# Patient Record
Sex: Female | Born: 1935
Health system: Southern US, Community
[De-identification: ages and names within clinical notes are randomized; demographics above are authoritative.]

## PROBLEM LIST (undated history)

## (undated) DIAGNOSIS — N959 Unspecified menopausal and perimenopausal disorder: Secondary | ICD-10-CM

## (undated) DIAGNOSIS — IMO0002 Reserved for concepts with insufficient information to code with codable children: Secondary | ICD-10-CM

## (undated) DIAGNOSIS — E119 Type 2 diabetes mellitus without complications: Secondary | ICD-10-CM

## (undated) DIAGNOSIS — M25519 Pain in unspecified shoulder: Secondary | ICD-10-CM

## (undated) DIAGNOSIS — M545 Low back pain: Secondary | ICD-10-CM

## (undated) DIAGNOSIS — J019 Acute sinusitis, unspecified: Secondary | ICD-10-CM

## (undated) DIAGNOSIS — F411 Generalized anxiety disorder: Secondary | ICD-10-CM

## (undated) DIAGNOSIS — K8020 Calculus of gallbladder without cholecystitis without obstruction: Secondary | ICD-10-CM

## (undated) DIAGNOSIS — K5730 Diverticulosis of large intestine without perforation or abscess without bleeding: Secondary | ICD-10-CM

## (undated) DIAGNOSIS — E785 Hyperlipidemia, unspecified: Secondary | ICD-10-CM

## (undated) DIAGNOSIS — Z86010 Personal history of colonic polyps: Secondary | ICD-10-CM

## (undated) DIAGNOSIS — I639 Cerebral infarction, unspecified: Secondary | ICD-10-CM

## (undated) DIAGNOSIS — I10 Essential (primary) hypertension: Secondary | ICD-10-CM

## (undated) DIAGNOSIS — D509 Iron deficiency anemia, unspecified: Secondary | ICD-10-CM

## (undated) DIAGNOSIS — K219 Gastro-esophageal reflux disease without esophagitis: Secondary | ICD-10-CM

## (undated) DIAGNOSIS — R233 Spontaneous ecchymoses: Secondary | ICD-10-CM

## (undated) DIAGNOSIS — M810 Age-related osteoporosis without current pathological fracture: Secondary | ICD-10-CM

## (undated) DIAGNOSIS — R011 Cardiac murmur, unspecified: Secondary | ICD-10-CM

## (undated) HISTORY — DX: Personal history of colonic polyps: Z86.010

## (undated) HISTORY — DX: Gastro-esophageal reflux disease without esophagitis: K21.9

## (undated) HISTORY — DX: Type 2 diabetes mellitus without complications: E11.9

## (undated) HISTORY — DX: Cardiac murmur, unspecified: R01.1

## (undated) HISTORY — DX: Unspecified menopausal and perimenopausal disorder: N95.9

## (undated) HISTORY — PX: TONSILLECTOMY: SHX5217

## (undated) HISTORY — DX: Diverticulosis of large intestine without perforation or abscess without bleeding: K57.30

## (undated) HISTORY — DX: Generalized anxiety disorder: F41.1

## (undated) HISTORY — DX: Pain in unspecified shoulder: M25.519

## (undated) HISTORY — DX: Low back pain: M54.5

## (undated) HISTORY — DX: Hyperlipidemia, unspecified: E78.5

## (undated) HISTORY — DX: Reserved for concepts with insufficient information to code with codable children: IMO0002

## (undated) HISTORY — DX: Spontaneous ecchymoses: R23.3

## (undated) HISTORY — DX: Iron deficiency anemia, unspecified: D50.9

## (undated) HISTORY — PX: APPENDECTOMY: SHX54

## (undated) HISTORY — DX: Essential (primary) hypertension: I10

## (undated) HISTORY — DX: Calculus of gallbladder without cholecystitis without obstruction: K80.20

## (undated) HISTORY — DX: Cerebral infarction, unspecified: I63.9

## (undated) HISTORY — DX: Age-related osteoporosis without current pathological fracture: M81.0

## (undated) HISTORY — PX: ABDOMINAL HYSTERECTOMY: SHX81

## (undated) HISTORY — DX: Acute sinusitis, unspecified: J01.90

---

## 1978-05-25 HISTORY — PX: OTHER SURGICAL HISTORY: SHX169

## 1983-05-26 HISTORY — PX: OTHER SURGICAL HISTORY: SHX169

## 1993-05-25 HISTORY — PX: OTHER SURGICAL HISTORY: SHX169

## 1998-05-16 ENCOUNTER — Encounter: Payer: Self-pay | Admitting: Internal Medicine

## 1998-05-16 ENCOUNTER — Ambulatory Visit (HOSPITAL_COMMUNITY): Admission: RE | Admit: 1998-05-16 | Discharge: 1998-05-16 | Payer: Self-pay | Admitting: Internal Medicine

## 2004-01-29 ENCOUNTER — Encounter: Payer: Self-pay | Admitting: Gastroenterology

## 2004-03-04 ENCOUNTER — Encounter: Admission: RE | Admit: 2004-03-04 | Discharge: 2004-03-04 | Payer: Self-pay | Admitting: Internal Medicine

## 2004-03-27 ENCOUNTER — Ambulatory Visit: Payer: Self-pay | Admitting: Internal Medicine

## 2004-04-16 ENCOUNTER — Ambulatory Visit: Payer: Self-pay | Admitting: Gastroenterology

## 2004-05-05 ENCOUNTER — Ambulatory Visit: Payer: Self-pay | Admitting: Internal Medicine

## 2004-05-25 LAB — HM MAMMOGRAPHY: HM Mammogram: NORMAL

## 2005-04-22 ENCOUNTER — Ambulatory Visit: Payer: Self-pay | Admitting: Internal Medicine

## 2005-06-03 ENCOUNTER — Ambulatory Visit: Payer: Self-pay | Admitting: Internal Medicine

## 2005-06-11 ENCOUNTER — Encounter: Admission: RE | Admit: 2005-06-11 | Discharge: 2005-06-11 | Payer: Self-pay | Admitting: Internal Medicine

## 2005-07-22 ENCOUNTER — Ambulatory Visit: Payer: Self-pay | Admitting: Internal Medicine

## 2006-08-25 ENCOUNTER — Ambulatory Visit: Payer: Self-pay | Admitting: Internal Medicine

## 2006-08-25 LAB — CONVERTED CEMR LAB
ALT: 17 units/L (ref 0–40)
AST: 15 units/L (ref 0–37)
Albumin: 3.4 g/dL — ABNORMAL LOW (ref 3.5–5.2)
BUN: 14 mg/dL (ref 6–23)
Bacteria, UA: NEGATIVE
Basophils Absolute: 0.1 10*3/uL (ref 0.0–0.1)
Bilirubin, Direct: 0.1 mg/dL (ref 0.0–0.3)
Calcium: 9.2 mg/dL (ref 8.4–10.5)
Chloride: 105 meq/L (ref 96–112)
Direct LDL: 160.9 mg/dL
Eosinophils Absolute: 0.2 10*3/uL (ref 0.0–0.6)
GFR calc Af Amer: 91 mL/min
GFR calc non Af Amer: 76 mL/min
Glucose, Bld: 93 mg/dL (ref 70–99)
HCT: 41.5 % (ref 36.0–46.0)
HDL: 45.8 mg/dL (ref >39.0)
Hgb A1c MFr Bld: 6.5 % — ABNORMAL HIGH (ref 4.6–6.0)
Ketones, ur: NEGATIVE mg/dL
MCHC: 33.7 g/dL (ref 30.0–36.0)
MCV: 89.9 fL (ref 78.0–100.0)
Microalb Creat Ratio: 3.3 mg/g (ref 0.0–30.0)
Neutrophils Relative %: 51.7 % (ref 43.0–77.0)
Platelets: 278 10*3/uL (ref 150–400)
RBC / HPF: NONE SEEN
RBC: 4.61 M/uL (ref 3.87–5.11)
RDW: 12.6 % (ref 11.5–14.6)
Specific Gravity, Urine: 1.02 (ref 1.000–1.03)
Total CHOL/HDL Ratio: 5
Total Protein, Urine: NEGATIVE mg/dL
Triglycerides: 193 mg/dL — ABNORMAL HIGH (ref 0–149)
WBC: 7.1 10*3/uL (ref 4.5–10.5)
pH: 6 (ref 5.0–8.0)

## 2007-01-26 ENCOUNTER — Encounter: Payer: Self-pay | Admitting: Internal Medicine

## 2007-01-26 DIAGNOSIS — I10 Essential (primary) hypertension: Secondary | ICD-10-CM | POA: Insufficient documentation

## 2007-01-26 DIAGNOSIS — K5730 Diverticulosis of large intestine without perforation or abscess without bleeding: Secondary | ICD-10-CM | POA: Insufficient documentation

## 2007-01-26 DIAGNOSIS — Z860100 Personal history of colon polyps, unspecified: Secondary | ICD-10-CM

## 2007-01-26 DIAGNOSIS — K219 Gastro-esophageal reflux disease without esophagitis: Secondary | ICD-10-CM | POA: Insufficient documentation

## 2007-01-26 DIAGNOSIS — IMO0002 Reserved for concepts with insufficient information to code with codable children: Secondary | ICD-10-CM

## 2007-01-26 HISTORY — DX: Essential (primary) hypertension: I10

## 2007-01-26 HISTORY — DX: Personal history of colonic polyps: Z86.010

## 2007-01-26 HISTORY — DX: Diverticulosis of large intestine without perforation or abscess without bleeding: K57.30

## 2007-01-26 HISTORY — DX: Gastro-esophageal reflux disease without esophagitis: K21.9

## 2007-01-26 HISTORY — DX: Personal history of colon polyps, unspecified: Z86.0100

## 2007-01-26 HISTORY — DX: Reserved for concepts with insufficient information to code with codable children: IMO0002

## 2007-01-29 DIAGNOSIS — M545 Low back pain, unspecified: Secondary | ICD-10-CM

## 2007-01-29 HISTORY — DX: Low back pain, unspecified: M54.50

## 2007-08-31 ENCOUNTER — Encounter: Payer: Self-pay | Admitting: Internal Medicine

## 2007-10-06 ENCOUNTER — Encounter (INDEPENDENT_AMBULATORY_CARE_PROVIDER_SITE_OTHER): Payer: Self-pay | Admitting: *Deleted

## 2007-10-06 ENCOUNTER — Ambulatory Visit: Payer: Self-pay | Admitting: Internal Medicine

## 2007-10-06 DIAGNOSIS — E119 Type 2 diabetes mellitus without complications: Secondary | ICD-10-CM

## 2007-10-06 DIAGNOSIS — M25519 Pain in unspecified shoulder: Secondary | ICD-10-CM

## 2007-10-06 DIAGNOSIS — K8020 Calculus of gallbladder without cholecystitis without obstruction: Secondary | ICD-10-CM | POA: Insufficient documentation

## 2007-10-06 DIAGNOSIS — E785 Hyperlipidemia, unspecified: Secondary | ICD-10-CM

## 2007-10-06 DIAGNOSIS — F411 Generalized anxiety disorder: Secondary | ICD-10-CM | POA: Insufficient documentation

## 2007-10-06 HISTORY — DX: Hyperlipidemia, unspecified: E78.5

## 2007-10-06 HISTORY — DX: Generalized anxiety disorder: F41.1

## 2007-10-06 HISTORY — DX: Type 2 diabetes mellitus without complications: E11.9

## 2007-10-06 HISTORY — DX: Calculus of gallbladder without cholecystitis without obstruction: K80.20

## 2007-10-06 HISTORY — DX: Pain in unspecified shoulder: M25.519

## 2007-10-07 LAB — CONVERTED CEMR LAB
ALT: 16 units/L (ref 0–35)
AST: 17 units/L (ref 0–37)
Albumin: 3.6 g/dL (ref 3.5–5.2)
BUN: 12 mg/dL (ref 6–23)
Basophils Relative: 0.8 % (ref 0.0–1.0)
CO2: 32 meq/L (ref 19–32)
Chloride: 105 meq/L (ref 96–112)
Cholesterol: 174 mg/dL (ref 0–200)
Creatinine, Ser: 0.9 mg/dL (ref 0.4–1.2)
Creatinine,U: 22.5 mg/dL
Eosinophils Relative: 2.8 % (ref 0.0–5.0)
Glucose, Bld: 99 mg/dL (ref 70–99)
Hemoglobin: 13.6 g/dL (ref 12.0–15.0)
LDL Cholesterol: 108 mg/dL — ABNORMAL HIGH (ref 0–99)
Leukocytes, UA: NEGATIVE
Lymphocytes Relative: 32.5 % (ref 12.0–46.0)
Monocytes Relative: 10.1 % (ref 3.0–12.0)
Neutro Abs: 3.4 10*3/uL (ref 1.4–7.7)
Neutrophils Relative %: 53.8 % (ref 43.0–77.0)
RBC: 4.47 M/uL (ref 3.87–5.11)
Specific Gravity, Urine: <=1.005 (ref 1.000–1.03)
TSH: 0.91 microintl units/mL (ref 0.35–5.50)
Total Bilirubin: 0.6 mg/dL (ref 0.3–1.2)
Total CHOL/HDL Ratio: 4
Total Protein: 6.8 g/dL (ref 6.0–8.3)
Triglycerides: 111 mg/dL (ref 0–149)
Urine Glucose: NEGATIVE mg/dL
Urobilinogen, UA: 0.2 (ref 0.0–1.0)
WBC: 6.3 10*3/uL (ref 4.5–10.5)
pH: 7 (ref 5.0–8.0)

## 2007-10-23 ENCOUNTER — Encounter: Admission: RE | Admit: 2007-10-23 | Discharge: 2007-10-23 | Payer: Self-pay | Admitting: Internal Medicine

## 2007-11-01 ENCOUNTER — Ambulatory Visit: Payer: Self-pay | Admitting: Gastroenterology

## 2007-11-15 ENCOUNTER — Encounter: Payer: Self-pay | Admitting: Gastroenterology

## 2007-11-15 ENCOUNTER — Ambulatory Visit: Payer: Self-pay | Admitting: Gastroenterology

## 2007-11-15 LAB — HM COLONOSCOPY

## 2007-11-18 ENCOUNTER — Encounter: Payer: Self-pay | Admitting: Gastroenterology

## 2008-03-01 ENCOUNTER — Ambulatory Visit: Payer: Self-pay | Admitting: Internal Medicine

## 2008-04-25 ENCOUNTER — Ambulatory Visit: Payer: Self-pay | Admitting: Internal Medicine

## 2008-04-25 LAB — CONVERTED CEMR LAB
CO2: 33 meq/L — ABNORMAL HIGH (ref 19–32)
Calcium: 9.3 mg/dL (ref 8.4–10.5)
Creatinine, Ser: 1.6 mg/dL — ABNORMAL HIGH (ref 0.4–1.2)
HDL: 47.3 mg/dL (ref >39.0)
Hgb A1c MFr Bld: 6.1 % — ABNORMAL HIGH (ref 4.6–6.0)
Total CHOL/HDL Ratio: 2.8
Triglycerides: 69 mg/dL (ref 0–149)
VLDL: 14 mg/dL (ref 0–40)

## 2008-04-26 ENCOUNTER — Ambulatory Visit: Payer: Self-pay | Admitting: Internal Medicine

## 2008-04-26 DIAGNOSIS — J069 Acute upper respiratory infection, unspecified: Secondary | ICD-10-CM | POA: Insufficient documentation

## 2008-05-25 HISTORY — PX: OTHER SURGICAL HISTORY: SHX169

## 2008-05-28 ENCOUNTER — Ambulatory Visit: Payer: Self-pay | Admitting: Internal Medicine

## 2008-05-28 DIAGNOSIS — J019 Acute sinusitis, unspecified: Secondary | ICD-10-CM

## 2008-05-28 HISTORY — DX: Acute sinusitis, unspecified: J01.90

## 2008-10-11 ENCOUNTER — Emergency Department (HOSPITAL_COMMUNITY): Admission: EM | Admit: 2008-10-11 | Discharge: 2008-10-12 | Payer: Self-pay | Admitting: Emergency Medicine

## 2008-10-15 ENCOUNTER — Encounter: Payer: Self-pay | Admitting: Internal Medicine

## 2008-10-25 ENCOUNTER — Ambulatory Visit: Payer: Self-pay | Admitting: Internal Medicine

## 2008-10-25 LAB — CONVERTED CEMR LAB
ALT: 16 units/L (ref 0–35)
AST: 18 units/L (ref 0–37)
Albumin: 3.6 g/dL (ref 3.5–5.2)
Basophils Relative: 0 % (ref 0.0–3.0)
Bilirubin Urine: NEGATIVE
Creatinine,U: 104 mg/dL
Eosinophils Relative: 2.6 % (ref 0.0–5.0)
GFR calc non Af Amer: 58 mL/min (ref >60)
Glucose, Bld: 82 mg/dL (ref 70–99)
HCT: 33.4 % — ABNORMAL LOW (ref 36.0–46.0)
Hemoglobin: 11.4 g/dL — ABNORMAL LOW (ref 12.0–15.0)
Ketones, ur: NEGATIVE mg/dL
LDL Cholesterol: 58 mg/dL (ref 0–99)
Leukocytes, UA: NEGATIVE
Lymphs Abs: 1.6 10*3/uL (ref 0.7–4.0)
Monocytes Relative: 7.4 % (ref 3.0–12.0)
Neutro Abs: 5.6 10*3/uL (ref 1.4–7.7)
Potassium: 4.6 meq/L (ref 3.5–5.1)
Saturation Ratios: 7.2 % — ABNORMAL LOW (ref 20.0–50.0)
Sodium: 138 meq/L (ref 135–145)
TSH: 1.35 microintl units/mL (ref 0.35–5.50)
Transferrin: 305.5 mg/dL (ref 212.0–360.0)
Urobilinogen, UA: 0.2 (ref 0.0–1.0)
VLDL: 18.2 mg/dL (ref 0.0–40.0)
WBC: 8 10*3/uL (ref 4.5–10.5)
pH: 5 (ref 5.0–8.0)

## 2008-10-26 ENCOUNTER — Ambulatory Visit: Payer: Self-pay | Admitting: Internal Medicine

## 2008-10-26 DIAGNOSIS — D509 Iron deficiency anemia, unspecified: Secondary | ICD-10-CM | POA: Insufficient documentation

## 2008-10-26 DIAGNOSIS — N959 Unspecified menopausal and perimenopausal disorder: Secondary | ICD-10-CM

## 2008-10-26 HISTORY — DX: Unspecified menopausal and perimenopausal disorder: N95.9

## 2008-10-26 HISTORY — DX: Iron deficiency anemia, unspecified: D50.9

## 2008-11-29 ENCOUNTER — Ambulatory Visit: Payer: Self-pay | Admitting: Gastroenterology

## 2008-12-04 ENCOUNTER — Encounter: Payer: Self-pay | Admitting: Gastroenterology

## 2008-12-04 ENCOUNTER — Ambulatory Visit: Payer: Self-pay | Admitting: Gastroenterology

## 2008-12-04 ENCOUNTER — Encounter: Payer: Self-pay | Admitting: Internal Medicine

## 2008-12-04 LAB — CONVERTED CEMR LAB
Basophils Absolute: 0 10*3/uL (ref 0.0–0.1)
Hemoglobin: 11.8 g/dL — ABNORMAL LOW (ref 12.0–15.0)
Lymphocytes Relative: 32 % (ref 12.0–46.0)
Monocytes Relative: 6.7 % (ref 3.0–12.0)
Neutro Abs: 3.2 10*3/uL (ref 1.4–7.7)
Neutrophils Relative %: 57.7 % (ref 43.0–77.0)
RDW: 13.2 % (ref 11.5–14.6)

## 2008-12-06 ENCOUNTER — Ambulatory Visit: Payer: Self-pay | Admitting: Gastroenterology

## 2008-12-06 LAB — CONVERTED CEMR LAB: Fecal Occult Bld: NEGATIVE

## 2009-03-05 ENCOUNTER — Ambulatory Visit: Payer: Self-pay | Admitting: Internal Medicine

## 2009-05-16 ENCOUNTER — Ambulatory Visit: Payer: Self-pay | Admitting: Internal Medicine

## 2009-05-16 LAB — CONVERTED CEMR LAB
BUN: 27 mg/dL — ABNORMAL HIGH (ref 6–23)
Chloride: 105 meq/L (ref 96–112)
Cholesterol: 134 mg/dL (ref 0–200)
HDL: 51.7 mg/dL (ref >39.00)
LDL Cholesterol: 67 mg/dL (ref 0–99)
Potassium: 5.5 meq/L — ABNORMAL HIGH (ref 3.5–5.1)
Sodium: 142 meq/L (ref 135–145)
Triglycerides: 78 mg/dL (ref 0.0–149.0)
VLDL: 15.6 mg/dL (ref 0.0–40.0)

## 2009-05-21 ENCOUNTER — Ambulatory Visit: Payer: Self-pay | Admitting: Internal Medicine

## 2009-11-22 ENCOUNTER — Ambulatory Visit: Payer: Self-pay | Admitting: Internal Medicine

## 2009-11-22 LAB — CONVERTED CEMR LAB
AST: 16 units/L (ref 0–37)
Albumin: 3.5 g/dL (ref 3.5–5.2)
Alkaline Phosphatase: 64 units/L (ref 39–117)
BUN: 21 mg/dL (ref 6–23)
Basophils Absolute: 0 10*3/uL (ref 0.0–0.1)
Bilirubin Urine: NEGATIVE
CO2: 29 meq/L (ref 19–32)
Calcium: 9.4 mg/dL (ref 8.4–10.5)
Creatinine, Ser: 1 mg/dL (ref 0.4–1.2)
Eosinophils Absolute: 0.2 10*3/uL (ref 0.0–0.7)
GFR calc non Af Amer: 61.35 mL/min (ref >60)
Glucose, Bld: 86 mg/dL (ref 70–99)
HDL: 40.1 mg/dL (ref >39.00)
Hemoglobin, Urine: NEGATIVE
Hgb A1c MFr Bld: 6.2 % (ref 4.6–6.5)
Iron: 60 ug/dL (ref 42–145)
Lymphocytes Relative: 34.4 % (ref 12.0–46.0)
MCHC: 33.5 g/dL (ref 30.0–36.0)
Microalb Creat Ratio: 0.7 mg/g (ref 0.0–30.0)
Monocytes Relative: 10 % (ref 3.0–12.0)
Nitrite: NEGATIVE
Platelets: 226 10*3/uL (ref 150.0–400.0)
RDW: 14.7 % — ABNORMAL HIGH (ref 11.5–14.6)
Saturation Ratios: 14.6 % — ABNORMAL LOW (ref 20.0–50.0)
TSH: 1.11 microintl units/mL (ref 0.35–5.50)
Total Bilirubin: 0.3 mg/dL (ref 0.3–1.2)
Total Protein, Urine: NEGATIVE mg/dL
Triglycerides: 121 mg/dL (ref 0.0–149.0)
Urobilinogen, UA: 0.2 (ref 0.0–1.0)
VLDL: 24.2 mg/dL (ref 0.0–40.0)

## 2009-11-29 ENCOUNTER — Ambulatory Visit: Payer: Self-pay | Admitting: Internal Medicine

## 2009-12-02 ENCOUNTER — Ambulatory Visit: Payer: Self-pay | Admitting: Internal Medicine

## 2009-12-17 ENCOUNTER — Encounter: Payer: Self-pay | Admitting: Internal Medicine

## 2009-12-18 ENCOUNTER — Telehealth: Payer: Self-pay | Admitting: Internal Medicine

## 2010-04-21 ENCOUNTER — Telehealth: Payer: Self-pay | Admitting: Internal Medicine

## 2010-06-02 ENCOUNTER — Ambulatory Visit
Admission: RE | Admit: 2010-06-02 | Discharge: 2010-06-02 | Payer: Self-pay | Source: Home / Self Care | Attending: Internal Medicine | Admitting: Internal Medicine

## 2010-06-02 ENCOUNTER — Other Ambulatory Visit: Payer: Self-pay | Admitting: Internal Medicine

## 2010-06-02 LAB — BASIC METABOLIC PANEL
BUN: 29 mg/dL — ABNORMAL HIGH (ref 6–23)
CO2: 28 mEq/L (ref 19–32)
Calcium: 9.4 mg/dL (ref 8.4–10.5)
Chloride: 107 mEq/L (ref 96–112)
Creatinine, Ser: 1 mg/dL (ref 0.4–1.2)
GFR: 59.1 mL/min — ABNORMAL LOW (ref >60.00)
Glucose, Bld: 86 mg/dL (ref 70–99)
Potassium: 6 mEq/L — ABNORMAL HIGH (ref 3.5–5.1)
Sodium: 143 mEq/L (ref 135–145)

## 2010-06-02 LAB — LIPID PANEL
Cholesterol: 151 mg/dL (ref 0–200)
HDL: 46.1 mg/dL (ref >39.00)
LDL Cholesterol: 86 mg/dL (ref 0–99)
Total CHOL/HDL Ratio: 3
Triglycerides: 96 mg/dL (ref 0.0–149.0)
VLDL: 19.2 mg/dL (ref 0.0–40.0)

## 2010-06-02 LAB — HEMOGLOBIN A1C: Hgb A1c MFr Bld: 6.3 % (ref 4.6–6.5)

## 2010-06-04 ENCOUNTER — Ambulatory Visit
Admission: RE | Admit: 2010-06-04 | Discharge: 2010-06-04 | Payer: Self-pay | Source: Home / Self Care | Attending: Internal Medicine | Admitting: Internal Medicine

## 2010-06-04 DIAGNOSIS — M810 Age-related osteoporosis without current pathological fracture: Secondary | ICD-10-CM

## 2010-06-04 DIAGNOSIS — R233 Spontaneous ecchymoses: Secondary | ICD-10-CM | POA: Insufficient documentation

## 2010-06-04 HISTORY — DX: Age-related osteoporosis without current pathological fracture: M81.0

## 2010-06-04 HISTORY — DX: Spontaneous ecchymoses: R23.3

## 2010-06-05 ENCOUNTER — Telehealth: Payer: Self-pay | Admitting: Internal Medicine

## 2010-06-10 ENCOUNTER — Encounter: Payer: Self-pay | Admitting: Internal Medicine

## 2010-06-15 ENCOUNTER — Encounter: Payer: Self-pay | Admitting: Internal Medicine

## 2010-06-24 NOTE — Progress Notes (Signed)
Summary: Prolia approval  Phone Note Outgoing Call Call back at Home Phone [phone]   Call placed by: Lucious Groves CMA,  December 18, 2009 11:55 AM Call placed to: Patient Summary of Call: Prolia has been approved and patient responsibility is 20% (approx $175) and $10 office visit copay. Patient notified and would like to do some research/discuss with a friend. She will call back for appt. Initial call taken by: Lucious Groves CMA,  December 18, 2009 11:55 AM  Follow-up for Phone Call        noted Follow-up by: [name] MD,  December 18, 2009 12:59 PM

## 2010-06-24 NOTE — Assessment & Plan Note (Signed)
Summary: 6 MOS F/U #/CD   RS'D PER PT/NWS   Vital Signs:  Patient profile:   76 year old female Height:      63 inches Weight:      215 pounds BMI:     38.22 O2 Sat:      96 % on Room air Temp:     96.6 degrees F oral Pulse rate:   77 / minute BP sitting:   116 / 68  (left arm) Cuff size:   large  Vitals Entered By: Bill Salinas CMA (November 29, 2009 1:37 PM)  O2 Flow:  Room air   Primary Care Provider:  Oliver Barre, MD   History of Present Illness: overall doing well;  Pt denies CP, sob, doe, wheezing, orthopnea, pnd, worsening LE edema, palps, dizziness or syncope Pt denies new neuro symptoms such as headache, facial or extremity weakness   Pt denies polydipsia, polyuria, or low sugar symptoms such as shakiness improved with eating.  Overall good compliance with meds, trying to follow low chol, DM diet, wt stable, little excercise however   Problems Prior to Update: 1)  Menopausal Disorder  (ICD-627.9) 2)  Anemia-iron Deficiency  (ICD-280.9) 3)  Acute Sinusitis, Unspecified  (ICD-461.9) 4)  Uri  (ICD-465.9) 5)  Shoulder Pain, Left  (ICD-719.41) 6)  Diabetes Mellitus, Type II  (ICD-250.00) 7)  Preventive Health Care  (ICD-V70.0) 8)  Anxiety  (ICD-300.00) 9)  Hyperlipidemia  (ICD-272.4) 10)  Cholelithiasis  (ICD-574.20) 11)  Low Back Pain  (ICD-724.2) 12)  Herniated Disc  (ICD-722.2) 13)  Gerd  (ICD-530.81) 14)  Diverticulosis, Colon  (ICD-562.10) 15)  Colonic Polyps, Hx of  (ICD-V12.72) 16)  Hypertension  (ICD-401.9)  Medications Prior to Update: 1)  Simvastatin 80 Mg  Tabs (Simvastatin) .Marland Kitchen.. 1 By Mouth Once Daily 2)  Actoplus Met 15-500 Mg  Tabs (Pioglitazone Hcl-Metformin Hcl) .Marland Kitchen.. 1 By Mouth Two Times A Day 3)  Lisinopril-Hydrochlorothiazide 20-12.5 Mg  Tabs (Lisinopril-Hydrochlorothiazide) .... 2 By Mouth Once Daily 4)  Adult Aspirin Ec Low Strength 81 Mg  Tbec (Aspirin) .Marland Kitchen.. 1 By Mouth Qd 5)  Calcium 1200-1000 Mg-Unit Chew (Calcium Carbonate-Vit D-Min) .... One  Tablet By Mouth Once Daily 6)  Vitamin C 1000 Mg Tabs (Ascorbic Acid) .... One Tablet By Mouth Once Daily 7)  Vicodin 5-500 Mg Tabs (Hydrocodone-Acetaminophen) .Marland Kitchen.. 1 By Mouth Every 4-6 Hours As Needed 8)  Pantoprazole Sodium 40 Mg Tbec (Pantoprazole Sodium) .Marland Kitchen.. 1po Once Daily 9)  Temazepam 15 Mg Caps (Temazepam) .... One Tablet By Mouth Once Daily 10)  Iron 325 (65 Fe) Mg Tabs (Ferrous Sulfate) .... One Tablet By Mouth Once Daily  Current Medications (verified): 1)  Simvastatin 40 Mg Tabs (Simvastatin) .Marland Kitchen.. 1 By Mouth Once Daily 2)  Actoplus Met 15-500 Mg  Tabs (Pioglitazone Hcl-Metformin Hcl) .Marland Kitchen.. 1 By Mouth Two Times A Day 3)  Lisinopril-Hydrochlorothiazide 20-12.5 Mg  Tabs (Lisinopril-Hydrochlorothiazide) .... 2 By Mouth Once Daily 4)  Adult Aspirin Ec Low Strength 81 Mg  Tbec (Aspirin) .Marland Kitchen.. 1 By Mouth Qd 5)  Calcium 1200-1000 Mg-Unit Chew (Calcium Carbonate-Vit D-Min) .... One Tablet By Mouth Once Daily 6)  Temazepam 15 Mg Caps (Temazepam) .... One Tablet By Mouth Once Daily  Allergies (verified): 1)  Pcn 2)  Morphine  Past History:  Past Medical History: Last updated: 11/29/2008 Hypertension Colonic polyps, hx of Diverticulosis, colon GERD Herniated Disc- C Spine and Lumbar Low back pain Cholelithiasis Hyperlipidemia c-spine disc disease lumbar disc disease Anxiety Diabetes mellitus, type II Anemia-iron  deficiency  Past Surgical History: Last updated: 05/21/2009 Hysterectomy Appendectomy Tonsillectomy L Elbow- Nerve Impingement- 1985 L Ankle- Screw Placement- 1980 Herniated Disk- 1995 - c-spine s/p fracture left wrist 2010 - dr Amanda Pea  Family History: Last updated: 11/29/2008 ETOH dependence elev cholesterol heart disease: Father  stroke HTN DM: Multiple family members brother died pneumonia No FH of Colon Cancer:  Social History: Last updated: 11/29/2008 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   Risk Factors: Exercise: no (11/29/2008)  Risk Factors: Smoking Status: current (05/28/2008) Packs/Day: 1/2 (05/28/2008) Cans of tobacco/wk: no (05/28/2008) Passive Smoke Exposure: yes (05/28/2008)  Review of Systems  The patient denies anorexia, fever, vision loss, decreased hearing, hoarseness, chest pain, syncope, dyspnea on exertion, peripheral edema, prolonged cough, headaches, hemoptysis, abdominal pain, melena, hematochezia, severe indigestion/heartburn, hematuria, muscle weakness, suspicious skin lesions, transient blindness, difficulty walking, depression, unusual weight change, abnormal bleeding, enlarged lymph nodes, and angioedema.         all otherwise negative per pt -  except for now chronic left grip strength slight loss after preovious fracture arm  Physical Exam  General:  alert and overweight-appearing.   Head:  normocephalic and atraumatic.   Eyes:  vision grossly intact, pupils equal, and pupils round.   Ears:  R ear normal and L ear normal.   Nose:  no external deformity and no nasal discharge.   Mouth:  no gingival abnormalities and pharynx pink and moist.   Neck:  supple and no masses.   Lungs:  normal respiratory effort and normal breath sounds.   Heart:  normal rate and regular rhythm.   Abdomen:  soft, non-tender, and normal bowel sounds.   Msk:  no joint tenderness and no joint swelling.   Extremities:  no edema, no erythema  Neurologic:  cranial nerves II-XII intact and strength normal in all extremities.   Skin:  color normal and no rashes.   Psych:  not depressed appearing and slightly anxious.     Impression & Recommendations:  Problem # 1:  Preventive Health Care (ICD-V70.0) Overall doing well, age appropriate education and counseling updated and referral for appropriate preventive services done unless declined, immunizations up to date or declined, diet counseling done if  overweight, urged to quit smoking if smokes , most recent labs reviewed and current ordered if appropriate, ecg reviewed or declined (interpretation per ECG scanned in the EMR if done); information regarding Medicare Prevention requirements given if appropriate; speciality referrals updated as appropriate   Problem # 2:  DIABETES MELLITUS, TYPE II (ICD-250.00)  Her updated medication list for this problem includes:    Actoplus Met 15-500 Mg Tabs (Pioglitazone hcl-metformin hcl) .Marland Kitchen... 1 by mouth two times a day    Lisinopril-hydrochlorothiazide 20-12.5 Mg Tabs (Lisinopril-hydrochlorothiazide) .Marland Kitchen... 2 by mouth once daily    Adult Aspirin Ec Low Strength 81 Mg Tbec (Aspirin) .Marland Kitchen... 1 by mouth qd  Labs Reviewed: Creat: 1.0 (11/22/2009)    Reviewed HgBA1c results: 6.2 (11/22/2009)  6.2 (05/16/2009) stable overall by hx and exam, ok to continue meds/tx as is   Problem # 3:  HYPERLIPIDEMIA (ICD-272.4)  Her updated medication list for this problem includes:    Simvastatin 40 Mg Tabs (Simvastatin) .Marland Kitchen... 1 by mouth once daily  Labs Reviewed: SGOT: 16 (11/22/2009)   SGPT: 10 (11/22/2009)   HDL:40.10 (11/22/2009), 51.70 (05/16/2009)  LDL:67 (11/22/2009), 67 (05/16/2009)  Chol:131 (11/22/2009), 134 (05/16/2009)  Trig:121.0 (11/22/2009), 78.0 (05/16/2009) stable overall by hx and exam, ok to continue meds/tx as is , but to decr the zocor to 40 mg due to recent FDA warning about the 80 mg  Problem # 4:  HYPERTENSION (ICD-401.9)  Her updated medication list for this problem includes:    Lisinopril-hydrochlorothiazide 20-12.5 Mg Tabs (Lisinopril-hydrochlorothiazide) .Marland Kitchen... 2 by mouth once daily  BP today: 116/68 Prior BP: 130/70 (05/21/2009)  Labs Reviewed: K+: 5.7 (11/22/2009) Creat: : 1.0 (11/22/2009)   Chol: 131 (11/22/2009)   HDL: 40.10 (11/22/2009)   LDL: 67 (11/22/2009)   TG: 121.0 (11/22/2009) stable overall by hx and exam, ok to continue meds/tx as is   Complete Medication List: 1)   Simvastatin 40 Mg Tabs (Simvastatin) .Marland Kitchen.. 1 by mouth once daily 2)  Actoplus Met 15-500 Mg Tabs (Pioglitazone hcl-metformin hcl) .Marland Kitchen.. 1 by mouth two times a day 3)  Lisinopril-hydrochlorothiazide 20-12.5 Mg Tabs (Lisinopril-hydrochlorothiazide) .... 2 by mouth once daily 4)  Adult Aspirin Ec Low Strength 81 Mg Tbec (Aspirin) .Marland Kitchen.. 1 by mouth qd 5)  Calcium 1200-1000 Mg-unit Chew (Calcium carbonate-vit d-min) .... One tablet by mouth once daily 6)  Temazepam 15 Mg Caps (Temazepam) .... One tablet by mouth once daily  Other Orders: T-Bone Densitometry [id number])  Patient Instructions: 1)  decrease the simvastatin to 40 mg per day 2)  Continue all previous medications as before this visit  3)  please schedule your bone density test before leaving today 4)  Please schedule a follow-up appointment in 6 months  with: 5)  BMP prior to visit, ICD-9: 250.02 6)  Lipid Panel prior to visit, ICD-9: 7)  HbgA1C prior to visit, ICD-9: Prescriptions: SIMVASTATIN 40 MG TABS (SIMVASTATIN) 1 by mouth once daily  #90 x 3   Entered and Authorized by:   [name] MD   Signed by:   [name] MD on 11/29/2009   Method used:   Print then Give to Patient   RxID:   [id number]

## 2010-06-24 NOTE — Progress Notes (Signed)
  Phone Note Refill Request Message from:  Fax from Pharmacy on April 21, 2010 4:55 PM  Refills Requested: Medication #1:  ACTOPLUS MET 15-500 MG  TABS 1 by mouth two times a day   Dosage confirmed as above?Dosage Confirmed   Last Refilled: 10/2009   Notes: [facility]  Medication #2:  LISINOPRIL-HYDROCHLOROTHIAZIDE 20-12.5 MG  TABS 2 by mouth once daily   Dosage confirmed as above?Dosage Confirmed   Last Refilled: 10/2009   Notes: [facility] Initial call taken by: Robin Ewing CMA (AAMA),  April 21, 2010 4:55 PM    Prescriptions: LISINOPRIL-HYDROCHLOROTHIAZIDE 20-12.5 MG  TABS (LISINOPRIL-HYDROCHLOROTHIAZIDE) 2 by mouth once daily  #180 Tablet x 1   Entered by:   Scharlene Gloss CMA (AAMA)   Authorized by:   [name] MD   Signed by:   Scharlene Gloss CMA (AAMA) on 04/21/2010   Method used:   Faxed to ...       [facility] [phone]* (retail)       [address] [place], [place]  [address]       Ph: [phone]       Fax: [phone]   RxID:   [id number] ACTOPLUS MET 15-500 MG  TABS (PIOGLITAZONE HCL-METFORMIN HCL) 1 by mouth two times a day  #180 Tablet x 1   Entered by:   Scharlene Gloss CMA (AAMA)   Authorized by:   [name] MD   Signed by:   Scharlene Gloss CMA (AAMA) on 04/21/2010   Method used:   Faxed to ...       [facility] [phone]* (retail)       [address] [place], [place]  [address]       Ph: [phone]       Fax: [phone]   RxID:   [id number]

## 2010-06-24 NOTE — Medication Information (Signed)
Summary: Ins verification for Prolia/ProliaPlus  Ins verification for Prolia/ProliaPlus   Imported By: Sherian Rein 12/19/2009 15:01:13  _____________________________________________________________________  External Attachment:    Type:   Image     Comment:   External Document

## 2010-06-24 NOTE — Miscellaneous (Signed)
Summary: BONE DENSITY  Clinical Lists Changes  Orders: Added new Test order of T-Lumbar Vertebral Assessment (77082) - Signed 

## 2010-06-26 NOTE — Medication Information (Signed)
Summary: Benefits/Prolia  Benefits/Prolia   Imported By: Lester [place] 06/17/2010 12:23:40  _____________________________________________________________________  External Attachment:    Type:   Image     Comment:   External Document

## 2010-06-26 NOTE — Assessment & Plan Note (Signed)
Summary: 6 MO ROV /NWS  #   Vital Signs:  Patient profile:   76 year old female Height:      62 inches Weight:      214 pounds BMI:     39.28 O2 Sat:      92 % on Room air Temp:     98.3 degrees F oral Pulse rate:   85 / minute BP sitting:   122 / 70  (left arm) Cuff size:   large  Vitals Entered By: Zella Ball Ewing CMA (AAMA) (June 04, 2010 11:07 AM)  O2 Flow:  Room air CC: 6 month ROV/RE   Primary Care Provider:  Oliver Barre, MD  CC:  6 month ROV/RE.  History of Present Illness: here to f/u; overall doing ok;  Pt denies CP, worsening sob, doe, wheezing, orthopnea, pnd, worsening LE edema, palps, dizziness or syncope  Pt denies new neuro symptoms such as headache, facial or extremity weakness  Pt denies polydipsia, polyuria, or low sugar symptoms such as shakiness improved with eating.  Overall good compliance with meds, trying to follow low chol, DM diet, wt stable, little excercise however  CBG's in  the lower 100's.  No fever, wt loss, night sweats, loss of appetite or other constitutional symptoms  Overall good compliance with meds, and good tolerability.  Denies worsening depressive symptoms, suicidal ideation, or panic.   Does also want to d/w pt most recent dxa as she was not sure about starting the prolia.  Problems Prior to Update: 1)  Osteoporosis  (ICD-733.00) 2)  Ecchymoses, Spontaneous  (ICD-782.7) 3)  Menopausal Disorder  (ICD-627.9) 4)  Anemia-iron Deficiency  (ICD-280.9) 5)  Acute Sinusitis, Unspecified  (ICD-461.9) 6)  Uri  (ICD-465.9) 7)  Shoulder Pain, Left  (ICD-719.41) 8)  Diabetes Mellitus, Type II  (ICD-250.00) 9)  Preventive Health Care  (ICD-V70.0) 10)  Anxiety  (ICD-300.00) 11)  Hyperlipidemia  (ICD-272.4) 12)  Cholelithiasis  (ICD-574.20) 13)  Low Back Pain  (ICD-724.2) 14)  Herniated Disc  (ICD-722.2) 15)  Gerd  (ICD-530.81) 16)  Diverticulosis, Colon  (ICD-562.10) 17)  Colonic Polyps, Hx of  (ICD-V12.72) 18)  Hypertension   (ICD-401.9)  Medications Prior to Update: 1)  Simvastatin 40 Mg Tabs (Simvastatin) .Marland Kitchen.. 1 By Mouth Once Daily 2)  Actoplus Met 15-500 Mg  Tabs (Pioglitazone Hcl-Metformin Hcl) .Marland Kitchen.. 1 By Mouth Two Times A Day 3)  Lisinopril-Hydrochlorothiazide 20-12.5 Mg  Tabs (Lisinopril-Hydrochlorothiazide) .... 2 By Mouth Once Daily 4)  Adult Aspirin Ec Low Strength 81 Mg  Tbec (Aspirin) .Marland Kitchen.. 1 By Mouth Qd 5)  Calcium 1200-1000 Mg-Unit Chew (Calcium Carbonate-Vit D-Min) .... One Tablet By Mouth Once Daily 6)  Temazepam 15 Mg Caps (Temazepam) .... One Tablet By Mouth Once Daily  Current Medications (verified): 1)  Lipitor 40 Mg Tabs (Atorvastatin Calcium) .Marland Kitchen.. 1 By Mouth Once Daily 2)  Actoplus Met 15-500 Mg  Tabs (Pioglitazone Hcl-Metformin Hcl) .Marland Kitchen.. 1 By Mouth Two Times A Day 3)  Lisinopril-Hydrochlorothiazide 20-12.5 Mg  Tabs (Lisinopril-Hydrochlorothiazide) .... 2 By Mouth Once Daily 4)  Adult Aspirin Ec Low Strength 81 Mg  Tbec (Aspirin) .Marland Kitchen.. 1 By Mouth Qd 5)  Calcium 1200-1000 Mg-Unit Chew (Calcium Carbonate-Vit D-Min) .... One Tablet By Mouth Once Daily 6)  Temazepam 15 Mg Caps (Temazepam) .... 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   Risk Factors: Exercise: no (11/29/2008)  Risk Factors: Smoking Status: current (05/28/2008) Packs/Day: 1/2 (05/28/2008) Cans of tobacco/wk: no (05/28/2008) Passive Smoke Exposure: yes (05/28/2008)  Past Medical History: Hypertension Colonic polyps, hx of Diverticulosis,  colon GERD Herniated Disc- C Spine and Lumbar Low back pain Cholelithiasis Hyperlipidemia c-spine disc disease lumbar disc disease Anxiety Diabetes mellitus, type II Anemia-iron deficiency Osteoporosis  Review of Systems       all otherwise negative per pt -    Physical Exam  General:  alert and overweight-appearing.   Head:  normocephalic and atraumatic.   Eyes:  vision grossly intact, pupils equal, and pupils round.   Ears:  R ear normal and L ear normal.   Nose:  no external deformity and no nasal discharge.   Mouth:  no gingival abnormalities and pharynx pink and moist.   Neck:  supple and no masses.   Lungs:  normal respiratory effort and normal breath sounds.   Heart:  normal rate and regular rhythm.   Msk:  no joint tenderness and no joint swelling.   Extremities:  no edema, no erythema  Neurologic:  cranial nerves II-XII intact and strength normal in all extremities.     Impression & Recommendations:  Problem # 1:  HYPERTENSION (ICD-401.9)  Her updated medication list for this problem includes:    Lisinopril-hydrochlorothiazide 20-12.5 Mg Tabs (Lisinopril-hydrochlorothiazide) .Marland Kitchen... 2 by mouth once daily  BP today: 122/70 Prior BP: 116/68 (11/29/2009)  Labs Reviewed: K+: 6.0 (06/02/2010) Creat: : 1.0 (06/02/2010)   Chol: 151 (06/02/2010)   HDL: 46.10 (06/02/2010)   LDL: 86 (06/02/2010)   TG: 96.0 (06/02/2010) stable overall by hx and exam, ok to continue meds/tx as is   Problem # 2:  DIABETES MELLITUS, TYPE II (ICD-250.00)  Her updated medication list for this problem includes:    Actoplus Met 15-500 Mg Tabs (Pioglitazone hcl-metformin hcl) .Marland Kitchen... 1 by mouth two times a day    Lisinopril-hydrochlorothiazide 20-12.5 Mg Tabs (Lisinopril-hydrochlorothiazide) .Marland Kitchen... 2 by mouth once daily    Adult Aspirin Ec Low Strength 81 Mg Tbec (Aspirin) .Marland Kitchen... 1 by mouth qd  Labs Reviewed: Creat: 1.0 (06/02/2010)    Reviewed HgBA1c results: 6.3 (06/02/2010)  6.2  (11/22/2009) stable overall by hx and exam, ok to continue meds/tx as is , Pt to cont DM diet, excercise, wt control efforts; to check labs again next visit  Problem # 3:  HYPERLIPIDEMIA (ICD-272.4)  Her updated medication list for this problem includes:    Lipitor 40 Mg Tabs (Atorvastatin calcium) .Marland Kitchen... 1 by mouth once daily  Labs Reviewed: SGOT: 16 (11/22/2009)   SGPT: 10 (11/22/2009)   HDL:46.10 (06/02/2010), 40.10 (11/22/2009)  LDL:86 (06/02/2010), 67 (11/22/2009)  Chol:151 (06/02/2010), 131 (11/22/2009)  Trig:96.0 (06/02/2010), 121.0 (11/22/2009) d/w pt ; goal ldl < 70 ; pt is close but ok to change to generic lipitor 40 once daily , Pt to continue diet efforts, f/u labs next visit  Problem # 4:  OSTEOPOROSIS (ICD-733.00) d/w pt and reivewed most recent dxa - will re-check on the prolia copay for 2012 as she is interested, o/w may need to consider fosamax, to also cont activity., calcium , and to start vit d 2000 units per day  Complete Medication List: 1)  Lipitor  40 Mg Tabs (Atorvastatin calcium) .Marland Kitchen.. 1 by mouth once daily 2)  Actoplus Met 15-500 Mg Tabs (Pioglitazone hcl-metformin hcl) .Marland Kitchen.. 1 by mouth two times a day 3)  Lisinopril-hydrochlorothiazide 20-12.5 Mg Tabs (Lisinopril-hydrochlorothiazide) .... 2 by mouth once daily 4)  Adult Aspirin Ec Low Strength 81 Mg Tbec (Aspirin) .Marland Kitchen.. 1 by mouth qd 5)  Calcium 1200-1000 Mg-unit Chew (Calcium carbonate-vit d-min) .... One tablet by mouth once daily 6)  Temazepam 15 Mg Caps (Temazepam) .... One tablet by mouth once daily  Patient Instructions: 1)  stop the simvastatin 2)  start the generic for lipitor 40 mg per day 3)  you should get a call in 1-2 wks about the copay fo the prolia for 2012 4)  Continue all previous medications as before this visit  5)  Please schedule a follow-up appointment in 6 months for CPX with labs and: 6)  HbgA1C prior to visit, ICD-9: 250.02 7)  Urine Microalbumin prior to visit,  ICD-9: Prescriptions: LIPITOR 40 MG TABS (ATORVASTATIN CALCIUM) 1 by mouth once daily  #90 x 3   Entered and Authorized by:   [name] MD   Signed by:   [name] MD on 06/04/2010   Method used:   Electronically to        [facility] [phone]* (retail)       [address] [place], [place]  [address]       Ph: [phone]       Fax: [phone]   RxID:   [id number]    Orders Added: 1)  Est. Patient Level IV [30160]

## 2010-06-27 NOTE — Medication Information (Signed)
Summary: ProliaPlus  ProliaPlus   Imported By: Lester Powell 12/18/2009 10:22:36  _____________________________________________________________________  External Attachment:    Type:   Image     Comment:   External Document

## 2010-07-03 ENCOUNTER — Encounter: Payer: Self-pay | Admitting: Internal Medicine

## 2010-07-10 NOTE — Letter (Signed)
Summary: Generic Letter  [facility]  [address] [place], [place] [address]   Phone: [phone]  Fax: [phone]    07/03/2010  Kathy Thomas [address] [place], [place]  [address]  Dear Kathy Thomas,  We have been trying to contact you regarding Prolia. Prolia is the medication Dr. Jonny Ruiz wanted [place] to verify whether it was covered by your insurance. Based on your benefit summary from Prolia Plus you will owe approximately 20% or $165.00 for the Prolia injection. Please contact our office when it is convenient for you to schedule the nurse visit.  We need to know a few days ahead of time so we can order the medication.  If you have questions, please call me.    Sincerely,   Lanier Prude, CMA(AAMA)for Dr. Jonny Ruiz

## 2010-07-10 NOTE — Progress Notes (Signed)
Summary: Prolia   Phone Note Outgoing Call   Summary of Call: I faxed ins verification request to Prolia today.............Marland Kitchenwill wait for benefit summary. Initial call taken by: Lanier Prude, [facility]),  June 05, 2010 8:16 AM  Follow-up for Phone Call        rec benefit summary for pt from Prolia. Pt will owe 20%co-ins= approx $165.00.  left mess for pt to call back  Follow-up by: Lanier Prude, [facility]),  June 12, 2010 4:29 PM  Additional Follow-up for Phone Call Additional follow up Details #1::        left mess for pt to call back  Additional Follow-up by: Lanier Prude, [facility]),  June 18, 2010 11:56 AM    Additional Follow-up for Phone Call Additional follow up Details #2::    left mess for pt to call back again Lanier Prude, [facility])  June 24, 2010 10:11 AM    called pt again to inform of above...no answer. Will mail letter to pt re: above. Closing phone note. Follow-up by: Lanier Prude, [facility]),  July 03, 2010 9:33 AM

## 2010-08-31 LAB — GLUCOSE, CAPILLARY: Glucose-Capillary: 103 mg/dL — ABNORMAL HIGH (ref 70–99)

## 2010-10-16 ENCOUNTER — Other Ambulatory Visit: Payer: Self-pay | Admitting: Internal Medicine

## 2010-10-23 ENCOUNTER — Other Ambulatory Visit: Payer: Self-pay | Admitting: Internal Medicine

## 2010-11-25 ENCOUNTER — Encounter: Payer: Self-pay | Admitting: Internal Medicine

## 2010-11-25 ENCOUNTER — Other Ambulatory Visit: Payer: Self-pay | Admitting: Internal Medicine

## 2010-11-25 ENCOUNTER — Other Ambulatory Visit: Payer: Self-pay

## 2010-11-25 DIAGNOSIS — I10 Essential (primary) hypertension: Secondary | ICD-10-CM

## 2010-11-25 DIAGNOSIS — Z0000 Encounter for general adult medical examination without abnormal findings: Secondary | ICD-10-CM

## 2010-11-25 DIAGNOSIS — IMO0001 Reserved for inherently not codable concepts without codable children: Secondary | ICD-10-CM

## 2010-12-01 ENCOUNTER — Ambulatory Visit: Payer: Self-pay | Admitting: Internal Medicine

## 2010-12-02 ENCOUNTER — Other Ambulatory Visit (INDEPENDENT_AMBULATORY_CARE_PROVIDER_SITE_OTHER): Payer: Self-pay | Admitting: Internal Medicine

## 2010-12-02 ENCOUNTER — Other Ambulatory Visit (INDEPENDENT_AMBULATORY_CARE_PROVIDER_SITE_OTHER): Payer: Self-pay

## 2010-12-02 DIAGNOSIS — IMO0001 Reserved for inherently not codable concepts without codable children: Secondary | ICD-10-CM

## 2010-12-02 DIAGNOSIS — Z0000 Encounter for general adult medical examination without abnormal findings: Secondary | ICD-10-CM

## 2010-12-02 LAB — LIPID PANEL
LDL Cholesterol: 58 mg/dL (ref 0–99)
Total CHOL/HDL Ratio: 3
Triglycerides: 84 mg/dL (ref 0.0–149.0)

## 2010-12-02 LAB — CBC WITH DIFFERENTIAL/PLATELET
Basophils Relative: 0.5 % (ref 0.0–3.0)
Eosinophils Absolute: 0.3 10*3/uL (ref 0.0–0.7)
HCT: 35.4 % — ABNORMAL LOW (ref 36.0–46.0)
Hemoglobin: 11.6 g/dL — ABNORMAL LOW (ref 12.0–15.0)
Lymphocytes Relative: 29.2 % (ref 12.0–46.0)
Lymphs Abs: 1.9 10*3/uL (ref 0.7–4.0)
MCHC: 32.8 g/dL (ref 30.0–36.0)
Monocytes Relative: 10.5 % (ref 3.0–12.0)
Neutro Abs: 3.7 10*3/uL (ref 1.4–7.7)
RBC: 3.96 Mil/uL (ref 3.87–5.11)

## 2010-12-02 LAB — BASIC METABOLIC PANEL
BUN: 25 mg/dL — ABNORMAL HIGH (ref 6–23)
CO2: 31 mEq/L (ref 19–32)
Calcium: 9.2 mg/dL (ref 8.4–10.5)
Chloride: 107 mEq/L (ref 96–112)
Creatinine, Ser: 1 mg/dL (ref 0.4–1.2)
Glucose, Bld: 103 mg/dL — ABNORMAL HIGH (ref 70–99)

## 2010-12-02 LAB — HEPATIC FUNCTION PANEL
AST: 17 U/L (ref 0–37)
Albumin: 3.8 g/dL (ref 3.5–5.2)
Alkaline Phosphatase: 70 U/L (ref 39–117)
Total Bilirubin: 0.4 mg/dL (ref 0.3–1.2)

## 2010-12-02 LAB — URINALYSIS, ROUTINE W REFLEX MICROSCOPIC
Ketones, ur: NEGATIVE
Specific Gravity, Urine: 1.015 (ref 1.000–1.030)
Total Protein, Urine: NEGATIVE
Urine Glucose: NEGATIVE

## 2010-12-06 ENCOUNTER — Encounter: Payer: Self-pay | Admitting: Internal Medicine

## 2010-12-06 DIAGNOSIS — Z0000 Encounter for general adult medical examination without abnormal findings: Secondary | ICD-10-CM | POA: Insufficient documentation

## 2010-12-08 ENCOUNTER — Encounter: Payer: Self-pay | Admitting: Internal Medicine

## 2010-12-08 ENCOUNTER — Ambulatory Visit (INDEPENDENT_AMBULATORY_CARE_PROVIDER_SITE_OTHER): Payer: Medicare PPO | Admitting: Internal Medicine

## 2010-12-08 VITALS — BP 120/72 | HR 63 | Temp 97.0°F | Ht 62.0 in | Wt 220.0 lb

## 2010-12-08 DIAGNOSIS — E119 Type 2 diabetes mellitus without complications: Secondary | ICD-10-CM

## 2010-12-08 DIAGNOSIS — M545 Low back pain, unspecified: Secondary | ICD-10-CM

## 2010-12-08 DIAGNOSIS — Z0000 Encounter for general adult medical examination without abnormal findings: Secondary | ICD-10-CM

## 2010-12-08 NOTE — Progress Notes (Signed)
Subjective:    Patient ID: Kathy Thomas, female    DOB: 12-19-1934, 76 y.o.   MRN: [id number]  HPI  Here for wellness and f/u;  Overall doing ok;  Pt denies CP, worsening SOB, DOE, wheezing, orthopnea, PND, worsening LE edema, palpitations, dizziness or syncope.  Pt denies neurological change such as new Headache, facial or extremity weakness.  Pt denies polydipsia, polyuria, or low sugar symptoms. Pt states overall good compliance with treatment and medications, good tolerability, and trying to follow lower cholesterol diet.  Pt denies worsening depressive symptoms, suicidal ideation or panic. No fever, wt loss, night sweats, loss of appetite, or other constitutional symptoms.  Pt states good ability with ADL's, low fall risk, home safety reviewed and adequate, no significant changes in hearing or vision, and occasionally active with exercise.  Pt continues to have recurring right LBP without change in severity, bowel or bladder change, fever, wt loss,  worsening LE pain/numbness/weakness, gait change or falls. Except for mild pain and numbness to right buttock.  Past Medical History  Diagnosis Date  . Acute sinusitis, unspecified 05/28/2008  . ANEMIA-IRON DEFICIENCY 10/26/2008  . ANXIETY 10/06/2007  . CHOLELITHIASIS 10/06/2007  . COLONIC POLYPS, HX OF 01/26/2007  . DIABETES MELLITUS, TYPE II 10/06/2007  . DIVERTICULOSIS, COLON 01/26/2007  . ECCHYMOSES, SPONTANEOUS 06/04/2010  . GERD 01/26/2007  . HERNIATED DISC 01/26/2007  . HYPERLIPIDEMIA 10/06/2007  . HYPERTENSION 01/26/2007  . LOW BACK PAIN 01/29/2007  . MENOPAUSAL DISORDER 10/26/2008  . OSTEOPOROSIS 06/04/2010  . SHOULDER PAIN, LEFT 10/06/2007   Past Surgical History  Procedure Date  . Appendectomy   . Abdominal hysterectomy   . Tonsillectomy   . Left elbow-nerve impingement 1985  . Left ankle-screw placement 1980  . Herniated disk  1995    C-spine  . Fracture left  wrist 2010    Dr. Amanda Pea    reports that she has quit smoking. She does not  have any smokeless tobacco history on file. She reports that she does not drink alcohol or use illicit drugs. family history includes Heart disease in her father; Hyperlipidemia in her other; Hypertension in her other; and Stroke in her other. Allergies  Allergen Reactions  . Morphine     REACTION: pt unsure of reaction  . Penicillins     REACTION: rash   Current Outpatient Prescriptions on File Prior to Visit  Medication Sig Dispense Refill  . ACTOPLUS MET 15-500 MG per tablet TAKE 1 TABLET BY MOUTH TWO TIMES A DAY  180 tablet  2  . atorvastatin (LIPITOR) 40 MG tablet Take 40 mg by mouth daily.        . Calcium 1200-1000 MG-UNIT CHEW Chew by mouth daily.        Marland Kitchen lisinopril-hydrochlorothiazide (PRINZIDE,ZESTORETIC) 20-12.5 MG per tablet TAKE 2 TABLET BY MOUTH ONCE DAILY  180 tablet  3   Review of Systems Review of Systems  Constitutional: Negative for diaphoresis, activity change, appetite change and unexpected weight change.  HENT: Negative for hearing loss, ear pain, facial swelling, mouth sores and neck stiffness.   Eyes: Negative for pain, redness and visual disturbance.  Respiratory: Negative for shortness of breath and wheezing.   Cardiovascular: Negative for chest pain and palpitations.  Gastrointestinal: Negative for diarrhea, blood in stool, abdominal distention and rectal pain.  Genitourinary: Negative for hematuria, flank pain and decreased urine volume.  Musculoskeletal: Negative for myalgias and joint swelling.  Skin: Negative for color change and wound.  Neurological: Negative for syncope and numbness.  Hematological: Negative for adenopathy.  Psychiatric/Behavioral: Negative for hallucinations, self-injury, decreased concentration and agitation.       Objective:   Physical Exam BP 120/72  Pulse 63  Temp(Src) 97 F (36.1 C) (Oral)  Ht 5\' 2"  (1.575 m)  Wt 220 lb (99.791 kg)  BMI 40.24 kg/m2  SpO2 97% Physical Exam  VS noted Constitutional: Pt is oriented to  person, place, and time. Appears well-developed and well-nourished.  HENT:  Head: Normocephalic and atraumatic.  Right Ear: External ear normal.  Left Ear: External ear normal.  Nose: Nose normal.  Mouth/Throat: Oropharynx is clear and moist.  Eyes: Conjunctivae and EOM are normal. Pupils are equal, round, and reactive to light.  Neck: Normal range of motion. Neck supple. No JVD present. No tracheal deviation present.  Cardiovascular: Normal rate, regular rhythm, normal heart sounds and intact distal pulses.   Pulmonary/Chest: Effort normal and breath sounds normal.  Abdominal: Soft. Bowel sounds are normal. There is no tenderness.  Musculoskeletal: Normal range of motion. Exhibits no edema.  Lymphadenopathy:  Has no cervical adenopathy.  Neurological: Pt is alert and oriented to person, place, and time. Pt has normal reflexes. No cranial nerve deficit. Motor/sens/dtr/gait intact;  Neg slr on the right Skin: Skin is warm and dry. No rash noted.  Psychiatric:  Has  normal mood and affect. Behavior is normal.         Assessment & Plan:

## 2010-12-08 NOTE — Assessment & Plan Note (Signed)
D/w pt - ok to start the prolia

## 2010-12-08 NOTE — Patient Instructions (Signed)
Continue all other medications as before Please see Stacy before leaving to mention you are ready to start the Prolia Please return in 6 mo with Lab testing done 3-5 days before

## 2010-12-08 NOTE — Assessment & Plan Note (Signed)
stable overall by hx and exam, most recent data reviewed with pt, and pt to continue medical treatment as before  Lab Results  Component Value Date   HGBA1C 6.5 12/02/2010

## 2010-12-08 NOTE — Assessment & Plan Note (Signed)
With mild right sciatica symptoms, pt wants to hold on further eval at this time;  Has Seen Dr Kritzer/NS in the past but he no longer takes her insuranace and symptoms are mild

## 2010-12-08 NOTE — Assessment & Plan Note (Signed)

## 2011-06-22 ENCOUNTER — Ambulatory Visit: Payer: Medicare PPO | Admitting: Internal Medicine

## 2011-07-17 ENCOUNTER — Other Ambulatory Visit: Payer: Self-pay | Admitting: Internal Medicine

## 2011-07-31 ENCOUNTER — Encounter: Payer: Self-pay | Admitting: Endocrinology

## 2011-07-31 ENCOUNTER — Ambulatory Visit (INDEPENDENT_AMBULATORY_CARE_PROVIDER_SITE_OTHER): Payer: Medicare Other | Admitting: Endocrinology

## 2011-07-31 DIAGNOSIS — M545 Low back pain, unspecified: Secondary | ICD-10-CM

## 2011-07-31 NOTE — Progress Notes (Signed)
  Subjective:    Patient ID: Kathy Thomas, female    DOB: 1935-03-21, 77 y.o.   MRN: [id number]  HPI Pt states 1 day of moderate pain rad from the lower back, to the lateral aspect of the left thigh.  No assoc numbness.  She has had disc prob at the c-spine many years ago.  Also, she had low-back pain approx 6 years ago.   Past Medical History  Diagnosis Date  . Acute sinusitis, unspecified 05/28/2008  . ANEMIA-IRON DEFICIENCY 10/26/2008  . ANXIETY 10/06/2007  . CHOLELITHIASIS 10/06/2007  . COLONIC POLYPS, HX OF 01/26/2007  . DIABETES MELLITUS, TYPE II 10/06/2007  . DIVERTICULOSIS, COLON 01/26/2007  . ECCHYMOSES, SPONTANEOUS 06/04/2010  . GERD 01/26/2007  . HERNIATED DISC 01/26/2007  . HYPERLIPIDEMIA 10/06/2007  . HYPERTENSION 01/26/2007  . LOW BACK PAIN 01/29/2007  . MENOPAUSAL DISORDER 10/26/2008  . OSTEOPOROSIS 06/04/2010  . SHOULDER PAIN, LEFT 10/06/2007    Past Surgical History  Procedure Date  . Appendectomy   . Abdominal hysterectomy   . Tonsillectomy   . Left elbow-nerve impingement 1985  . Left ankle-screw placement 1980  . Herniated disk  1995    C-spine  . Fracture left  wrist 2010    Dr. Amanda Pea    History   Social History  . Marital Status: Widowed    Spouse Name: N/A    Number of Children: N/A  . Years of Education: N/A   Occupational History  . Not on file.   Social History Main Topics  . Smoking status: Current Everyday Smoker  . Smokeless tobacco: Not on file  . Alcohol Use: No  . Drug Use: No  . Sexually Active: Not on file   Other Topics Concern  . Not on file   Social History Narrative  . No narrative on file    Current Outpatient Prescriptions on File Prior to Visit  Medication Sig Dispense Refill  . ACTOPLUS MET 15-500 MG per tablet TAKE 1 TABLET BY MOUTH TWO TIMES A DAY  180 tablet  2  . atorvastatin (LIPITOR) 40 MG tablet TAKE 1 TABLET BY MOUTH ONCE A DAY  90 tablet  1  . Calcium 1200-1000 MG-UNIT CHEW Chew by mouth daily.        Marland Kitchen  lisinopril-hydrochlorothiazide (PRINZIDE,ZESTORETIC) 20-12.5 MG per tablet TAKE 2 TABLET BY MOUTH ONCE DAILY  180 tablet  3    Allergies  Allergen Reactions  . Codeine   . Morphine     REACTION: pt unsure of reaction  . Penicillins     REACTION: rash    Family History  Problem Relation Age of Onset  . Heart disease Father   . Hyperlipidemia Other   . Stroke Other   . Hypertension Other     BP 134/82  Pulse 68  Temp(Src) 97.3 F (36.3 C) (Oral)  SpO2 97%    Review of Systems Denies bowel or bladder retention.  Denies leg weakness.    Objective:   Physical Exam VITAL SIGNS:  See vs page GENERAL: no distress Spine: nontender Motor: normal throughout LE's Neuro: sensation is intact to touch on the LE's Gait: slightly favors left LE.         Assessment & Plan:  Left L-5 radiculopathy, new

## 2011-07-31 NOTE — Patient Instructions (Addendum)
Please schedule your physical with dr Jonny Ruiz, and do blood tests today. I hope you feel better soon.  If you don't feel better by next week, please call dr Jonny Ruiz Take advil according to the label.  While you are on this, take prilosec 2x20 mg daily (to protect your stomach).  Here are some samples.You don't need full bedrest, but limit activity according to your symptoms.    Lumbosacral Radiculopathy Lumbosacral radiculopathy is a pinched nerve or nerves in the low back (lumbosacral area). When this happens you may have weakness in your legs and may not be able to stand on your toes. You may have pain going down into your legs. There may be difficulties with walking normally. There are many causes of this problem. Sometimes this may happen from an injury, or simply from arthritis or boney problems. It may also be caused by other illnesses such as diabetes. If there is no improvement after treatment, further studies may be done to find the exact cause. DIAGNOSIS   X-rays may be needed if the problems become long standing. Electromyograms may be done. This study is one in which the working of nerves and muscles is studied. HOME CARE INSTRUCTIONS    Applications of ice packs may be helpful. Ice can be used in a plastic bag with a towel around it to prevent frostbite to skin. This may be used every 2 hours for 20 to 30 minutes, or as needed, while awake, or as directed by your caregiver.   Only take over-the-counter or prescription medicines for pain, discomfort, or fever as directed by your caregiver.   If physical therapy was prescribed, follow your caregiver's directions.  SEEK IMMEDIATE MEDICAL CARE IF:    You have pain not controlled with medications.   You seem to be getting worse rather than better.   You develop increasing weakness in your legs.   You develop loss of bowel or bladder control.   You have difficulty with walking or balance, or develop clumsiness in the use of your legs.    You have a fever.  MAKE SURE YOU:    Understand these instructions.   Will watch your condition.   Will get help right away if you are not doing well or get worse.  Document Released: 05/11/2005 Document Revised: 04/30/2011 Document Reviewed: 12/30/2007 [facility] Patient Information 2012 [place], [place].

## 2011-08-01 ENCOUNTER — Other Ambulatory Visit: Payer: Self-pay | Admitting: Family Medicine

## 2011-08-01 MED ORDER — TRAMADOL HCL 50 MG PO TABS: 50.0000 mg | ORAL_TABLET | Freq: Three times a day (TID) | ORAL | Status: AC | PRN

## 2011-08-01 NOTE — Telephone Encounter (Signed)
Received call from pt's daughter requesting pain medication. Per daughter, she was told to call today to get pain medication if she needs it (was seen yesterday) Has codeine and morphine listed as drug allergy. Advised pt's daughter that we cannot call in narcotic,especially given her drug allergies. Offered tramadol but explained with codeine allergy she may have reaction as well to this. Daughter would like to try the tramadol since codeine only upsets stomach, not true allergy.

## 2011-09-10 ENCOUNTER — Other Ambulatory Visit: Payer: Self-pay | Admitting: Internal Medicine

## 2011-10-09 ENCOUNTER — Other Ambulatory Visit: Payer: Self-pay | Admitting: Internal Medicine

## 2011-10-23 ENCOUNTER — Telehealth: Payer: Self-pay

## 2011-10-23 DIAGNOSIS — IMO0001 Reserved for inherently not codable concepts without codable children: Secondary | ICD-10-CM

## 2011-10-23 DIAGNOSIS — Z0000 Encounter for general adult medical examination without abnormal findings: Secondary | ICD-10-CM

## 2011-10-23 NOTE — Telephone Encounter (Signed)
Called the patient left detailed message labs have been ordered per patient request

## 2011-10-23 NOTE — Telephone Encounter (Signed)
Message copied by Pincus Sanes on Fri Oct 23, 2011  9:54 AM ------      Message from: Newell Coral      Created: Fri Oct 23, 2011  9:48 AM       The pt has scheduled a 6 month follow up and is hoping to get labs done with this apt

## 2011-10-23 NOTE — Telephone Encounter (Signed)
Labs ordered.

## 2011-10-27 ENCOUNTER — Other Ambulatory Visit (INDEPENDENT_AMBULATORY_CARE_PROVIDER_SITE_OTHER): Payer: Medicare Other

## 2011-10-27 DIAGNOSIS — Z0000 Encounter for general adult medical examination without abnormal findings: Secondary | ICD-10-CM

## 2011-10-27 DIAGNOSIS — IMO0001 Reserved for inherently not codable concepts without codable children: Secondary | ICD-10-CM

## 2011-10-27 LAB — HEPATIC FUNCTION PANEL
ALT: 12 U/L (ref 0–35)
AST: 14 U/L (ref 0–37)
Albumin: 3.3 g/dL — ABNORMAL LOW (ref 3.5–5.2)
Alkaline Phosphatase: 77 U/L (ref 39–117)
Total Protein: 7 g/dL (ref 6.0–8.3)

## 2011-10-27 LAB — BASIC METABOLIC PANEL
CO2: 29 mEq/L (ref 19–32)
Chloride: 108 mEq/L (ref 96–112)
GFR: 56.22 mL/min — ABNORMAL LOW (ref >60.00)
Glucose, Bld: 95 mg/dL (ref 70–99)
Potassium: 5.7 mEq/L — ABNORMAL HIGH (ref 3.5–5.1)
Sodium: 144 mEq/L (ref 135–145)

## 2011-10-27 LAB — TSH: TSH: 0.87 u[IU]/mL (ref 0.35–5.50)

## 2011-10-27 LAB — URINALYSIS, ROUTINE W REFLEX MICROSCOPIC
Bilirubin Urine: NEGATIVE
Ketones, ur: NEGATIVE
Specific Gravity, Urine: 1.025 (ref 1.000–1.030)
Urobilinogen, UA: 0.2 (ref 0.0–1.0)

## 2011-10-27 LAB — CBC WITH DIFFERENTIAL/PLATELET
Basophils Absolute: 0 10*3/uL (ref 0.0–0.1)
HCT: 34.8 % — ABNORMAL LOW (ref 36.0–46.0)
Lymphs Abs: 1.7 10*3/uL (ref 0.7–4.0)
Monocytes Relative: 10.1 % (ref 3.0–12.0)
Platelets: 204 10*3/uL (ref 150.0–400.0)
RDW: 14.6 % (ref 11.5–14.6)

## 2011-10-27 LAB — MICROALBUMIN / CREATININE URINE RATIO
Creatinine,U: 110.3 mg/dL
Microalb Creat Ratio: 0.6 mg/g (ref 0.0–30.0)

## 2011-10-28 ENCOUNTER — Encounter: Payer: Self-pay | Admitting: Internal Medicine

## 2011-10-28 ENCOUNTER — Other Ambulatory Visit: Payer: Self-pay | Admitting: Internal Medicine

## 2011-10-28 ENCOUNTER — Ambulatory Visit (INDEPENDENT_AMBULATORY_CARE_PROVIDER_SITE_OTHER): Payer: Medicare Other | Admitting: Internal Medicine

## 2011-10-28 VITALS — BP 110/72 | HR 69 | Temp 97.4°F | Ht 61.0 in | Wt 215.4 lb

## 2011-10-28 DIAGNOSIS — I10 Essential (primary) hypertension: Secondary | ICD-10-CM

## 2011-10-28 DIAGNOSIS — E119 Type 2 diabetes mellitus without complications: Secondary | ICD-10-CM

## 2011-10-28 DIAGNOSIS — T148XXA Other injury of unspecified body region, initial encounter: Secondary | ICD-10-CM

## 2011-10-28 DIAGNOSIS — F4320 Adjustment disorder, unspecified: Secondary | ICD-10-CM | POA: Insufficient documentation

## 2011-10-28 DIAGNOSIS — IMO0001 Reserved for inherently not codable concepts without codable children: Secondary | ICD-10-CM

## 2011-10-28 DIAGNOSIS — M5416 Radiculopathy, lumbar region: Secondary | ICD-10-CM | POA: Insufficient documentation

## 2011-10-28 DIAGNOSIS — W57XXXA Bitten or stung by nonvenomous insect and other nonvenomous arthropods, initial encounter: Secondary | ICD-10-CM | POA: Insufficient documentation

## 2011-10-28 DIAGNOSIS — Z0000 Encounter for general adult medical examination without abnormal findings: Secondary | ICD-10-CM

## 2011-10-28 DIAGNOSIS — IMO0002 Reserved for concepts with insufficient information to code with codable children: Secondary | ICD-10-CM

## 2011-10-28 DIAGNOSIS — F4321 Adjustment disorder with depressed mood: Secondary | ICD-10-CM | POA: Insufficient documentation

## 2011-10-28 MED ORDER — DOXYCYCLINE HYCLATE 100 MG PO TABS: 100.0000 mg | ORAL_TABLET | Freq: Two times a day (BID) | ORAL | Status: AC

## 2011-10-28 MED ORDER — LOSARTAN POTASSIUM-HCTZ 100-12.5 MG PO TABS: 1.0000 | ORAL_TABLET | Freq: Every day | ORAL | Status: DC

## 2011-10-28 MED ORDER — ASPIRIN 81 MG PO TBEC: 81.0000 mg | DELAYED_RELEASE_TABLET | Freq: Every day | ORAL | Status: AC

## 2011-10-28 MED ORDER — ATORVASTATIN CALCIUM 40 MG PO TABS: 40.0000 mg | ORAL_TABLET | Freq: Every day | ORAL | Status: DC

## 2011-10-28 MED ORDER — PIOGLITAZONE HCL-METFORMIN HCL 15-500 MG PO TABS: 1.0000 | ORAL_TABLET | Freq: Two times a day (BID) | ORAL | Status: DC

## 2011-10-28 NOTE — Assessment & Plan Note (Signed)

## 2011-10-28 NOTE — Assessment & Plan Note (Signed)
With persistent elev K on labs - to d/c the lisinopril hct; change to losartanHCT 100/12.5 qd, f/u labs next visit BP Readings from Last 3 Encounters:  10/28/11 110/72  07/31/11 134/82  12/08/10 120/72

## 2011-10-28 NOTE — Assessment & Plan Note (Signed)
With borderline depression, d/w pt - declines ssri or counseling at this time, to call if changes her mind

## 2011-10-28 NOTE — Progress Notes (Signed)
Subjective:    Patient ID: Kathy Thomas, female    DOB: December 08, 1934, 77 y.o.   MRN: [id number]  HPI  Here for wellness and f/u;  Overall doing ok;  Pt denies CP, worsening SOB, DOE, wheezing, orthopnea, PND, worsening LE edema, palpitations, dizziness or syncope.  Pt denies neurological change such as new Headache, facial or extremity weakness.  Pt denies polydipsia, polyuria, or low sugar symptoms. Pt states overall good compliance with treatment and medications, good tolerability, and trying to follow lower cholesterol diet.  Pt denies suicidal ideation or panic. No fever, wt loss, night sweats, loss of appetite, or other constitutional symptoms.  Pt states good ability with ADL's, low fall risk, home safety reviewed and adequate, no significant changes in hearing or vision, and occasionally active with exercise.  Unfortunately had 3 deaths in the family recent, with significant grief and ? Mild depression but not interested in SSRI or counseling at this time.  Does have an insect bite of some kind to the left lat calf area last wk, now with 2 days marked tender/red/swelling at the site without drainage.  Also unfortunately still has distall LLE weakness and what sounds like left foot drop after an initial severe Left lower back pain and seen per Dr Everardo All approx 2 mo ago.   Past Medical History  Diagnosis Date  . Acute sinusitis, unspecified 05/28/2008  . ANEMIA-IRON DEFICIENCY 10/26/2008  . ANXIETY 10/06/2007  . CHOLELITHIASIS 10/06/2007  . COLONIC POLYPS, HX OF 01/26/2007  . DIABETES MELLITUS, TYPE II 10/06/2007  . DIVERTICULOSIS, COLON 01/26/2007  . ECCHYMOSES, SPONTANEOUS 06/04/2010  . GERD 01/26/2007  . HERNIATED DISC 01/26/2007  . HYPERLIPIDEMIA 10/06/2007  . HYPERTENSION 01/26/2007  . LOW BACK PAIN 01/29/2007  . MENOPAUSAL DISORDER 10/26/2008  . OSTEOPOROSIS 06/04/2010  . SHOULDER PAIN, LEFT 10/06/2007   Past Surgical History  Procedure Date  . Appendectomy   . Abdominal hysterectomy   .  Tonsillectomy   . Left elbow-nerve impingement 1985  . Left ankle-screw placement 1980  . Herniated disk  1995    C-spine  . Fracture left  wrist 2010    Dr. Amanda Pea    reports that she has been smoking.  She does not have any smokeless tobacco history on file. She reports that she does not drink alcohol or use illicit drugs. family history includes Heart disease in her father; Hyperlipidemia in her other; Hypertension in her other; and Stroke in her other. Allergies  Allergen Reactions  . Codeine   . Morphine     REACTION: pt unsure of reaction  . Penicillins     REACTION: rash   Current Outpatient Prescriptions on File Prior to Visit  Medication Sig Dispense Refill  . Calcium 1200-1000 MG-UNIT CHEW Chew by mouth daily.        Marland Kitchen DISCONTD: atorvastatin (LIPITOR) 40 MG tablet TAKE 1 TABLET BY MOUTH ONCE A DAY  90 tablet  1  . DISCONTD: pioglitazone-metformin (ACTOPLUS MET) 15-500 MG per tablet TAKE 1 TABLET BY MOUTH TWICE A DAY  180 tablet  3  . losartan-hydrochlorothiazide (HYZAAR) 100-12.5 MG per tablet Take 1 tablet by mouth daily.  90 tablet  3   Review of Systems Review of Systems  Constitutional: Negative for diaphoresis, activity change, appetite change and unexpected weight change.  HENT: Negative for hearing loss, ear pain, facial swelling, mouth sores and neck stiffness.   Eyes: Negative for pain, redness and visual disturbance.  Respiratory: Negative for shortness of breath and wheezing.  Cardiovascular: Negative for chest pain and palpitations.  Gastrointestinal: Negative for diarrhea, blood in stool, abdominal distention and rectal pain.  Genitourinary: Negative for hematuria, flank pain and decreased urine volume.  Musculoskeletal: Negative for myalgias and joint swelling.  Skin: Negative for color change and wound. except for the above Neurological: Negative for syncope and numbness.  Hematological: Negative for adenopathy.  Psychiatric/Behavioral: Negative for  hallucinations, self-injury, decreased concentration and agitation.     Objective:   Physical Exam BP 110/72  Pulse 69  Temp(Src) 97.4 F (36.3 C) (Oral)  Ht 5\' 1"  (1.549 m)  Wt 215 lb 6 oz (97.693 kg)  BMI 40.69 kg/m2  SpO2 97% Physical Exam  VS noted Constitutional: Pt is oriented to person, place, and time. Appears well-developed and well-nourished.  HENT:  Head: Normocephalic and atraumatic.  Right Ear: External ear normal.  Left Ear: External ear normal.  Nose: Nose normal.  Mouth/Throat: Oropharynx is clear and moist.  Eyes: Conjunctivae and EOM are normal. Pupils are equal, round, and reactive to light.  Neck: Normal range of motion. Neck supple. No JVD present. No tracheal deviation present.  Cardiovascular: Normal rate, regular rhythm, normal heart sounds and intact distal pulses.   Pulmonary/Chest: Effort normal and breath sounds normal.  Abdominal: Soft. Bowel sounds are normal. There is no tenderness.  Musculoskeletal: Normal range of motion. Exhibits no edema.  Lymphadenopathy:  Has no cervical adenopathy.  Spine: nontender throughout, no paravertebral tender Neurological: Pt is alert and oriented to person, place, and time. Pt has normal reflexes. No cranial nerve deficit. Motor/sens/dtr/gait intact except for 4+/5 distal LLE weakness/foot drop   Skin: Skin is warm and dry. No rash noted. except for 2 cm area left lateral calf bite area with red/tender/swelling but no red streaks or drainage Psychiatric:  Has  normal mood and affect. Behavior is normal. 1+ nervous, mild dysphoric, mild tearful    Assessment & Plan:

## 2011-10-28 NOTE — Assessment & Plan Note (Signed)
Mild to mod, for antibx course,  to f/u any worsening symptoms or concerns 

## 2011-10-28 NOTE — Assessment & Plan Note (Signed)
stable overall by hx and exam, most recent data reviewed with pt, and pt to continue medical treatment as before Lab Results  Component Value Date   HGBA1C 6.3 10/27/2011

## 2011-10-28 NOTE — Assessment & Plan Note (Signed)
Pain improved, but with definite left foot weakness/drop foot - for MRI, and refer ortho/murphy-wainer

## 2011-10-28 NOTE — Patient Instructions (Addendum)
Take all new medications as prescribed - the antibiotic - doxycylcline Ok to stop the lisinopril HCT as the elevated potassium is likely related to this Please start the Losartan HCT instead Continue all other medications as before Please have the pharmacy call with any refills you may need. You will be contacted regarding the referral for: MRI and orthopedic (murphy-wainer) Please also try OTC allegra for the head congestion which can help with allergies Please call if you feel you need Lexapro 10 mg for depression, or counseling referral Please return in 6 mo with Lab testing done 3-5 days before, or sooner if needed

## 2011-12-10 ENCOUNTER — Other Ambulatory Visit: Payer: Self-pay | Admitting: Internal Medicine

## 2012-01-04 ENCOUNTER — Telehealth: Payer: Self-pay | Admitting: Internal Medicine

## 2012-01-04 NOTE — Telephone Encounter (Signed)
noted 

## 2012-01-04 NOTE — Telephone Encounter (Signed)
Dr Jonny Ruiz, [facility] imaging contacted pt about MRI pt told them she was feeling better and would call back if needed.  Thanks

## 2012-01-09 ENCOUNTER — Ambulatory Visit (INDEPENDENT_AMBULATORY_CARE_PROVIDER_SITE_OTHER): Payer: Medicare Other | Admitting: Emergency Medicine

## 2012-01-09 VITALS — BP 154/73 | HR 71 | Temp 98.1°F | Resp 16 | Ht 62.5 in | Wt 216.0 lb

## 2012-01-09 DIAGNOSIS — W57XXXA Bitten or stung by nonvenomous insect and other nonvenomous arthropods, initial encounter: Secondary | ICD-10-CM

## 2012-01-09 DIAGNOSIS — T148 Other injury of unspecified body region: Secondary | ICD-10-CM

## 2012-01-09 DIAGNOSIS — L089 Local infection of the skin and subcutaneous tissue, unspecified: Secondary | ICD-10-CM

## 2012-01-09 MED ORDER — DOXYCYCLINE HYCLATE 100 MG PO TABS: 100.0000 mg | ORAL_TABLET | Freq: Two times a day (BID) | ORAL | Status: DC

## 2012-01-09 MED ORDER — DESONIDE 0.05 % EX CREA: TOPICAL_CREAM | Freq: Two times a day (BID) | CUTANEOUS | Status: DC

## 2012-01-09 MED ORDER — METHYLPREDNISOLONE ACETATE 80 MG/ML IJ SUSP
120.0000 mg | Freq: Once | INTRAMUSCULAR | Status: AC
  Administered 2012-01-09: 120 mg via INTRAMUSCULAR

## 2012-01-09 MED ORDER — DOXYCYCLINE HYCLATE 100 MG PO TABS: 100.0000 mg | ORAL_TABLET | Freq: Two times a day (BID) | ORAL | Status: AC

## 2012-01-09 NOTE — Progress Notes (Signed)
Date:  01/09/2012   Name:  Kathy Thomas   DOB:  [birth year]   MRN:  [id number] Gender: female  Age: 77 y.o.  PCP:  Kathy Barre, MD    Chief Complaint: Spider bite? Rt hand swollen   History of Present Illness:  Kathy Thomas is a 77 y.o. pleasant patient who presents with the following:  Has swollen red and pruritic area on right hand and wrist.  Says she thinks she was bitten by a spider in her sleep. Has no recollection of a bite or sting out in the yard but spends a lot of time in the yard.  Now has large swelling and red area on dorsal hand and wrist with some lymphangitis.  No fever or chills.  Denies any systemic allergic effects  Patient Active Problem List  Diagnosis  . DIABETES MELLITUS, TYPE II  . HYPERLIPIDEMIA  . ANEMIA-IRON DEFICIENCY  . ANXIETY  . HYPERTENSION  . GERD  . DIVERTICULOSIS, COLON  . CHOLELITHIASIS  . MENOPAUSAL DISORDER  . SHOULDER PAIN, LEFT  . HERNIATED DISC  . LOW BACK PAIN  . COLONIC POLYPS, HX OF  . OSTEOPOROSIS  . ECCHYMOSES, SPONTANEOUS  . Preventative health care  . Left lumbar radiculopathy  . Grief reaction  . Insect bite, infected    Past Medical History  Diagnosis Date  . Acute sinusitis, unspecified 05/28/2008  . ANEMIA-IRON DEFICIENCY 10/26/2008  . ANXIETY 10/06/2007  . CHOLELITHIASIS 10/06/2007  . COLONIC POLYPS, HX OF 01/26/2007  . DIABETES MELLITUS, TYPE II 10/06/2007  . DIVERTICULOSIS, COLON 01/26/2007  . ECCHYMOSES, SPONTANEOUS 06/04/2010  . GERD 01/26/2007  . HERNIATED DISC 01/26/2007  . HYPERLIPIDEMIA 10/06/2007  . HYPERTENSION 01/26/2007  . LOW BACK PAIN 01/29/2007  . MENOPAUSAL DISORDER 10/26/2008  . OSTEOPOROSIS 06/04/2010  . SHOULDER PAIN, LEFT 10/06/2007    Past Surgical History  Procedure Date  . Appendectomy   . Abdominal hysterectomy   . Tonsillectomy   . Left elbow-nerve impingement 1985  . Left ankle-screw placement 1980  . Herniated disk  1995    C-spine  . Fracture left  wrist 2010    Dr. Amanda Thomas     History  Substance Use Topics  . Smoking status: Current Everyday Smoker  . Smokeless tobacco: Not on file  . Alcohol Use: No    Family History  Problem Relation Age of Onset  . Heart disease Father   . Hyperlipidemia Other   . Stroke Other   . Hypertension Other     Allergies  Allergen Reactions  . Codeine   . Morphine     REACTION: pt unsure of reaction  . Penicillins     REACTION: rash    Medication list has been reviewed and updated.  Current Outpatient Prescriptions on File Prior to Visit  Medication Sig Dispense Refill  . aspirin 81 MG EC tablet Take 1 tablet (81 mg total) by mouth daily. Swallow whole.  30 tablet  12  . atorvastatin (LIPITOR) 40 MG tablet Take 1 tablet (40 mg total) by mouth daily.  90 tablet  3  . Calcium 1200-1000 MG-UNIT CHEW Chew by mouth daily.        Marland Kitchen losartan-hydrochlorothiazide (HYZAAR) 100-12.5 MG per tablet Take 1 tablet by mouth daily.  90 tablet  3  . pioglitazone-metformin (ACTOPLUS MET) 15-500 MG per tablet TAKE 1 TABLET BY MOUTH TWICE A DAY  180 tablet  2   No current facility-administered medications on file prior to visit.  Review of Systems:  As per HPI, otherwise negative.    Physical Examination: Filed Vitals:   01/09/12 1507  BP: 154/73  Pulse: 71  Temp: 98.1 F (36.7 C)  Resp: 16   Filed Vitals:   01/09/12 1507  Height: 5' 2.5" (1.588 m)  Weight: 216 lb (97.977 kg)   Body mass index is 38.88 kg/(m^2). Ideal Body Weight: Weight in (lb) to have BMI = 25: 138.6    GEN: WDWN, NAD, Non-toxic, Alert & Oriented x 3 HEENT: Atraumatic, Normocephalic.  Ears and Nose: No external deformity. EXTR: No clubbing/cyanosis/edema NEURO: Normal gait.  PSYCH: Normally interactive. Conversant. Not depressed or anxious appearing.  Calm demeanor.  Hand:  Right hand and wrist red and swollen and warm.  Two indurated areas approx 1 cm diameter that are tender  Assessment and Plan: Cellulitis vs local allergic  reaction Depo medrol Doxycycline Follow up as needed   Carmelina Dane, MD

## 2012-01-15 ENCOUNTER — Telehealth: Payer: Self-pay

## 2012-01-15 ENCOUNTER — Other Ambulatory Visit: Payer: Self-pay | Admitting: Physician Assistant

## 2012-01-15 DIAGNOSIS — L03119 Cellulitis of unspecified part of limb: Secondary | ICD-10-CM

## 2012-01-15 NOTE — Telephone Encounter (Signed)
Pt's granddaugher called requesting a referral to wound care in [place], patient has an appt with dr Wardell Heath at [phone] and they need it faxed to [phone]  Please call [phone] once it has been faxed

## 2012-01-15 NOTE — Telephone Encounter (Signed)
I have spoken to wound center, patient there for her hand wound please advise if okay to send referral

## 2012-01-15 NOTE — Telephone Encounter (Signed)
I have had Chelle put in referral, since patient was there at wound center and nurse was concerned about how the hand appeared.

## 2012-01-15 NOTE — Telephone Encounter (Signed)
Pt is currently at [facility] and they are stating that they never received a referral for this pt. They would like for someone to send an order over to them asap. [phone] Fax: [phone]

## 2012-01-15 NOTE — Telephone Encounter (Signed)
I have called, to see what referral is for. Left message for call me back. Was seen recently for hand.

## 2012-01-23 ENCOUNTER — Other Ambulatory Visit: Payer: Self-pay | Admitting: Internal Medicine

## 2012-04-27 ENCOUNTER — Ambulatory Visit: Payer: Medicare Other | Admitting: Internal Medicine

## 2012-05-06 ENCOUNTER — Encounter: Payer: Self-pay | Admitting: Internal Medicine

## 2012-05-06 ENCOUNTER — Ambulatory Visit (INDEPENDENT_AMBULATORY_CARE_PROVIDER_SITE_OTHER): Payer: Medicare Other | Admitting: Internal Medicine

## 2012-05-06 VITALS — BP 110/78 | HR 68 | Temp 97.6°F | Ht 62.0 in | Wt 224.0 lb

## 2012-05-06 DIAGNOSIS — M545 Low back pain, unspecified: Secondary | ICD-10-CM

## 2012-05-06 DIAGNOSIS — Z0000 Encounter for general adult medical examination without abnormal findings: Secondary | ICD-10-CM

## 2012-05-06 DIAGNOSIS — I10 Essential (primary) hypertension: Secondary | ICD-10-CM

## 2012-05-06 DIAGNOSIS — E119 Type 2 diabetes mellitus without complications: Secondary | ICD-10-CM

## 2012-05-06 DIAGNOSIS — E785 Hyperlipidemia, unspecified: Secondary | ICD-10-CM

## 2012-05-06 MED ORDER — METFORMIN HCL ER 500 MG PO TB24: 500.0000 mg | ORAL_TABLET | Freq: Every day | ORAL | Status: DC

## 2012-05-06 MED ORDER — METFORMIN HCL ER 500 MG PO TB24: 1000.0000 mg | ORAL_TABLET | Freq: Every day | ORAL | Status: DC

## 2012-05-06 NOTE — Patient Instructions (Addendum)
OK to stop the actos plus met Ok to start the metformin ER 500 mg - 2 pills per day Continue all other medications as before Please have the pharmacy call with any other refills you may need. Please continue your efforts at being more active, low cholesterol diabetic diet, and weight control. Thank you for enrolling in MyChart. Please follow the instructions below to securely access your online medical record. MyChart allows you to send messages to your doctor, view your test results, renew your prescriptions, schedule appointments, and more. To Log into MyChart, please go to [email or web address], and your Username is: [email or web address] You are given the form for the handicap parking permit due to your left leg and back pain Please return in 6 mo with Lab testing done 3-5 days before

## 2012-05-07 ENCOUNTER — Encounter: Payer: Self-pay | Admitting: Internal Medicine

## 2012-05-07 NOTE — Assessment & Plan Note (Signed)
stable overall by hx and exam, most recent data reviewed with pt, and pt to continue medical treatment as before Lab Results  Component Value Date   LDLCALC 50 10/27/2011

## 2012-05-07 NOTE — Assessment & Plan Note (Signed)
stable overall by hx and exam, most recent data reviewed with pt, and pt to continue medical treatment as before BP Readings from Last 3 Encounters:  05/06/12 110/78  01/09/12 154/73  10/28/11 110/72

## 2012-05-07 NOTE — Progress Notes (Signed)
Subjective:    Patient ID: Kathy Thomas, female    DOB: [birth year], 77 y.o.   MRN: [id number]  HPI  Here to f/u; overall doing ok,  Pt denies chest pain, increased sob or doe, wheezing, orthopnea, PND, increased LE swelling, palpitations, dizziness or syncope.  Pt denies new neurological symptoms such as new headache, or facial or extremity weakness or numbness   Pt denies polydipsia, polyuria, or low sugar symptoms such as weakness or confusion improved with po intake.  Pt states overall good compliance with meds, trying to follow lower cholesterol, diabetic diet, wt overall stable but little exercise however.  Finding it very difficult to lose wt, and since doing so well with diet and meds and a1c recently, she asks to stop the actos part of her regimen to help with effecting wt loss.  Pt continues to have recurring left LBP without change in severity, bowel or bladder change, fever, wt loss,  worsening LE pain/numbness/weakness, gait change or falls, but hard to walk too far across parking lots, needs handicap parking permit form signed Past Medical History  Diagnosis Date  . Acute sinusitis, unspecified 05/28/2008  . ANEMIA-IRON DEFICIENCY 10/26/2008  . ANXIETY 10/06/2007  . CHOLELITHIASIS 10/06/2007  . COLONIC POLYPS, HX OF 01/26/2007  . DIABETES MELLITUS, TYPE II 10/06/2007  . DIVERTICULOSIS, COLON 01/26/2007  . ECCHYMOSES, SPONTANEOUS 06/04/2010  . GERD 01/26/2007  . HERNIATED DISC 01/26/2007  . HYPERLIPIDEMIA 10/06/2007  . HYPERTENSION 01/26/2007  . LOW BACK PAIN 01/29/2007  . MENOPAUSAL DISORDER 10/26/2008  . OSTEOPOROSIS 06/04/2010  . SHOULDER PAIN, LEFT 10/06/2007   Past Surgical History  Procedure Date  . Appendectomy   . Abdominal hysterectomy   . Tonsillectomy   . Left elbow-nerve impingement 1985  . Left ankle-screw placement 1980  . Herniated disk  1995    C-spine  . Fracture left  wrist 2010    Dr. Amanda Pea    reports that she has been smoking.  She does not have any smokeless tobacco  history on file. She reports that she does not drink alcohol or use illicit drugs. family history includes Heart disease in her father; Hyperlipidemia in her other; Hypertension in her other; and Stroke in her other. Allergies  Allergen Reactions  . Codeine   . Morphine     REACTION: pt unsure of reaction  . Penicillins     REACTION: rash   Current Outpatient Prescriptions on File Prior to Visit  Medication Sig Dispense Refill  . aspirin 81 MG EC tablet Take 1 tablet (81 mg total) by mouth daily. Swallow whole.  30 tablet  12  . atorvastatin (LIPITOR) 40 MG tablet TAKE 1 TABLET BY MOUTH ONCE A DAY  90 tablet  2  . Calcium 1200-1000 MG-UNIT CHEW Chew by mouth daily.        Marland Kitchen losartan-hydrochlorothiazide (HYZAAR) 100-12.5 MG per tablet Take 1 tablet by mouth daily.  90 tablet  3  . metFORMIN (GLUCOPHAGE XR) 500 MG 24 hr tablet Take 2 tablets (1,000 mg total) by mouth daily with breakfast.  180 tablet  3   Review of Systems  Constitutional: Negative for diaphoresis and unexpected weight change.  HENT: Negative for tinnitus.   Eyes: Negative for photophobia and visual disturbance.  Respiratory: Negative for choking and stridor.   Gastrointestinal: Negative for vomiting and blood in stool.  Genitourinary: Negative for hematuria and decreased urine volume.  Musculoskeletal: Negative for gait problem. except for the above Skin: Negative for color change and wound.  Neurological: Negative for tremors and numbness.  Psychiatric/Behavioral: Negative for decreased concentration. The patient is not hyperactive.       Objective:   Physical Exam BP 110/78  Pulse 68  Temp 97.6 F (36.4 C) (Oral)  Ht 5\' 2"  (1.575 m)  Wt 224 lb (101.606 kg)  BMI 40.97 kg/m2  SpO2 97% Physical Exam  VS noted Constitutional: Pt appears well-developed and well-nourished. Lavella Lemons HENT: Head: Normocephalic.  Right Ear: External ear normal.  Left Ear: External ear normal.  Eyes: Conjunctivae and EOM are normal.  Pupils are equal, round, and reactive to light.  Neck: Normal range of motion. Neck supple.  Cardiovascular: Normal rate and regular rhythm.   Pulmonary/Chest: Effort normal and breath sounds normal.  Neurological: Pt is alert. Not confused  Skin: Skin is warm. No erythema.  Psychiatric: Pt behavior is normal. Thought content normal.     Assessment & Plan:

## 2012-05-07 NOTE — Assessment & Plan Note (Signed)
stable overall by hx and exam, most recent data reviewed with pt, and pt to continue medical treatment as before, ok for handicap for signed Lab Results  Component Value Date   WBC 6.6 10/27/2011   HGB 11.3* 10/27/2011   HCT 34.8* 10/27/2011   PLT 204.0 10/27/2011   GLUCOSE 95 10/27/2011   CHOL 104 10/27/2011   TRIG 56.0 10/27/2011   HDL 43.10 10/27/2011   LDLDIRECT 160.9 08/25/2006   LDLCALC 50 10/27/2011   ALT 12 10/27/2011   AST 14 10/27/2011   NA 144 10/27/2011   K 5.7* 10/27/2011   CL 108 10/27/2011   CREATININE 1.0 10/27/2011   BUN 32* 10/27/2011   CO2 29 10/27/2011   TSH 0.87 10/27/2011   HGBA1C 6.3 10/27/2011   MICROALBUR 0.7 10/27/2011

## 2012-05-07 NOTE — Assessment & Plan Note (Addendum)
stable overall by hx and exam, most recent data reviewed with pt, and pt to continue medical treatment as before except ok to change the actosplusmet to metformin to help with wt loss effort Lab Results  Component Value Date   HGBA1C 6.3 10/27/2011

## 2012-10-03 ENCOUNTER — Encounter: Payer: Self-pay | Admitting: Gastroenterology

## 2012-11-03 ENCOUNTER — Other Ambulatory Visit: Payer: Self-pay | Admitting: Internal Medicine

## 2013-02-06 ENCOUNTER — Other Ambulatory Visit: Payer: Self-pay | Admitting: Internal Medicine

## 2013-02-07 ENCOUNTER — Other Ambulatory Visit (INDEPENDENT_AMBULATORY_CARE_PROVIDER_SITE_OTHER): Payer: Medicare Other

## 2013-02-07 ENCOUNTER — Ambulatory Visit (INDEPENDENT_AMBULATORY_CARE_PROVIDER_SITE_OTHER): Payer: Medicare Other | Admitting: Internal Medicine

## 2013-02-07 ENCOUNTER — Encounter: Payer: Self-pay | Admitting: Internal Medicine

## 2013-02-07 VITALS — BP 122/70 | HR 61 | Temp 98.0°F | Ht 61.0 in | Wt 212.0 lb

## 2013-02-07 DIAGNOSIS — E119 Type 2 diabetes mellitus without complications: Secondary | ICD-10-CM

## 2013-02-07 DIAGNOSIS — Z0000 Encounter for general adult medical examination without abnormal findings: Secondary | ICD-10-CM

## 2013-02-07 DIAGNOSIS — Z23 Encounter for immunization: Secondary | ICD-10-CM

## 2013-02-07 LAB — CBC WITH DIFFERENTIAL/PLATELET
Basophils Absolute: 0 10*3/uL (ref 0.0–0.1)
Hemoglobin: 12.8 g/dL (ref 12.0–15.0)
Lymphocytes Relative: 25.2 % (ref 12.0–46.0)
Monocytes Relative: 8.4 % (ref 3.0–12.0)
Neutro Abs: 6 10*3/uL (ref 1.4–7.7)
RBC: 4.66 Mil/uL (ref 3.87–5.11)
RDW: 15.3 % — ABNORMAL HIGH (ref 11.5–14.6)

## 2013-02-07 LAB — HEPATIC FUNCTION PANEL
Albumin: 3.7 g/dL (ref 3.5–5.2)
Alkaline Phosphatase: 77 U/L (ref 39–117)
Total Protein: 7.3 g/dL (ref 6.0–8.3)

## 2013-02-07 LAB — LIPID PANEL
Cholesterol: 146 mg/dL (ref 0–200)
HDL: 41 mg/dL (ref >39.00)
Triglycerides: 131 mg/dL (ref 0.0–149.0)

## 2013-02-07 LAB — BASIC METABOLIC PANEL
CO2: 31 mEq/L (ref 19–32)
Calcium: 8.9 mg/dL (ref 8.4–10.5)
Creatinine, Ser: 0.8 mg/dL (ref 0.4–1.2)
Glucose, Bld: 109 mg/dL — ABNORMAL HIGH (ref 70–99)

## 2013-02-07 LAB — MICROALBUMIN / CREATININE URINE RATIO
Creatinine,U: 54.2 mg/dL
Microalb Creat Ratio: 1.5 mg/g (ref 0.0–30.0)

## 2013-02-07 LAB — URINALYSIS, ROUTINE W REFLEX MICROSCOPIC
Ketones, ur: NEGATIVE
Urine Glucose: NEGATIVE
Urobilinogen, UA: 0.2 (ref 0.0–1.0)

## 2013-02-07 MED ORDER — LOSARTAN POTASSIUM-HCTZ 100-12.5 MG PO TABS: 1.0000 | ORAL_TABLET | Freq: Every day | ORAL | Status: DC

## 2013-02-07 MED ORDER — METFORMIN HCL ER 500 MG PO TB24: 1000.0000 mg | ORAL_TABLET | Freq: Every day | ORAL | Status: DC

## 2013-02-07 MED ORDER — ATORVASTATIN CALCIUM 40 MG PO TABS: 40.0000 mg | ORAL_TABLET | Freq: Every day | ORAL | Status: DC

## 2013-02-07 NOTE — Patient Instructions (Addendum)
You had the flu shot today, and the Prevnar shot  Please continue all other medications as before, and refills have been done if requested. Please have the pharmacy call with any other refills you may need. Please continue your efforts at being more active, low cholesterol diet, and weight control. You are otherwise up to date with prevention measures today. Please go to the LAB in the Basement (turn left off the elevator) for the tests to be done today You will be contacted by phone if any changes need to be made immediately.  Otherwise, you will receive a letter about your results with an explanation, but please check with MyChart first.  Please remember to sign up for My Chart if you have not done so, as this will be important to you in the future with finding out test results, communicating by private email, and scheduling acute appointments online when needed.  Please return in 6 months, or sooner if needed, with Lab testing done 3-5 days before

## 2013-02-07 NOTE — Progress Notes (Signed)
Subjective:    Patient ID: Kathy Thomas, female    DOB: 07/22/1934, 79 y.o.   MRN: [id number]  HPI  Here for wellness and f/u;  Overall doing ok;  Pt denies CP, worsening SOB, DOE, wheezing, orthopnea, PND, worsening LE edema, palpitations, dizziness or syncope.  Pt denies neurological change such as new headache, facial or extremity weakness.  Pt denies polydipsia, polyuria, or low sugar symptoms. Pt states overall good compliance with treatment and medications, good tolerability, and has been trying to follow lower cholesterol diet.  Pt denies worsening depressive symptoms, suicidal ideation or panic. No fever, night sweats, wt loss, loss of appetite, or other constitutional symptoms.  Pt states good ability with ADL's, has low fall risk, home safety reviewed and adequate, no other significant changes in hearing or vision, and only occasionally active with exercise.  Had recent chest congestion now resolved. Had pneumoccal shot last yr.   Past Medical History  Diagnosis Date  . Acute sinusitis, unspecified 05/28/2008  . ANEMIA-IRON DEFICIENCY 10/26/2008  . ANXIETY 10/06/2007  . CHOLELITHIASIS 10/06/2007  . COLONIC POLYPS, HX OF 01/26/2007  . DIABETES MELLITUS, TYPE II 10/06/2007  . DIVERTICULOSIS, COLON 01/26/2007  . ECCHYMOSES, SPONTANEOUS 06/04/2010  . GERD 01/26/2007  . HERNIATED DISC 01/26/2007  . HYPERLIPIDEMIA 10/06/2007  . HYPERTENSION 01/26/2007  . LOW BACK PAIN 01/29/2007  . MENOPAUSAL DISORDER 10/26/2008  . OSTEOPOROSIS 06/04/2010  . SHOULDER PAIN, LEFT 10/06/2007   Past Surgical History  Procedure Laterality Date  . Appendectomy    . Abdominal hysterectomy    . Tonsillectomy    . Left elbow-nerve impingement  1985  . Left ankle-screw placement  1980  . Herniated disk   1995    C-spine  . Fracture left  wrist  2010    Dr. Amanda Pea    reports that she has been smoking.  She does not have any smokeless tobacco history on file. She reports that she does not drink alcohol or use illicit  drugs. family history includes Heart disease in her father; Hyperlipidemia in her other; Hypertension in her other; Stroke in her other. Allergies  Allergen Reactions  . Codeine   . Morphine     REACTION: pt unsure of reaction  . Penicillins     REACTION: rash   Current Outpatient Prescriptions on File Prior to Visit  Medication Sig Dispense Refill  . Calcium 1200-1000 MG-UNIT CHEW Chew by mouth daily.         No current facility-administered medications on file prior to visit.    Review of Systems Constitutional: Negative for diaphoresis, activity change, appetite change or unexpected weight change.  HENT: Negative for hearing loss, ear pain, facial swelling, mouth sores and neck stiffness.   Eyes: Negative for pain, redness and visual disturbance.  Respiratory: Negative for shortness of breath and wheezing.   Cardiovascular: Negative for chest pain and palpitations.  Gastrointestinal: Negative for diarrhea, blood in stool, abdominal distention or other pain Genitourinary: Negative for hematuria, flank pain or change in urine volume.  Musculoskeletal: Negative for myalgias and joint swelling.  Skin: Negative for color change and wound.  Neurological: Negative for syncope and numbness. other than noted Hematological: Negative for adenopathy.  Psychiatric/Behavioral: Negative for hallucinations, self-injury, decreased concentration and agitation.      Objective:   Physical Exam BP 122/70  Pulse 61  Temp(Src) 98 F (36.7 C) (Oral)  Ht 5\' 1"  (1.549 m)  Wt 212 lb (96.163 kg)  BMI 40.08 kg/m2  SpO2 94% VS  noted,  Constitutional: Pt is oriented to person, place, and time. Appears well-developed and well-nourished.  Head: Normocephalic and atraumatic.  Right Ear: External ear normal.  Left Ear: External ear normal.  Nose: Nose normal.  Mouth/Throat: Oropharynx is clear and moist.  Eyes: Conjunctivae and EOM are normal. Pupils are equal, round, and reactive to light.  Neck:  Normal range of motion. Neck supple. No JVD present. No tracheal deviation present.  Cardiovascular: Normal rate, regular rhythm, normal heart sounds and intact distal pulses.   Pulmonary/Chest: Effort normal and breath sounds normal.  Abdominal: Soft. Bowel sounds are normal. There is no tenderness. No HSM  Musculoskeletal: Normal range of motion. Exhibits no edema.  Lymphadenopathy:  Has no cervical adenopathy.  Neurological: Pt is alert and oriented to person, place, and time. Pt has normal reflexes. No cranial nerve deficit.  Skin: Skin is warm and dry. No rash noted.  Psychiatric:  Has  normal mood and affect. Behavior is normal.     Assessment & Plan:

## 2013-02-07 NOTE — Assessment & Plan Note (Signed)

## 2013-02-07 NOTE — Assessment & Plan Note (Signed)
stable overall by history and exam, recent data reviewed with pt, and pt to continue medical treatment as before,  to f/u any worsening symptoms or concerns Lab Results  Component Value Date   HGBA1C 7.0* 02/07/2013

## 2013-07-10 ENCOUNTER — Other Ambulatory Visit: Payer: Self-pay | Admitting: Internal Medicine

## 2013-07-13 ENCOUNTER — Ambulatory Visit (INDEPENDENT_AMBULATORY_CARE_PROVIDER_SITE_OTHER): Payer: Medicare Other | Admitting: Internal Medicine

## 2013-07-13 ENCOUNTER — Encounter: Payer: Self-pay | Admitting: Internal Medicine

## 2013-07-13 ENCOUNTER — Other Ambulatory Visit (INDEPENDENT_AMBULATORY_CARE_PROVIDER_SITE_OTHER): Payer: Medicare Other

## 2013-07-13 VITALS — BP 140/80 | HR 69 | Temp 97.6°F | Ht 60.0 in | Wt 208.5 lb

## 2013-07-13 DIAGNOSIS — E1165 Type 2 diabetes mellitus with hyperglycemia: Secondary | ICD-10-CM

## 2013-07-13 DIAGNOSIS — IMO0001 Reserved for inherently not codable concepts without codable children: Secondary | ICD-10-CM

## 2013-07-13 DIAGNOSIS — Z0000 Encounter for general adult medical examination without abnormal findings: Secondary | ICD-10-CM

## 2013-07-13 DIAGNOSIS — I10 Essential (primary) hypertension: Secondary | ICD-10-CM

## 2013-07-13 DIAGNOSIS — E119 Type 2 diabetes mellitus without complications: Secondary | ICD-10-CM

## 2013-07-13 DIAGNOSIS — E785 Hyperlipidemia, unspecified: Secondary | ICD-10-CM

## 2013-07-13 LAB — BASIC METABOLIC PANEL
BUN: 13 mg/dL (ref 6–23)
CHLORIDE: 98 meq/L (ref 96–112)
CO2: 29 mEq/L (ref 19–32)
Calcium: 9.6 mg/dL (ref 8.4–10.5)
Creatinine, Ser: 0.8 mg/dL (ref 0.4–1.2)
GFR: 70.99 mL/min (ref >60.00)
Glucose, Bld: 124 mg/dL — ABNORMAL HIGH (ref 70–99)
POTASSIUM: 5 meq/L (ref 3.5–5.1)
Sodium: 137 mEq/L (ref 135–145)

## 2013-07-13 LAB — HEPATIC FUNCTION PANEL
ALK PHOS: 67 U/L (ref 39–117)
ALT: 12 U/L (ref 0–35)
AST: 16 U/L (ref 0–37)
Albumin: 3.7 g/dL (ref 3.5–5.2)
BILIRUBIN DIRECT: 0 mg/dL (ref 0.0–0.3)
BILIRUBIN TOTAL: 0.5 mg/dL (ref 0.3–1.2)
TOTAL PROTEIN: 7.4 g/dL (ref 6.0–8.3)

## 2013-07-13 LAB — LIPID PANEL
CHOL/HDL RATIO: 3
Cholesterol: 150 mg/dL (ref 0–200)
HDL: 50.6 mg/dL (ref >39.00)
LDL Cholesterol: 79 mg/dL (ref 0–99)
Triglycerides: 101 mg/dL (ref 0.0–149.0)
VLDL: 20.2 mg/dL (ref 0.0–40.0)

## 2013-07-13 LAB — HEMOGLOBIN A1C: HEMOGLOBIN A1C: 6.9 % — AB (ref 4.6–6.5)

## 2013-07-13 MED ORDER — METFORMIN HCL ER 500 MG PO TB24: ORAL_TABLET | ORAL | Status: DC

## 2013-07-13 NOTE — Patient Instructions (Signed)
Please continue all other medications as before, and refills have been done if requested. Please have the pharmacy call with any other refills you may need. Please continue your efforts at being more active, low cholesterol diet, and weight control.  Please go to the LAB in the Basement (turn left off the elevator) for the tests to be done today You will be contacted by phone if any changes need to be made immediately.  Otherwise, you will receive a letter about your results with an explanation, but please check with MyChart first.  Please return in 6 months, or sooner if needed, with Lab testing done 3-5 days before

## 2013-07-13 NOTE — Progress Notes (Signed)
Subjective:    Patient ID: Mickie BailBetty R Harb, female    DOB: 01-07-1935, 79 y.o.   MRN: [id number]  HPI  Here to f/u; overall doing ok,  Pt denies chest pain, increased sob or doe, wheezing, orthopnea, PND, increased LE swelling, palpitations, dizziness or syncope.  Pt denies polydipsia, polyuria, or low sugar symptoms such as weakness or confusion improved with po intake.  Pt denies new neurological symptoms such as new headache, or facial or extremity weakness or numbness.   Pt states overall good compliance with meds, has been trying to follow lower cholesterol, diabetic diet, with wt overall stable,  but little exercise however. No acute complaints Past Medical History  Diagnosis Date  . Acute sinusitis, unspecified 05/28/2008  . ANEMIA-IRON DEFICIENCY 10/26/2008  . ANXIETY 10/06/2007  . CHOLELITHIASIS 10/06/2007  . COLONIC POLYPS, HX OF 01/26/2007  . DIABETES MELLITUS, TYPE II 10/06/2007  . DIVERTICULOSIS, COLON 01/26/2007  . ECCHYMOSES, SPONTANEOUS 06/04/2010  . GERD 01/26/2007  . HERNIATED DISC 01/26/2007  . HYPERLIPIDEMIA 10/06/2007  . HYPERTENSION 01/26/2007  . LOW BACK PAIN 01/29/2007  . MENOPAUSAL DISORDER 10/26/2008  . OSTEOPOROSIS 06/04/2010  . SHOULDER PAIN, LEFT 10/06/2007   Past Surgical History  Procedure Laterality Date  . Appendectomy    . Abdominal hysterectomy    . Tonsillectomy    . Left elbow-nerve impingement  1985  . Left ankle-screw placement  1980  . Herniated disk   1995    C-spine  . Fracture left  wrist  2010    Dr. Amanda PeaGramig    reports that she has been smoking.  She does not have any smokeless tobacco history on file. She reports that she does not drink alcohol or use illicit drugs. family history includes Heart disease in her father; Hyperlipidemia in her other; Hypertension in her other; Stroke in her other. Allergies  Allergen Reactions  . Codeine   . Morphine     REACTION: pt unsure of reaction  . Penicillins     REACTION: rash   Current Outpatient Prescriptions  on File Prior to Visit  Medication Sig Dispense Refill  . atorvastatin (LIPITOR) 40 MG tablet Take 1 tablet (40 mg total) by mouth daily.  90 tablet  3  . Calcium 1200-1000 MG-UNIT CHEW Chew by mouth daily.        Marland Kitchen. losartan-hydrochlorothiazide (HYZAAR) 100-12.5 MG per tablet Take 1 tablet by mouth daily.  90 tablet  3   No current facility-administered medications on file prior to visit.   Review of Systems  Constitutional: Negative for unexpected weight change, or unusual diaphoresis  HENT: Negative for tinnitus.   Eyes: Negative for photophobia and visual disturbance.  Respiratory: Negative for choking and stridor.   Gastrointestinal: Negative for vomiting and blood in stool.  Genitourinary: Negative for hematuria and decreased urine volume.  Musculoskeletal: Negative for acute joint swelling Skin: Negative for color change and wound.  Neurological: Negative for tremors and numbness other than noted  Psychiatric/Behavioral: Negative for decreased concentration or  hyperactivity.       Objective:   Physical Exam BP 140/80  Pulse 69  Temp(Src) 97.6 F (36.4 C) (Oral)  Ht 5' (1.524 m)  Wt 208 lb 8 oz (94.575 kg)  BMI 40.72 kg/m2  SpO2 93% VS noted,  Constitutional: Pt appears well-developed and well-nourished.  HENT: Head: NCAT.  Right Ear: External ear normal.  Left Ear: External ear normal.  Eyes: Conjunctivae and EOM are normal. Pupils are equal, round, and reactive to light.  Neck: Normal range of motion. Neck supple.  Cardiovascular: Normal rate and regular rhythm.   Pulmonary/Chest: Effort normal and breath sounds normal.  Abd:  Soft, NT, non-distended, + BS Neurological: Pt is alert. Not confused  Skin: Skin is warm. No erythema.  Psychiatric: Pt behavior is normal. Thought content normal.     Assessment & Plan:

## 2013-07-13 NOTE — Progress Notes (Signed)
Pre-visit discussion using our clinic review tool. No additional management support is needed unless otherwise documented below in the visit note.  

## 2013-07-15 NOTE — Assessment & Plan Note (Signed)
stable overall by history and exam, recent data reviewed with pt, and pt to continue medical treatment as before,  to f/u any worsening symptoms or concerns BP Readings from Last 3 Encounters:  07/13/13 140/80  02/07/13 122/70  05/06/12 110/78

## 2013-07-15 NOTE — Assessment & Plan Note (Signed)
stable overall by history and exam, recent data reviewed with pt, and pt to continue medical treatment as before,  to f/u any worsening symptoms or concerns Lab Results  Component Value Date   LDLCALC 79 07/13/2013

## 2013-07-15 NOTE — Assessment & Plan Note (Signed)
stable overall by history and exam, recent data reviewed with pt, and pt to continue medical treatment as before,  to f/u any worsening symptoms or concerns Lab Results  Component Value Date   HGBA1C 6.9* 07/13/2013    

## 2013-08-08 ENCOUNTER — Ambulatory Visit: Payer: Medicare Other | Admitting: Internal Medicine

## 2013-08-19 ENCOUNTER — Other Ambulatory Visit: Payer: Self-pay | Admitting: Internal Medicine

## 2013-08-21 NOTE — Telephone Encounter (Signed)
Refill done.  

## 2013-09-08 ENCOUNTER — Encounter: Payer: Self-pay | Admitting: Internal Medicine

## 2013-09-08 ENCOUNTER — Ambulatory Visit (INDEPENDENT_AMBULATORY_CARE_PROVIDER_SITE_OTHER): Payer: Medicare Other | Admitting: Internal Medicine

## 2013-09-08 VITALS — BP 128/62 | HR 88 | Wt 203.0 lb

## 2013-09-08 DIAGNOSIS — I10 Essential (primary) hypertension: Secondary | ICD-10-CM

## 2013-09-08 DIAGNOSIS — E119 Type 2 diabetes mellitus without complications: Secondary | ICD-10-CM

## 2013-09-08 DIAGNOSIS — F0390 Unspecified dementia without behavioral disturbance: Secondary | ICD-10-CM

## 2013-09-08 MED ORDER — DONEPEZIL HCL 5 MG PO TABS: 5.0000 mg | ORAL_TABLET | Freq: Every day | ORAL | Status: DC

## 2013-09-08 MED ORDER — ASPIRIN EC 81 MG PO TBEC: 81.0000 mg | DELAYED_RELEASE_TABLET | Freq: Every day | ORAL | Status: DC

## 2013-09-08 NOTE — Progress Notes (Signed)
Subjective:    Patient ID: Kathy BailBetty R Thomas, female    DOB: 11-03-1934, 79 y.o.   MRN: [id number]  HPI  Here with daughter; pt lives with second daughter who had to leave, but family relates pt with 3-6 mo worsening short term memory loss mostly of which the pt is unaware and argumentative and denies.  Has gotten lost several times driving away from home, had to call family to help her home.  Repeats questions she has just been answered.  Has less personal hygeine habits.  Is able to name the purpose of her meds, and longer term memory seems intact.  Has had no significant sleep or wandering issues.  Denies worsening depressive symptoms, suicidal ideation, or panic; has ongoing anxiety, not increased recently. and not assoc with behavioral changes such as hallucinations, paranoia, or agitation. Past Medical History  Diagnosis Date  . Acute sinusitis, unspecified 05/28/2008  . ANEMIA-IRON DEFICIENCY 10/26/2008  . ANXIETY 10/06/2007  . CHOLELITHIASIS 10/06/2007  . COLONIC POLYPS, HX OF 01/26/2007  . DIABETES MELLITUS, TYPE II 10/06/2007  . DIVERTICULOSIS, COLON 01/26/2007  . ECCHYMOSES, SPONTANEOUS 06/04/2010  . GERD 01/26/2007  . HERNIATED DISC 01/26/2007  . HYPERLIPIDEMIA 10/06/2007  . HYPERTENSION 01/26/2007  . LOW BACK PAIN 01/29/2007  . MENOPAUSAL DISORDER 10/26/2008  . OSTEOPOROSIS 06/04/2010  . SHOULDER PAIN, LEFT 10/06/2007   Past Surgical History  Procedure Laterality Date  . Appendectomy    . Abdominal hysterectomy    . Tonsillectomy    . Left elbow-nerve impingement  1985  . Left ankle-screw placement  1980  . Herniated disk   1995    C-spine  . Fracture left  wrist  2010    Dr. Amanda PeaGramig    reports that she has been smoking Cigarettes.  She has been smoking about 0.50 packs per day. She does not have any smokeless tobacco history on file. She reports that she does not drink alcohol or use illicit drugs. family history includes Heart disease in her father; Hyperlipidemia in her other;  Hypertension in her other; Stroke in her other. Allergies  Allergen Reactions  . Codeine   . Morphine     REACTION: pt unsure of reaction  . Penicillins     REACTION: rash   Current Outpatient Prescriptions on File Prior to Visit  Medication Sig Dispense Refill  . atorvastatin (LIPITOR) 40 MG tablet Take 1 tablet (40 mg total) by mouth daily.  90 tablet  3  . Calcium 1200-1000 MG-UNIT CHEW Chew by mouth daily.        Marland Kitchen. losartan-hydrochlorothiazide (HYZAAR) 100-12.5 MG per tablet TAKE 1 TABLET BY MOUTH ONCE DAILY  90 tablet  1  . metFORMIN (GLUCOPHAGE-XR) 500 MG 24 hr tablet TAKE 2 TABLETS BY MOUTH ONCE DAILY WITH BREAKFAST  180 tablet  3   No current facility-administered medications on file prior to visit.   Review of Systems  Constitutional: Negative for unexpected weight change, or unusual diaphoresis  HENT: Negative for tinnitus.   Eyes: Negative for photophobia and visual disturbance.  Respiratory: Negative for choking and stridor.   Gastrointestinal: Negative for vomiting and blood in stool.  Genitourinary: Negative for hematuria and decreased urine volume.  Musculoskeletal: Negative for acute joint swelling Skin: Negative for color change and wound.  Neurological: Negative for tremors and numbness other than noted  Psychiatric/Behavioral: Negative for decreased concentration or  hyperactivity.       Objective:   Physical Exam BP 128/62  Pulse 88  Wt 203 lb (  92.08 kg) VS noted,  Constitutional: Pt appears well-developed and well-nourished.  HENT: Head: NCAT.  Right Ear: External ear normal.  Left Ear: External ear normal.  Eyes: Conjunctivae and EOM are normal. Pupils are equal, round, and reactive to light.  Neck: Normal range of motion. Neck supple.  Cardiovascular: Normal rate and regular rhythm.   Pulmonary/Chest: Effort normal and breath sounds normal.  Abd:  Soft, NT, non-distended, + BS Neurological: Pt is alert. Not confused  Skin: Skin is warm. No  erythema.  Psychiatric: Pt behavior is normal. Thought content normal.     Assessment & Plan:

## 2013-09-08 NOTE — Assessment & Plan Note (Signed)
stable overall by history and exam, recent data reviewed with pt, and pt to continue medical treatment as before,  to f/u any worsening symptoms or concerns BP Readings from Last 3 Encounters:  09/08/13 128/62  07/13/13 140/80  02/07/13 122/70

## 2013-09-08 NOTE — Assessment & Plan Note (Signed)
Recent onset, mild, has mutl labs dones in the past yr, also for B12 today, head MRI - r/o NPH, aricept 5 mg, consider neuro referral, f/u 1 mo

## 2013-09-08 NOTE — Patient Instructions (Addendum)
Please take all new medication as prescribed  - the aricept 5 mg per day  Please continue all other medications as before, and refills have been done if requested. Please have the pharmacy call with any other refills you may need.  Please go to the LAB in the Basement (turn left off the elevator) for the tests to be done today  You will be contacted by phone if any changes need to be made immediately.  Otherwise, you will receive a letter about your results with an explanation, but please check with MyChart first.  You will be contacted regarding the referral for: Head MRI  Please return in 1 months, or sooner if needed (the office will call next wk)

## 2013-09-08 NOTE — Progress Notes (Signed)
Pre visit review using our clinic review tool, if applicable. No additional management support is needed unless otherwise documented below in the visit note. 

## 2013-09-08 NOTE — Assessment & Plan Note (Signed)
stable overall by history and exam, recent data reviewed with pt, and pt to continue medical treatment as before,  to f/u any worsening symptoms or concerns Lab Results  Component Value Date   HGBA1C 6.9* 07/13/2013

## 2013-09-11 ENCOUNTER — Other Ambulatory Visit (INDEPENDENT_AMBULATORY_CARE_PROVIDER_SITE_OTHER): Payer: Medicare Other

## 2013-09-11 ENCOUNTER — Encounter: Payer: Self-pay | Admitting: Internal Medicine

## 2013-09-11 DIAGNOSIS — F0390 Unspecified dementia without behavioral disturbance: Secondary | ICD-10-CM

## 2013-09-11 DIAGNOSIS — E538 Deficiency of other specified B group vitamins: Secondary | ICD-10-CM | POA: Insufficient documentation

## 2013-09-11 LAB — VITAMIN B12: Vitamin B-12: 127 pg/mL — ABNORMAL LOW (ref 211–911)

## 2013-09-20 ENCOUNTER — Telehealth: Payer: Self-pay

## 2013-09-20 NOTE — Telephone Encounter (Signed)
Relevant patient education assigned to patient using Emmi. ° °

## 2013-09-26 ENCOUNTER — Other Ambulatory Visit: Payer: Medicare Other

## 2013-10-03 ENCOUNTER — Ambulatory Visit
Admission: RE | Admit: 2013-10-03 | Discharge: 2013-10-03 | Disposition: A | Discharge status: Home / Self Care | Payer: Medicare Other | Source: Ambulatory Visit | Attending: Internal Medicine | Admitting: Internal Medicine

## 2013-10-03 DIAGNOSIS — F0390 Unspecified dementia without behavioral disturbance: Secondary | ICD-10-CM

## 2013-10-11 ENCOUNTER — Encounter: Payer: Self-pay | Admitting: Internal Medicine

## 2013-10-11 ENCOUNTER — Ambulatory Visit (INDEPENDENT_AMBULATORY_CARE_PROVIDER_SITE_OTHER): Payer: Medicare Other | Admitting: Internal Medicine

## 2013-10-11 VITALS — BP 122/80 | HR 69 | Temp 97.7°F | Ht 62.0 in | Wt 199.0 lb

## 2013-10-11 DIAGNOSIS — L723 Sebaceous cyst: Secondary | ICD-10-CM

## 2013-10-11 DIAGNOSIS — E538 Deficiency of other specified B group vitamins: Secondary | ICD-10-CM

## 2013-10-11 DIAGNOSIS — I10 Essential (primary) hypertension: Secondary | ICD-10-CM

## 2013-10-11 DIAGNOSIS — Z0000 Encounter for general adult medical examination without abnormal findings: Secondary | ICD-10-CM

## 2013-10-11 DIAGNOSIS — L720 Epidermal cyst: Secondary | ICD-10-CM

## 2013-10-11 DIAGNOSIS — F0390 Unspecified dementia without behavioral disturbance: Secondary | ICD-10-CM

## 2013-10-11 MED ORDER — DONEPEZIL HCL 10 MG PO TABS: 10.0000 mg | ORAL_TABLET | Freq: Every day | ORAL | Status: DC

## 2013-10-11 NOTE — Progress Notes (Signed)
Pre visit review using our clinic review tool, if applicable. No additional management support is needed unless otherwise documented below in the visit note. 

## 2013-10-11 NOTE — Progress Notes (Signed)
Subjective:    Patient ID: Kathy Thomas, female    DOB: 1935-01-30, 79 y.o.   MRN: [id number]  HPI   Here to f/u with daughter, tolerating the aricept well, no diarrhea or other side effect.  Pt denies chest pain, increased sob or doe, wheezing, orthopnea, PND, increased LE swelling, palpitations, dizziness or syncope.  Pt denies new neurological symptoms such as new headache, or facial or extremity weakness or numbness   Pt denies polydipsia, polyuria.  Was found to have B12 deficiency, not yet tx.  Also has a cyst left upper back , drained again last night, still some mild tender today Past Medical History  Diagnosis Date  . Acute sinusitis, unspecified 05/28/2008  . ANEMIA-IRON DEFICIENCY 10/26/2008  . ANXIETY 10/06/2007  . CHOLELITHIASIS 10/06/2007  . COLONIC POLYPS, HX OF 01/26/2007  . DIABETES MELLITUS, TYPE II 10/06/2007  . DIVERTICULOSIS, COLON 01/26/2007  . ECCHYMOSES, SPONTANEOUS 06/04/2010  . GERD 01/26/2007  . HERNIATED DISC 01/26/2007  . HYPERLIPIDEMIA 10/06/2007  . HYPERTENSION 01/26/2007  . LOW BACK PAIN 01/29/2007  . MENOPAUSAL DISORDER 10/26/2008  . OSTEOPOROSIS 06/04/2010  . SHOULDER PAIN, LEFT 10/06/2007   Past Surgical History  Procedure Laterality Date  . Appendectomy    . Abdominal hysterectomy    . Tonsillectomy    . Left elbow-nerve impingement  1985  . Left ankle-screw placement  1980  . Herniated disk   1995    C-spine  . Fracture left  wrist  2010    Dr. Amanda PeaGramig    reports that she has been smoking Cigarettes.  She has been smoking about 0.50 packs per day. She does not have any smokeless tobacco history on file. She reports that she does not drink alcohol or use illicit drugs. family history includes Heart disease in her father; Hyperlipidemia in her other; Hypertension in her other; Stroke in her other. Allergies  Allergen Reactions  . Codeine   . Morphine     REACTION: pt unsure of reaction  . Penicillins     REACTION: rash   Current Outpatient Prescriptions on  File Prior to Visit  Medication Sig Dispense Refill  . aspirin EC 81 MG tablet Take 1 tablet (81 mg total) by mouth daily.  90 tablet  11  . atorvastatin (LIPITOR) 40 MG tablet Take 1 tablet (40 mg total) by mouth daily.  90 tablet  3  . Calcium 1200-1000 MG-UNIT CHEW Chew by mouth daily.        Marland Kitchen. losartan-hydrochlorothiazide (HYZAAR) 100-12.5 MG per tablet TAKE 1 TABLET BY MOUTH ONCE DAILY  90 tablet  1  . metFORMIN (GLUCOPHAGE-XR) 500 MG 24 hr tablet TAKE 2 TABLETS BY MOUTH ONCE DAILY WITH BREAKFAST  180 tablet  3   No current facility-administered medications on file prior to visit.   Review of Systems  Constitutional: Negative for unusual diaphoresis or other sweats  HENT: Negative for ringing in ear Eyes: Negative for double vision or worsening visual disturbance.  Respiratory: Negative for choking and stridor.   Gastrointestinal: Negative for vomiting or other signifcant bowel change Genitourinary: Negative for hematuria or decreased urine volume.  Musculoskeletal: Negative for other MSK pain or swelling Skin: Negative for color change and worsening wound.  Neurological: Negative for tremors and numbness other than noted  Psychiatric/Behavioral: Negative for decreased concentration or agitation other than above       Objective:   Physical Exam BP 122/80  Pulse 69  Temp(Src) 97.7 F (36.5 C) (Oral)  Ht 5'  2" (1.575 m)  Wt 199 lb (90.266 kg)  BMI 36.39 kg/m2  SpO2 92% VS noted,  Constitutional: Pt appears well-developed, well-nourished.  HENT: Head: NCAT.  Right Ear: External ear normal.  Left Ear: External ear normal.  Eyes: . Pupils are equal, round, and reactive to light. Conjunctivae and EOM are normal Neck: Normal range of motion. Neck supple.  Cardiovascular: Normal rate and regular rhythm.   Pulmonary/Chest: Effort normal and breath sounds normal.  - no rales or wheezing Neurological: Pt is alert. Not confused , motor grossly intact Skin: Skin is warm. No rash.  Left upper back/trapezoid area with approx 1 cm cystic lesion recently drained, nonfluctuant or indurated now Psychiatric: Pt behavior is normal. No agitation.     Assessment & Plan:

## 2013-10-11 NOTE — Patient Instructions (Addendum)
OK to increase the aricept to 10 mg per day  Please take a B12 vitamin OTC (or a B complex multivitamin OTC) - 1 per day  You will be contacted regarding the referral for: mammogram, and the general surgury referral  Please continue all other medications as before, and refills have been done if requested. Please have the pharmacy call with any other refills you may need.  Please continue your efforts at being more active, low cholesterol diet, and weight control.  Please return in 3 months, or sooner if needed, with blood work done 3-5 days ahead

## 2013-10-16 DIAGNOSIS — L720 Epidermal cyst: Secondary | ICD-10-CM | POA: Insufficient documentation

## 2013-10-16 NOTE — Assessment & Plan Note (Signed)
For otc b12 vitamin daily, indefintie, for re-check in 6-12 mo

## 2013-10-16 NOTE — Assessment & Plan Note (Signed)
stable overall by history and exam, recent data reviewed with pt, and pt to continue medical treatment as before,  to f/u any worsening symptoms or concerns BP Readings from Last 3 Encounters:  10/11/13 122/80  09/08/13 128/62  07/13/13 140/80

## 2013-10-16 NOTE — Assessment & Plan Note (Signed)
Ok for increased aricept to 10 qd,  to f/u any worsening symptoms or concerns

## 2013-10-16 NOTE — Assessment & Plan Note (Signed)
Ok for gen surg referrakl for definitive management per pt/daughter request

## 2013-10-18 ENCOUNTER — Ambulatory Visit (INDEPENDENT_AMBULATORY_CARE_PROVIDER_SITE_OTHER): Payer: Medicare Other | Admitting: General Surgery

## 2013-11-13 ENCOUNTER — Ambulatory Visit (INDEPENDENT_AMBULATORY_CARE_PROVIDER_SITE_OTHER): Payer: Medicare Other | Admitting: General Surgery

## 2013-11-17 ENCOUNTER — Other Ambulatory Visit: Payer: Self-pay | Admitting: Internal Medicine

## 2013-12-05 ENCOUNTER — Other Ambulatory Visit: Payer: Self-pay | Admitting: Internal Medicine

## 2014-01-10 ENCOUNTER — Other Ambulatory Visit (INDEPENDENT_AMBULATORY_CARE_PROVIDER_SITE_OTHER): Payer: Medicare Other

## 2014-01-10 ENCOUNTER — Encounter: Payer: Self-pay | Admitting: Internal Medicine

## 2014-01-10 ENCOUNTER — Ambulatory Visit (INDEPENDENT_AMBULATORY_CARE_PROVIDER_SITE_OTHER): Payer: Medicare Other | Admitting: Internal Medicine

## 2014-01-10 VITALS — BP 122/76 | HR 63 | Temp 97.9°F | Ht 62.0 in | Wt 198.5 lb

## 2014-01-10 DIAGNOSIS — Z0000 Encounter for general adult medical examination without abnormal findings: Secondary | ICD-10-CM

## 2014-01-10 DIAGNOSIS — E119 Type 2 diabetes mellitus without complications: Secondary | ICD-10-CM

## 2014-01-10 DIAGNOSIS — F0390 Unspecified dementia without behavioral disturbance: Secondary | ICD-10-CM

## 2014-01-10 DIAGNOSIS — R269 Unspecified abnormalities of gait and mobility: Secondary | ICD-10-CM | POA: Insufficient documentation

## 2014-01-10 DIAGNOSIS — E1165 Type 2 diabetes mellitus with hyperglycemia: Secondary | ICD-10-CM

## 2014-01-10 DIAGNOSIS — IMO0001 Reserved for inherently not codable concepts without codable children: Secondary | ICD-10-CM

## 2014-01-10 DIAGNOSIS — E538 Deficiency of other specified B group vitamins: Secondary | ICD-10-CM

## 2014-01-10 DIAGNOSIS — Z23 Encounter for immunization: Secondary | ICD-10-CM

## 2014-01-10 LAB — BASIC METABOLIC PANEL
BUN: 12 mg/dL (ref 6–23)
CHLORIDE: 98 meq/L (ref 96–112)
CO2: 33 mEq/L — ABNORMAL HIGH (ref 19–32)
CREATININE: 0.9 mg/dL (ref 0.4–1.2)
Calcium: 9.3 mg/dL (ref 8.4–10.5)
GFR: 68.05 mL/min (ref >60.00)
Glucose, Bld: 108 mg/dL — ABNORMAL HIGH (ref 70–99)
Potassium: 5.6 mEq/L — ABNORMAL HIGH (ref 3.5–5.1)
Sodium: 138 mEq/L (ref 135–145)

## 2014-01-10 LAB — LIPID PANEL
CHOL/HDL RATIO: 3
Cholesterol: 128 mg/dL (ref 0–200)
HDL: 46.7 mg/dL (ref >39.00)
LDL CALC: 63 mg/dL (ref 0–99)
NonHDL: 81.3
TRIGLYCERIDES: 91 mg/dL (ref 0.0–149.0)
VLDL: 18.2 mg/dL (ref 0.0–40.0)

## 2014-01-10 LAB — MICROALBUMIN / CREATININE URINE RATIO
Creatinine,U: 94.9 mg/dL
Microalb Creat Ratio: 1.8 mg/g (ref 0.0–30.0)
Microalb, Ur: 1.7 mg/dL (ref 0.0–1.9)

## 2014-01-10 LAB — HEPATIC FUNCTION PANEL
ALT: 16 U/L (ref 0–35)
AST: 20 U/L (ref 0–37)
Albumin: 3.7 g/dL (ref 3.5–5.2)
Alkaline Phosphatase: 91 U/L (ref 39–117)
BILIRUBIN TOTAL: 0.6 mg/dL (ref 0.2–1.2)
Bilirubin, Direct: 0.1 mg/dL (ref 0.0–0.3)
Total Protein: 7.5 g/dL (ref 6.0–8.3)

## 2014-01-10 LAB — CBC WITH DIFFERENTIAL/PLATELET
BASOS PCT: 0.4 % (ref 0.0–3.0)
Basophils Absolute: 0 10*3/uL (ref 0.0–0.1)
Eosinophils Absolute: 0.2 10*3/uL (ref 0.0–0.7)
Eosinophils Relative: 2.3 % (ref 0.0–5.0)
HCT: 39.8 % (ref 36.0–46.0)
Hemoglobin: 12.9 g/dL (ref 12.0–15.0)
LYMPHS PCT: 21 % (ref 12.0–46.0)
Lymphs Abs: 1.9 10*3/uL (ref 0.7–4.0)
MCHC: 32.5 g/dL (ref 30.0–36.0)
MCV: 86.8 fl (ref 78.0–100.0)
MONO ABS: 0.7 10*3/uL (ref 0.1–1.0)
Monocytes Relative: 8 % (ref 3.0–12.0)
Neutro Abs: 6.1 10*3/uL (ref 1.4–7.7)
Neutrophils Relative %: 68.3 % (ref 43.0–77.0)
Platelets: 264 10*3/uL (ref 150.0–400.0)
RBC: 4.59 Mil/uL (ref 3.87–5.11)
RDW: 14.7 % (ref 11.5–15.5)
WBC: 8.9 10*3/uL (ref 4.0–10.5)

## 2014-01-10 LAB — HEMOGLOBIN A1C: Hgb A1c MFr Bld: 6.6 % — ABNORMAL HIGH (ref 4.6–6.5)

## 2014-01-10 LAB — TSH: TSH: 1.02 u[IU]/mL (ref 0.35–4.50)

## 2014-01-10 NOTE — Assessment & Plan Note (Signed)

## 2014-01-10 NOTE — Progress Notes (Signed)
Pre visit review using our clinic review tool, if applicable. No additional management support is needed unless otherwise documented below in the visit note. 

## 2014-01-10 NOTE — Patient Instructions (Addendum)
You had the flu shot today  You will be contacted regarding the referral for: mammogram, and Physical Therapy  Please continue all other medications as before, and refills have been done if requested.  Please have the pharmacy call with any other refills you may need.  Please continue your efforts at being more active, low cholesterol diet, and weight control.  You are otherwise up to date with prevention measures today.  Please keep your appointments with your specialists as you may have planned  Please go to the LAB in the Basement (turn left off the elevator) for the tests to be done today  You will be contacted by phone if any changes need to be made immediately.  Otherwise, you will receive a letter about your results with an explanation, but please check with MyChart first.  Please remember to sign up for MyChart if you have not done so, as this will be important to you in the future with finding out test results, communicating by private email, and scheduling acute appointments online when needed.  Please return in 6 months, or sooner if needed

## 2014-01-10 NOTE — Assessment & Plan Note (Signed)
Encourage taking otc oral b12 daily

## 2014-01-10 NOTE — Progress Notes (Signed)
Subjective:    Patient ID: Kathy Thomas, female    DOB: 06-Jan-1935, 79 y.o.   MRN: [id number]  HPI   Here for wellness and f/u;  Overall doing ok;  Pt denies CP, worsening SOB, DOE, wheezing, orthopnea, PND, worsening LE edema, palpitations, dizziness or syncope.  Pt denies neurological change such as new headache, facial or extremity weakness.  Pt denies polydipsia, polyuria, or low sugar symptoms. Pt states overall good compliance with treatment and medications, good tolerability, and has been trying to follow lower cholesterol diet.  Pt denies worsening depressive symptoms, suicidal ideation or panic. No fever, night sweats, wt loss, loss of appetite, or other constitutional symptoms.  Pt states good ability with ADL's, has low fall risk, home safety reviewed and adequate, no other significant changes in hearing or vision, and only occasionally active with exercise.  Has cane and walker at home.  No recent falls but afraid every day.  Last PT some time ago.  Some general weakness recently, though left LBP and left sciatica with some LLE weakness has improved overall.  Not clear if taking her OTC b12, states she started well, but has gotten away from it recenlty.  Has lost 4 lbs since April 2015 with appetite not so much for sweets now.  Still smoking but only very occasionally, will prob quit soon per pt. Dementia overall stable symptomatically with ? gradual worsening, and not assoc with behavioral changes such as hallucinations, paranoia, or agitation.  Past Medical History  Diagnosis Date  . Acute sinusitis, unspecified 05/28/2008  . ANEMIA-IRON DEFICIENCY 10/26/2008  . ANXIETY 10/06/2007  . CHOLELITHIASIS 10/06/2007  . COLONIC POLYPS, HX OF 01/26/2007  . DIABETES MELLITUS, TYPE II 10/06/2007  . DIVERTICULOSIS, COLON 01/26/2007  . ECCHYMOSES, SPONTANEOUS 06/04/2010  . GERD 01/26/2007  . HERNIATED DISC 01/26/2007  . HYPERLIPIDEMIA 10/06/2007  . HYPERTENSION 01/26/2007  . LOW BACK PAIN 01/29/2007  .  MENOPAUSAL DISORDER 10/26/2008  . OSTEOPOROSIS 06/04/2010  . SHOULDER PAIN, LEFT 10/06/2007   Past Surgical History  Procedure Laterality Date  . Appendectomy    . Abdominal hysterectomy    . Tonsillectomy    . Left elbow-nerve impingement  1985  . Left ankle-screw placement  1980  . Herniated disk   1995    C-spine  . Fracture left  wrist  2010    Dr. Amanda PeaGramig    reports that she has been smoking Cigarettes.  She has been smoking about 0.50 packs per day. She does not have any smokeless tobacco history on file. She reports that she does not drink alcohol or use illicit drugs. family history includes Heart disease in her father; Hyperlipidemia in her other; Hypertension in her other; Stroke in her other. Allergies  Allergen Reactions  . Codeine   . Morphine     REACTION: pt unsure of reaction  . Penicillins     REACTION: rash   Current Outpatient Prescriptions on File Prior to Visit  Medication Sig Dispense Refill  . aspirin EC 81 MG tablet Take 1 tablet (81 mg total) by mouth daily.  90 tablet  11  . atorvastatin (LIPITOR) 40 MG tablet TAKE 1 TABLET BY MOUTH ONCE A DAY  90 tablet  3  . Calcium 1200-1000 MG-UNIT CHEW Chew by mouth daily.        Marland Kitchen. donepezil (ARICEPT) 10 MG tablet Take 1 tablet (10 mg total) by mouth at bedtime.  90 tablet  3  . losartan-hydrochlorothiazide (HYZAAR) 100-12.5 MG per tablet TAKE  1 TABLET BY MOUTH ONCE A DAY  90 tablet  3  . metFORMIN (GLUCOPHAGE-XR) 500 MG 24 hr tablet TAKE 2 TABLETS BY MOUTH ONCE DAILY WITH BREAKFAST  180 tablet  3   No current facility-administered medications on file prior to visit.   Review of Systems Constitutional: Negative for increased diaphoresis, other activity, appetite or other siginficant weight change  HENT: Negative for worsening hearing loss, ear pain, facial swelling, mouth sores and neck stiffness.   Eyes: Negative for other worsening pain, redness or visual disturbance.  Respiratory: Negative for shortness of breath  and wheezing.   Cardiovascular: Negative for chest pain and palpitations.  Gastrointestinal: Negative for diarrhea, blood in stool, abdominal distention or other pain Genitourinary: Negative for hematuria, flank pain or change in urine volume.  Musculoskeletal: Negative for myalgias or other joint complaints.  Skin: Negative for color change and wound.  Neurological: Negative for syncope and numbness. other than noted Hematological: Negative for adenopathy. or other swelling Psychiatric/Behavioral: Negative for hallucinations, self-injury, decreased concentration or other worsening agitation.      Objective:   Physical Exam BP 122/76  Pulse 63  Temp(Src) 97.9 F (36.6 C) (Oral)  Ht 5\' 2"  (1.575 m)  Wt 198 lb 8 oz (90.039 kg)  BMI 36.30 kg/m2  SpO2 95% VS noted,  Constitutional: Pt is oriented to person, place, and time. Appears well-developed and well-nourished.  Head: Normocephalic and atraumatic.  Right Ear: External ear normal.  Left Ear: External ear normal.  Nose: Nose normal.  Mouth/Throat: Oropharynx is clear and moist.  Eyes: Conjunctivae and EOM are normal. Pupils are equal, round, and reactive to light.  Neck: Normal range of motion. Neck supple. No JVD present. No tracheal deviation present.  Cardiovascular: Normal rate, regular rhythm, normal heart sounds and intact distal pulses.   Pulmonary/Chest: Effort normal and breath sounds without rales or wheezing  Abdominal: Soft. Bowel sounds are normal. NT. No HSM  Musculoskeletal: Normal range of motion. Exhibits no edema.  Lymphadenopathy:  Has no cervical adenopathy.  Neurological: Pt is alert and oriented to person, place only, confused about recent events. Pt has normal reflexes. No cranial nerve deficit. Motor grossly intact but unsteady to ambulate Skin: Skin is warm and dry. No rash noted.  Psychiatric:  Has normal mood and affect. Behavior is normal.     Assessment & Plan:   Wt Readings from Last 3 Encounters:   01/10/14 198 lb 8 oz (90.039 kg)  10/11/13 199 lb (90.266 kg)  09/08/13 203 lb (92.08 kg)

## 2014-01-10 NOTE — Assessment & Plan Note (Signed)
prob mild worsening unsteady, for outpt PT eval

## 2014-01-10 NOTE — Assessment & Plan Note (Signed)
stable overall by history and exam, recent data reviewed with pt, and pt to continue medical treatment as before,  to f/u any worsening symptoms or concerns Lab Results  Component Value Date   HGBA1C 6.9* 07/13/2013   For f/u lab

## 2014-01-10 NOTE — Assessment & Plan Note (Signed)
stable overall by history and exam, and pt to continue medical treatment as before,  to f/u any worsening symptoms or concerns 

## 2014-01-11 ENCOUNTER — Other Ambulatory Visit: Payer: Self-pay | Admitting: Internal Medicine

## 2014-01-11 DIAGNOSIS — Z1231 Encounter for screening mammogram for malignant neoplasm of breast: Secondary | ICD-10-CM

## 2014-02-07 ENCOUNTER — Encounter: Payer: Self-pay | Admitting: Internal Medicine

## 2014-03-09 ENCOUNTER — Other Ambulatory Visit: Payer: Self-pay

## 2014-05-24 ENCOUNTER — Other Ambulatory Visit: Payer: Self-pay | Admitting: Internal Medicine

## 2014-07-04 ENCOUNTER — Ambulatory Visit (INDEPENDENT_AMBULATORY_CARE_PROVIDER_SITE_OTHER): Payer: Medicare Other | Admitting: Internal Medicine

## 2014-07-04 ENCOUNTER — Inpatient Hospital Stay (HOSPITAL_COMMUNITY)
Admission: EM | Admit: 2014-07-04 | Discharge: 2014-07-08 | DRG: 563 | Disposition: A | Discharge status: Skilled Nursing Facility | Payer: Medicare Other | Attending: Internal Medicine | Admitting: Internal Medicine

## 2014-07-04 ENCOUNTER — Encounter (HOSPITAL_COMMUNITY): Payer: Self-pay | Admitting: *Deleted

## 2014-07-04 ENCOUNTER — Emergency Department (HOSPITAL_COMMUNITY): Payer: Medicare Other

## 2014-07-04 VITALS — BP 130/90 | HR 78 | Temp 98.1°F

## 2014-07-04 DIAGNOSIS — M25572 Pain in left ankle and joints of left foot: Secondary | ICD-10-CM

## 2014-07-04 DIAGNOSIS — F419 Anxiety disorder, unspecified: Secondary | ICD-10-CM | POA: Diagnosis present

## 2014-07-04 DIAGNOSIS — S82402A Unspecified fracture of shaft of left fibula, initial encounter for closed fracture: Secondary | ICD-10-CM | POA: Diagnosis not present

## 2014-07-04 DIAGNOSIS — Z8249 Family history of ischemic heart disease and other diseases of the circulatory system: Secondary | ICD-10-CM

## 2014-07-04 DIAGNOSIS — W19XXXA Unspecified fall, initial encounter: Secondary | ICD-10-CM | POA: Diagnosis present

## 2014-07-04 DIAGNOSIS — K219 Gastro-esophageal reflux disease without esophagitis: Secondary | ICD-10-CM | POA: Diagnosis present

## 2014-07-04 DIAGNOSIS — S82409A Unspecified fracture of shaft of unspecified fibula, initial encounter for closed fracture: Secondary | ICD-10-CM | POA: Diagnosis present

## 2014-07-04 DIAGNOSIS — M25562 Pain in left knee: Secondary | ICD-10-CM | POA: Diagnosis not present

## 2014-07-04 DIAGNOSIS — Z823 Family history of stroke: Secondary | ICD-10-CM

## 2014-07-04 DIAGNOSIS — Z885 Allergy status to narcotic agent status: Secondary | ICD-10-CM

## 2014-07-04 DIAGNOSIS — E538 Deficiency of other specified B group vitamins: Secondary | ICD-10-CM | POA: Diagnosis present

## 2014-07-04 DIAGNOSIS — Z79899 Other long term (current) drug therapy: Secondary | ICD-10-CM

## 2014-07-04 DIAGNOSIS — Z7982 Long term (current) use of aspirin: Secondary | ICD-10-CM

## 2014-07-04 DIAGNOSIS — I10 Essential (primary) hypertension: Secondary | ICD-10-CM | POA: Diagnosis present

## 2014-07-04 DIAGNOSIS — F0390 Unspecified dementia without behavioral disturbance: Secondary | ICD-10-CM | POA: Diagnosis present

## 2014-07-04 DIAGNOSIS — Z86010 Personal history of colonic polyps: Secondary | ICD-10-CM

## 2014-07-04 DIAGNOSIS — Z880 Allergy status to penicillin: Secondary | ICD-10-CM

## 2014-07-04 DIAGNOSIS — F40240 Claustrophobia: Secondary | ICD-10-CM | POA: Diagnosis present

## 2014-07-04 DIAGNOSIS — I739 Peripheral vascular disease, unspecified: Secondary | ICD-10-CM | POA: Diagnosis present

## 2014-07-04 DIAGNOSIS — E119 Type 2 diabetes mellitus without complications: Secondary | ICD-10-CM | POA: Diagnosis present

## 2014-07-04 DIAGNOSIS — I5030 Unspecified diastolic (congestive) heart failure: Secondary | ICD-10-CM | POA: Diagnosis present

## 2014-07-04 DIAGNOSIS — R531 Weakness: Secondary | ICD-10-CM

## 2014-07-04 DIAGNOSIS — M810 Age-related osteoporosis without current pathological fracture: Secondary | ICD-10-CM | POA: Diagnosis present

## 2014-07-04 DIAGNOSIS — Z79891 Long term (current) use of opiate analgesic: Secondary | ICD-10-CM

## 2014-07-04 DIAGNOSIS — N390 Urinary tract infection, site not specified: Secondary | ICD-10-CM | POA: Diagnosis present

## 2014-07-04 DIAGNOSIS — S93402A Sprain of unspecified ligament of left ankle, initial encounter: Secondary | ICD-10-CM

## 2014-07-04 DIAGNOSIS — R0989 Other specified symptoms and signs involving the circulatory and respiratory systems: Secondary | ICD-10-CM | POA: Diagnosis present

## 2014-07-04 DIAGNOSIS — E785 Hyperlipidemia, unspecified: Secondary | ICD-10-CM | POA: Diagnosis present

## 2014-07-04 DIAGNOSIS — Z87891 Personal history of nicotine dependence: Secondary | ICD-10-CM

## 2014-07-04 DIAGNOSIS — Z9181 History of falling: Secondary | ICD-10-CM

## 2014-07-04 DIAGNOSIS — Y92009 Unspecified place in unspecified non-institutional (private) residence as the place of occurrence of the external cause: Secondary | ICD-10-CM

## 2014-07-04 DIAGNOSIS — I5022 Chronic systolic (congestive) heart failure: Secondary | ICD-10-CM | POA: Diagnosis present

## 2014-07-04 LAB — CBC WITH DIFFERENTIAL/PLATELET
Basophils Absolute: 0 10*3/uL (ref 0.0–0.1)
Basophils Relative: 0 % (ref 0–1)
Eosinophils Absolute: 0.2 10*3/uL (ref 0.0–0.7)
Eosinophils Relative: 2 % (ref 0–5)
HCT: 39.2 % (ref 36.0–46.0)
Hemoglobin: 12.1 g/dL (ref 12.0–15.0)
Lymphocytes Relative: 17 % (ref 12–46)
Lymphs Abs: 1.6 10*3/uL (ref 0.7–4.0)
MCH: 27.4 pg (ref 26.0–34.0)
MCHC: 30.9 g/dL (ref 30.0–36.0)
MCV: 88.9 fL (ref 78.0–100.0)
Monocytes Absolute: 1.3 10*3/uL — ABNORMAL HIGH (ref 0.1–1.0)
Monocytes Relative: 13 % — ABNORMAL HIGH (ref 3–12)
Neutro Abs: 6.5 10*3/uL (ref 1.7–7.7)
Neutrophils Relative %: 68 % (ref 43–77)
PLATELETS: 240 10*3/uL (ref 150–400)
RBC: 4.41 MIL/uL (ref 3.87–5.11)
RDW: 13.3 % (ref 11.5–15.5)
WBC: 9.7 10*3/uL (ref 4.0–10.5)

## 2014-07-04 LAB — URINE MICROSCOPIC-ADD ON

## 2014-07-04 LAB — URINALYSIS, ROUTINE W REFLEX MICROSCOPIC
Glucose, UA: 250 mg/dL — AB
Hgb urine dipstick: NEGATIVE
Ketones, ur: NEGATIVE mg/dL
NITRITE: NEGATIVE
PH: 5 (ref 5.0–8.0)
Protein, ur: NEGATIVE mg/dL
SPECIFIC GRAVITY, URINE: 1.026 (ref 1.005–1.030)
Urobilinogen, UA: 1 mg/dL (ref 0.0–1.0)

## 2014-07-04 MED ORDER — HYDROCODONE-ACETAMINOPHEN 5-325 MG PO TABS: 1.0000 | ORAL_TABLET | Freq: Four times a day (QID) | ORAL | Status: DC | PRN

## 2014-07-04 NOTE — ED Notes (Signed)
Family reports fall Sunday, went to an orthopedic Monday for her L ankle and L knee.  THey reports she had an ankle xray but not her knee.  Reports bruising on her L knee and swelling.  Family reports pt was sent here to the ED by her PCP.  They report that pt lives by herself and ambulates with a walker.  They are wondering if she needs to go to a rehab facility d/t this injury.  She reports pt has been weak since the fall and is unable to ambulate without assist.

## 2014-07-04 NOTE — Patient Instructions (Signed)
Please take all new medication as prescribed - the pain medication  Please go to the XRAY Department in the Basement (go straight as you get off the elevator) for the x-ray testing  Please go to the LAB in the Basement (turn left off the elevator) for the tests to be done   You will be contacted by phone if any changes need to be made immediately.  Otherwise, you will receive a letter about your results with an explanation, but please check with MyChart first.  Please remember to sign up for MyChart if you have not done so, as this will be important to you in the future with finding out test results, communicating by private email, and scheduling acute appointments online when needed.

## 2014-07-04 NOTE — Progress Notes (Signed)
Pre visit review using our clinic review tool, if applicable. No additional management support is needed unless otherwise documented below in the visit note. 

## 2014-07-04 NOTE — ED Provider Notes (Signed)
CSN: [id number]     Arrival date & time 07/04/14  1902 History   First MD Initiated Contact with Patient 07/04/14 2014     Chief Complaint  Patient presents with  . Fall     (Consider location/radiation/quality/duration/timing/severity/associated sxs/prior Treatment) HPI  Kathy Thomas is a(n) 80 y.o. female who presents to the Ed after fall. Fall occurred 3 days ago. Patient suffered a mechanical fall at home and had immediate pain and swelling of the left ankle. She was evaluated by an orthopedist and placed in a cam walker boot. At that time, she only had her left ankle evaluated. Yesterday she developed pain and swelling in the left knee as well. The patient has a past medical history of dementia, diabetes, low back pain, obesity. She lives with her daughter. The daughter states that she has frequent falls at home. Since her fall on Sunday. She has been unable to ambulate without significant help. She does use a walker but still requires a 2 people to help her. The daughter states she is extremely unsteady and she fears for her safety. The patient complains of left ankle and left knee pain. She denies hitting her head or losing consciousness. The patient's fall was witnessed by family.  Past Medical History  Diagnosis Date  . Acute sinusitis, unspecified 05/28/2008  . ANEMIA-IRON DEFICIENCY 10/26/2008  . ANXIETY 10/06/2007  . CHOLELITHIASIS 10/06/2007  . COLONIC POLYPS, HX OF 01/26/2007  . DIABETES MELLITUS, TYPE II 10/06/2007  . DIVERTICULOSIS, COLON 01/26/2007  . ECCHYMOSES, SPONTANEOUS 06/04/2010  . GERD 01/26/2007  . HERNIATED DISC 01/26/2007  . HYPERLIPIDEMIA 10/06/2007  . HYPERTENSION 01/26/2007  . LOW BACK PAIN 01/29/2007  . MENOPAUSAL DISORDER 10/26/2008  . OSTEOPOROSIS 06/04/2010  . SHOULDER PAIN, LEFT 10/06/2007   Past Surgical History  Procedure Laterality Date  . Appendectomy    . Abdominal hysterectomy    . Tonsillectomy    . Left elbow-nerve impingement  1985  . Left ankle-screw  placement  1980  . Herniated disk   1995    C-spine  . Fracture left  wrist  2010    Dr. Amanda Pea   Family History  Problem Relation Age of Onset  . Heart disease Father   . Hyperlipidemia Other   . Stroke Other   . Hypertension Other    History  Substance Use Topics  . Smoking status: Former Smoker -- 0.00 packs/day    Types: Cigarettes    Quit date: 06/29/2014  . Smokeless tobacco: Not on file  . Alcohol Use: No   OB History    No data available     Review of Systems  Ten systems reviewed and are negative for acute change, except as noted in the HPI.    Allergies  Codeine; Morphine; and Penicillins  Home Medications   Prior to Admission medications   Medication Sig Start Date End Date Taking? Authorizing Provider  aspirin EC 81 MG tablet Take 1 tablet (81 mg total) by mouth daily. 09/08/13  Yes [name], MD  atorvastatin (LIPITOR) 40 MG tablet TAKE 1 TABLET BY MOUTH ONCE A DAY 11/17/13  Yes [name], MD  Calcium 1200-1000 MG-UNIT CHEW Chew by mouth daily.     Yes Historical Provider, MD  donepezil (ARICEPT) 10 MG tablet Take 1 tablet (10 mg total) by mouth at bedtime. 10/11/13  Yes [name], MD  losartan-hydrochlorothiazide [facility]) 100-12.5 MG per tablet TAKE 1 TABLET BY MOUTH ONCE A DAY 12/05/13  Yes Fayrene Fearing  Ellin Mayhew, MD  metFORMIN (GLUCOPHAGE-XR) 500 MG 24 hr tablet TAKE 2 TABLETS BY MOUTH ONCE DAILY WITH BREAKFAST 07/13/13  Yes [name], MD  HYDROcodone-acetaminophen (NORCO/VICODIN) 5-325 MG per tablet Take 1 tablet by mouth every 6 (six) hours as needed for moderate pain. Patient not taking: Reported on 07/04/2014 07/04/14   [name], MD   BP 143/63 mmHg  Pulse 81  Temp(Src) 98.1 F (36.7 C) (Oral)  Resp 20  SpO2 97% Physical Exam  Constitutional: She is oriented to person, place, and time. She appears well-developed and well-nourished. No distress.  HENT:  Head: Normocephalic and atraumatic.  Eyes: Conjunctivae are normal. No scleral icterus.   Neck: Normal range of motion.  Cardiovascular: Normal rate, regular rhythm and normal heart sounds.  Exam reveals no gallop and no friction rub.   No murmur heard. Pulmonary/Chest: Effort normal and breath sounds normal. No respiratory distress.  Abdominal: Soft. Bowel sounds are normal. She exhibits no distension and no mass. There is no tenderness. There is no guarding.  Musculoskeletal:       Left knee: She exhibits decreased range of motion, swelling, effusion, ecchymosis and bony tenderness. Tenderness found. Lateral joint line tenderness noted.       Left ankle: She exhibits decreased range of motion, swelling and ecchymosis. Tenderness. Medial malleolus and proximal fibula tenderness found.  Neurological: She is alert and oriented to person, place, and time.  Skin: Skin is warm and dry. She is not diaphoretic.    ED Course  Procedures (including critical care time) Labs Review Labs Reviewed - No data to display  Imaging Review Dg Ankle Complete Left  07/04/2014   CLINICAL DATA:  Patient fell on Sunday and went to [facility] orthopedics on Monday. Diagnosed as a sprain. Swelling and bruising have increased since then. Increased pain now radiating up to the left knee.  EXAM: LEFT ANKLE COMPLETE - 3+ VIEW  COMPARISON:  None.  FINDINGS: Old postoperative changes with screw and wire fixation of the distal fibula. Diffuse bone demineralization. Degenerative changes in the left ankle and intertarsal joints. Plantar and Achilles spurs of the calcaneus. Mild diffuse soft tissue swelling about the left ankle. No acute fracture or dislocation is appreciated. Vascular calcifications.  IMPRESSION: Diffuse degenerative change and demineralization in the left ankle. Soft tissue swelling. No acute fractures identified.   Electronically Signed   By: Burman Nieves M.D.   On: 07/04/2014 20:30   Dg Knee Complete 4 Views Left  07/04/2014   CLINICAL DATA:  Fall on Sunday.  Ankle pain on bruising and  swelling  EXAM: LEFT KNEE - COMPLETE 4+ VIEW  COMPARISON:  None.  FINDINGS: No fracture of the proximal tibia or distal femur. Patella is normal. No joint effusion. There is a subtle lucency at the metadiaphysis of the proximal fibula concerning for nondisplaced fracture.  IMPRESSION: Probable nondisplaced fracture of the proximal fibula. No evidence of fracture at the knee joint.   Electronically Signed   By: Genevive Bi M.D.   On: 07/04/2014 20:28     EKG Interpretation None      MDM   Final diagnoses:  Fall  Closed fibular fracture, left, initial encounter  Ankle sprain, left, initial encounter    11:54 PM Patient with Fibular head fracture. She has great difficulty with ambulation even with assistive device and is unable to ambulate safely.  She spends many hours at home alone without assistance.  The patient also requires 2 persons to assist with  ambulation and transfer. Her labs show hyperglycemia and are otherwise unremarkable.   \ 1:40 AM BP 147/62 mmHg  Pulse 67  Temp(Src) 98.1 F (36.7 C) (Oral)  Resp 18  SpO2 94%  I spoke with Dr. Charlann Boxerlin who asks to have the patient placed in Ace wrap and knee immobilizer. Patient admitted by Dr. Benard Rinkdoutova  Marene Gilliam, PA-C 07/05/14 0142  Richardean Canalavid H Yao, MD 07/06/14 [phone]

## 2014-07-04 NOTE — ED Notes (Signed)
Patient transported to CT 

## 2014-07-04 NOTE — Progress Notes (Signed)
Subjective:    Patient ID: Mickie BailBetty R Lips, female    DOB: [birth year], 80 y.o.   MRN: [id number]  HPI Here after recent fall, had immediate left ankle swelling, saw ortho with family but family states ortho did not actually examine the ankle besides visually after neg film by report, very angry today.  Also struck left knee and now that that has bruised and increased swelling in last few days after the ortho visit, daughter is very unhappy that something was missed.  Does not think knee or leg films done, only ankle. Pt unable to walk.  Xray is closed after hrs at this visit at my facility.  Daughter asks also for UA but lab closed at this time as well. Denies urinary symptoms such as dysuria, frequency, urgency, flank pain, hematuria or n/v, fever, chills. Past Medical History  Diagnosis Date  . Acute sinusitis, unspecified 05/28/2008  . ANEMIA-IRON DEFICIENCY 10/26/2008  . ANXIETY 10/06/2007  . CHOLELITHIASIS 10/06/2007  . COLONIC POLYPS, HX OF 01/26/2007  . DIABETES MELLITUS, TYPE II 10/06/2007  . DIVERTICULOSIS, COLON 01/26/2007  . ECCHYMOSES, SPONTANEOUS 06/04/2010  . GERD 01/26/2007  . HERNIATED DISC 01/26/2007  . HYPERLIPIDEMIA 10/06/2007  . HYPERTENSION 01/26/2007  . LOW BACK PAIN 01/29/2007  . MENOPAUSAL DISORDER 10/26/2008  . OSTEOPOROSIS 06/04/2010  . SHOULDER PAIN, LEFT 10/06/2007   Past Surgical History  Procedure Laterality Date  . Appendectomy    . Abdominal hysterectomy    . Tonsillectomy    . Left elbow-nerve impingement  1985  . Left ankle-screw placement  1980  . Herniated disk   1995    C-spine  . Fracture left  wrist  2010    Dr. Amanda PeaGramig    reports that she quit smoking 9 days ago. Her smoking use included Cigarettes. She smoked 0.00 packs per day. She does not have any smokeless tobacco history on file. She reports that she does not drink alcohol or use illicit drugs. family history includes Heart disease in her father; Hyperlipidemia in her other; Hypertension in her other;  Stroke in her other. Allergies  Allergen Reactions  . Codeine Hives  . Morphine     REACTION: pt unsure of reaction  . Penicillins     REACTION: rash   Current Outpatient Prescriptions on File Prior to Visit  Medication Sig Dispense Refill  . aspirin EC 81 MG tablet Take 1 tablet (81 mg total) by mouth daily. 90 tablet 11  . atorvastatin (LIPITOR) 40 MG tablet TAKE 1 TABLET BY MOUTH ONCE A DAY 90 tablet 3  . Calcium 1200-1000 MG-UNIT CHEW Chew by mouth daily.      Marland Kitchen. donepezil (ARICEPT) 10 MG tablet Take 1 tablet (10 mg total) by mouth at bedtime. 90 tablet 3  . losartan-hydrochlorothiazide (HYZAAR) 100-12.5 MG per tablet TAKE 1 TABLET BY MOUTH ONCE A DAY 90 tablet 3  . metFORMIN (GLUCOPHAGE-XR) 500 MG 24 hr tablet TAKE 2 TABLETS BY MOUTH ONCE DAILY WITH BREAKFAST 180 tablet 3   No current facility-administered medications on file prior to visit.   Review of Systems  pt unable due to dementia, family states o/w neg   Objective:   Physical Exam BP 130/90 mmHg  Pulse 78  Temp(Src) 98.1 F (36.7 C) (Oral)  Wt  VS noted,  Constitutional: Pt appears well-developed, well-nourished.  HENT: Head: NCAT.  Right Ear: External ear normal.  Left Ear: External ear normal.  Eyes: . Pupils are equal, round, and reactive to light. Conjunctivae and EOM  are normal Neck: Normal range of motion. Neck supple.  Cardiovascular: Normal rate and regular rhythm.   Pulmonary/Chest: Effort normal and breath sounds without rales or wheezing.  Abd:  Soft, NT, ND, + BS Neurological: Pt is alert. Not confused , motor grossly intact Skin: Skin is warm. No rash Psychiatric: Pt behavior is normal. No agitation.  Left ankle with 2+ swelling more medially Left knee with anterior bruise, and 2+ ? prox Leg swelling vs knee effusion    Assessment & Plan:

## 2014-07-05 ENCOUNTER — Inpatient Hospital Stay (HOSPITAL_COMMUNITY): Payer: Medicare Other

## 2014-07-05 ENCOUNTER — Encounter (HOSPITAL_COMMUNITY): Payer: Self-pay | Admitting: Internal Medicine

## 2014-07-05 DIAGNOSIS — Y92009 Unspecified place in unspecified non-institutional (private) residence as the place of occurrence of the external cause: Secondary | ICD-10-CM | POA: Diagnosis not present

## 2014-07-05 DIAGNOSIS — R0989 Other specified symptoms and signs involving the circulatory and respiratory systems: Secondary | ICD-10-CM | POA: Diagnosis present

## 2014-07-05 DIAGNOSIS — Z885 Allergy status to narcotic agent status: Secondary | ICD-10-CM | POA: Diagnosis not present

## 2014-07-05 DIAGNOSIS — Z880 Allergy status to penicillin: Secondary | ICD-10-CM | POA: Diagnosis not present

## 2014-07-05 DIAGNOSIS — K219 Gastro-esophageal reflux disease without esophagitis: Secondary | ICD-10-CM | POA: Diagnosis present

## 2014-07-05 DIAGNOSIS — I10 Essential (primary) hypertension: Secondary | ICD-10-CM | POA: Diagnosis present

## 2014-07-05 DIAGNOSIS — M25562 Pain in left knee: Secondary | ICD-10-CM | POA: Diagnosis present

## 2014-07-05 DIAGNOSIS — Z7982 Long term (current) use of aspirin: Secondary | ICD-10-CM | POA: Diagnosis not present

## 2014-07-05 DIAGNOSIS — E538 Deficiency of other specified B group vitamins: Secondary | ICD-10-CM | POA: Diagnosis present

## 2014-07-05 DIAGNOSIS — Z79899 Other long term (current) drug therapy: Secondary | ICD-10-CM | POA: Diagnosis not present

## 2014-07-05 DIAGNOSIS — S82402A Unspecified fracture of shaft of left fibula, initial encounter for closed fracture: Secondary | ICD-10-CM | POA: Diagnosis present

## 2014-07-05 DIAGNOSIS — E119 Type 2 diabetes mellitus without complications: Secondary | ICD-10-CM | POA: Diagnosis present

## 2014-07-05 DIAGNOSIS — Z86010 Personal history of colonic polyps: Secondary | ICD-10-CM | POA: Diagnosis not present

## 2014-07-05 DIAGNOSIS — F0390 Unspecified dementia without behavioral disturbance: Secondary | ICD-10-CM | POA: Diagnosis not present

## 2014-07-05 DIAGNOSIS — F419 Anxiety disorder, unspecified: Secondary | ICD-10-CM | POA: Diagnosis present

## 2014-07-05 DIAGNOSIS — Z79891 Long term (current) use of opiate analgesic: Secondary | ICD-10-CM | POA: Diagnosis not present

## 2014-07-05 DIAGNOSIS — Z8249 Family history of ischemic heart disease and other diseases of the circulatory system: Secondary | ICD-10-CM | POA: Diagnosis not present

## 2014-07-05 DIAGNOSIS — F40240 Claustrophobia: Secondary | ICD-10-CM | POA: Diagnosis present

## 2014-07-05 DIAGNOSIS — I739 Peripheral vascular disease, unspecified: Secondary | ICD-10-CM | POA: Diagnosis present

## 2014-07-05 DIAGNOSIS — Z823 Family history of stroke: Secondary | ICD-10-CM | POA: Diagnosis not present

## 2014-07-05 DIAGNOSIS — N390 Urinary tract infection, site not specified: Secondary | ICD-10-CM | POA: Diagnosis present

## 2014-07-05 DIAGNOSIS — I5022 Chronic systolic (congestive) heart failure: Secondary | ICD-10-CM | POA: Diagnosis present

## 2014-07-05 DIAGNOSIS — E785 Hyperlipidemia, unspecified: Secondary | ICD-10-CM | POA: Diagnosis present

## 2014-07-05 DIAGNOSIS — S82409A Unspecified fracture of shaft of unspecified fibula, initial encounter for closed fracture: Secondary | ICD-10-CM | POA: Diagnosis present

## 2014-07-05 DIAGNOSIS — Z9181 History of falling: Secondary | ICD-10-CM | POA: Diagnosis not present

## 2014-07-05 DIAGNOSIS — Z87891 Personal history of nicotine dependence: Secondary | ICD-10-CM | POA: Diagnosis not present

## 2014-07-05 DIAGNOSIS — M810 Age-related osteoporosis without current pathological fracture: Secondary | ICD-10-CM | POA: Diagnosis present

## 2014-07-05 DIAGNOSIS — W19XXXA Unspecified fall, initial encounter: Secondary | ICD-10-CM | POA: Diagnosis present

## 2014-07-05 LAB — URINALYSIS, ROUTINE W REFLEX MICROSCOPIC
BILIRUBIN URINE: NEGATIVE
GLUCOSE, UA: 500 mg/dL — AB
Ketones, ur: NEGATIVE mg/dL
NITRITE: NEGATIVE
PH: 6 (ref 5.0–8.0)
Protein, ur: NEGATIVE mg/dL
SPECIFIC GRAVITY, URINE: 1.02 (ref 1.005–1.030)
Urobilinogen, UA: 1 mg/dL (ref 0.0–1.0)

## 2014-07-05 LAB — COMPREHENSIVE METABOLIC PANEL
ALT: 11 U/L (ref 0–35)
ANION GAP: 9 (ref 5–15)
AST: 15 U/L (ref 0–37)
Albumin: 3.3 g/dL — ABNORMAL LOW (ref 3.5–5.2)
Alkaline Phosphatase: 65 U/L (ref 39–117)
BILIRUBIN TOTAL: 0.7 mg/dL (ref 0.3–1.2)
BUN: 24 mg/dL — AB (ref 6–23)
CHLORIDE: 101 mmol/L (ref 96–112)
CO2: 30 mmol/L (ref 19–32)
Calcium: 8.3 mg/dL — ABNORMAL LOW (ref 8.4–10.5)
Creatinine, Ser: 0.91 mg/dL (ref 0.50–1.10)
GFR calc Af Amer: 69 mL/min — ABNORMAL LOW (ref >90)
GFR calc non Af Amer: 59 mL/min — ABNORMAL LOW (ref >90)
Glucose, Bld: 138 mg/dL — ABNORMAL HIGH (ref 70–99)
POTASSIUM: 3.6 mmol/L (ref 3.5–5.1)
SODIUM: 140 mmol/L (ref 135–145)
TOTAL PROTEIN: 6.5 g/dL (ref 6.0–8.3)

## 2014-07-05 LAB — BASIC METABOLIC PANEL
Anion gap: 13 (ref 5–15)
BUN: 26 mg/dL — AB (ref 6–23)
CO2: 27 mmol/L (ref 19–32)
Calcium: 8.7 mg/dL (ref 8.4–10.5)
Chloride: 102 mmol/L (ref 96–112)
Creatinine, Ser: 0.95 mg/dL (ref 0.50–1.10)
GFR calc Af Amer: 65 mL/min — ABNORMAL LOW (ref >90)
GFR calc non Af Amer: 56 mL/min — ABNORMAL LOW (ref >90)
GLUCOSE: 165 mg/dL — AB (ref 70–99)
Potassium: 3.9 mmol/L (ref 3.5–5.1)
Sodium: 142 mmol/L (ref 135–145)

## 2014-07-05 LAB — I-STAT CHEM 8, ED
BUN: 27 mg/dL — ABNORMAL HIGH (ref 6–23)
Calcium, Ion: 1.06 mmol/L — ABNORMAL LOW (ref 1.13–1.30)
Chloride: 99 mmol/L (ref 96–112)
Creatinine, Ser: 0.8 mg/dL (ref 0.50–1.10)
GLUCOSE: 131 mg/dL — AB (ref 70–99)
HEMATOCRIT: 40 % (ref 36.0–46.0)
Hemoglobin: 13.6 g/dL (ref 12.0–15.0)
POTASSIUM: 3.9 mmol/L (ref 3.5–5.1)
Sodium: 140 mmol/L (ref 135–145)
TCO2: 27 mmol/L (ref 0–100)

## 2014-07-05 LAB — GLUCOSE, CAPILLARY
Glucose-Capillary: 145 mg/dL — ABNORMAL HIGH (ref 70–99)
Glucose-Capillary: 130 mg/dL — ABNORMAL HIGH (ref 70–99)
Glucose-Capillary: 154 mg/dL — ABNORMAL HIGH (ref 70–99)
Glucose-Capillary: 134 mg/dL — ABNORMAL HIGH (ref 70–99)
Glucose-Capillary: 108 mg/dL — ABNORMAL HIGH (ref 70–99)

## 2014-07-05 LAB — CBC
HCT: 34.9 % — ABNORMAL LOW (ref 36.0–46.0)
HEMOGLOBIN: 10.8 g/dL — AB (ref 12.0–15.0)
MCH: 27.4 pg (ref 26.0–34.0)
MCHC: 30.9 g/dL (ref 30.0–36.0)
MCV: 88.6 fL (ref 78.0–100.0)
Platelets: 253 10*3/uL (ref 150–400)
RBC: 3.94 MIL/uL (ref 3.87–5.11)
RDW: 13.3 % (ref 11.5–15.5)
WBC: 8.6 10*3/uL (ref 4.0–10.5)

## 2014-07-05 LAB — PHOSPHORUS: PHOSPHORUS: 3.4 mg/dL (ref 2.3–4.6)

## 2014-07-05 LAB — URINE MICROSCOPIC-ADD ON

## 2014-07-05 LAB — CK TOTAL AND CKMB (NOT AT ARMC)
CK, MB: 1.3 ng/mL (ref 0.3–4.0)
Relative Index: INVALID (ref 0.0–2.5)
Total CK: 52 U/L (ref 7–177)

## 2014-07-05 LAB — TSH: TSH: 0.877 u[IU]/mL (ref 0.350–4.500)

## 2014-07-05 LAB — MAGNESIUM: MAGNESIUM: 1.7 mg/dL (ref 1.5–2.5)

## 2014-07-05 LAB — TROPONIN I

## 2014-07-05 MED ORDER — HYDROCODONE-ACETAMINOPHEN 5-325 MG PO TABS: 1.0000 | ORAL_TABLET | Freq: Four times a day (QID) | ORAL | Status: DC | PRN

## 2014-07-05 MED ORDER — ENOXAPARIN SODIUM 40 MG/0.4ML ~~LOC~~ SOLN
40.0000 mg | Freq: Every day | SUBCUTANEOUS | Status: DC
  Administered 2014-07-05 – 2014-07-08 (×4): 40 mg via SUBCUTANEOUS
  Filled 2014-07-05 (×4): qty 0.4

## 2014-07-05 MED ORDER — HYDROCODONE-ACETAMINOPHEN 5-325 MG PO TABS
1.0000 | ORAL_TABLET | ORAL | Status: DC | PRN
  Administered 2014-07-06 – 2014-07-07 (×2): 1 via ORAL
  Filled 2014-07-05 (×2): qty 1
  Filled 2014-07-05: qty 2

## 2014-07-05 MED ORDER — ATORVASTATIN CALCIUM 40 MG PO TABS
40.0000 mg | ORAL_TABLET | Freq: Every day | ORAL | Status: DC
  Administered 2014-07-05 – 2014-07-07 (×3): 40 mg via ORAL
  Filled 2014-07-05 (×4): qty 1

## 2014-07-05 MED ORDER — SODIUM CHLORIDE 0.9 % IJ SOLN: 3.0000 mL | INTRAMUSCULAR | Status: DC | PRN

## 2014-07-05 MED ORDER — ACETAMINOPHEN 650 MG RE SUPP: 650.0000 mg | Freq: Four times a day (QID) | RECTAL | Status: DC | PRN

## 2014-07-05 MED ORDER — ACETAMINOPHEN 325 MG PO TABS: 650.0000 mg | ORAL_TABLET | Freq: Four times a day (QID) | ORAL | Status: DC | PRN

## 2014-07-05 MED ORDER — SODIUM CHLORIDE 0.9 % IV SOLN: 250.0000 mL | INTRAVENOUS | Status: DC | PRN

## 2014-07-05 MED ORDER — LOSARTAN POTASSIUM 50 MG PO TABS
100.0000 mg | ORAL_TABLET | Freq: Every day | ORAL | Status: DC
  Administered 2014-07-05 – 2014-07-08 (×4): 100 mg via ORAL
  Filled 2014-07-05 (×4): qty 2

## 2014-07-05 MED ORDER — LOSARTAN POTASSIUM-HCTZ 100-12.5 MG PO TABS: 1.0000 | ORAL_TABLET | Freq: Every day | ORAL | Status: DC

## 2014-07-05 MED ORDER — ASPIRIN EC 81 MG PO TBEC
81.0000 mg | DELAYED_RELEASE_TABLET | Freq: Every day | ORAL | Status: DC
  Administered 2014-07-05 – 2014-07-08 (×4): 81 mg via ORAL
  Filled 2014-07-05 (×4): qty 1

## 2014-07-05 MED ORDER — SODIUM CHLORIDE 0.9 % IJ SOLN
3.0000 mL | Freq: Two times a day (BID) | INTRAMUSCULAR | Status: DC
  Administered 2014-07-07 (×2): 3 mL via INTRAVENOUS

## 2014-07-05 MED ORDER — POTASSIUM CHLORIDE 20 MEQ/15ML (10%) PO SOLN
40.0000 meq | Freq: Once | ORAL | Status: AC
  Administered 2014-07-05: 40 meq via ORAL
  Filled 2014-07-05: qty 30

## 2014-07-05 MED ORDER — DONEPEZIL HCL 10 MG PO TABS
10.0000 mg | ORAL_TABLET | Freq: Every day | ORAL | Status: DC
  Administered 2014-07-05 – 2014-07-07 (×3): 10 mg via ORAL
  Filled 2014-07-05 (×4): qty 1

## 2014-07-05 MED ORDER — SODIUM CHLORIDE 0.9 % IV SOLN
INTRAVENOUS | Status: DC
  Administered 2014-07-05: 09:00:00 via INTRAVENOUS
  Administered 2014-07-06: 50 mL via INTRAVENOUS
  Administered 2014-07-07: 02:00:00 via INTRAVENOUS

## 2014-07-05 MED ORDER — ONDANSETRON HCL 4 MG/2ML IJ SOLN: 4.0000 mg | Freq: Four times a day (QID) | INTRAMUSCULAR | Status: DC | PRN

## 2014-07-05 MED ORDER — INSULIN ASPART 100 UNIT/ML ~~LOC~~ SOLN
0.0000 [IU] | Freq: Three times a day (TID) | SUBCUTANEOUS | Status: DC
  Administered 2014-07-05 (×2): 2 [IU] via SUBCUTANEOUS
  Administered 2014-07-05 – 2014-07-06 (×2): 3 [IU] via SUBCUTANEOUS
  Administered 2014-07-06 – 2014-07-07 (×2): 2 [IU] via SUBCUTANEOUS
  Administered 2014-07-07: 3 [IU] via SUBCUTANEOUS
  Administered 2014-07-07: 5 [IU] via SUBCUTANEOUS

## 2014-07-05 MED ORDER — DOCUSATE SODIUM 100 MG PO CAPS
100.0000 mg | ORAL_CAPSULE | Freq: Two times a day (BID) | ORAL | Status: DC
  Administered 2014-07-05 – 2014-07-08 (×7): 100 mg via ORAL

## 2014-07-05 MED ORDER — ENOXAPARIN SODIUM 30 MG/0.3ML ~~LOC~~ SOLN: 30.0000 mg | SUBCUTANEOUS | Status: DC

## 2014-07-05 MED ORDER — ONDANSETRON HCL 4 MG PO TABS: 4.0000 mg | ORAL_TABLET | Freq: Four times a day (QID) | ORAL | Status: DC | PRN

## 2014-07-05 MED ORDER — HYDROCHLOROTHIAZIDE 12.5 MG PO CAPS
12.5000 mg | ORAL_CAPSULE | Freq: Every day | ORAL | Status: DC
  Administered 2014-07-05 – 2014-07-08 (×4): 12.5 mg via ORAL
  Filled 2014-07-05 (×4): qty 1

## 2014-07-05 NOTE — Progress Notes (Signed)
Clinical Social Work Department BRIEF PSYCHOSOCIAL ASSESSMENT 07/05/2014  Patient:  Kathy Thomas, Kathy Thomas     Account Number:  [id number]     [place] date:  07/04/2014  Clinical Social Worker:  Lacie Scotts  Date/Time:  07/05/2014 02:34 PM  Referred by:  Physician  Date Referred:  07/05/2014 Referred for  SNF Placement   Other Referral:   Interview type:  Patient Other interview type:    PSYCHOSOCIAL DATA Living Status:  FAMILY Admitted from facility:   Level of care:   Primary support name:  Lenise Herald Primary support relationship to patient:  CHILD, ADULT Degree of support available:   supportive    CURRENT CONCERNS Current Concerns  Post-Acute Placement   Other Concerns:    SOCIAL WORK ASSESSMENT / PLAN Pt is a 80 yr old female living at home prior to hospitalization. CSW met with pt's granddaughter to assist with d/c planning. Pt was out of room having a procedure. Family reports pt lives with daughter but is alone during the day. Other family members check in on her. Family would like pt to have ST Rehab prior to returning home. SNF search has been initiated and bed offers are pending. Pt has [employer] which requires prior authorization. CSW will assist with this process.   Assessment/plan status:  Psychosocial Support/Ongoing Assessment of Needs Other assessment/ plan:   Information/referral to community resources:   Insurance coverage for SNF and ambulance transport reviewed.    PATIENT'S/FAMILY'S RESPONSE TO PLAN OF CARE: " It's been a difficult few days with my grandmother at home. " Granddaughter is hopeful that pt can go to rehab prior to d/c. " I think we can take better care of my granddaughter once she recovers from this fall."    Werner Lean LCSW [phone]

## 2014-07-05 NOTE — Consult Note (Signed)
Reason for Consult:   Left ankle and knee pain Referring Physician:   ED Physician  Kathy Thomas is an 80 y.o. female.  HPI:   Kathy Thomas presented to the ED after fall. Fall occurred 3 days ago. Patient suffered a mechanical fall at home and had immediate pain and swelling of the left ankle. She was evaluated by an orthopedist and placed in a cam walker boot. At that time, she only had her left ankle evaluated. Yesterday she developed pain and swelling in the left knee as well. The patient has a past medical history of dementia, diabetes, low back pain, obesity. She lives with her daughter. The daughter states that she has frequent falls at home. Since her fall on Sunday. She has been unable to ambulate without significant help. She does use a walker but still requires a 2 people to help her. The daughter states she is extremely unsteady and she fears for her safety. The patient complains of left ankle and left knee pain.  The patient's fall was witnessed by family.   Past Medical History  Diagnosis Date  . Acute sinusitis, unspecified 05/28/2008  . ANEMIA-IRON DEFICIENCY 10/26/2008  . ANXIETY 10/06/2007  . CHOLELITHIASIS 10/06/2007  . COLONIC POLYPS, HX OF 01/26/2007  . DIABETES MELLITUS, TYPE II 10/06/2007  . DIVERTICULOSIS, COLON 01/26/2007  . ECCHYMOSES, SPONTANEOUS 06/04/2010  . GERD 01/26/2007  . HERNIATED DISC 01/26/2007  . HYPERLIPIDEMIA 10/06/2007  . HYPERTENSION 01/26/2007  . LOW BACK PAIN 01/29/2007  . MENOPAUSAL DISORDER 10/26/2008  . OSTEOPOROSIS 06/04/2010  . SHOULDER PAIN, LEFT 10/06/2007    Past Surgical History  Procedure Laterality Date  . Appendectomy    . Abdominal hysterectomy    . Tonsillectomy    . Left elbow-nerve impingement  1985  . Left ankle-screw placement  1980  . Herniated disk   1995    C-spine  . Fracture left  wrist  2010    Dr. Amedeo Plenty    Family History  Problem Relation Age of Onset  . Heart disease Father   . Hyperlipidemia Other   . Stroke Other   .  Hypertension Other     Social History:  reports that she quit smoking 6 days ago. Her smoking use included Cigarettes. She smoked 0.00 packs per day. She does not have any smokeless tobacco history on file. She reports that she does not drink alcohol or use illicit drugs.  Allergies:  Allergies  Allergen Reactions  . Codeine Hives  . Morphine     REACTION: pt unsure of reaction  . Penicillins     REACTION: rash     Results for orders placed or performed during the hospital encounter of 07/04/14 (from the past 48 hour(s))  Basic metabolic panel     Status: Abnormal   Collection Time: 07/04/14  8:50 PM  Result Value Ref Range   Sodium 142 135 - 145 mmol/L   Potassium 3.9 3.5 - 5.1 mmol/L   Chloride 102 96 - 112 mmol/L   CO2 27 19 - 32 mmol/L   Glucose, Bld 165 (H) 70 - 99 mg/dL   BUN 26 (H) 6 - 23 mg/dL   Creatinine, Ser 0.95 0.50 - 1.10 mg/dL   Calcium 8.7 8.4 - 10.5 mg/dL   GFR calc non Af Amer 56 (L) >90 mL/min   GFR calc Af Amer 65 (L) >90 mL/min    Comment: (NOTE) The eGFR has been calculated using the CKD EPI equation. This calculation has not  been validated in all clinical situations. eGFR's persistently <90 mL/min signify possible Chronic Kidney Disease.    Anion gap 13 5 - 15  CBC with Differential     Status: Abnormal   Collection Time: 07/04/14  8:50 PM  Result Value Ref Range   WBC 9.7 4.0 - 10.5 K/uL   RBC 4.41 3.87 - 5.11 MIL/uL   Hemoglobin 12.1 12.0 - 15.0 g/dL   HCT 39.2 36.0 - 46.0 %   MCV 88.9 78.0 - 100.0 fL   MCH 27.4 26.0 - 34.0 pg   MCHC 30.9 30.0 - 36.0 g/dL   RDW 13.3 11.5 - 15.5 %   Platelets 240 150 - 400 K/uL   Neutrophils Relative % 68 43 - 77 %   Neutro Abs 6.5 1.7 - 7.7 K/uL   Lymphocytes Relative 17 12 - 46 %   Lymphs Abs 1.6 0.7 - 4.0 K/uL   Monocytes Relative 13 (H) 3 - 12 %   Monocytes Absolute 1.3 (H) 0.1 - 1.0 K/uL   Eosinophils Relative 2 0 - 5 %   Eosinophils Absolute 0.2 0.0 - 0.7 K/uL   Basophils Relative 0 0 - 1 %    Basophils Absolute 0.0 0.0 - 0.1 K/uL  Urinalysis, Routine w reflex microscopic     Status: Abnormal   Collection Time: 07/04/14  9:24 PM  Result Value Ref Range   Color, Urine YELLOW YELLOW   APPearance CLOUDY (A) CLEAR   Specific Gravity, Urine 1.026 1.005 - 1.030   pH 5.0 5.0 - 8.0   Glucose, UA 250 (A) NEGATIVE mg/dL   Hgb urine dipstick NEGATIVE NEGATIVE   Bilirubin Urine SMALL (A) NEGATIVE   Ketones, ur NEGATIVE NEGATIVE mg/dL   Protein, ur NEGATIVE NEGATIVE mg/dL   Urobilinogen, UA 1.0 0.0 - 1.0 mg/dL   Nitrite NEGATIVE NEGATIVE   Leukocytes, UA SMALL (A) NEGATIVE  Urine microscopic-add on     Status: Abnormal   Collection Time: 07/04/14  9:24 PM  Result Value Ref Range   Squamous Epithelial / LPF FEW (A) RARE   WBC, UA 0-2 <3 WBC/hpf   RBC / HPF 0-2 <3 RBC/hpf   Bacteria, UA FEW (A) RARE   Urine-Other LESS THAN 10 mL OF URINE SUBMITTED   I-stat chem 8, ed     Status: Abnormal   Collection Time: 07/05/14  1:55 AM  Result Value Ref Range   Sodium 140 135 - 145 mmol/L   Potassium 3.9 3.5 - 5.1 mmol/L   Chloride 99 96 - 112 mmol/L   BUN 27 (H) 6 - 23 mg/dL   Creatinine, Ser 0.80 0.50 - 1.10 mg/dL   Glucose, Bld 131 (H) 70 - 99 mg/dL   Calcium, Ion 1.06 (L) 1.13 - 1.30 mmol/L   TCO2 27 0 - 100 mmol/L   Hemoglobin 13.6 12.0 - 15.0 g/dL   HCT 40.0 36.0 - 46.0 %  Glucose, capillary     Status: Abnormal   Collection Time: 07/05/14  3:00 AM  Result Value Ref Range   Glucose-Capillary 145 (H) 70 - 99 mg/dL  Magnesium     Status: None   Collection Time: 07/05/14  5:25 AM  Result Value Ref Range   Magnesium 1.7 1.5 - 2.5 mg/dL  Phosphorus     Status: None   Collection Time: 07/05/14  5:25 AM  Result Value Ref Range   Phosphorus 3.4 2.3 - 4.6 mg/dL  Comprehensive metabolic panel     Status: Abnormal   Collection  Time: 07/05/14  5:25 AM  Result Value Ref Range   Sodium 140 135 - 145 mmol/L   Potassium 3.6 3.5 - 5.1 mmol/L   Chloride 101 96 - 112 mmol/L   CO2 30 19 - 32  mmol/L   Glucose, Bld 138 (H) 70 - 99 mg/dL   BUN 24 (H) 6 - 23 mg/dL   Creatinine, Ser 0.91 0.50 - 1.10 mg/dL   Calcium 8.3 (L) 8.4 - 10.5 mg/dL   Total Protein 6.5 6.0 - 8.3 g/dL   Albumin 3.3 (L) 3.5 - 5.2 g/dL   AST 15 0 - 37 U/L   ALT 11 0 - 35 U/L   Alkaline Phosphatase 65 39 - 117 U/L   Total Bilirubin 0.7 0.3 - 1.2 mg/dL   GFR calc non Af Amer 59 (L) >90 mL/min   GFR calc Af Amer 69 (L) >90 mL/min    Comment: (NOTE) The eGFR has been calculated using the CKD EPI equation. This calculation has not been validated in all clinical situations. eGFR's persistently <90 mL/min signify possible Chronic Kidney Disease.    Anion gap 9 5 - 15  CBC     Status: Abnormal   Collection Time: 07/05/14  5:25 AM  Result Value Ref Range   WBC 8.6 4.0 - 10.5 K/uL   RBC 3.94 3.87 - 5.11 MIL/uL   Hemoglobin 10.8 (L) 12.0 - 15.0 g/dL    Comment: DELTA CHECK NOTED REPEATED TO VERIFY    HCT 34.9 (L) 36.0 - 46.0 %   MCV 88.6 78.0 - 100.0 fL   MCH 27.4 26.0 - 34.0 pg   MCHC 30.9 30.0 - 36.0 g/dL   RDW 13.3 11.5 - 15.5 %   Platelets 253 150 - 400 K/uL    Dg Ankle Complete Left  07/04/2014   CLINICAL DATA:  Patient fell on Sunday and went to [facility] on Monday. Diagnosed as a sprain. Swelling and bruising have increased since then. Increased pain now radiating up to the left knee.  EXAM: LEFT ANKLE COMPLETE - 3+ VIEW  COMPARISON:  None.  FINDINGS: Old postoperative changes with screw and wire fixation of the distal fibula. Diffuse bone demineralization. Degenerative changes in the left ankle and intertarsal joints. Plantar and Achilles spurs of the calcaneus. Mild diffuse soft tissue swelling about the left ankle. No acute fracture or dislocation is appreciated. Vascular calcifications.  IMPRESSION: Diffuse degenerative change and demineralization in the left ankle. Soft tissue swelling. No acute fractures identified.   Electronically Signed   By: Lucienne Capers M.D.   On: 07/04/2014  20:30   Ct Tibia Fibula Left Wo Contrast  07/04/2014   CLINICAL DATA:  Nondisplaced fractures of the proximal fibula demonstrated on plain film radiographs. History of a fall.  EXAM: CT TIBIA FIBULA LEFT WITHOUT CONTRAST  TECHNIQUE: Multidetector CT imaging was performed according to the standard protocol. Multiplanar CT image reconstructions were also generated.  COMPARISON:  Left knee 07/04/2014  FINDINGS: Acute nondisplaced fracture of the proximal left fibular metaphysis. No fractures demonstrated in the distal femur or of the tibia. Postoperative changes demonstrated in the distal fibula with screw and wire fixation. Degenerative changes in the knee with narrowed medial and lateral compartments and associated hypertrophic changes. Diffuse bone demineralization suggest osteoporosis. Vascular calcifications. No significant effusion at the knee.  IMPRESSION: Acute posttraumatic nondisplaced fracture of the proximal left fibular metaphysis. No additional fractures identified. Diffuse bone demineralization suggest underlying osteoporosis.   Electronically Signed   By: Gwyndolyn Saxon  Gerilyn Nestle M.D.   On: 07/04/2014 22:54   Dg Knee Complete 4 Views Left  07/04/2014   CLINICAL DATA:  Fall on Sunday.  Ankle pain on bruising and swelling  EXAM: LEFT KNEE - COMPLETE 4+ VIEW  COMPARISON:  None.  FINDINGS: No fracture of the proximal tibia or distal femur. Patella is normal. No joint effusion. There is a subtle lucency at the metadiaphysis of the proximal fibula concerning for nondisplaced fracture.  IMPRESSION: Probable nondisplaced fracture of the proximal fibula. No evidence of fracture at the knee joint.   Electronically Signed   By: Suzy Bouchard M.D.   On: 07/04/2014 20:28    Review of Systems  Constitutional: Negative.   HENT: Negative.   Eyes: Negative.   Respiratory: Negative.   Cardiovascular: Negative.   Gastrointestinal: Positive for heartburn.  Genitourinary: Negative.   Musculoskeletal: Positive  for back pain, joint pain and falls.  Skin: Negative.   Neurological: Negative.   Endo/Heme/Allergies: Negative.   Psychiatric/Behavioral: Positive for memory loss. The patient is nervous/anxious.    Blood pressure 124/58, pulse 75, temperature 99.4 F (37.4 C), temperature source Oral, resp. rate 18, height 5' (1.524 m), weight 89.4 kg (197 lb 1.5 oz), SpO2 95 %. Physical Exam  Constitutional: She appears well-developed and well-nourished.  HENT:  Head: Normocephalic and atraumatic.  Eyes: Pupils are equal, round, and reactive to light.  Neck: Neck supple. No JVD present. No tracheal deviation present. No thyromegaly present.  Cardiovascular: Normal rate, regular rhythm, normal heart sounds and intact distal pulses.   Respiratory: Effort normal and breath sounds normal. No respiratory distress. She has no wheezes.  GI: Soft. There is no tenderness. There is no guarding.  Musculoskeletal:       Left knee: She exhibits bony tenderness. She exhibits no swelling, no effusion, no ecchymosis, no deformity, no laceration and no erythema. Tenderness (very mild on palpation) found. Lateral joint line tenderness noted. No medial joint line tenderness noted.       Left ankle: She exhibits normal range of motion, no swelling, no deformity, no laceration and normal pulse. Tenderness (very mild and at times none).  Lymphadenopathy:    She has no cervical adenopathy.  Neurological: She is alert. She is disoriented.  Skin: Skin is warm and dry.  Psychiatric: She has a normal mood and affect.    Assessment/Plan: Acute posttraumatic nondisplaced fracture of the proximal left fibula   Does not need to use the cam walker Use of knee immobilizer on the left knee for comfort WBAT on the left leg with walker for comfort and safety     Pricilla Loveless 07/05/2014, 7:42 AM

## 2014-07-05 NOTE — Evaluation (Signed)
Physical Therapy Evaluation Patient Details Name: Kathy Thomas MRN: [id number] DOB: 09/09/1934 Today's Date: 07/05/2014   History of Present Illness  This 80 year old female fell and presented to ED with L ankle pain.  Sent home with cam boot.  Returned 3 days later with L knee pain and swelling. She was found to have a nondisplaced proximal fibula fx on the L.    Clinical Impression  Pt currently presenting with functional mobility limitations 2* decreased L LE strength/ROM, pain increased with WB, ambulatory balance deficits and pre-existing dementia.  Pt would benefit from follow up rehab at SNF level to maximize safety and IND prior to return home with ltd assist.    Follow Up Recommendations SNF    Equipment Recommendations  None recommended by PT    Recommendations for Other Services OT consult     Precautions / Restrictions Precautions Precautions: Fall Required Braces or Orthoses: Knee Immobilizer - Left Knee Immobilizer - Left:  (FOR COMFORT ONLY) Restrictions Weight Bearing Restrictions: No Other Position/Activity Restrictions: WBAT      Mobility  Bed Mobility Overal bed mobility: Needs Assistance Bed Mobility: Supine to Sit     Supine to sit: Min assist;Mod assist     General bed mobility comments: light assistance for trunk and assistance for LLE  Transfers Overall transfer level: Needs assistance Equipment used: Rolling walker (2 wheeled) Transfers: Sit to/from Stand Sit to Stand: Min assist;Mod assist         General transfer comment: assistance to rise and steady  Ambulation/Gait Ambulation/Gait assistance: Min assist Ambulation Distance (Feet): 56 Feet Assistive device: Rolling walker (2 wheeled) Gait Pattern/deviations: Step-to pattern;Decreased step length - right;Decreased step length - left;Shuffle;Trunk flexed;Antalgic Gait velocity: decr   General Gait Details: cues for posture, position from RW and sequence  Stairs             Wheelchair Mobility    Modified Rankin (Stroke Patients Only)       Balance Overall balance assessment: Needs assistance Sitting-balance support: Feet supported Sitting balance-Leahy Scale: Good     Standing balance support: Bilateral upper extremity supported Standing balance-Leahy Scale: Poor                               Pertinent Vitals/Pain Pain Assessment: 0-10 Pain Score: 5  Pain Location: L ankle with WB Pain Descriptors / Indicators: Aching;Sore Pain Intervention(s): Limited activity within patient's tolerance;Monitored during session    Home Living Family/patient expects to be discharged to:: Skilled nursing facility Living Arrangements: Alone               Additional Comments: pt was living alone before she fell.  Then went to daughter's house.  Does not have 24/7.  Using walker, but using it like cart,leaning over.  Family assisted her in shower, standing and she performed adls.  Has RW, standard commode, stands in shower stall    Prior Function Level of Independence: Needs assistance   Gait / Transfers Assistance Needed: RW, cane     Comments: pt was mostly mod I, using cane.  Family assisted with showers     Hand Dominance        Extremity/Trunk Assessment   Upper Extremity Assessment: Overall WFL for tasks assessed           Lower Extremity Assessment: LLE deficits/detail   LLE Deficits / Details: ankle df wfl, knee flex pain ltd to ~ 30; 2+/5  quads   Cervical / Trunk Assessment: Normal  Communication   Communication: No difficulties  Cognition Arousal/Alertness: Awake/alert Behavior During Therapy: WFL for tasks assessed/performed Overall Cognitive Status: History of cognitive impairments - at baseline                      General Comments      Exercises General Exercises - Lower Extremity Ankle Circles/Pumps: AROM;Both;15 reps      Assessment/Plan    PT Assessment Patient needs continued PT  services  PT Diagnosis Difficulty walking   PT Problem List Decreased strength;Decreased range of motion;Decreased activity tolerance;Decreased balance;Decreased mobility;Decreased knowledge of use of DME;Pain  PT Treatment Interventions DME instruction;Gait training;Stair training;Functional mobility training;Therapeutic activities;Therapeutic exercise;Patient/family education   PT Goals (Current goals can be found in the Care Plan section) Acute Rehab PT Goals Patient Stated Goal: family: better balance, increased safety PT Goal Formulation: With patient/family Time For Goal Achievement: 07/19/14 Potential to Achieve Goals: Good    Frequency Min 3X/week   Barriers to discharge Decreased caregiver support home alone    Co-evaluation PT/OT/SLP Co-Evaluation/Treatment: Yes Reason for Co-Treatment: For patient/therapist safety PT goals addressed during session: Mobility/safety with mobility OT goals addressed during session: ADL's and self-care       End of Session Equipment Utilized During Treatment: Gait belt Activity Tolerance: Patient tolerated treatment well;Patient limited by pain Patient left: in chair;with call bell/phone within reach;with family/visitor present Nurse Communication: Mobility status         Time: 1610-9604 PT Time Calculation (min) (ACUTE ONLY): 34 min   Charges:   PT Evaluation $Initial PT Evaluation Tier I: 1 Procedure     PT G Codes:        Audrea Bolte 07/06/14, 3:34 PM

## 2014-07-05 NOTE — H&P (Addendum)
PCP:  Oliver Barre, MD  Orthopedics: [facility] orthopedics  Chief Complaint:  Left knee pain  HPI: Kathy Thomas is a 80 y.o. female   has a past medical history of Acute sinusitis, unspecified (05/28/2008); ANEMIA-IRON DEFICIENCY (10/26/2008); ANXIETY (10/06/2007); CHOLELITHIASIS (10/06/2007); COLONIC POLYPS, HX OF (01/26/2007); DIABETES MELLITUS, TYPE II (10/06/2007); DIVERTICULOSIS, COLON (01/26/2007); ECCHYMOSES, SPONTANEOUS (06/04/2010); GERD (01/26/2007); HERNIATED DISC (01/26/2007); HYPERLIPIDEMIA (10/06/2007); HYPERTENSION (01/26/2007); LOW BACK PAIN (01/29/2007); MENOPAUSAL DISORDER (10/26/2008); OSTEOPOROSIS (06/04/2010); and SHOULDER PAIN, LEFT (10/06/2007).   Presented with  She has been falling a lot lately the last fall was on Sunday initially she had a lot of pain in her left ankle she presented to orthopedics and ankle was evaluated. In the next few days the left knee was swollen and she continued to have pain. Family presented to [facility] ER and imaging of the knee showed left fibular fracture.   Hospitalist was called for admission for debility and left fibular fracture and frequent falls.   Review of Systems:    Pertinent positives include:   Confusion, frequent falls, frequent urination  Constitutional:  No weight loss, night sweats, Fevers, chills, fatigue, weight loss  HEENT:  No headaches, Difficulty swallowing,Tooth/dental problems,Sore throat,  No sneezing, itching, ear ache, nasal congestion, post nasal drip,  Cardio-vascular:  No chest pain, Orthopnea, PND, anasarca, dizziness, palpitations.no Bilateral lower extremity swelling  GI:  No heartburn, indigestion, abdominal pain, nausea, vomiting, diarrhea, change in bowel habits, loss of appetite, melena, blood in stool, hematemesis Resp:  no shortness of breath at rest. No dyspnea on exertion, No excess mucus, no productive cough, No non-productive cough, No coughing up of blood.No change in color of mucus.No wheezing. Skin:  no rash or  lesions. No jaundice GU:  no dysuria, change in color of urine, no urgency No straining to urinate.  No flank pain.  Musculoskeletal:  No joint pain or no joint swelling. No decreased range of motion. No back pain.  Psych:  No change in mood or affect. No depression or anxiety. No memory loss.  Neuro: no localizing neurological complaints, no tingling, no weakness, no double vision, no gait abnormality, no slurred speech, no  Otherwise ROS are negative except for above, 10 systems were reviewed  Past Medical History: Past Medical History  Diagnosis Date  . Acute sinusitis, unspecified 05/28/2008  . ANEMIA-IRON DEFICIENCY 10/26/2008  . ANXIETY 10/06/2007  . CHOLELITHIASIS 10/06/2007  . COLONIC POLYPS, HX OF 01/26/2007  . DIABETES MELLITUS, TYPE II 10/06/2007  . DIVERTICULOSIS, COLON 01/26/2007  . ECCHYMOSES, SPONTANEOUS 06/04/2010  . GERD 01/26/2007  . HERNIATED DISC 01/26/2007  . HYPERLIPIDEMIA 10/06/2007  . HYPERTENSION 01/26/2007  . LOW BACK PAIN 01/29/2007  . MENOPAUSAL DISORDER 10/26/2008  . OSTEOPOROSIS 06/04/2010  . SHOULDER PAIN, LEFT 10/06/2007   Past Surgical History  Procedure Laterality Date  . Appendectomy    . Abdominal hysterectomy    . Tonsillectomy    . Left elbow-nerve impingement  1985  . Left ankle-screw placement  1980  . Herniated disk   1995    C-spine  . Fracture left  wrist  2010    Dr. Amanda Pea     Medications: Prior to Admission medications   Medication Sig Start Date End Date Taking? Authorizing Provider  aspirin EC 81 MG tablet Take 1 tablet (81 mg total) by mouth daily. 09/08/13  Yes [name], MD  atorvastatin (LIPITOR) 40 MG tablet TAKE 1 TABLET BY MOUTH ONCE A DAY 11/17/13  Yes [name], MD  Calcium 1200-1000 MG-UNIT CHEW Chew by mouth daily.     Yes Historical Provider, MD  donepezil (ARICEPT) 10 MG tablet Take 1 tablet (10 mg total) by mouth at bedtime. 10/11/13  Yes Corwin LevinsJames W John, MD  losartan-hydrochlorothiazide [facility](HYZAAR) 100-12.5 MG per tablet TAKE 1  TABLET BY MOUTH ONCE A DAY 12/05/13  Yes Corwin LevinsJames W John, MD  metFORMIN (GLUCOPHAGE-XR) 500 MG 24 hr tablet TAKE 2 TABLETS BY MOUTH ONCE DAILY WITH BREAKFAST 07/13/13  Yes Corwin LevinsJames W John, MD  HYDROcodone-acetaminophen (NORCO/VICODIN) 5-325 MG per tablet Take 1 tablet by mouth every 6 (six) hours as needed for moderate pain. Patient not taking: Reported on 07/04/2014 07/04/14   Corwin LevinsJames W John, MD    Allergies:   Allergies  Allergen Reactions  . Codeine Hives  . Morphine     REACTION: pt unsure of reaction  . Penicillins     REACTION: rash    Social History:  Ambulatory walker   Lives at home With family but the family has been traveling.      reports that she quit smoking 6 days ago. Her smoking use included Cigarettes. She smoked 0.00 packs per day. She does not have any smokeless tobacco history on file. She reports that she does not drink alcohol or use illicit drugs.    Family History: family history includes Heart disease in her father; Hyperlipidemia in her other; Hypertension in her other; Stroke in her other.    Physical Exam: Patient Vitals for the past 24 hrs:  BP Temp Temp src Pulse Resp SpO2  07/04/14 2307 147/62 mmHg - - 67 18 94 %  07/04/14 1926 143/63 mmHg 98.1 F (36.7 C) Oral 81 20 97 %    1. General:  in No Acute distress 2. Psychological: Alert but not Oriented 3. Head/ENT:   Dry Mucous Membranes                          Head Non traumatic, neck supple                          Normal   Dentition 4. SKIN:   decreased Skin turgor,  Skin clean Dry and intact no rash 5. Heart: Regular rate and rhythm no Murmur, Rub or gallop 6. Lungs: Clear to auscultation bilaterally, no wheezes or crackles   7. Abdomen: Soft, non-tender, Non distended 8. Lower extremities: no clubbing, cyanosis, or edema 9. Neurologically Grossly intact, moving all 4 extremities equally 10. MSK: swelling of left ankle and knee with some bruising  body mass index is unknown because there is no  weight on file.   Labs on Admission:   Results for orders placed or performed during the hospital encounter of 07/04/14 (from the past 24 hour(s))  CBC with Differential     Status: Abnormal   Collection Time: 07/04/14  8:50 PM  Result Value Ref Range   WBC 9.7 4.0 - 10.5 K/uL   RBC 4.41 3.87 - 5.11 MIL/uL   Hemoglobin 12.1 12.0 - 15.0 g/dL   HCT 16.139.2 09.636.0 - 04.546.0 %   MCV 88.9 78.0 - 100.0 fL   MCH 27.4 26.0 - 34.0 pg   MCHC 30.9 30.0 - 36.0 g/dL   RDW 40.913.3 81.111.5 - 91.415.5 %   Platelets 240 150 - 400 K/uL   Neutrophils Relative % 68 43 - 77 %   Neutro Abs 6.5 1.7 - 7.7 K/uL  Lymphocytes Relative 17 12 - 46 %   Lymphs Abs 1.6 0.7 - 4.0 K/uL   Monocytes Relative 13 (H) 3 - 12 %   Monocytes Absolute 1.3 (H) 0.1 - 1.0 K/uL   Eosinophils Relative 2 0 - 5 %   Eosinophils Absolute 0.2 0.0 - 0.7 K/uL   Basophils Relative 0 0 - 1 %   Basophils Absolute 0.0 0.0 - 0.1 K/uL  Urinalysis, Routine w reflex microscopic     Status: Abnormal   Collection Time: 07/04/14  9:24 PM  Result Value Ref Range   Color, Urine YELLOW YELLOW   APPearance CLOUDY (A) CLEAR   Specific Gravity, Urine 1.026 1.005 - 1.030   pH 5.0 5.0 - 8.0   Glucose, UA 250 (A) NEGATIVE mg/dL   Hgb urine dipstick NEGATIVE NEGATIVE   Bilirubin Urine SMALL (A) NEGATIVE   Ketones, ur NEGATIVE NEGATIVE mg/dL   Protein, ur NEGATIVE NEGATIVE mg/dL   Urobilinogen, UA 1.0 0.0 - 1.0 mg/dL   Nitrite NEGATIVE NEGATIVE   Leukocytes, UA SMALL (A) NEGATIVE  Urine microscopic-add on     Status: Abnormal   Collection Time: 07/04/14  9:24 PM  Result Value Ref Range   Squamous Epithelial / LPF FEW (A) RARE   WBC, UA 0-2 <3 WBC/hpf   RBC / HPF 0-2 <3 RBC/hpf   Bacteria, UA FEW (A) RARE   Urine-Other LESS THAN 10 mL OF URINE SUBMITTED     UA cloudy no evidence of UTI  Lab Results  Component Value Date   HGBA1C 6.6* 01/10/2014    CrCl cannot be calculated (Unknown ideal weight.).  BNP (last 3 results) No results for input(s):  PROBNP in the last 8760 hours.  Other results:  I have pearsonaly reviewed this: ECG not obtained   There were no vitals filed for this visit.   Cultures: No results found for: SDES, SPECREQUEST, CULT, REPTSTATUS   Radiological Exams on Admission: Dg Ankle Complete Left  07/04/2014   CLINICAL DATA:  Patient fell on Sunday and went to [facility] orthopedics on Monday. Diagnosed as a sprain. Swelling and bruising have increased since then. Increased pain now radiating up to the left knee.  EXAM: LEFT ANKLE COMPLETE - 3+ VIEW  COMPARISON:  None.  FINDINGS: Old postoperative changes with screw and wire fixation of the distal fibula. Diffuse bone demineralization. Degenerative changes in the left ankle and intertarsal joints. Plantar and Achilles spurs of the calcaneus. Mild diffuse soft tissue swelling about the left ankle. No acute fracture or dislocation is appreciated. Vascular calcifications.  IMPRESSION: Diffuse degenerative change and demineralization in the left ankle. Soft tissue swelling. No acute fractures identified.   Electronically Signed   By: Burman Nieves M.D.   On: 07/04/2014 20:30   Ct Tibia Fibula Left Wo Contrast  07/04/2014   CLINICAL DATA:  Nondisplaced fractures of the proximal fibula demonstrated on plain film radiographs. History of a fall.  EXAM: CT TIBIA FIBULA LEFT WITHOUT CONTRAST  TECHNIQUE: Multidetector CT imaging was performed according to the standard protocol. Multiplanar CT image reconstructions were also generated.  COMPARISON:  Left knee 07/04/2014  FINDINGS: Acute nondisplaced fracture of the proximal left fibular metaphysis. No fractures demonstrated in the distal femur or of the tibia. Postoperative changes demonstrated in the distal fibula with screw and wire fixation. Degenerative changes in the knee with narrowed medial and lateral compartments and associated hypertrophic changes. Diffuse bone demineralization suggest osteoporosis. Vascular  calcifications. No significant effusion at the knee.  IMPRESSION:  Acute posttraumatic nondisplaced fracture of the proximal left fibular metaphysis. No additional fractures identified. Diffuse bone demineralization suggest underlying osteoporosis.   Electronically Signed   By: Burman Nieves M.D.   On: 07/04/2014 22:54   Dg Knee Complete 4 Views Left  07/04/2014   CLINICAL DATA:  Fall on Sunday.  Ankle pain on bruising and swelling  EXAM: LEFT KNEE - COMPLETE 4+ VIEW  COMPARISON:  None.  FINDINGS: No fracture of the proximal tibia or distal femur. Patella is normal. No joint effusion. There is a subtle lucency at the metadiaphysis of the proximal fibula concerning for nondisplaced fracture.  IMPRESSION: Probable nondisplaced fracture of the proximal fibula. No evidence of fracture at the knee joint.   Electronically Signed   By: Genevive Bi M.D.   On: 07/04/2014 20:28    Chart has been reviewed  Assessment/Plan  80 yo F with hx of dementia, hypertension and diabetes who presents from home with frequent falls last on the resulting in left fibula fracture  Present on Admission:  . Closed fibular fracture - orthopedics has been consult and awaiting their recommendations. For now nonweightbearing. Likely non-operative. Given frequent falls have PT OT evaluation family amendable to placement  Orthopedics consult . Dementia - stable will watch out for sundowning  . Essential hypertension - continue home medications  . diabetes mellitus type 2  - ordered sliding scale hold metformin     Prophylaxis:  Lovenox, Protonix  CODE STATUS:  FULL CODE   Other plan as per orders.  I have spent a total of 55 min on this admission  Lucielle Vokes 07/05/2014, 12:47 AM  [facility]  Pager [phone]   after 2 AM please page floor coverage PA If 7AM-7PM, please contact the day team taking care of the patient  [email or web address]  Password [email or web address]

## 2014-07-05 NOTE — Evaluation (Signed)
Occupational Therapy Evaluation Patient Details Name: Kathy Thomas MRN: [id number] DOB: 02-21-1935 Today's Date: 07/05/2014    History of Present Illness This 80 year old female fell and presented to ED with L ankle pain.  Sent home with cam boot.  Returned 3 days later with L knee pain and swelling. She was found to have a nondisplaced proximal fibula fx on the L.     Clinical Impression   Pt was admitted for the above.  Prior to fall, she lived alone and had family come in to help with showering and check on her.  Since fall, she moved into daughter's home, but was alone a lot during the day.  Baseline, is mod I for ADLs except for showering.  Pt will benefit from skilled OT in acute and follow up at SNF. Goals in acute are for supervision to min A level.  She currently needs  min to mod A for ADLs    Follow Up Recommendations  SNF    Equipment Recommendations  3 in 1 bedside comode    Recommendations for Other Services       Precautions / Restrictions Precautions Precautions: Fall Restrictions Weight Bearing Restrictions: No      Mobility Bed Mobility Overal bed mobility: Needs Assistance Bed Mobility: Supine to Sit     Supine to sit: Min assist;Mod assist     General bed mobility comments: light assistance for trunk and assistance for LLE  Transfers Overall transfer level: Needs assistance Equipment used: Rolling walker (2 wheeled) Transfers: Sit to/from Stand Sit to Stand: Min assist;Mod assist         General transfer comment: assistance to rise and steady    Balance                                            ADL Overall ADL's : Needs assistance/impaired             Lower Body Bathing: Minimal assistance;Sit to/from stand (min/mod A to stand)       Lower Body Dressing: Moderate assistance;Sit to/from stand   Toilet Transfer: Minimal assistance;Ambulation (to recliner, min/mod A to stand)   Toileting- Clothing  Manipulation and Hygiene: Minimal assistance;Sit to/from stand (min/mod A to stand)         General ADL Comments: pt is able to perform UB adls with set up.  Pt needed very light mod A to stand and for bed mobility     Vision     Perception     Praxis      Pertinent Vitals/Pain Pain Assessment: 0-10 Pain Score: 5  Pain Location: L ankle Pain Descriptors / Indicators: Sore (with weightbearing) Pain Intervention(s): Limited activity within patient's tolerance;Monitored during session;Repositioned     Hand Dominance     Extremity/Trunk Assessment Upper Extremity Assessment Upper Extremity Assessment: Overall WFL for tasks assessed           Communication Communication Communication: No difficulties   Cognition Arousal/Alertness: Awake/alert Behavior During Therapy: WFL for tasks assessed/performed Overall Cognitive Status: History of cognitive impairments - at baseline (wfls during session)                     General Comments       Exercises       Shoulder Instructions      Home Living Family/patient expects to be discharged  to:: Skilled nursing facility                                 Additional Comments: pt was living alone before she fell.  Then went to daughter's house.  Does not have 24/7.  Using walker, but using it like cart,leaning over.  Family assisted her in shower, standing and she performed adls.  Has RW, standard commode, stands in shower stall      Prior Functioning/Environment Level of Independence: Needs assistance        Comments: pt was mostly mod I, using cane.  Family assisted with showers    OT Diagnosis: Acute pain;Generalized weakness   OT Problem List: Decreased strength;Decreased activity tolerance;Impaired balance (sitting and/or standing);Decreased knowledge of use of DME or AE;Pain   OT Treatment/Interventions: Self-care/ADL training;DME and/or AE instruction;Patient/family education;Balance training     OT Goals(Current goals can be found in the care plan section) Acute Rehab OT Goals Patient Stated Goal: family: better balance, increased safety OT Goal Formulation: With patient/family Time For Goal Achievement: 07/12/14 Potential to Achieve Goals: Good ADL Goals Pt Will Perform Lower Body Bathing: sit to/from stand;with supervision;with adaptive equipment Pt Will Perform Lower Body Dressing: with min assist;sit to/from stand;with adaptive equipment Pt Will Transfer to Toilet: ambulating;bedside commode;with min guard assist Pt Will Perform Toileting - Clothing Manipulation and hygiene: with min guard assist;sit to/from stand  OT Frequency: Min 2X/week   Barriers to D/C:            Co-evaluation PT/OT/SLP Co-Evaluation/Treatment: Yes Reason for Co-Treatment: For patient/therapist safety PT goals addressed during session: Mobility/safety with mobility OT goals addressed during session: ADL's and self-care      End of Session    Activity Tolerance: Patient tolerated treatment well Patient left: in chair;with call bell/phone within reach;with family/visitor present   Time: 1610-9604 OT Time Calculation (min): 27 min Charges:  OT General Charges $OT Visit: 1 Procedure OT Evaluation $Initial OT Evaluation Tier I: 1 Procedure G-Codes:    Tayven Renteria 07/24/2014, 12:46 PM  Marica Otter, OTR/L [phone] July 24, 2014

## 2014-07-05 NOTE — Progress Notes (Signed)
I have seen and examined Kathy Thomas at bedside in the presence of her nurse and her granddaughter. Kathy Thomas is a  Pleasant 80 y.o. female with past medical history of Acute sinusitis, unspecified (05/28/2008); ANEMIA-IRON DEFICIENCY (10/26/2008); ANXIETY (10/06/2007); CHOLELITHIASIS (10/06/2007); COLONIC POLYPS, HX OF (01/26/2007); DIABETES MELLITUS, TYPE II (10/06/2007); DIVERTICULOSIS, COLON (01/26/2007); ECCHYMOSES, SPONTANEOUS (06/04/2010); GERD (01/26/2007); HERNIATED DISC (01/26/2007); HYPERLIPIDEMIA (10/06/2007); HYPERTENSION (01/26/2007); LOW BACK PAIN (01/29/2007); MENOPAUSAL DISORDER (10/26/2008); OSTEOPOROSIS (06/04/2010); and SHOULDER PAIN, LEFT (10/06/2007), who was admitted with an acute left fibular fracture after recurrent falls lately. CT of the leg showed "Acute posttraumatic nondisplaced fracture of the proximal left fibular metaphysis. No additional fractures identified. Diffuse bone demineralization suggest underlying osteoporosis". Question is why she has these falls lately- she seems quite coherent, therefore it may not be necessarily related to dementia alone. Will therefore obtain CT brain(MRI would have been better but patient is reportedly severely claustrophobic- had to get open MRI last year, but struggled to get it completed even then), vitamin B12 level/tsh/rpr/CXR/cardiac enzymes/repeat UA(low grade temp of 60F, UA cloudy with bacteruria). Await orthopedics consultation. Agree with PT/OT consult. Will continue rest of paln of care per Dr Adela Glimpseoutova, please refer to her comprehensive assessment and care plan.

## 2014-07-05 NOTE — Progress Notes (Signed)
Clinical Social Work Department CLINICAL SOCIAL WORK PLACEMENT NOTE 07/05/2014  Patient:  Kathy Thomas,Kathy Thomas  Account Number:  [id number] Admit date:  07/04/2014  Clinical Social Worker:  Cori RazorJAMIE Sherrica Niehaus, LCSW  Date/time:  07/05/2014 02:49 PM  Clinical Social Work is seeking post-discharge placement for this patient at the following level of care:   SKILLED NURSING   (*CSW will update this form in Epic as items are completed)   07/05/2014  Patient/family provided with [facility] Department of Clinical Social Work's list of facilities offering this level of care within the geographic area requested by the patient (or if unable, by the patient's family).  07/05/2014  Patient/family informed of their freedom to choose among providers that offer the needed level of care, that participate in Medicare, Medicaid or managed care program needed by the patient, have an available bed and are willing to accept the patient.    Patient/family informed of [facility]' ownership interest in [facility], as well as of the fact that they are under no obligation to receive care at this facility.  PASARR submitted to EDS on 07/05/2014 PASARR number received on 07/05/2014  FL2 transmitted to all facilities in geographic area requested by pt/family on  07/05/2014 FL2 transmitted to all facilities within larger geographic area on   Patient informed that his/her managed care company has contracts with or will negotiate with  certain facilities, including the following:     Patient/family informed of bed offers received:   Patient chooses bed at  Physician recommends and patient chooses bed at    Patient to be transferred to  on   Patient to be transferred to facility by  Patient and family notified of transfer on  Name of family member notified:    The following physician request were entered in Epic:   Additional Comments:  Cori RazorJamie [address]ilby Way LCSW [phone]

## 2014-07-06 DIAGNOSIS — I369 Nonrheumatic tricuspid valve disorder, unspecified: Secondary | ICD-10-CM

## 2014-07-06 DIAGNOSIS — N390 Urinary tract infection, site not specified: Secondary | ICD-10-CM | POA: Diagnosis present

## 2014-07-06 DIAGNOSIS — E538 Deficiency of other specified B group vitamins: Secondary | ICD-10-CM | POA: Diagnosis present

## 2014-07-06 DIAGNOSIS — N3000 Acute cystitis without hematuria: Secondary | ICD-10-CM

## 2014-07-06 DIAGNOSIS — S82402D Unspecified fracture of shaft of left fibula, subsequent encounter for closed fracture with routine healing: Secondary | ICD-10-CM

## 2014-07-06 LAB — GLUCOSE, CAPILLARY
Glucose-Capillary: 131 mg/dL — ABNORMAL HIGH (ref 70–99)
Glucose-Capillary: 152 mg/dL — ABNORMAL HIGH (ref 70–99)
Glucose-Capillary: 76 mg/dL (ref 70–99)
Glucose-Capillary: 122 mg/dL — ABNORMAL HIGH (ref 70–99)

## 2014-07-06 LAB — PHOSPHORUS: Phosphorus: 3.5 mg/dL (ref 2.3–4.6)

## 2014-07-06 LAB — URINE CULTURE
COLONY COUNT: NO GROWTH
CULTURE: NO GROWTH

## 2014-07-06 LAB — COMPREHENSIVE METABOLIC PANEL
ALBUMIN: 3.4 g/dL — AB (ref 3.5–5.2)
ALK PHOS: 68 U/L (ref 39–117)
ALT: 13 U/L (ref 0–35)
AST: 16 U/L (ref 0–37)
Anion gap: 8 (ref 5–15)
BUN: 17 mg/dL (ref 6–23)
CHLORIDE: 101 mmol/L (ref 96–112)
CO2: 28 mmol/L (ref 19–32)
Calcium: 8.6 mg/dL (ref 8.4–10.5)
Creatinine, Ser: 0.71 mg/dL (ref 0.50–1.10)
GFR calc Af Amer: >90 mL/min (ref >90)
GFR calc non Af Amer: 81 mL/min — ABNORMAL LOW (ref >90)
GLUCOSE: 152 mg/dL — AB (ref 70–99)
Potassium: 4.1 mmol/L (ref 3.5–5.1)
Sodium: 137 mmol/L (ref 135–145)
Total Bilirubin: 0.8 mg/dL (ref 0.3–1.2)
Total Protein: 7.1 g/dL (ref 6.0–8.3)

## 2014-07-06 LAB — CBC
HEMATOCRIT: 37.9 % (ref 36.0–46.0)
HEMOGLOBIN: 11.7 g/dL — AB (ref 12.0–15.0)
MCH: 27.3 pg (ref 26.0–34.0)
MCHC: 30.9 g/dL (ref 30.0–36.0)
MCV: 88.6 fL (ref 78.0–100.0)
Platelets: 265 10*3/uL (ref 150–400)
RBC: 4.28 MIL/uL (ref 3.87–5.11)
RDW: 13.4 % (ref 11.5–15.5)
WBC: 7.5 10*3/uL (ref 4.0–10.5)

## 2014-07-06 LAB — MAGNESIUM: Magnesium: 1.7 mg/dL (ref 1.5–2.5)

## 2014-07-06 LAB — VITAMIN B12: VITAMIN B 12: 352 pg/mL (ref 211–911)

## 2014-07-06 LAB — BRAIN NATRIURETIC PEPTIDE: B NATRIURETIC PEPTIDE 5: 44.5 pg/mL (ref 0.0–100.0)

## 2014-07-06 LAB — RPR: RPR: NONREACTIVE

## 2014-07-06 MED ORDER — CYANOCOBALAMIN 1000 MCG/ML IJ SOLN
1000.0000 ug | Freq: Once | INTRAMUSCULAR | Status: AC
  Administered 2014-07-06: 1000 ug via INTRAMUSCULAR
  Filled 2014-07-06: qty 1

## 2014-07-06 MED ORDER — LEVOFLOXACIN IN D5W 500 MG/100ML IV SOLN
500.0000 mg | Freq: Every day | INTRAVENOUS | Status: DC
  Filled 2014-07-06: qty 100

## 2014-07-06 MED ORDER — LEVOFLOXACIN IN D5W 750 MG/150ML IV SOLN
750.0000 mg | Freq: Every day | INTRAVENOUS | Status: DC
  Administered 2014-07-06 – 2014-07-07 (×2): 750 mg via INTRAVENOUS
  Filled 2014-07-06 (×2): qty 150

## 2014-07-06 MED ORDER — VITAMIN B-12 1000 MCG PO TABS
1000.0000 ug | ORAL_TABLET | Freq: Every day | ORAL | Status: DC
  Administered 2014-07-07 – 2014-07-08 (×2): 1000 ug via ORAL
  Filled 2014-07-06 (×2): qty 1

## 2014-07-06 NOTE — Progress Notes (Signed)
[facility] / [facility]. [employer] has provided prior authorization for placement [facility] / [facility] if stable for d/c. CSW will assist with d/c planning as needed.  Cori RazorJamie Traci Gafford LCSW [phone]

## 2014-07-06 NOTE — Progress Notes (Signed)
[facility] PROGRESS NOTE  Kathy BailBetty R Thomas ZOX:[id number] DOB: [birth year] DOA: 07/04/2014 PCP: Oliver BarreJames John, MD  Summary I have seen and examined Kathy Thomas at bedside in the presence of her granddaughter. Appreciated orthopedics. Kathy Thomas is a Pleasant 80 y.o. female with past medical history of Acute sinusitis, unspecified (05/28/2008); ANEMIA-IRON DEFICIENCY (10/26/2008); ANXIETY (10/06/2007); CHOLELITHIASIS (10/06/2007); COLONIC POLYPS, HX OF (01/26/2007); DIABETES MELLITUS, TYPE II (10/06/2007); DIVERTICULOSIS, COLON (01/26/2007); ECCHYMOSES, SPONTANEOUS (06/04/2010); GERD (01/26/2007); HERNIATED DISC (01/26/2007); HYPERLIPIDEMIA (10/06/2007); HYPERTENSION (01/26/2007); LOW BACK PAIN (01/29/2007); MENOPAUSAL DISORDER (10/26/2008); OSTEOPOROSIS (06/04/2010); and SHOULDER PAIN, LEFT (10/06/2007), who was admitted with an acute left fibular fracture after recurrent falls lately. CT of the leg showed "Acute posttraumatic nondisplaced fracture of the proximal left fibular metaphysis. No additional fractures identified. Diffuse bone demineralization suggest underlying osteoporosis". Question was why she has these falls lately- she seems quite coherent, therefore it may not be necessarily related to dementia alone. We therefore obtained CT brain(MRI would have been better but patient is reportedly severely claustrophobic- had to get open MRI last year, but struggled to get it completed even then)- CT brain showed no acute intracranial abnormalities and mild chronic small vessel disease, vitamin B12 level was low in 2015-therefore vitamin b12 deficiency maybe contributing to the falls. Will therefore give vitamin B-12 supplements. Tsh/rpr/cardiac enzymes are normal. Repeat UA(low grade temp of 80F, UA cloudy with bacteruria) suggests UTI. Chest x-ray shows "Pulmonary vascular congestion without overt edema. Otherwise no acute cardiopulmonary abnormality". Will add Levaquin/vitamin B12 supplements. Check BNP/2-D  echocardiogram. Patient referred to short-term rehabilitation which family is in agreement with. Family currently looking at available facilities. Plan Left fibular fracture/Closed fibular fracture/Dementia/Vitamin B12 deficiency  Vitamin B12 supplements  Short-term rehabilitation  Follow orthopedics, conditions UTI (urinary tract infection)  Levaquin  2-D echo/BNP in view of abnormal chest x-ray which may be due to infection DM type 2 (diabetes mellitus, type 2)/Essential hypertension  No acute changes  Continue current management Code Status: Full Family Communication: Granddaughter at bedside Disposition Plan: To short-term rehabilitation   Consultants:  Orthopedics  Procedures:  None  Antibiotics:  Levaquin 07/06/2014>  HPI/Subjective: Feels about the same  Objective: Filed Vitals:   07/06/14 0515  BP: 153/76  Pulse: 74  Temp: 98.2 F (36.8 C)  Resp: 20    Intake/Output Summary (Last 24 hours) at 07/06/14 0939 Last data filed at 07/06/14 0530  Gross per 24 hour  Intake   1375 ml  Output    850 ml  Net    525 ml   Filed Weights   07/05/14 0240  Weight: 89.4 kg (197 lb 1.5 oz)    Exam:   General:  Comfortable at rest.  Cardiovascular: S1-S2 normal. No murmurs. Pulse regular.  Respiratory: Good air entry bilaterally. No rhonchi or rales.  Abdomen: Soft and nontender. Normal bowel sounds. No organomegaly.  Musculoskeletal: No pedal edema   Neurological: Intact  Data Reviewed: Basic Metabolic Panel:  Recent Labs Lab 07/04/14 2050 07/05/14 0155 07/05/14 0525 07/06/14 0542  NA 142 140 140 137  K 3.9 3.9 3.6 4.1  CL 102 99 101 101  CO2 27  --  30 28  GLUCOSE 165* 131* 138* 152*  BUN 26* 27* 24* 17  CREATININE 0.95 0.80 0.91 0.71  CALCIUM 8.7  --  8.3* 8.6  MG  --   --  1.7 1.7  PHOS  --   --  3.4 3.5   Liver Function Tests:  Recent Labs Lab 07/05/14 0525 07/06/14  0542  AST 15 16  ALT 11 13  ALKPHOS 65 68  BILITOT 0.7  0.8  PROT 6.5 7.1  ALBUMIN 3.3* 3.4*   No results for input(s): LIPASE, AMYLASE in the last 168 hours. No results for input(s): AMMONIA in the last 168 hours. CBC:  Recent Labs Lab July 11, 2014 2050 07/05/14 0155 07/05/14 0525 07/06/14 0542  WBC 9.7  --  8.6 7.5  NEUTROABS 6.5  --   --   --   HGB 12.1 13.6 10.8* 11.7*  HCT 39.2 40.0 34.9* 37.9  MCV 88.9  --  88.6 88.6  PLT 240  --  253 265   Cardiac Enzymes:  Recent Labs Lab 07/05/14 0800  CKTOTAL 52  CKMB 1.3  TROPONINI <0.03   BNP (last 3 results) No results for input(s): BNP in the last 8760 hours.  ProBNP (last 3 results) No results for input(s): PROBNP in the last 8760 hours.  CBG:  Recent Labs Lab 07/05/14 0739 07/05/14 1220 07/05/14 1700 07/05/14 2257 07/06/14 0803  GLUCAP 130* 154* 134* 108* 131*    No results found for this or any previous visit (from the past 240 hour(s)).   Studies: Dg Ankle Complete Left  July 11, 2014   CLINICAL DATA:  Patient fell on Sunday and went to [facility] orthopedics on Monday. Diagnosed as a sprain. Swelling and bruising have increased since then. Increased pain now radiating up to the left knee.  EXAM: LEFT ANKLE COMPLETE - 3+ VIEW  COMPARISON:  None.  FINDINGS: Old postoperative changes with screw and wire fixation of the distal fibula. Diffuse bone demineralization. Degenerative changes in the left ankle and intertarsal joints. Plantar and Achilles spurs of the calcaneus. Mild diffuse soft tissue swelling about the left ankle. No acute fracture or dislocation is appreciated. Vascular calcifications.  IMPRESSION: Diffuse degenerative change and demineralization in the left ankle. Soft tissue swelling. No acute fractures identified.   Electronically Signed   By: Burman Nieves M.D.   On: 07/11/2014 20:30   Ct Head Wo Contrast  07/05/2014   CLINICAL DATA:  Fall 4 days ago.  Dementia.  EXAM: CT HEAD WITHOUT CONTRAST  TECHNIQUE: Contiguous axial images were obtained from the base  of the skull through the vertex without intravenous contrast.  COMPARISON:  Brain MRI on 10/03/2013  FINDINGS: There is no evidence of intracranial hemorrhage, brain edema, or other signs of acute infarction. There is no evidence of intracranial mass lesion or mass effect. No abnormal extraaxial fluid collections are identified.  Mild chronic small vessel disease is noted. Ventricles are normal in size.  IMPRESSION: No acute intracranial findings.  Mild chronic small vessel disease.   Electronically Signed   By: Myles Rosenthal M.D.   On: 07/05/2014 09:06   Ct Tibia Fibula Left Wo Contrast  07-11-2014   CLINICAL DATA:  Nondisplaced fractures of the proximal fibula demonstrated on plain film radiographs. History of a fall.  EXAM: CT TIBIA FIBULA LEFT WITHOUT CONTRAST  TECHNIQUE: Multidetector CT imaging was performed according to the standard protocol. Multiplanar CT image reconstructions were also generated.  COMPARISON:  Left knee 11-Jul-2014  FINDINGS: Acute nondisplaced fracture of the proximal left fibular metaphysis. No fractures demonstrated in the distal femur or of the tibia. Postoperative changes demonstrated in the distal fibula with screw and wire fixation. Degenerative changes in the knee with narrowed medial and lateral compartments and associated hypertrophic changes. Diffuse bone demineralization suggest osteoporosis. Vascular calcifications. No significant effusion at the knee.  IMPRESSION: Acute posttraumatic nondisplaced  fracture of the proximal left fibular metaphysis. No additional fractures identified. Diffuse bone demineralization suggest underlying osteoporosis.   Electronically Signed   By: Burman Nieves M.D.   On: 07/04/2014 22:54   Dg Chest Port 1 View  07/05/2014   CLINICAL DATA:  80 year old female with recent fall. Low grade fever. Fibula fracture. Initial encounter.  EXAM: PORTABLE CHEST - 1 VIEW  COMPARISON:  None.  FINDINGS: Portable AP upright view at 0747 hours. Normal cardiac  size and mediastinal contours. Visualized tracheal air column is within normal limits. Lung volumes within normal limits allowing for portable technique. Mild increased interstitial markings diffusely. Otherwise allowing for portable technique, the lungs are clear. No pneumothorax or pleural effusion.  IMPRESSION: Pulmonary vascular congestion without overt edema. Otherwise no acute cardiopulmonary abnormality.   Electronically Signed   By: Odessa Fleming M.D.   On: 07/05/2014 08:10   Dg Knee Complete 4 Views Left  07/04/2014   CLINICAL DATA:  Fall on Sunday.  Ankle pain on bruising and swelling  EXAM: LEFT KNEE - COMPLETE 4+ VIEW  COMPARISON:  None.  FINDINGS: No fracture of the proximal tibia or distal femur. Patella is normal. No joint effusion. There is a subtle lucency at the metadiaphysis of the proximal fibula concerning for nondisplaced fracture.  IMPRESSION: Probable nondisplaced fracture of the proximal fibula. No evidence of fracture at the knee joint.   Electronically Signed   By: Genevive Bi M.D.   On: 07/04/2014 20:28    Scheduled Meds: . aspirin EC  81 mg Oral Daily  . atorvastatin  40 mg Oral Daily  . cyanocobalamin  1,000 mcg Intramuscular Once  . docusate sodium  100 mg Oral BID  . donepezil  10 mg Oral QHS  . enoxaparin (LOVENOX) injection  40 mg Subcutaneous Daily  . losartan  100 mg Oral Daily   And  . hydrochlorothiazide  12.5 mg Oral Daily  . insulin aspart  0-15 Units Subcutaneous TID WC  . levofloxacin (LEVAQUIN) IV  750 mg Intravenous Daily  . sodium chloride  3 mL Intravenous Q12H  . [START ON 07/07/2014] vitamin B-12  1,000 mcg Oral Daily   Continuous Infusions: . sodium chloride 50 mL (07/06/14 0520)     Time spent: 25 minutes    Yoselin Amerman  [facility] Pager [phone]. If 7PM-7AM, please contact night-coverage at [email or web address], password [facility] 07/06/2014, 9:39 AM  LOS: 1 day

## 2014-07-06 NOTE — Care Management Note (Signed)
    Page 1 of 1   07/06/2014     7:49:24 AM CARE MANAGEMENT NOTE 07/06/2014  Patient:  Mickie BailMCQUEEN,Autum R   Account Number:  [id number]  Date Initiated:  07/05/2014  Documentation initiated by:  [facility] LLCJEFFRIES,Joell Buerger  Subjective/Objective Assessment:   WJX:BJYNadm:Left knee pain     Action/Plan:   discharge planning   Anticipated DC Date:  07/06/2014   Anticipated DC Plan:  SKILLED NURSING FACILITY      DC Planning Services  CM consult      Choice offered to / List presented to:             Status of service:  Completed, signed off Medicare Important Message given?   (If response is "NO", the following Medicare IM given date fields will be blank) Date Medicare IM given:   Medicare IM given by:   Date Additional Medicare IM given:   Additional Medicare IM given by:    Discharge Disposition:  SKILLED NURSING FACILITY  Per UR Regulation:  Reviewed for med. necessity/level of care/duration of stay  If discussed at Long Length of Stay Meetings, dates discussed:    Comments:  07/05/14 15:30 Cm notes pt to go to SNF; CSW arranging.  No other Cm needs were communicated.  Freddy JakschSarah Nasario Czerniak, BSN, CM [phone].

## 2014-07-06 NOTE — Progress Notes (Signed)
Physical Therapy Treatment Patient Details Name: Kathy Thomas Hockett MRN: [id number] DOB: Dec 24, 1934 Today's Date: 07/06/2014    History of Present Illness This 80 year old female fell and presented to ED with L ankle pain.  Sent home with cam boot.  Returned 3 days later with L knee pain and swelling. She was found to have a nondisplaced proximal fibula fx on the L.      PT Comments    Pt OOB in recliner with Granddaughter in room.  Assisted pt to BR then amb a limited distance in hallway.  Pt demonstrates a very unsteady gait and impaired safety cognition(easily distracted).  HIGH FALL RISK.  Follow Up Recommendations  SNF     Equipment Recommendations       Recommendations for Other Services       Precautions / Restrictions Precautions Precautions: Fall Restrictions Weight Bearing Restrictions: No Other Position/Activity Restrictions: WBAT    Mobility  Bed Mobility               General bed mobility comments: pt OOB in recliner  Transfers Overall transfer level: Needs assistance Equipment used: Rolling walker (2 wheeled)   Sit to Stand: Min assist;Mod assist         General transfer comment: assistance to rise and steady.  50% VC's on safety with turns.  Ambulation/Gait Ambulation/Gait assistance: Min assist Ambulation Distance (Feet): 42 Feet Assistive device: Rolling walker (2 wheeled) Gait Pattern/deviations: Step-to pattern;Trunk flexed Gait velocity: dedcreased   General Gait Details: cues for posture, position from RW and sequence.  pt demonstartes impaired safety cognition and easily distracted.    Stairs            Wheelchair Mobility    Modified Rankin (Stroke Patients Only)       Balance                                    Cognition                            Exercises      General Comments        Pertinent Vitals/Pain Pain Assessment: 0-10 Pain Score: 3  Pain Location: L knee Pain  Descriptors / Indicators: Sore Pain Intervention(s): Monitored during session;RN gave pain meds during session    Home Living                      Prior Function            PT Goals (current goals can now be found in the care plan section) Progress towards PT goals: Progressing toward goals    Frequency  Min 3X/week    PT Plan      Co-evaluation             End of Session Equipment Utilized During Treatment: Gait belt Activity Tolerance: Patient tolerated treatment well Patient left: in chair;with call bell/phone within reach;with family/visitor present     Time: [id number] PT Time Calculation (min) (ACUTE ONLY): 28 min  Charges:  $Gait Training: 8-22 mins $Therapeutic Activity: 8-22 mins                    G Codes:      Felecia ShellingLori Dwayne Begay  PTA [facility]  Acute  Rehab Pager      [phone]

## 2014-07-06 NOTE — Progress Notes (Signed)
Echocardiogram 2D Echocardiogram has been performed.  Dong Nimmons 07/06/2014, 3:20 PM

## 2014-07-06 NOTE — Discharge Instructions (Signed)
WBAT LLE Ice to left knee as needed for swelling and pain Knee immobilizer for comfort only

## 2014-07-06 NOTE — Progress Notes (Signed)
Patient ID: Kathy Thomas, female   DOB: 01/05/1935, 80 y.o.   MRN: [id number]  Doing OK for now Limited functional ability and support at home  WBAT LLE  RTC in 2-3 weeks Knee immobilizer as needed for comfort Ice to knee for swelling and pain

## 2014-07-07 DIAGNOSIS — I5030 Unspecified diastolic (congestive) heart failure: Secondary | ICD-10-CM | POA: Diagnosis present

## 2014-07-07 DIAGNOSIS — I5022 Chronic systolic (congestive) heart failure: Secondary | ICD-10-CM | POA: Diagnosis present

## 2014-07-07 LAB — CBC
HEMATOCRIT: 35.6 % — AB (ref 36.0–46.0)
Hemoglobin: 10.9 g/dL — ABNORMAL LOW (ref 12.0–15.0)
MCH: 27 pg (ref 26.0–34.0)
MCHC: 30.6 g/dL (ref 30.0–36.0)
MCV: 88.3 fL (ref 78.0–100.0)
Platelets: 248 10*3/uL (ref 150–400)
RBC: 4.03 MIL/uL (ref 3.87–5.11)
RDW: 13.2 % (ref 11.5–15.5)
WBC: 6.8 10*3/uL (ref 4.0–10.5)

## 2014-07-07 LAB — BASIC METABOLIC PANEL
Anion gap: 7 (ref 5–15)
BUN: 15 mg/dL (ref 6–23)
CO2: 28 mmol/L (ref 19–32)
Calcium: 8.6 mg/dL (ref 8.4–10.5)
Chloride: 103 mmol/L (ref 96–112)
Creatinine, Ser: 0.78 mg/dL (ref 0.50–1.10)
GFR calc Af Amer: >90 mL/min (ref >90)
GFR, EST NON AFRICAN AMERICAN: 79 mL/min — AB (ref >90)
GLUCOSE: 129 mg/dL — AB (ref 70–99)
POTASSIUM: 3.9 mmol/L (ref 3.5–5.1)
SODIUM: 138 mmol/L (ref 135–145)

## 2014-07-07 LAB — GLUCOSE, CAPILLARY
GLUCOSE-CAPILLARY: 126 mg/dL — AB (ref 70–99)
GLUCOSE-CAPILLARY: 167 mg/dL — AB (ref 70–99)
Glucose-Capillary: 202 mg/dL — ABNORMAL HIGH (ref 70–99)
Glucose-Capillary: 95 mg/dL (ref 70–99)

## 2014-07-07 LAB — LIPID PANEL
Cholesterol: 116 mg/dL (ref 0–200)
HDL: 30 mg/dL — ABNORMAL LOW (ref >39)
LDL CALC: 72 mg/dL (ref 0–99)
Total CHOL/HDL Ratio: 3.9 RATIO
Triglycerides: 68 mg/dL (ref <150)
VLDL: 14 mg/dL (ref 0–40)

## 2014-07-07 MED ORDER — FUROSEMIDE 20 MG PO TABS: 20.0000 mg | ORAL_TABLET | Freq: Every day | ORAL | Status: DC

## 2014-07-07 MED ORDER — LEVOFLOXACIN 500 MG PO TABS
500.0000 mg | ORAL_TABLET | Freq: Every day | ORAL | Status: DC
  Administered 2014-07-08: 500 mg via ORAL
  Filled 2014-07-07 (×2): qty 1

## 2014-07-07 NOTE — Progress Notes (Signed)
Medicare Important Message given? YES  Date Medicare IM given:  07/07/2014 Medicare IM given by: Isidoro DonningSHAVIS, Samoria Fedorko

## 2014-07-07 NOTE — Progress Notes (Signed)
[facility] PROGRESS NOTE  Kathy BailBetty R Thomas JYN:[id number] DOB: 1934/12/25 DOA: 07/04/2014 PCP: Oliver BarreJames John, MD  Summary I have seen and examined Kathy Thomas at bedside in the presence of her daughter. Appreciate orthopedics. Kathy Thomas is apleasant 80 y.o. female with past medical history of acute sinusitis, unspecified (05/28/2008); ANEMIA-IRON DEFICIENCY (10/26/2008); ANXIETY (10/06/2007); CHOLELITHIASIS (10/06/2007); COLONIC POLYPS, HX OF (01/26/2007); DIABETES MELLITUS, TYPE II (10/06/2007); DIVERTICULOSIS, COLON (01/26/2007); ECCHYMOSES, SPONTANEOUS (06/04/2010); GERD (01/26/2007); HERNIATED DISC (01/26/2007); HYPERLIPIDEMIA (10/06/2007); HYPERTENSION (01/26/2007); LOW BACK PAIN (01/29/2007); MENOPAUSAL DISORDER (10/26/2008); OSTEOPOROSIS (06/04/2010); and SHOULDER PAIN, LEFT (10/06/2007), who was admitted with an acute left fibular fracture after recurrent falls lately. CT of the leg showed "Acute posttraumatic nondisplaced fracture of the proximal left fibular metaphysis. No additional fractures identified. Diffuse bone demineralization suggest underlying osteoporosis". Question was why she has these falls lately as she has been quite coherent, therefore falls not necessarily related to dementia alone. She therefore had CT brain(MRI would have been better but patient is reportedly severely claustrophobic- had to get open MRI last year, but struggled to get it completed even then)- CT brain showed no acute intracranial abnormalities and mild chronic small vessel disease, vitamin B12 level was low in 2015-therefore vitamin b12 deficiency maybe contributing to the falls. She will be on vitamin B-12 supplements. Tsh/rpr/cardiac enzymes were normal and repeat UA(low grade temp of 76F, UA cloudy with bacteruria) suggested UTI. Chest x-ray showed "Pulmonary vascular congestion without overt edema. Otherwise no acute cardiopulmonary abnormality". Patient was therefore started on Levaquin/vitamin B12 supplements on 07/06/14.  Her BNP was normal but 2-D echocardiogram suggested EF of 40% with global hypokinesis.  cardiac MRI was recommended but patient unlikely to tolerate it given history of severe claustrophobia. We'll therefore consult cardiology to explore options for further workup. She is on aspirin/ARB/statin/diuretic and this will be continued. Patient was referred to short-term rehabilitation which family is in agreement with and has settled on a facility where patient will transfer once medical issues addressed.  Plan Left fibular fracture/Closed fibular fracture/Dementia/Vitamin B12 deficiency  Vitamin B12 supplements  Short-term rehabilitation  Follow orthopedics recommendations UTI (urinary tract infection)  Levaquin  2-D echo/BNP in view of abnormal chest x-ray which may be due to infection DM type 2 (diabetes mellitus, type 2)/Essential hypertension/Chronic systolic CHF EF 40%  Consult cardiology  Continue aspirin/ARB/diuretic/statin Code Status: Full Family Communication: daughter at bedside Disposition Plan: To short-term rehabilitation   Consultants:  Orthopedics   cardiology- left message with [facility]  Procedures:  None  Antibiotics:  Levaquin 07/06/2014>  HPI/Subjective: Feels better. Says pain is better.  Objective: Filed Vitals:   07/07/14 0543  BP: 157/80  Pulse: 69  Temp: 98.6 F (37 C)  Resp: 16    Intake/Output Summary (Last 24 hours) at 07/07/14 1318 Last data filed at 07/07/14 0650  Gross per 24 hour  Intake 991.67 ml  Output      0 ml  Net 991.67 ml   Filed Weights   07/05/14 0240  Weight: 89.4 kg (197 lb 1.5 oz)    Exam:   General:  Comfortable at rest.  Cardiovascular: S1-S2 normal. No murmurs. Pulse regular.  Respiratory: Good air entry bilaterally. No rhonchi or rales.  Abdomen: Soft and nontender. Normal bowel sounds. No organomegaly.  Musculoskeletal: No pedal edema   Neurological: Intact  Data Reviewed: Basic Metabolic  Panel:  Recent Labs Lab 07/04/14 2050 07/05/14 0155 07/05/14 0525 07/06/14 0542 07/07/14 0500  NA 142 140 140 137 138  K 3.9 3.9  3.6 4.1 3.9  CL 102 99 101 101 103  CO2 27  --  GLUCOSE 165* 131* 138* 152* 129*  BUN 26* 27* 24* 17 15  CREATININE 0.95 0.80 0.91 0.71 0.78  CALCIUM 8.7  --  8.3* 8.6 8.6  MG  --   --  1.7 1.7  --   PHOS  --   --  3.4 3.5  --    Liver Function Tests:  Recent Labs Lab 07/05/14 0525 07/06/14 0542  AST 15 16  ALT 11 13  ALKPHOS 65 68  BILITOT 0.7 0.8  PROT 6.5 7.1  ALBUMIN 3.3* 3.4*   No results for input(s): LIPASE, AMYLASE in the last 168 hours. No results for input(s): AMMONIA in the last 168 hours. CBC:  Recent Labs Lab 07/04/14 2050 07/05/14 0155 07/05/14 0525 07/06/14 0542 07/07/14 0500  WBC 9.7  --  8.6 7.5 6.8  NEUTROABS 6.5  --   --   --   --   HGB 12.1 13.6 10.8* 11.7* 10.9*  HCT 39.2 40.0 34.9* 37.9 35.6*  MCV 88.9  --  88.6 88.6 88.3  PLT 240  --  253 265 248   Cardiac Enzymes:  Recent Labs Lab 07/05/14 0800  CKTOTAL 52  CKMB 1.3  TROPONINI <0.03   BNP (last 3 results)  Recent Labs  07/06/14 0542  BNP 44.5    ProBNP (last 3 results) No results for input(s): PROBNP in the last 8760 hours.  CBG:  Recent Labs Lab 07/06/14 1214 07/06/14 1711 07/06/14 2102 07/07/14 0724 07/07/14 1202  GLUCAP 152* 76 122* 126* 167*    Recent Results (from the past 240 hour(s))  Culture, Urine     Status: None   Collection Time: 07/05/14  9:32 AM  Result Value Ref Range Status   Specimen Description URINE, CLEAN CATCH  Final   Special Requests NONE  Final   Colony Count NO GROWTH Performed at [facility]   Final   Culture NO GROWTH Performed at [facility]   Final   Report Status 07/06/2014 FINAL  Final     Studies: No results found.  Scheduled Meds: . aspirin EC  81 mg Oral Daily  . atorvastatin  40 mg Oral Daily  . docusate sodium  100 mg Oral BID  . donepezil  10 mg  Oral QHS  . enoxaparin (LOVENOX) injection  40 mg Subcutaneous Daily  . losartan  100 mg Oral Daily   And  . hydrochlorothiazide  12.5 mg Oral Daily  . insulin aspart  0-15 Units Subcutaneous TID WC  . [START ON 07/08/2014] levofloxacin  500 mg Oral Daily  . sodium chloride  3 mL Intravenous Q12H  . vitamin B-12  1,000 mcg Oral Daily   Continuous Infusions:    Time spent: 20 minutes    Tamiya Colello  [facility] Pager [phone]. If 7PM-7AM, please contact night-coverage at [email or web address], password [facility] 07/07/2014, 1:18 PM  LOS: 2 days

## 2014-07-07 NOTE — Progress Notes (Signed)
Spoke with Dr Elease HashimotoNahser who recommended outpatient consult. Will plan for discharge to SNF tomorrow if patient continues to do ok.

## 2014-07-08 DIAGNOSIS — R531 Weakness: Secondary | ICD-10-CM | POA: Insufficient documentation

## 2014-07-08 MED ORDER — LEVOFLOXACIN 500 MG PO TABS: 500.0000 mg | ORAL_TABLET | Freq: Every day | ORAL | Status: DC

## 2014-07-08 MED ORDER — HYDROCODONE-ACETAMINOPHEN 5-325 MG PO TABS: 1.0000 | ORAL_TABLET | Freq: Four times a day (QID) | ORAL | Status: DC | PRN

## 2014-07-08 MED ORDER — CYANOCOBALAMIN 1000 MCG PO TABS: 1000.0000 ug | ORAL_TABLET | Freq: Every day | ORAL | Status: AC

## 2014-07-08 MED ORDER — SENNA 8.6 MG PO TABS: 1.0000 | ORAL_TABLET | Freq: Every day | ORAL | Status: DC | PRN

## 2014-07-08 NOTE — Assessment & Plan Note (Addendum)
?   Post trauamatic effusion vs fx, cannot do film at this time after hrs, but will order for tomorrow , also for pain control, family may decide to present to ER since we are not able to do films or UA tonight

## 2014-07-08 NOTE — Assessment & Plan Note (Signed)
Likely sprain, cannot do film at this time after hrs, but will order for tomorrow

## 2014-07-08 NOTE — Assessment & Plan Note (Signed)
Etiology unclear, exam ow benign, for lab tomorrow, family may elect to go to ER tonight

## 2014-07-08 NOTE — Progress Notes (Signed)
[facility]. All questions answered.

## 2014-07-08 NOTE — Assessment & Plan Note (Signed)
stable overall by history and exam, recent data reviewed with pt, and pt to continue medical treatment as before,  to f/u any worsening symptoms or concerns BP Readings from Last 3 Encounters:  07/08/14 137/65  07/04/14 130/90  01/10/14 122/76

## 2014-07-08 NOTE — Discharge Summary (Signed)
Kathy Thomas, is a 80 y.o. female  DOB 1935/05/21  MRN [id number].  Admission date:  07/04/2014  Admitting Physician  Therisa Doyne, MD  Discharge Date:  07/08/2014   Primary MD  Oliver Barre, MD  Recommendations for primary care physician for things to follow:  Please refer to cardiology for further work up of CHF- EF 40%, Cardiology recommended outpatient work up.   Admission Diagnosis   Fall [W19.XXXA] Ankle sprain, left, initial encounter [S93.402A] Closed fibular fracture, left, initial encounter [S82.402A]   Discharge Diagnosis  Fall [W19.XXXA] Ankle sprain, left, initial encounter [S93.402A] Closed fibular fracture, left, initial encounter [S82.402A]   Active Problems:   Left fibular fracture   Closed fibular fracture   DM type 2 (diabetes mellitus, type 2)   Essential hypertension   Dementia   Vitamin B12 deficiency   Systolic CHF, chronic      Hospital Course  Kathy Thomas is apleasant 80 y.o. female with past medical history of acute sinusitis, unspecified (05/28/2008); ANEMIA-IRON DEFICIENCY (10/26/2008); ANXIETY (10/06/2007); CHOLELITHIASIS (10/06/2007); COLONIC POLYPS, HX OF (01/26/2007); DIABETES MELLITUS, TYPE II (10/06/2007); DIVERTICULOSIS, COLON (01/26/2007); ECCHYMOSES, SPONTANEOUS (06/04/2010); GERD (01/26/2007); HERNIATED DISC (01/26/2007); HYPERLIPIDEMIA (10/06/2007); HYPERTENSION (01/26/2007); LOW BACK PAIN (01/29/2007); MENOPAUSAL DISORDER (10/26/2008); OSTEOPOROSIS (06/04/2010); and SHOULDER PAIN, LEFT (10/06/2007), who was admitted with an acute left fibular fracture after recurrent falls lately. CT of the leg showed "Acute posttraumatic nondisplaced fracture of the proximal left fibular metaphysis. No additional fractures identified. Diffuse bone demineralization suggest underlying osteoporosis".  Question was why she had these falls lately as she has been quite coherent, therefore falls not necessarily related to dementia alone. She therefore had CT brain(MRI would have been better but patient is reportedly severely claustrophobic- had to get open MRI last year, but struggled to get it completed even then)- CT brain showed no acute intracranial abnormalities and mild chronic small vessel disease, vitamin B12 level was low in 2015-therefore vitamin b12 deficiency maybe contributing to the falls. She will be on vitamin B-12 supplements again. Tsh/rpr/cardiac enzymes were normal and repeat UA(low grade temp of 68F, UA cloudy with bacteruria) suggested UTI- urine culture negative. Chest x-ray showed "Pulmonary vascular congestion without overt edema. Otherwise no acute cardiopulmonary abnormality". Patient was therefore started on Levaquin/vitamin B12 supplements on 07/06/14. Her BNP was normal but 2-D echocardiogram suggested EF of 40% with global hypokinesis. Cardiac MRI was recommended but patient unlikely to tolerate it given history of severe claustrophobia. I discussed with Dr Elease Hashimoto on call for cardiology to explore options for further workup, and he recommended outpatient cardiology evaluation. She is on aspirin/ARB/statin/diuretic and this will be continued. Patient was referred to short-term rehabilitation which family is in agreement with and has settled on a facility where patient will transfer today. She should complete 4 more days of Levaquin for UTI and follow Orthopedics recommendations.    Discharge Condition Stable.  Consults obtained  None  Follow UP Follow-up Information    Follow up with Shelda Pal, MD In 3 weeks.   Specialty:  Orthopedic Surgery   Why:  For X-ray follow up of left proximal fibula fracture   Contact information:   [address] [place] [address] [phone]         Discharge Instructions  and  Discharge Medications    Discharge Instructions    Diet - low sodium heart healthy    Complete by:  As directed      Diet Carb Modified    Complete by:  As directed      Increase activity slowly    Complete by:  As directed             Medication List    TAKE these medications        aspirin EC 81 MG tablet  Take 1 tablet (81 mg total) by mouth daily.     atorvastatin 40 MG tablet  Commonly known as:  LIPITOR  TAKE 1 TABLET BY MOUTH ONCE A DAY     Calcium 1200-1000 MG-UNIT Chew  Chew by mouth daily.     cyanocobalamin 1000 MCG tablet  Take 1 tablet (1,000 mcg total) by mouth daily.     donepezil 10 MG tablet  Commonly known as:  ARICEPT  Take 1 tablet (10 mg total) by mouth at bedtime.     HYDROcodone-acetaminophen 5-325 MG per tablet  Commonly known as:  NORCO/VICODIN  Take 1 tablet by mouth every 6 (six) hours as needed for moderate pain.     levofloxacin 500 MG tablet  Commonly known as:  LEVAQUIN  Take 1 tablet (500 mg total) by mouth daily with breakfast.     losartan-hydrochlorothiazide 100-12.5 MG per tablet  Commonly known as:  HYZAAR  TAKE 1 TABLET BY MOUTH ONCE A DAY     metFORMIN 500 MG 24 hr tablet  Commonly known as:  GLUCOPHAGE-XR  TAKE 2 TABLETS BY MOUTH ONCE DAILY WITH BREAKFAST     senna 8.6 MG Tabs tablet  Commonly known as:  SENOKOT  Take 1 tablet (8.6 mg total) by mouth daily as needed for mild constipation.        Diet and Activity recommendation: See Discharge Instructions above  Major procedures and Radiology Reports - PLEASE review detailed and final reports for all details, in brief -    Dg Ankle Complete Left  07/04/2014   CLINICAL DATA:  Patient fell on Sunday and [facility]. Diagnosed as a sprain. Swelling and bruising have increased since then. Increased pain now radiating up to the left knee.  EXAM: LEFT ANKLE COMPLETE - 3+ VIEW  COMPARISON:  None.  FINDINGS: Old postoperative changes with screw and wire fixation of the  distal fibula. Diffuse bone demineralization. Degenerative changes in the left ankle and intertarsal joints. Plantar and Achilles spurs of the calcaneus. Mild diffuse soft tissue swelling about the left ankle. No acute fracture or dislocation is appreciated. Vascular calcifications.  IMPRESSION: Diffuse degenerative change and demineralization in the left ankle. Soft tissue swelling. No acute fractures identified.   Electronically Signed   By: Burman NievesWilliam  Stevens M.D.   On: 07/04/2014 20:30   Ct Head Wo Contrast  07/05/2014   CLINICAL DATA:  Fall 4 days ago.  Dementia.  EXAM: CT HEAD WITHOUT CONTRAST  TECHNIQUE: Contiguous axial images were obtained from the base of the skull through the vertex without intravenous contrast.  COMPARISON:  Brain MRI on 10/03/2013  FINDINGS: There is no evidence of intracranial hemorrhage, brain edema, or other signs of acute  infarction. There is no evidence of intracranial mass lesion or mass effect. No abnormal extraaxial fluid collections are identified.  Mild chronic small vessel disease is noted. Ventricles are normal in size.  IMPRESSION: No acute intracranial findings.  Mild chronic small vessel disease.   Electronically Signed   By: Myles Rosenthal M.D.   On: 07/05/2014 09:06   Ct Tibia Fibula Left Wo Contrast  07/04/2014   CLINICAL DATA:  Nondisplaced fractures of the proximal fibula demonstrated on plain film radiographs. History of a fall.  EXAM: CT TIBIA FIBULA LEFT WITHOUT CONTRAST  TECHNIQUE: Multidetector CT imaging was performed according to the standard protocol. Multiplanar CT image reconstructions were also generated.  COMPARISON:  Left knee 07/04/2014  FINDINGS: Acute nondisplaced fracture of the proximal left fibular metaphysis. No fractures demonstrated in the distal femur or of the tibia. Postoperative changes demonstrated in the distal fibula with screw and wire fixation. Degenerative changes in the knee with narrowed medial and lateral compartments and  associated hypertrophic changes. Diffuse bone demineralization suggest osteoporosis. Vascular calcifications. No significant effusion at the knee.  IMPRESSION: Acute posttraumatic nondisplaced fracture of the proximal left fibular metaphysis. No additional fractures identified. Diffuse bone demineralization suggest underlying osteoporosis.   Electronically Signed   By: Burman Nieves M.D.   On: 07/04/2014 22:54   Dg Chest Port 1 View  07/05/2014   CLINICAL DATA:  80 year old female with recent fall. Low grade fever. Fibula fracture. Initial encounter.  EXAM: PORTABLE CHEST - 1 VIEW  COMPARISON:  None.  FINDINGS: Portable AP upright view at 0747 hours. Normal cardiac size and mediastinal contours. Visualized tracheal air column is within normal limits. Lung volumes within normal limits allowing for portable technique. Mild increased interstitial markings diffusely. Otherwise allowing for portable technique, the lungs are clear. No pneumothorax or pleural effusion.  IMPRESSION: Pulmonary vascular congestion without overt edema. Otherwise no acute cardiopulmonary abnormality.   Electronically Signed   By: Odessa Fleming M.D.   On: 07/05/2014 08:10   Dg Knee Complete 4 Views Left  07/04/2014   CLINICAL DATA:  Fall on Sunday.  Ankle pain on bruising and swelling  EXAM: LEFT KNEE - COMPLETE 4+ VIEW  COMPARISON:  None.  FINDINGS: No fracture of the proximal tibia or distal femur. Patella is normal. No joint effusion. There is a subtle lucency at the metadiaphysis of the proximal fibula concerning for nondisplaced fracture.  IMPRESSION: Probable nondisplaced fracture of the proximal fibula. No evidence of fracture at the knee joint.   Electronically Signed   By: Genevive Bi M.D.   On: 07/04/2014 20:28    Micro Results   Recent Results (from the past 240 hour(s))  Culture, Urine     Status: None   Collection Time: 07/05/14  9:32 AM  Result Value Ref Range Status   Specimen Description URINE, CLEAN CATCH  Final    Special Requests NONE  Final   Colony Count NO GROWTH Performed at [facility]   Final   Culture NO GROWTH Performed at [facility]   Final   Report Status 07/06/2014 FINAL  Final       Today   Subjective:   Donicia Druck today has no headache,no chest abdominal pain,no new weakness tingling or numbness, feels much better wants to go home today.   Objective:   Blood pressure 137/65, pulse 70, temperature 98.1 F (36.7 C), temperature source Oral, resp. rate 20, height 5' (1.524 m), weight 89.4 kg (197 lb 1.5 oz), SpO2  96 %.   Intake/Output Summary (Last 24 hours) at 07/08/14 0714 Last data filed at 07/08/14 0700  Gross per 24 hour  Intake    123 ml  Output      1 ml  Net    122 ml    Exam Awake Alert, Oriented x 3, No new F.N deficits, Normal affect [place].AT,PERRAL Supple Neck,No JVD, No cervical lymphadenopathy appriciated.  Symmetrical Chest wall movement, Good air movement bilaterally, CTAB RRR,No Gallops,Rubs or new Murmurs, No Parasternal Heave +ve B.Sounds, Abd Soft, Non tender, No organomegaly appriciated, No rebound -guarding or rigidity. No Cyanosis, Clubbing or edema, No new Rash or bruise  Data Review   CBC w Diff: Lab Results  Component Value Date   WBC 6.8 07/07/2014   HGB 10.9* 07/07/2014   HCT 35.6* 07/07/2014   PLT 248 07/07/2014   LYMPHOPCT 17 07/04/2014   MONOPCT 13* 07/04/2014   EOSPCT 2 07/04/2014   BASOPCT 0 07/04/2014    CMP: Lab Results  Component Value Date   NA 138 07/07/2014   K 3.9 07/07/2014   CL 103 07/07/2014   CO2 28 07/07/2014   BUN 15 07/07/2014   CREATININE 0.78 07/07/2014   PROT 7.1 07/06/2014   ALBUMIN 3.4* 07/06/2014   BILITOT 0.8 07/06/2014   ALKPHOS 68 07/06/2014   AST 16 07/06/2014   ALT 13 07/06/2014  .   Total Time in preparing paper work, data evaluation and todays exam - 15 minutes  Michaeline Eckersley M.D on 07/08/2014 at 7:14 AM  [facility] Office  [phone]

## 2014-07-08 NOTE — Plan of Care (Signed)
Problem: Phase III Progression Outcomes Goal: Pain controlled on oral analgesia Outcome: Completed/Met Date Met:  07/08/14 Pt c/o back pain not related to this hospitalization

## 2014-07-08 NOTE — Clinical Social Work Placement (Signed)
Clinical Social Work Department CLINICAL SOCIAL WORK PLACEMENT NOTE 07/08/2014  Patient:  Kathy Thomas,Kathy Thomas  Account Number:  [id number] Admit date:  07/04/2014  Clinical Social Worker:  Cori RazorJAMIE HAIDINGER, LCSW  Date/time:  07/05/2014 02:49 PM  Clinical Social Work is seeking post-discharge placement for this patient at the following level of care:   SKILLED NURSING   (*CSW will update this form in Epic as items are completed)   07/05/2014  Patient/family provided with [facility] Department of Clinical Social Work's list of facilities offering this level of care within the geographic area requested by the patient (or if unable, by the patient's family).  07/05/2014  Patient/family informed of their freedom to choose among providers that offer the needed level of care, that participate in Medicare, Medicaid or managed care program needed by the patient, have an available bed and are willing to accept the patient.    Patient/family informed of [facility]' ownership interest in [facility], as well as of the fact that they are under no obligation to receive care at this facility.  PASARR submitted to EDS on 07/05/2014 PASARR number received on 07/05/2014  FL2 transmitted to all facilities in geographic area requested by pt/family on  07/05/2014 FL2 transmitted to all facilities within larger geographic area on   Patient informed that his/her managed care company has contracts with or will negotiate with  certain facilities, including the following:     Patient/family informed of bed offers received:   [facility] Physician recommends and patient chooses bed at    [facility] on  07/08/2014 Patient to be transferred to facility by family Patient and family notified of transfer on 07/08/2014 Name of family member notified:  Asher MuirJamie  The following physician request were entered in Epic:   Additional Comments:  .Elray Bubaegina  Sandra Brents, LCSW [facility] Clinical Social Worker - Weekend Coverage cell #: [phone]

## 2014-07-08 NOTE — Progress Notes (Signed)
[facility] with family [facility]. Attempted to call facility 3x with no answer to give report. All discharge instructions given to pt's daughter. Will continue to call [facility] to attempt to give report.

## 2014-07-09 LAB — GLUCOSE, CAPILLARY: Glucose-Capillary: 114 mg/dL — ABNORMAL HIGH (ref 70–99)

## 2014-07-10 ENCOUNTER — Non-Acute Institutional Stay (SKILLED_NURSING_FACILITY): Payer: Medicare Other | Admitting: Adult Health

## 2014-07-10 ENCOUNTER — Telehealth: Payer: Self-pay | Admitting: *Deleted

## 2014-07-10 DIAGNOSIS — S82402D Unspecified fracture of shaft of left fibula, subsequent encounter for closed fracture with routine healing: Secondary | ICD-10-CM

## 2014-07-10 DIAGNOSIS — F0390 Unspecified dementia without behavioral disturbance: Secondary | ICD-10-CM

## 2014-07-10 DIAGNOSIS — E785 Hyperlipidemia, unspecified: Secondary | ICD-10-CM

## 2014-07-10 DIAGNOSIS — E538 Deficiency of other specified B group vitamins: Secondary | ICD-10-CM

## 2014-07-10 DIAGNOSIS — E119 Type 2 diabetes mellitus without complications: Secondary | ICD-10-CM

## 2014-07-10 DIAGNOSIS — I10 Essential (primary) hypertension: Secondary | ICD-10-CM

## 2014-07-10 DIAGNOSIS — I5022 Chronic systolic (congestive) heart failure: Secondary | ICD-10-CM

## 2014-07-10 DIAGNOSIS — N390 Urinary tract infection, site not specified: Secondary | ICD-10-CM

## 2014-07-10 NOTE — Telephone Encounter (Signed)
Transition Care Management Follow-up Telephone Call D/C 07/08/14  How have you been since you were released from the hospital? Called pt spoke with daughter she stated that mom was not sent home. [facility], and she wasn't sure how long she was going to be in there. Did not make tcm appt...Raechel Chute/lmb

## 2014-07-13 ENCOUNTER — Non-Acute Institutional Stay (SKILLED_NURSING_FACILITY): Payer: Medicare Other | Admitting: Internal Medicine

## 2014-07-13 ENCOUNTER — Ambulatory Visit: Payer: Medicare Other | Admitting: Internal Medicine

## 2014-07-13 DIAGNOSIS — E785 Hyperlipidemia, unspecified: Secondary | ICD-10-CM | POA: Diagnosis not present

## 2014-07-13 DIAGNOSIS — E119 Type 2 diabetes mellitus without complications: Secondary | ICD-10-CM | POA: Diagnosis not present

## 2014-07-13 DIAGNOSIS — S82402S Unspecified fracture of shaft of left fibula, sequela: Secondary | ICD-10-CM

## 2014-07-13 DIAGNOSIS — F0390 Unspecified dementia without behavioral disturbance: Secondary | ICD-10-CM

## 2014-07-13 DIAGNOSIS — E538 Deficiency of other specified B group vitamins: Secondary | ICD-10-CM | POA: Diagnosis not present

## 2014-07-13 DIAGNOSIS — I10 Essential (primary) hypertension: Secondary | ICD-10-CM

## 2014-07-13 NOTE — Progress Notes (Signed)
Patient ID: Kathy Thomas, female   DOB: 1934-06-07, 80 y.o.   MRN: [id number]     [facility]   PCP: Oliver Barre, MD  Code Status: full code  Allergies  Allergen Reactions  . Codeine Hives  . Morphine     REACTION: pt unsure of reaction  . Penicillins     REACTION: rash    Chief Complaint  Patient presents with  . New Admit To SNF     HPI:  80 year old patient is here for short term rehabilitation post hospital admission from 07/04/14-07/08/14 with left ankle sprain and left closed fibular fracture, UTI. She was treated with antibiotics and knee immobilizer was placed. She has past medical history of anxiety, cholelithiasis, diabetes mellitus 2, diverticulosis, hyperlipidemia and hypertension.  She is seen in her room today. Her pain is under control. She is using a walker with therapy team. Denies any concerns this visit. No concern from staff.  Review of Systems:  Constitutional: Negative for fever, chills, malaise/fatigue and diaphoresis.  HENT: Negative for headache, congestion Eyes: Negative for eye pain, blurred vision, double vision and discharge.  Respiratory: Negative for cough, shortness of breath and wheezing.   Cardiovascular: Negative for chest pain, palpitations, leg swelling.  Gastrointestinal: Negative for heartburn, nausea, vomiting, abdominal pain Genitourinary: Negative for dysuria and flank pain.  Musculoskeletal: Negative for back pain, falls in facility Skin: Negative for itching, rash  Neurological: Negative for weakness,dizziness, tingling, focal weakness Psychiatric/Behavioral: Negative for depression    Past Medical History  Diagnosis Date  . Acute sinusitis, unspecified 05/28/2008  . ANEMIA-IRON DEFICIENCY 10/26/2008  . ANXIETY 10/06/2007  . CHOLELITHIASIS 10/06/2007  . COLONIC POLYPS, HX OF 01/26/2007  . DIABETES MELLITUS, TYPE II 10/06/2007  . DIVERTICULOSIS, COLON 01/26/2007  . ECCHYMOSES, SPONTANEOUS 06/04/2010    . GERD 01/26/2007  . HERNIATED DISC 01/26/2007  . HYPERLIPIDEMIA 10/06/2007  . HYPERTENSION 01/26/2007  . LOW BACK PAIN 01/29/2007  . MENOPAUSAL DISORDER 10/26/2008  . OSTEOPOROSIS 06/04/2010  . SHOULDER PAIN, LEFT 10/06/2007   Past Surgical History  Procedure Laterality Date  . Appendectomy    . Abdominal hysterectomy    . Tonsillectomy    . Left elbow-nerve impingement  1985  . Left ankle-screw placement  1980  . Herniated disk   1995    C-spine  . Fracture left  wrist  2010    Dr. Amanda Pea   Social History:   reports that she quit smoking about 7 weeks ago. Her smoking use included Cigarettes. She smoked 0.00 packs per day. She does not have any smokeless tobacco history on file. She reports that she does not drink alcohol or use illicit drugs.  Family History  Problem Relation Age of Onset  . Heart disease Father   . Hyperlipidemia Other   . Stroke Other   . Hypertension Other     Medications: Medication reviewed. See MAR   Physical Exam: Filed Vitals:   07/13/14 1114  BP: 152/69  Pulse: 82  Temp: 97.4 F (36.3 C)  Resp: 18  SpO2: 97%    General- elderly female, in no acute distress Head- normocephalic, atraumatic Throat- moist mucus membrane Neck- no cervical lymphadenopathy Cardiovascular- normal s1,s2, no murmurs, palpable dorsalis pedis, no leg edema Respiratory- bilateral clear to auscultation, no wheeze, no rhonchi, no crackles, no use of accessory muscles Abdomen- bowel sounds present, soft, non tender Musculoskeletal- able to move all 4 extremities, knee immobilizer in place Neurological- no focal deficit Skin-  warm and dry Psychiatry- alert and oriented, normal mood and affect    Labs reviewed: Basic Metabolic Panel:  Recent Labs  98/03/9101/11/16 0525 07/06/14 0542 07/07/14 0500  NA 140 137 138  K 3.6 4.1 3.9  CL 101 101 103  CO2 30 28 28   GLUCOSE 138* 152* 129*  BUN 24* 17 15  CREATININE 0.91 0.71 0.78  CALCIUM 8.3* 8.6 8.6  MG 1.7 1.7  --   PHOS  3.4 3.5  --    Liver Function Tests:  Recent Labs  01/10/14 1119 07/05/14 0525 07/06/14 0542  AST 20 15 16   ALT 16 11 13   ALKPHOS 91 65 68  BILITOT 0.6 0.7 0.8  PROT 7.5 6.5 7.1  ALBUMIN 3.7 3.3* 3.4*   No results for input(s): LIPASE, AMYLASE in the last 8760 hours. No results for input(s): AMMONIA in the last 8760 hours. CBC:  Recent Labs  01/10/14 1119 07/04/14 2050  07/05/14 0525 07/06/14 0542 07/07/14 0500  WBC 8.9 9.7  --  8.6 7.5 6.8  NEUTROABS 6.1 6.5  --   --   --   --   HGB 12.9 12.1  < > 10.8* 11.7* 10.9*  HCT 39.8 39.2  < > 34.9* 37.9 35.6*  MCV 86.8 88.9  --  88.6 88.6 88.3  PLT 264.0 240  --  253 265 248  < > = values in this interval not displayed. Cardiac Enzymes:  Recent Labs  07/05/14 0800  CKTOTAL 52  CKMB 1.3  TROPONINI <0.03   BNP: Invalid input(s): POCBNP CBG:  Recent Labs  07/07/14 1755 07/07/14 2246 07/08/14 0820  GLUCAP 202* 95 114*    Assessment/Plan  Left nondisplaced fibular fracture continue knee immobilizer on the left knee. Has f/u with orthopedics. Will have patient work with PT/OT as tolerated to regain strength and restore function.  Fall precautions are in place. WBAT. Continue Norco 5/325 1 tab q6h prn pain and lovenox for dvt prophylaxis  UTI  Completed Levaquin course, monitor clinically, encouraged hydration  Dementia Stable, continue aricept  Hyperlipidemia  continue atorvastatin 40 mg daily  Hypertension Elevated SBP, continue losartan 100-12.5 mg daily for now and monitor bp  Diabetes mellitus, type II continue metformin 1000 mg daily  B12 deficiency continue vitamin B12 1000 mcg daily   Goals of care: short term rehabilitation   Labs/tests ordered: none  Family/ staff Communication: reviewed care plan with patient and nursing supervisor    Oneal GroutMAHIMA Dolorez Jeffrey, MD  [facility] [phone] (Monday-Friday 8 am - 5 pm) [phone] (afterhours)

## 2014-07-14 ENCOUNTER — Encounter: Payer: Self-pay | Admitting: Gastroenterology

## 2014-07-16 ENCOUNTER — Telehealth: Payer: Self-pay | Admitting: Internal Medicine

## 2014-07-16 NOTE — Telephone Encounter (Signed)
States patient is at [facility] [phone]).  [facility] is not aware that patient does not have dementia.  Daughter is requesting notes to be sent over to [facility]. She is also wanting a call back in regards today. I did tell daughter that Dr. Jonny RuizJohn and his assistant is out of the office.  She still is requesting a call back today.

## 2014-07-17 NOTE — Telephone Encounter (Signed)
Any request for records shjould be directed to medical records, thanks

## 2014-07-17 NOTE — Telephone Encounter (Signed)
Called pt's daughter and gave her the number to medical records for her mother's records.

## 2014-07-19 ENCOUNTER — Encounter: Payer: Self-pay | Admitting: Adult Health

## 2014-07-19 NOTE — Progress Notes (Signed)
Patient ID: JOURDIN GENS, female   DOB: 03-31-1935, 80 y.o.   MRN: [id number]   07/10/14  Facility:  Nursing Home Location:  [facility] Room Number: 908-P LEVEL OF CARE:  SNF (31)   Chief Complaint  Patient presents with  . Hospitalization Follow-up    Left fibular fracture, hyperlipidemia, B12 deficiency, dementia, CHF, hypertension and diabetes mellitus    HISTORY OF PRESENT ILLNESS:  This is a 81 year old female has been admitted to [facility] on 07/08/14 from [facility]. She has past medical history of acute sinusitis, anxiety, cholelithiasis, diabetes mellitus,2, diverticulosis, hyperlipidemia and hypertension. She has been having recurrent falls and CT of the leg showed acute post-thrombotic nondisplaced fracture of the proximal left fibular metaphysis. She was also treated for UTI with Levaquin. She has been admitted for a short-term rehabilitation.  PAST MEDICAL HISTORY:  Past Medical History  Diagnosis Date  . Acute sinusitis, unspecified 05/28/2008  . ANEMIA-IRON DEFICIENCY 10/26/2008  . ANXIETY 10/06/2007  . CHOLELITHIASIS 10/06/2007  . COLONIC POLYPS, HX OF 01/26/2007  . DIABETES MELLITUS, TYPE II 10/06/2007  . DIVERTICULOSIS, COLON 01/26/2007  . ECCHYMOSES, SPONTANEOUS 06/04/2010  . GERD 01/26/2007  . HERNIATED DISC 01/26/2007  . HYPERLIPIDEMIA 10/06/2007  . HYPERTENSION 01/26/2007  . LOW BACK PAIN 01/29/2007  . MENOPAUSAL DISORDER 10/26/2008  . OSTEOPOROSIS 06/04/2010  . SHOULDER PAIN, LEFT 10/06/2007    CURRENT MEDICATIONS: Reviewed per MAR/see medication list  Allergies  Allergen Reactions  . Codeine Hives  . Morphine     REACTION: pt unsure of reaction  . Penicillins     REACTION: rash     REVIEW OF SYSTEMS:  GENERAL: no change in appetite, no fatigue, no weight changes, no fever, chills or weakness RESPIRATORY: no cough, SOB, DOE, wheezing, hemoptysis CARDIAC: no chest pain, edema or palpitations GI: no abdominal pain,  diarrhea, constipation, heart burn, nausea or vomiting  PHYSICAL EXAMINATION  GENERAL: no acute distress EYES: conjunctivae normal, sclerae normal, normal eye lids NECK: supple, trachea midline, no neck masses, no thyroid tenderness, no thyromegaly LYMPHATICS: no LAN in the neck, no supraclavicular LAN RESPIRATORY: breathing is even & unlabored, BS CTAB CARDIAC: RRR, no murmur,no extra heart sounds, no edema GI: abdomen soft, normal BS, no masses, no tenderness, no hepatomegaly, no splenomegaly EXTREMITIES:  Able to hold 4 extremities PSYCHIATRIC: the patient is alert & oriented to person, affect & behavior appropriate  LABS/RADIOLOGY: Labs reviewed: Basic Metabolic Panel:  Recent Labs  40/98/11 0525 07/06/14 0542 07/07/14 0500  NA 140 137 138  K 3.6 4.1 3.9  CL 101 101 103  CO2 GLUCOSE 138* 152* 129*  BUN 24* 17 15  CREATININE 0.91 0.71 0.78  CALCIUM 8.3* 8.6 8.6  MG 1.7 1.7  --   PHOS 3.4 3.5  --    Liver Function Tests:  Recent Labs  01/10/14 1119 07/05/14 0525 07/06/14 0542  AST ALT ALKPHOS 91 65 68  BILITOT 0.6 0.7 0.8  PROT 7.5 6.5 7.1  ALBUMIN 3.7 3.3* 3.4*    CBC:  Recent Labs  01/10/14 1119 07/04/14 2050  07/05/14 0525 07/06/14 0542 07/07/14 0500  WBC 8.9 9.7  --  8.6 7.5 6.8  NEUTROABS 6.1 6.5  --   --   --   --   HGB 12.9 12.1  < > 10.8* 11.7* 10.9*  HCT 39.8 39.2  < > 34.9* 37.9 35.6*  MCV 86.8  88.9  --  88.6 88.6 88.3  PLT 264.0 240  --  253 265 248  < > = values in this interval not displayed.  Lipid Panel:  Recent Labs  01/10/14 1119 07/07/14 0500  HDL 46.70 30*   Cardiac Enzymes:  Recent Labs  07/05/14 0800  CKTOTAL 52  CKMB 1.3  TROPONINI <0.03   CBG:  Recent Labs  07/07/14 1755 07/07/14 2246 07/08/14 0820  GLUCAP 202* 95 114*    Dg Ankle Complete Left  07/04/2014   CLINICAL DATA:  Patient fell on Sunday and went to [facility] on Monday. Diagnosed as a sprain.  Swelling and bruising have increased since then. Increased pain now radiating up to the left knee.  EXAM: LEFT ANKLE COMPLETE - 3+ VIEW  COMPARISON:  None.  FINDINGS: Old postoperative changes with screw and wire fixation of the distal fibula. Diffuse bone demineralization. Degenerative changes in the left ankle and intertarsal joints. Plantar and Achilles spurs of the calcaneus. Mild diffuse soft tissue swelling about the left ankle. No acute fracture or dislocation is appreciated. Vascular calcifications.  IMPRESSION: Diffuse degenerative change and demineralization in the left ankle. Soft tissue swelling. No acute fractures identified.   Electronically Signed   By: Burman NievesWilliam  Stevens M.D.   On: 07/04/2014 20:30   Ct Head Wo Contrast  07/05/2014   CLINICAL DATA:  Fall 4 days ago.  Dementia.  EXAM: CT HEAD WITHOUT CONTRAST  TECHNIQUE: Contiguous axial images were obtained from the base of the skull through the vertex without intravenous contrast.  COMPARISON:  Brain MRI on 10/03/2013  FINDINGS: There is no evidence of intracranial hemorrhage, brain edema, or other signs of acute infarction. There is no evidence of intracranial mass lesion or mass effect. No abnormal extraaxial fluid collections are identified.  Mild chronic small vessel disease is noted. Ventricles are normal in size.  IMPRESSION: No acute intracranial findings.  Mild chronic small vessel disease.   Electronically Signed   By: Myles RosenthalJohn  Stahl M.D.   On: 07/05/2014 09:06   Ct Tibia Fibula Left Wo Contrast  07/04/2014   CLINICAL DATA:  Nondisplaced fractures of the proximal fibula demonstrated on plain film radiographs. History of a fall.  EXAM: CT TIBIA FIBULA LEFT WITHOUT CONTRAST  TECHNIQUE: Multidetector CT imaging was performed according to the standard protocol. Multiplanar CT image reconstructions were also generated.  COMPARISON:  Left knee 07/04/2014  FINDINGS: Acute nondisplaced fracture of the proximal left fibular metaphysis. No fractures  demonstrated in the distal femur or of the tibia. Postoperative changes demonstrated in the distal fibula with screw and wire fixation. Degenerative changes in the knee with narrowed medial and lateral compartments and associated hypertrophic changes. Diffuse bone demineralization suggest osteoporosis. Vascular calcifications. No significant effusion at the knee.  IMPRESSION: Acute posttraumatic nondisplaced fracture of the proximal left fibular metaphysis. No additional fractures identified. Diffuse bone demineralization suggest underlying osteoporosis.   Electronically Signed   By: Burman NievesWilliam  Stevens M.D.   On: 07/04/2014 22:54   Dg Chest Port 1 View  07/05/2014   CLINICAL DATA:  80 year old female with recent fall. Low grade fever. Fibula fracture. Initial encounter.  EXAM: PORTABLE CHEST - 1 VIEW  COMPARISON:  None.  FINDINGS: Portable AP upright view at 0747 hours. Normal cardiac size and mediastinal contours. Visualized tracheal air column is within normal limits. Lung volumes within normal limits allowing for portable technique. Mild increased interstitial markings diffusely. Otherwise allowing for portable technique, the lungs are clear. No pneumothorax or pleural  effusion.  IMPRESSION: Pulmonary vascular congestion without overt edema. Otherwise no acute cardiopulmonary abnormality.   Electronically Signed   By: Odessa Fleming M.D.   On: 07/05/2014 08:10   Dg Knee Complete 4 Views Left  07/04/2014   CLINICAL DATA:  Fall on Sunday.  Ankle pain on bruising and swelling  EXAM: LEFT KNEE - COMPLETE 4+ VIEW  COMPARISON:  None.  FINDINGS: No fracture of the proximal tibia or distal femur. Patella is normal. No joint effusion. There is a subtle lucency at the metadiaphysis of the proximal fibula concerning for nondisplaced fracture.  IMPRESSION: Probable nondisplaced fracture of the proximal fibula. No evidence of fracture at the knee joint.   Electronically Signed   By: Genevive Bi M.D.   On: 07/04/2014 20:28      ASSESSMENT/PLAN:  Left nondisplaced fibular fracture - for rehabilitation; continue knee immobilizer on the left knee; weightbearing as tolerated; continue Lovenox 40 mg subcutaneous daily for DVT prophylaxis; and Norco 5/325 mg 1 tab by mouth every 6 hours when necessary for pain UTI - continue Levaquin 4 days Hyperlipidemia - continue atorvastatin 40 mg by mouth daily B12 deficiency - continue vitamin B12 1000 g by mouth daily Dementia - continue Aricept 10 mg by mouth every at bedtime Chronic systolic CHF - stable; follow-up with Dr. Elease Hashimoto, cardiology Hypertension - well controlled; continue losartan 100-12 0.5 mg by mouth daily Diabetes mellitus, type II - continue metformin 500 mg 2 tabs by mouth daily   Goals of care:  Short-term rehabilitation    Labs/test ordered:  None   Spent 50 minutes in patient care.     [facility], NP [employer] [phone]

## 2014-07-20 ENCOUNTER — Non-Acute Institutional Stay (SKILLED_NURSING_FACILITY): Payer: Medicare Other | Admitting: Adult Health

## 2014-07-20 ENCOUNTER — Encounter: Payer: Self-pay | Admitting: Adult Health

## 2014-07-20 DIAGNOSIS — F0390 Unspecified dementia without behavioral disturbance: Secondary | ICD-10-CM

## 2014-07-20 DIAGNOSIS — E119 Type 2 diabetes mellitus without complications: Secondary | ICD-10-CM

## 2014-07-20 DIAGNOSIS — E785 Hyperlipidemia, unspecified: Secondary | ICD-10-CM

## 2014-07-20 DIAGNOSIS — S82402D Unspecified fracture of shaft of left fibula, subsequent encounter for closed fracture with routine healing: Secondary | ICD-10-CM

## 2014-07-20 DIAGNOSIS — I5022 Chronic systolic (congestive) heart failure: Secondary | ICD-10-CM

## 2014-07-20 DIAGNOSIS — I10 Essential (primary) hypertension: Secondary | ICD-10-CM

## 2014-07-20 DIAGNOSIS — E538 Deficiency of other specified B group vitamins: Secondary | ICD-10-CM

## 2014-07-20 NOTE — Progress Notes (Signed)
Patient ID: Kathy Thomas, female   DOB: [birth year], 80 y.o.   MRN: [id number]   07/20/14  Facility:  Nursing Home Location:  [facility] Room Number: 908-P LEVEL OF CARE:  SNF (31)   Chief Complaint  Patient presents with  . Discharge Note    Left fibular fracture, hyperlipidemia, B12 deficiency, dementia, CHF, hypertension and diabetes mellitus    HISTORY OF PRESENT ILLNESS:  This is a 80 year old female is for discharge home with home health PT, OT and speech therapy.  DME: Bedside commode and rolling walker. She has been admitted to [facility] on 07/08/14 from [facility]. She has past medical history of acute sinusitis, anxiety, cholelithiasis, diabetes mellitus,2, diverticulosis, hyperlipidemia and hypertension. She has been having recurrent falls and CT of the leg showed acute post-thrombotic nondisplaced fracture of the proximal left fibular metaphysis. She was also treated for UTI with Levaquin.   Patient was admitted to this facility for short-term rehabilitation after the patient's recent hospitalization.  Patient has completed SNF rehabilitation and therapy has cleared the patient for discharge.  PAST MEDICAL HISTORY:  Past Medical History  Diagnosis Date  . Acute sinusitis, unspecified 05/28/2008  . ANEMIA-IRON DEFICIENCY 10/26/2008  . ANXIETY 10/06/2007  . CHOLELITHIASIS 10/06/2007  . COLONIC POLYPS, HX OF 01/26/2007  . DIABETES MELLITUS, TYPE II 10/06/2007  . DIVERTICULOSIS, COLON 01/26/2007  . ECCHYMOSES, SPONTANEOUS 06/04/2010  . GERD 01/26/2007  . HERNIATED DISC 01/26/2007  . HYPERLIPIDEMIA 10/06/2007  . HYPERTENSION 01/26/2007  . LOW BACK PAIN 01/29/2007  . MENOPAUSAL DISORDER 10/26/2008  . OSTEOPOROSIS 06/04/2010  . SHOULDER PAIN, LEFT 10/06/2007    CURRENT MEDICATIONS: Reviewed per MAR/see medication list  Allergies  Allergen Reactions  . Codeine Hives  . Morphine     REACTION: pt unsure of reaction  . Penicillins     REACTION:  rash     REVIEW OF SYSTEMS:  GENERAL: no change in appetite, no fatigue, no weight changes, no fever, chills or weakness RESPIRATORY: no cough, SOB, DOE, wheezing, hemoptysis CARDIAC: no chest pain, edema or palpitations GI: no abdominal pain, diarrhea, constipation, heart burn, nausea or vomiting  PHYSICAL EXAMINATION  GENERAL: no acute distress NECK: supple, trachea midline, no neck masses, no thyroid tenderness, no thyromegaly LYMPHATICS: no LAN in the neck, no supraclavicular LAN RESPIRATORY: breathing is even & unlabored, BS CTAB CARDIAC: RRR, no murmur,no extra heart sounds, no edema GI: abdomen soft, normal BS, no masses, no tenderness, no hepatomegaly, no splenomegaly EXTREMITIES:  Able to hold 4 extremities PSYCHIATRIC: the patient is alert & oriented to person, affect & behavior appropriate  LABS/RADIOLOGY: 07/09/14  WBC 7.4 hemoglobin 11.0 hematocrit 34.4 MCV 85.4 sodium 139 potassium 4.3 glucose 109 BUN 15 creatinine 0.76 calcium 8.6 Labs reviewed: Basic Metabolic Panel:  Recent Labs  52/84/13 0525 07/06/14 0542 07/07/14 0500  NA 140 137 138  K 3.6 4.1 3.9  CL 101 101 103  CO2 GLUCOSE 138* 152* 129*  BUN 24* 17 15  CREATININE 0.91 0.71 0.78  CALCIUM 8.3* 8.6 8.6  MG 1.7 1.7  --   PHOS 3.4 3.5  --    Liver Function Tests:  Recent Labs  01/10/14 1119 07/05/14 0525 07/06/14 0542  AST ALT ALKPHOS 91 65 68  BILITOT 0.6 0.7 0.8  PROT 7.5 6.5 7.1  ALBUMIN 3.7 3.3* 3.4*    CBC:  Recent Labs  01/10/14 1119 07/04/14 2050  07/05/14 0525 07/06/14 0542 07/07/14 0500  WBC 8.9 9.7  --  8.6 7.5 6.8  NEUTROABS 6.1 6.5  --   --   --   --   HGB 12.9 12.1  < > 10.8* 11.7* 10.9*  HCT 39.8 39.2  < > 34.9* 37.9 35.6*  MCV 86.8 88.9  --  88.6 88.6 88.3  PLT 264.0 240  --  253 265 248  < > = values in this interval not displayed.  Lipid Panel:  Recent Labs  01/10/14 1119 07/07/14 0500  HDL 46.70 30*   Cardiac  Enzymes:  Recent Labs  07/05/14 0800  CKTOTAL 52  CKMB 1.3  TROPONINI <0.03   CBG:  Recent Labs  07/07/14 1755 07/07/14 2246 07/08/14 0820  GLUCAP 202* 95 114*    Dg Ankle Complete Left  07/04/2014   CLINICAL DATA:  [facility] fell on Sunday [facility]. Diagnosed as a sprain. Swelling and bruising have increased since then. Increased pain now radiating up to the left knee.  EXAM: LEFT ANKLE COMPLETE - 3+ VIEW  COMPARISON:  None.  FINDINGS: Old postoperative changes with screw and wire fixation of the distal fibula. Diffuse bone demineralization. Degenerative changes in the left ankle and intertarsal joints. Plantar and Achilles spurs of the calcaneus. Mild diffuse soft tissue swelling about the left ankle. No acute fracture or dislocation is appreciated. Vascular calcifications.  IMPRESSION: Diffuse degenerative change and demineralization in the left ankle. Soft tissue swelling. No acute fractures identified.   Electronically Signed   By: Burman NievesWilliam  Stevens M.D.   On: 07/04/2014 20:30   Ct Head Wo Contrast  07/05/2014   CLINICAL DATA:  Fall 4 days ago.  Dementia.  EXAM: CT HEAD WITHOUT CONTRAST  TECHNIQUE: Contiguous axial images were obtained from the base of the skull through the vertex without intravenous contrast.  COMPARISON:  Brain MRI on 10/03/2013  FINDINGS: There is no evidence of intracranial hemorrhage, brain edema, or other signs of acute infarction. There is no evidence of intracranial mass lesion or mass effect. No abnormal extraaxial fluid collections are identified.  Mild chronic small vessel disease is noted. Ventricles are normal in size.  IMPRESSION: No acute intracranial findings.  Mild chronic small vessel disease.   Electronically Signed   By: Myles RosenthalJohn  Stahl M.D.   On: 07/05/2014 09:06   Ct Tibia Fibula Left Wo Contrast  07/04/2014   CLINICAL DATA:  Nondisplaced fractures of the proximal fibula demonstrated on plain film radiographs. History  of a fall.  EXAM: CT TIBIA FIBULA LEFT WITHOUT CONTRAST  TECHNIQUE: Multidetector CT imaging was performed according to the standard protocol. Multiplanar CT image reconstructions were also generated.  COMPARISON:  Left knee 07/04/2014  FINDINGS: Acute nondisplaced fracture of the proximal left fibular metaphysis. No fractures demonstrated in the distal femur or of the tibia. Postoperative changes demonstrated in the distal fibula with screw and wire fixation. Degenerative changes in the knee with narrowed medial and lateral compartments and associated hypertrophic changes. Diffuse bone demineralization suggest osteoporosis. Vascular calcifications. No significant effusion at the knee.  IMPRESSION: Acute posttraumatic nondisplaced fracture of the proximal left fibular metaphysis. No additional fractures identified. Diffuse bone demineralization suggest underlying osteoporosis.   Electronically Signed   By: Burman NievesWilliam  Stevens M.D.   On: 07/04/2014 22:54   Dg Chest Port 1 View  07/05/2014   CLINICAL DATA:  80 year old female with recent fall. Low grade fever. Fibula fracture. Initial encounter.  EXAM: PORTABLE CHEST - 1  VIEW  COMPARISON:  None.  FINDINGS: Portable AP upright view at 0747 hours. Normal cardiac size and mediastinal contours. Visualized tracheal air column is within normal limits. Lung volumes within normal limits allowing for portable technique. Mild increased interstitial markings diffusely. Otherwise allowing for portable technique, the lungs are clear. No pneumothorax or pleural effusion.  IMPRESSION: Pulmonary vascular congestion without overt edema. Otherwise no acute cardiopulmonary abnormality.   Electronically Signed   By: Odessa Fleming M.D.   On: 07/05/2014 08:10   Dg Knee Complete 4 Views Left  07/04/2014   CLINICAL DATA:  Fall on Sunday.  Ankle pain on bruising and swelling  EXAM: LEFT KNEE - COMPLETE 4+ VIEW  COMPARISON:  None.  FINDINGS: No fracture of the proximal tibia or distal femur.  Patella is normal. No joint effusion. There is a subtle lucency at the metadiaphysis of the proximal fibula concerning for nondisplaced fracture.  IMPRESSION: Probable nondisplaced fracture of the proximal fibula. No evidence of fracture at the knee joint.   Electronically Signed   By: Genevive Bi M.D.   On: 07/04/2014 20:28    ASSESSMENT/PLAN:  Left nondisplaced fibular fracture - for home health PT, OT and ST; continue knee immobilizer on the left knee; weightbearing as tolerated; continue Lovenox 40 mg subcutaneous daily for DVT prophylaxis; and Norco 5/325 mg 1 tab by mouth every 6 hours when necessary for pain UTI - Resolved Hyperlipidemia - continue atorvastatin 40 mg by mouth daily B12 deficiency - continue vitamin B12 1000 g by mouth daily Dementia - continue Aricept 10 mg by mouth every at bedtime Chronic systolic CHF - stable; follow-up with Dr. Elease Hashimoto, cardiology Hypertension - well controlled; continue losartan 100-12 0.5 mg by mouth daily Diabetes mellitus, type II - continue metformin 500 mg 2 tabs by mouth daily    I have filled out patient's discharge paperwork and written prescriptions.  Patient will receive home health PT, OT and ST.  DME provided:  Bedside commode and rolling walker  Total discharge time: Greater than 30 minutes  Discharge time involved coordination of the discharge process with social worker, nursing staff and therapy department. Medical justification for home health services/DME verified.     [facility], NP [employer] [phone]

## 2014-07-23 DIAGNOSIS — I5022 Chronic systolic (congestive) heart failure: Secondary | ICD-10-CM | POA: Diagnosis not present

## 2014-07-23 DIAGNOSIS — M810 Age-related osteoporosis without current pathological fracture: Secondary | ICD-10-CM | POA: Diagnosis not present

## 2014-07-23 DIAGNOSIS — S82455D Nondisplaced comminuted fracture of shaft of left fibula, subsequent encounter for closed fracture with routine healing: Secondary | ICD-10-CM | POA: Diagnosis not present

## 2014-07-23 DIAGNOSIS — E119 Type 2 diabetes mellitus without complications: Secondary | ICD-10-CM | POA: Diagnosis not present

## 2014-09-10 ENCOUNTER — Other Ambulatory Visit: Payer: Self-pay | Admitting: Adult Health

## 2014-09-13 ENCOUNTER — Ambulatory Visit (INDEPENDENT_AMBULATORY_CARE_PROVIDER_SITE_OTHER): Payer: Medicare Other | Admitting: Internal Medicine

## 2014-09-13 ENCOUNTER — Encounter: Payer: Self-pay | Admitting: Internal Medicine

## 2014-09-13 VITALS — BP 124/80 | HR 63 | Temp 98.5°F | Resp 18 | Ht 62.0 in | Wt 196.1 lb

## 2014-09-13 DIAGNOSIS — E119 Type 2 diabetes mellitus without complications: Secondary | ICD-10-CM | POA: Diagnosis not present

## 2014-09-13 DIAGNOSIS — E785 Hyperlipidemia, unspecified: Secondary | ICD-10-CM

## 2014-09-13 DIAGNOSIS — I10 Essential (primary) hypertension: Secondary | ICD-10-CM

## 2014-09-13 DIAGNOSIS — Z0189 Encounter for other specified special examinations: Secondary | ICD-10-CM | POA: Diagnosis not present

## 2014-09-13 DIAGNOSIS — Z0000 Encounter for general adult medical examination without abnormal findings: Secondary | ICD-10-CM

## 2014-09-13 MED ORDER — LOSARTAN POTASSIUM-HCTZ 100-12.5 MG PO TABS: 1.0000 | ORAL_TABLET | Freq: Every day | ORAL | Status: DC

## 2014-09-13 MED ORDER — METFORMIN HCL ER 500 MG PO TB24: ORAL_TABLET | ORAL | Status: DC

## 2014-09-13 MED ORDER — ATORVASTATIN CALCIUM 40 MG PO TABS: 40.0000 mg | ORAL_TABLET | Freq: Every day | ORAL | Status: DC

## 2014-09-13 NOTE — Progress Notes (Signed)
Subjective:    Patient ID: Kathy Thomas, female    DOB: 1934-09-12, 80 y.o.   MRN: [id number]  HPI  Here to f/u; overall doing ok,  Pt denies chest pain, increasing sob or doe, wheezing, orthopnea, PND, increased LE swelling, palpitations, dizziness or syncope.  Pt denies new neurological symptoms such as new headache, or facial or extremity weakness or numbness.  Pt denies polydipsia, polyuria, or low sugar episode.   Pt denies new neurological symptoms such as new headache, or facial or extremity weakness or numbness.   Pt states overall good compliance with meds, mostly trying to follow appropriate diet, with wt overall stable,  but little exercise however. Quit smoking completely Past Medical History  Diagnosis Date  . Acute sinusitis, unspecified 05/28/2008  . ANEMIA-IRON DEFICIENCY 10/26/2008  . ANXIETY 10/06/2007  . CHOLELITHIASIS 10/06/2007  . COLONIC POLYPS, HX OF 01/26/2007  . DIABETES MELLITUS, TYPE II 10/06/2007  . DIVERTICULOSIS, COLON 01/26/2007  . ECCHYMOSES, SPONTANEOUS 06/04/2010  . GERD 01/26/2007  . HERNIATED DISC 01/26/2007  . HYPERLIPIDEMIA 10/06/2007  . HYPERTENSION 01/26/2007  . LOW BACK PAIN 01/29/2007  . MENOPAUSAL DISORDER 10/26/2008  . OSTEOPOROSIS 06/04/2010  . SHOULDER PAIN, LEFT 10/06/2007   Past Surgical History  Procedure Laterality Date  . Appendectomy    . Abdominal hysterectomy    . Tonsillectomy    . Left elbow-nerve impingement  1985  . Left ankle-screw placement  1980  . Herniated disk   1995    C-spine  . Fracture left  wrist  2010    Dr. Amanda PeaGramig    reports that she quit smoking about 2 months ago. Her smoking use included Cigarettes. She smoked 0.00 packs per day. She does not have any smokeless tobacco history on file. She reports that she does not drink alcohol or use illicit drugs. family history includes Heart disease in her father; Hyperlipidemia in her other; Hypertension in her other; Stroke in her other. Allergies  Allergen Reactions  . Codeine  Hives  . Morphine     REACTION: pt unsure of reaction  . Penicillins     REACTION: rash   Current Outpatient Prescriptions on File Prior to Visit  Medication Sig Dispense Refill  . aspirin EC 81 MG tablet Take 1 tablet (81 mg total) by mouth daily. 90 tablet 11  . Calcium 1200-1000 MG-UNIT CHEW Chew by mouth daily.      Marland Kitchen. donepezil (ARICEPT) 10 MG tablet Take 1 tablet (10 mg total) by mouth at bedtime. 90 tablet 3  . HYDROcodone-acetaminophen (NORCO/VICODIN) 5-325 MG per tablet Take 1 tablet by mouth every 6 (six) hours as needed for moderate pain. 30 tablet 0  . levofloxacin (LEVAQUIN) 500 MG tablet Take 1 tablet (500 mg total) by mouth daily with breakfast. 4 tablet 0  . senna (SENOKOT) 8.6 MG TABS tablet Take 1 tablet (8.6 mg total) by mouth daily as needed for mild constipation. 30 each 0  . vitamin B-12 1000 MCG tablet Take 1 tablet (1,000 mcg total) by mouth daily. 30 tablet 1   No current facility-administered medications on file prior to visit.    Review of Systems  Constitutional: Negative for unusual diaphoresis or night sweats HENT: Negative for ringing in ear or discharge Eyes: Negative for double vision or worsening visual disturbance.  Respiratory: Negative for choking and stridor.   Gastrointestinal: Negative for vomiting or other signifcant bowel change Genitourinary: Negative for hematuria or change in urine volume.  Musculoskeletal: Negative for other MSK  pain or swelling Skin: Negative for color change and worsening wound.  Neurological: Negative for tremors and numbness other than noted  Psychiatric/Behavioral: Negative for decreased concentration or agitation other than above       Objective:   Physical Exam BP 124/80 mmHg  Pulse 63  Temp(Src) 98.5 F (36.9 C) (Oral)  Resp 18  Ht  (1.575 m)  Wt 196 lb 1.9 oz (88.959 kg)  BMI 35.86 kg/m2  SpO2 98% VS noted,  Constitutional: Pt appears in no significant distress HENT: Head: NCAT.  Right Ear:  External ear normal.  Left Ear: External ear normal.  Eyes: . Pupils are equal, round, and reactive to light. Conjunctivae and EOM are normal Neck: Normal range of motion. Neck supple.  Cardiovascular: Normal rate and regular rhythm.   Pulmonary/Chest: Effort normal and breath sounds without rales or wheezing.  Abd:  Soft, NT, ND, + BS Neurological: Pt is alert. Not confused , motor grossly intact Skin: Skin is warm. No rash, no LE edema Psychiatric: Pt behavior is normal. No agitation.     Assessment & Plan:

## 2014-09-13 NOTE — Patient Instructions (Signed)
Please continue all other medications as before, and refills have been done if requested.  Please have the pharmacy call with any other refills you may need.  Please continue your efforts at being more active, low cholesterol diet, and weight control.  You are otherwise up to date with prevention measures today.  Please keep your appointments with your specialists as you may have planned  Please go to the LAB in the Basement (turn left off the elevator) for the tests to be done tomorrow  You will be contacted by phone if any changes need to be made immediately.  Otherwise, you will receive a letter about your results with an explanation, but please check with MyChart first.  Please remember to sign up for MyChart if you have not done so, as this will be important to you in the future with finding out test results, communicating by private email, and scheduling acute appointments online when needed.  Please return in 6 months, or sooner if needed, with Lab testing done 3-5 days before  

## 2014-09-22 NOTE — Assessment & Plan Note (Signed)
stable overall by history and exam, recent data reviewed with pt, and pt to continue medical treatment as before,  to f/u any worsening symptoms or concerns Lab Results  Component Value Date   HGBA1C 6.6* 01/10/2014

## 2014-09-22 NOTE — Assessment & Plan Note (Signed)
stable overall by history and exam, recent data reviewed with pt, and pt to continue medical treatment as before,  to f/u any worsening symptoms or concerns BP Readings from Last 3 Encounters:  09/13/14 124/80  07/20/14 113/64  07/13/14 152/69

## 2014-09-22 NOTE — Assessment & Plan Note (Signed)
stable overall by history and exam, recent data reviewed with pt, and pt to continue medical treatment as before,  to f/u any worsening symptoms or concerns Lab Results  Component Value Date   HGBA1C 6.6* 01/10/2014    

## 2014-09-24 ENCOUNTER — Telehealth: Payer: Self-pay

## 2014-09-25 NOTE — Telephone Encounter (Signed)
Patient called to educate on Medicare Wellness apt. LVM for the patient to call back to educate and schedule for wellness visit.   

## 2014-10-05 NOTE — Telephone Encounter (Signed)
Called the patient at the "dtr" answered and reviewed AWV and stated this was something that was needed; Will try to see when she can bring her mother in. Stated to call the [phone] number and can schedule anytime that is convenient.

## 2014-10-10 NOTE — Telephone Encounter (Signed)
Call back to dtr to fup on scheduling AWV; Stated she has been out of town, but will be back in town this weekend and will talk to her mother and call office to schedule, as she does want her to have the wellness exam.

## 2014-10-19 ENCOUNTER — Other Ambulatory Visit: Payer: Self-pay | Admitting: Internal Medicine

## 2014-11-19 ENCOUNTER — Other Ambulatory Visit: Payer: Self-pay

## 2014-12-06 ENCOUNTER — Other Ambulatory Visit: Payer: Self-pay | Admitting: Internal Medicine

## 2014-12-17 ENCOUNTER — Telehealth: Payer: Self-pay | Admitting: Internal Medicine

## 2014-12-22 ENCOUNTER — Other Ambulatory Visit: Payer: Self-pay | Admitting: Internal Medicine

## 2014-12-24 NOTE — Telephone Encounter (Signed)
Patient states she is out of medication and is requesting script to be sent as soon as possible.

## 2014-12-24 NOTE — Telephone Encounter (Signed)
Called pt spoke with daughter inform refill for Lipitor sent back [facility].../lmb

## 2015-04-05 ENCOUNTER — Ambulatory Visit (INDEPENDENT_AMBULATORY_CARE_PROVIDER_SITE_OTHER): Payer: Medicare Other

## 2015-04-05 DIAGNOSIS — Z23 Encounter for immunization: Secondary | ICD-10-CM

## 2015-07-19 ENCOUNTER — Other Ambulatory Visit: Payer: Self-pay | Admitting: Internal Medicine

## 2015-10-10 ENCOUNTER — Encounter: Payer: Self-pay | Admitting: Gastroenterology

## 2015-10-23 ENCOUNTER — Other Ambulatory Visit: Payer: Self-pay | Admitting: Internal Medicine

## 2015-12-06 ENCOUNTER — Other Ambulatory Visit (INDEPENDENT_AMBULATORY_CARE_PROVIDER_SITE_OTHER): Payer: Medicare Other

## 2015-12-06 ENCOUNTER — Encounter: Payer: Self-pay | Admitting: Internal Medicine

## 2015-12-06 ENCOUNTER — Ambulatory Visit (INDEPENDENT_AMBULATORY_CARE_PROVIDER_SITE_OTHER): Payer: Medicare Other | Admitting: Internal Medicine

## 2015-12-06 VITALS — BP 138/78 | HR 84 | Temp 98.5°F | Resp 20 | Wt 220.0 lb

## 2015-12-06 DIAGNOSIS — I10 Essential (primary) hypertension: Secondary | ICD-10-CM

## 2015-12-06 DIAGNOSIS — R7989 Other specified abnormal findings of blood chemistry: Secondary | ICD-10-CM | POA: Diagnosis not present

## 2015-12-06 DIAGNOSIS — R6889 Other general symptoms and signs: Secondary | ICD-10-CM | POA: Diagnosis not present

## 2015-12-06 DIAGNOSIS — Z0001 Encounter for general adult medical examination with abnormal findings: Secondary | ICD-10-CM

## 2015-12-06 DIAGNOSIS — D509 Iron deficiency anemia, unspecified: Secondary | ICD-10-CM

## 2015-12-06 DIAGNOSIS — E538 Deficiency of other specified B group vitamins: Secondary | ICD-10-CM | POA: Diagnosis not present

## 2015-12-06 DIAGNOSIS — E785 Hyperlipidemia, unspecified: Secondary | ICD-10-CM | POA: Diagnosis not present

## 2015-12-06 DIAGNOSIS — E119 Type 2 diabetes mellitus without complications: Secondary | ICD-10-CM | POA: Diagnosis not present

## 2015-12-06 LAB — MICROALBUMIN / CREATININE URINE RATIO
Creatinine,U: 149.6 mg/dL
MICROALB UR: 0.8 mg/dL (ref 0.0–1.9)
Microalb Creat Ratio: 0.5 mg/g (ref 0.0–30.0)

## 2015-12-06 LAB — URINALYSIS, ROUTINE W REFLEX MICROSCOPIC
BILIRUBIN URINE: NEGATIVE
HGB URINE DIPSTICK: NEGATIVE
LEUKOCYTES UA: NEGATIVE
Nitrite: NEGATIVE
Specific Gravity, Urine: 1.025 (ref 1.000–1.030)
TOTAL PROTEIN, URINE-UPE24: NEGATIVE
UROBILINOGEN UA: 0.2 (ref 0.0–1.0)
pH: 5 (ref 5.0–8.0)

## 2015-12-06 LAB — IBC PANEL
Iron: 24 ug/dL — ABNORMAL LOW (ref 42–145)
SATURATION RATIOS: 5.9 % — AB (ref 20.0–50.0)
TRANSFERRIN: 291 mg/dL (ref 212.0–360.0)

## 2015-12-06 LAB — CBC WITH DIFFERENTIAL/PLATELET
BASOS PCT: 0.4 % (ref 0.0–3.0)
Basophils Absolute: 0 10*3/uL (ref 0.0–0.1)
EOS PCT: 3.9 % (ref 0.0–5.0)
Eosinophils Absolute: 0.3 10*3/uL (ref 0.0–0.7)
HCT: 38.2 % (ref 36.0–46.0)
Hemoglobin: 12.3 g/dL (ref 12.0–15.0)
LYMPHS ABS: 2 10*3/uL (ref 0.7–4.0)
Lymphocytes Relative: 26.2 % (ref 12.0–46.0)
MCHC: 32.1 g/dL (ref 30.0–36.0)
MCV: 82.3 fl (ref 78.0–100.0)
MONOS PCT: 9.7 % (ref 3.0–12.0)
Monocytes Absolute: 0.8 10*3/uL (ref 0.1–1.0)
NEUTROS PCT: 59.8 % (ref 43.0–77.0)
Neutro Abs: 4.6 10*3/uL (ref 1.4–7.7)
Platelets: 265 10*3/uL (ref 150.0–400.0)
RBC: 4.64 Mil/uL (ref 3.87–5.11)
RDW: 15.9 % — ABNORMAL HIGH (ref 11.5–15.5)
WBC: 7.8 10*3/uL (ref 4.0–10.5)

## 2015-12-06 LAB — HEMOGLOBIN A1C: Hgb A1c MFr Bld: 7.8 % — ABNORMAL HIGH (ref 4.6–6.5)

## 2015-12-06 LAB — BASIC METABOLIC PANEL
BUN: 17 mg/dL (ref 6–23)
CALCIUM: 9.4 mg/dL (ref 8.4–10.5)
CO2: 32 mEq/L (ref 19–32)
Chloride: 101 mEq/L (ref 96–112)
Creatinine, Ser: 1.11 mg/dL (ref 0.40–1.20)
GFR: 50.44 mL/min — AB (ref >60.00)
GLUCOSE: 209 mg/dL — AB (ref 70–99)
Potassium: 4.9 mEq/L (ref 3.5–5.1)
SODIUM: 143 meq/L (ref 135–145)

## 2015-12-06 LAB — TSH: TSH: 0.63 u[IU]/mL (ref 0.35–4.50)

## 2015-12-06 LAB — HEPATIC FUNCTION PANEL
ALBUMIN: 3.8 g/dL (ref 3.5–5.2)
ALK PHOS: 88 U/L (ref 39–117)
ALT: 13 U/L (ref 0–35)
AST: 15 U/L (ref 0–37)
BILIRUBIN DIRECT: 0.1 mg/dL (ref 0.0–0.3)
BILIRUBIN TOTAL: 0.4 mg/dL (ref 0.2–1.2)
Total Protein: 7.1 g/dL (ref 6.0–8.3)

## 2015-12-06 LAB — LIPID PANEL
CHOL/HDL RATIO: 3
Cholesterol: 139 mg/dL (ref 0–200)
HDL: 43.5 mg/dL (ref >39.00)
NONHDL: 95.23
Triglycerides: 237 mg/dL — ABNORMAL HIGH (ref 0.0–149.0)
VLDL: 47.4 mg/dL — AB (ref 0.0–40.0)

## 2015-12-06 LAB — VITAMIN B12: Vitamin B-12: >1500 pg/mL — ABNORMAL HIGH (ref 211–911)

## 2015-12-06 LAB — LDL CHOLESTEROL, DIRECT: Direct LDL: 74 mg/dL

## 2015-12-06 NOTE — Patient Instructions (Addendum)

## 2015-12-06 NOTE — Progress Notes (Signed)
Subjective:    Patient ID: Kathy Thomas, female    DOB: 1934-11-19, 81 y.o.   MRN: [id number]  HPI  Here for wellness and f/u;  Overall doing ok;  Pt denies Chest pain, worsening SOB, DOE, wheezing, orthopnea, PND, worsening LE edema, palpitations, dizziness or syncope. . Pt states overall good compliance with treatment and medications, good tolerability, and has been trying to follow appropriate diet.  Pt denies worsening depressive symptoms, suicidal ideation or panic. No fever, night sweats, wt loss, loss of appetite, or other constitutional symptoms.  Pt states good ability with ADL's, has low fall risk, home safety reviewed and adequate, no other significant changes in hearing or vision, and only occasionally active with exercise.   Pt denies polydipsia, polyuria, or low sugar symptoms such as weakness or confusion improved with po intake.  Pt states overall good compliance with meds, trying to follow lower cholesterol, diabetic diet, wt overall stable but little exercise however.   Pt denies new neurological symptoms such as new headache, or facial or extremity weakness or numbness  Has hx of b12 deficiency.  No recent overt bleeding or bruising, has hx of iron deficiency Past Medical History  Diagnosis Date  . Acute sinusitis, unspecified 05/28/2008  . ANEMIA-IRON DEFICIENCY 10/26/2008  . ANXIETY 10/06/2007  . CHOLELITHIASIS 10/06/2007  . COLONIC POLYPS, HX OF 01/26/2007  . DIABETES MELLITUS, TYPE II 10/06/2007  . DIVERTICULOSIS, COLON 01/26/2007  . ECCHYMOSES, SPONTANEOUS 06/04/2010  . GERD 01/26/2007  . HERNIATED DISC 01/26/2007  . HYPERLIPIDEMIA 10/06/2007  . HYPERTENSION 01/26/2007  . LOW BACK PAIN 01/29/2007  . MENOPAUSAL DISORDER 10/26/2008  . OSTEOPOROSIS 06/04/2010  . SHOULDER PAIN, LEFT 10/06/2007   Past Surgical History  Procedure Laterality Date  . Appendectomy    . Abdominal hysterectomy    . Tonsillectomy    . Left elbow-nerve impingement  1985  . Left ankle-screw placement  1980  .  Herniated disk   1995    C-spine  . Fracture left  wrist  2010    Dr. Amanda PeaGramig    reports that she quit smoking about 17 months ago. Her smoking use included Cigarettes. She smoked 0.00 packs per day. She does not have any smokeless tobacco history on file. She reports that she does not drink alcohol or use illicit drugs. family history includes Heart disease in her father; Hyperlipidemia in her other; Hypertension in her other; Stroke in her other. Allergies  Allergen Reactions  . Codeine Hives  . Morphine     REACTION: pt unsure of reaction  . Penicillins     REACTION: rash   Current Outpatient Prescriptions on File Prior to Visit  Medication Sig Dispense Refill  . aspirin EC 81 MG tablet Take 1 tablet (81 mg total) by mouth daily. 90 tablet 11  . atorvastatin (LIPITOR) 40 MG tablet TAKE 1 TABLET BY MOUTH DAILY 90 tablet 0  . Calcium 1200-1000 MG-UNIT CHEW Chew by mouth daily.      Marland Kitchen. donepezil (ARICEPT) 10 MG tablet TAKE 1 TABLET (10 MG TOTAL) BY MOUTH AT BEDTIME. 90 tablet 3  . senna (SENOKOT) 8.6 MG TABS tablet Take 1 tablet (8.6 mg total) by mouth daily as needed for mild constipation. 30 each 0  . vitamin B-12 1000 MCG tablet Take 1 tablet (1,000 mcg total) by mouth daily. 30 tablet 1   No current facility-administered medications on file prior to visit.   Review of Systems Constitutional: Negative for increased diaphoresis, or other activity, appetite  or siginficant weight change other than noted HENT: Negative for worsening hearing loss, ear pain, facial swelling, mouth sores and neck stiffness.   Eyes: Negative for other worsening pain, redness or visual disturbance.  Respiratory: Negative for choking or stridor Cardiovascular: Negative for other chest pain and palpitations.  Gastrointestinal: Negative for worsening diarrhea, blood in stool, or abdominal distention Genitourinary: Negative for hematuria, flank pain or change in urine volume.  Musculoskeletal: Negative for  myalgias or other joint complaints.  Skin: Negative for other color change and wound or drainage.  Neurological: Negative for syncope and numbness. other than noted Hematological: Negative for adenopathy. or other swelling Psychiatric/Behavioral: Negative for hallucinations, SI, self-injury, decreased concentration or other worsening agitation.      Objective:   Physical Exam BP 138/78 mmHg  Pulse 84  Temp(Src) 98.5 F (36.9 C) (Oral)  Resp 20  Wt 220 lb (99.791 kg)  SpO2 94% VS noted,  Constitutional: Pt is oriented to person, place, and time. Appears well-developed and well-nourished, in no significant distress Head: Normocephalic and atraumatic  Eyes: Conjunctivae and EOM are normal. Pupils are equal, round, and reactive to light Right Ear: External ear normal.  Left Ear: External ear normal Nose: Nose normal.  Mouth/Throat: Oropharynx is clear and moist  Neck: Normal range of motion. Neck supple. No JVD present. No tracheal deviation present or significant neck LA or mass Cardiovascular: Normal rate, regular rhythm, normal heart sounds and intact distal pulses.   Pulmonary/Chest: Effort normal and breath sounds without rales or wheezing  Abdominal: Soft. Bowel sounds are normal. NT. No HSM  Musculoskeletal: Normal range of motion. Exhibits no edema Lymphadenopathy: Has no cervical adenopathy.  Neurological: Pt is alert and oriented to person, place, and time. Pt has normal reflexes. No cranial nerve deficit. Motor grossly intact Skin: Skin is warm and dry. No rash noted or new ulcers Psychiatric:  Has normal mood and affect. Behavior is normal.          Vitamin B-12 211 - 911 pg/mL 352 127 (L)       Lab Results  Component Value Date   WBC 6.8 07/07/2014   HGB 10.9* 07/07/2014   HCT 35.6* 07/07/2014   PLT 248 07/07/2014   GLUCOSE 129* 07/07/2014   CHOL 116 07/07/2014   TRIG 68 07/07/2014   HDL 30* 07/07/2014   LDLDIRECT 160.9 08/25/2006   LDLCALC 72 07/07/2014    ALT 13 07/06/2014   AST 16 07/06/2014   NA 138 07/07/2014   K 3.9 07/07/2014   CL 103 07/07/2014   CREATININE 0.78 07/07/2014   BUN 15 07/07/2014   CO2 28 07/07/2014   TSH 0.877 07/05/2014   HGBA1C 6.6* 01/10/2014   MICROALBUR 1.7 01/10/2014       Assessment & Plan:

## 2015-12-06 NOTE — Progress Notes (Signed)
Pre visit review using our clinic review tool, if applicable. No additional management support is needed unless otherwise documented below in the visit note. 

## 2015-12-07 ENCOUNTER — Other Ambulatory Visit: Payer: Self-pay | Admitting: Internal Medicine

## 2015-12-10 ENCOUNTER — Other Ambulatory Visit: Payer: Self-pay | Admitting: Internal Medicine

## 2015-12-10 DIAGNOSIS — D509 Iron deficiency anemia, unspecified: Secondary | ICD-10-CM

## 2015-12-10 MED ORDER — METFORMIN HCL ER 500 MG PO TB24: ORAL_TABLET | ORAL | Status: DC

## 2015-12-14 NOTE — Assessment & Plan Note (Signed)

## 2015-12-14 NOTE — Assessment & Plan Note (Addendum)
stable overall by history and exam, recent data reviewed with pt, and pt to continue medical treatment as before,  to f/u any worsening symptoms or concerns Lab Results  Component Value Date   HGBA1C 7.8* 12/06/2015  In addition to the time spent performing CPE, I spent an additional 15 minutes face to face,in which greater than 50% of this time was spent in counseling and coordination of care for patient's illness as documented.

## 2015-12-14 NOTE — Assessment & Plan Note (Signed)
Asympt, for f/u iron, to f/u any worsening symptoms or concerns

## 2015-12-14 NOTE — Assessment & Plan Note (Signed)
stable overall by history and exam, recent data reviewed with pt, and pt to continue medical treatment as before,  to f/u any worsening symptoms or concerns Lab Results  Component Value Date   LDLCALC 72 07/07/2014

## 2015-12-14 NOTE — Assessment & Plan Note (Signed)
Asympt, for f/u cbc and b12 ,  to f/u any worsening symptoms or concerns

## 2015-12-14 NOTE — Assessment & Plan Note (Signed)
stable overall by history and exam, recent data reviewed with pt, and pt to continue medical treatment as before,  to f/u any worsening symptoms or concerns BP Readings from Last 3 Encounters:  12/06/15 138/78  09/13/14 124/80  07/20/14 113/64

## 2015-12-27 ENCOUNTER — Encounter: Payer: Self-pay | Admitting: Gastroenterology

## 2015-12-27 ENCOUNTER — Ambulatory Visit (INDEPENDENT_AMBULATORY_CARE_PROVIDER_SITE_OTHER): Payer: Medicare Other | Admitting: Gastroenterology

## 2015-12-27 VITALS — BP 128/78 | Ht 61.0 in | Wt 220.0 lb

## 2015-12-27 DIAGNOSIS — Z86010 Personal history of colonic polyps: Secondary | ICD-10-CM | POA: Diagnosis not present

## 2015-12-27 DIAGNOSIS — E611 Iron deficiency: Secondary | ICD-10-CM

## 2015-12-27 MED ORDER — FERROUS SULFATE 325 (65 FE) MG PO TABS: 325.0000 mg | ORAL_TABLET | Freq: Every day | ORAL | 3 refills | Status: DC

## 2015-12-27 NOTE — Patient Instructions (Addendum)
You have been scheduled for an endoscopy and colonoscopy. Please follow the written instructions given to you at your visit today. We have given you a Miralax prep. If you use inhalers (even only as needed), please bring them with you on the day of your procedure. Your physician has requested that you go to www.startemmi.com and enter the access code given to you at your visit today. This web site gives a general overview about your procedure. However, you should still follow specific instructions given to you by our office regarding your preparation for the procedure.       If you are age 33 or older, your body mass index should be between 23-30. Your Body mass index is 41.57 kg/m. If this is out of the aforementioned range listed, please consider follow up with your Primary Care Provider.

## 2015-12-30 ENCOUNTER — Encounter: Payer: Self-pay | Admitting: Gastroenterology

## 2015-12-30 DIAGNOSIS — E611 Iron deficiency: Secondary | ICD-10-CM | POA: Insufficient documentation

## 2015-12-30 NOTE — Progress Notes (Signed)
     12/30/2015 [name] [id number] 1934/07/12   History of Present Illness:  This is a 81 year old female who was previously known to Dr. Arlyce DiceKaplan.  She presents to our office today with her daughter at the request of her PCP, Dr. Jonny RuizJohn, for evaluation of iron deficiency.  When labs checked 3 weeks ago Hgb was 12.3 grams.  MCV normal.  Vitamin B12 levels checked and were normal.  No ferritin checked, but serum iron low at 24 and % sat low at 5.9.  She says that she got some iron supplement OTC and just recently started taking that twice a day.  She denies any dark or bloody stools; says that are just a little darker recently since starting iron supplement.  Says that she does have diarrhea after meals sometimes when she eats certain foods such as salads, etc.  Colonoscopy 10/2007 showed diverticulosis and colon polyps (adenomatous polyps).  EGD 11/2008 showed duodenitis with normal biopsies, no Hpylori.  *Just of note, she is quite entertaining and comical, but somewhat difficult to keep on track and obtain detailed history due to this.   Current Medications, Allergies, Past Medical History, Past Surgical History, Family History and Social History were reviewed in [facility] electronic medical record.   Physical Exam: BP 128/78   Ht 5\' 1"  (1.549 m)   Wt 220 lb (99.8 kg)   BMI 41.57 kg/m  General: Well developed female in no acute distress Head: Normocephalic and atraumatic Eyes:  Sclerae anicteric, conjunctiva pink  Ears: Normal auditory acuity Lungs: Clear throughout to auscultation Heart: Regular rate and rhythm Abdomen: Soft, non-distended.  BS present.  Non-tender. Rectal:  Will be done at the time of colonoscopy. Musculoskeletal: Symmetrical with no gross deformities  Extremities: No edema  Neurological: Alert oriented x 4, grossly non-focal Psychological:  Alert and cooperative. Normal mood and affect  Assessment and Recommendations: -Iron deficiency:  At this  point her Hgb is normal but iron studies low.  Needs colonoscopy due to history of colon polyp and will proceed with EGD as well.  Will schedule with Dr. Lavon PaganiniNandigam.  Will have her start ferrous sulfate 325 mg daily (will send prescription) in place of her current iron supplement. -Personal history of colon polyps:  Overdue for colonoscopy.  Will schedule with Dr. Lavon PaganiniNandigam as stated above.  *The risks, benefits, and alternatives to EGD and colonoscopy were discussed with the patient and her daughter, and they consent to proceed.

## 2016-01-06 ENCOUNTER — Telehealth: Payer: Self-pay | Admitting: Gastroenterology

## 2016-01-06 ENCOUNTER — Encounter: Payer: Medicare Other | Admitting: Gastroenterology

## 2016-01-06 NOTE — Progress Notes (Signed)
Reviewed and agree with documentation and assessment and plan. K. Veena Vaughn Beaumier , MD   

## 2016-01-07 ENCOUNTER — Telehealth: Payer: Self-pay | Admitting: Emergency Medicine

## 2016-01-07 NOTE — Telephone Encounter (Signed)
No charge. 

## 2016-01-07 NOTE — Telephone Encounter (Signed)
Pts daughter wants to know if the prescription donepezil (ARICEPT) 10 MG tablet can be called into [facility] [place]. Its closer for them. Thanks.

## 2016-01-08 MED ORDER — DONEPEZIL HCL 10 MG PO TABS: ORAL_TABLET | ORAL | 3 refills | Status: DC

## 2016-01-08 NOTE — Telephone Encounter (Signed)
Ok , [facility] was [facility] to [facility] [address] ([address]  [facility] [place] [facility] [place]

## 2016-01-16 ENCOUNTER — Encounter: Payer: Self-pay | Admitting: Gastroenterology

## 2016-01-16 ENCOUNTER — Ambulatory Visit (AMBULATORY_SURGERY_CENTER): Payer: Medicare Other | Admitting: Gastroenterology

## 2016-01-16 VITALS — BP 126/57 | HR 58 | Temp 97.7°F | Resp 8 | Ht 61.0 in | Wt 220.0 lb

## 2016-01-16 DIAGNOSIS — D509 Iron deficiency anemia, unspecified: Secondary | ICD-10-CM | POA: Diagnosis not present

## 2016-01-16 DIAGNOSIS — E611 Iron deficiency: Secondary | ICD-10-CM | POA: Diagnosis not present

## 2016-01-16 DIAGNOSIS — Z86010 Personal history of colonic polyps: Secondary | ICD-10-CM

## 2016-01-16 DIAGNOSIS — I10 Essential (primary) hypertension: Secondary | ICD-10-CM | POA: Diagnosis not present

## 2016-01-16 DIAGNOSIS — I509 Heart failure, unspecified: Secondary | ICD-10-CM | POA: Diagnosis not present

## 2016-01-16 DIAGNOSIS — E119 Type 2 diabetes mellitus without complications: Secondary | ICD-10-CM | POA: Diagnosis not present

## 2016-01-16 MED ORDER — SODIUM CHLORIDE 0.9 % IV SOLN: 500.0000 mL | INTRAVENOUS | Status: DC

## 2016-01-16 NOTE — Progress Notes (Signed)
Kathy Thomas, pt's granddaughter was brought back into the admission area to help the pt with her medical history and her meds.  Pt is taking aricept for her memory and could tell pt was not sure about her hx by the answers she gave me.  I asked the pt if it was ok for Kathy Thomas to aid her with her questions and he seemed relieved that Kathy was brought back.  maw

## 2016-01-16 NOTE — Progress Notes (Signed)
Report to PACU, RN, vss, BBS= Clear.  

## 2016-01-16 NOTE — Op Note (Signed)
[facility] Patient Name: Kathy Thomas Procedure Date: 01/16/2016 11:08 AM MRN: [id number] Endoscopist: Napoleon Form , MD Age: 81 Referring MD:  Date of Birth: 1934-10-15 Gender: Female Account #: [id number] Procedure:                Colonoscopy Indications:              Unexplained iron deficiency anemia Medicines:                Monitored Anesthesia Care Procedure:                Pre-Anesthesia Assessment:                           - Prior to the procedure, a History and Physical                            was performed, and patient medications and                            allergies were reviewed. The patient's tolerance of                            previous anesthesia was also reviewed. The risks                            and benefits of the procedure and the sedation                            options and risks were discussed with the patient.                            All questions were answered, and informed consent                            was obtained. Prior Anticoagulants: The patient has                            taken no previous anticoagulant or antiplatelet                            agents. ASA Grade Assessment: III - A patient with                            severe systemic disease. After reviewing the risks                            and benefits, the patient was deemed in                            satisfactory condition to undergo the procedure.                           After obtaining informed consent, the colonoscope  was passed under direct vision. Throughout the                            procedure, the patient's blood pressure, pulse, and                            oxygen saturations were monitored continuously. The                            Model CF-[id number]) scope was introduced                            through the anus and advanced to the the cecum,                            identified by  appendiceal orifice and ileocecal                            valve. The colonoscopy was performed without                            difficulty. The patient tolerated the procedure                            well. The quality of the bowel preparation was                            unsatisfactory. The ileocecal valve, appendiceal                            orifice, and rectum were photographed. Scope In: 11:19:57 AM Scope Out: 11:38:48 AM Scope Withdrawal Time: 0 hours 12 minutes 26 seconds  Total Procedure Duration: 0 hours 18 minutes 51 seconds  Findings:                 The perianal and digital rectal examinations were                            normal.                           A moderate amount of semi-liquid semi-solid stool                            was found in the entire colon. Lavage of the area                            was performed using a large amount of normal                            saline, resulting in incomplete clearance with fair                            visualization.  Multiple small and large-mouthed diverticula were                            found in the sigmoid colon and descending colon.                            There was no evidence of diverticular bleeding.                           Non-bleeding internal hemorrhoids were found during                            retroflexion. The hemorrhoids were medium-sized.                           The exam was otherwise without abnormality. Complications:            No immediate complications. Estimated Blood Loss:     Estimated blood loss: none. Impression:               - Preparation of the colon was unsatisfactory.                           - Stool in the entire examined colon.                           - Moderate diverticulosis in the sigmoid colon and                            in the descending colon. There was no evidence of                            diverticular bleeding.                            - Non-bleeding internal hemorrhoids.                           - The examination was otherwise normal.                           - No specimens collected. Recommendation:           - Patient has a contact number available for                            emergencies. The signs and symptoms of potential                            delayed complications were discussed with the                            patient. Return to normal activities tomorrow.                            Written discharge instructions were provided to the  patient.                           - Resume previous diet.                           - Continue present medications.                           - No repeat colonoscopy due to age.                           -Continue iron supplemets, recheck Fecal Heme                            occult, CBC and iron panel in 3 months. If                            continues to have iron deficiency anemia can                            consider further evaluation with small bowel video                            capsule                           - Return to GI clinic PRN. Napoleon Form, MD 01/16/2016 11:50:25 AM This report has been signed electronically.

## 2016-01-16 NOTE — Progress Notes (Signed)
Gave patient's granddaughter heme stool cards for the patient.

## 2016-01-16 NOTE — Op Note (Signed)
[facility] Patient Name: Kathy Thomas Procedure Date: 01/16/2016 11:08 AM MRN: [id number] Endoscopist: Napoleon Form , MD Age: 81 Referring MD:  Date of Birth: [birth year] Gender: Female Account #: [id number] Procedure:                Upper GI endoscopy Indications:              Suspected upper gastrointestinal bleeding in                            patient with unexplained iron deficiency anemia Medicines:                Monitored Anesthesia Care Procedure:                Pre-Anesthesia Assessment:                           - Prior to the procedure, a History and Physical                            was performed, and patient medications and                            allergies were reviewed. The patient's tolerance of                            previous anesthesia was also reviewed. The risks                            and benefits of the procedure and the sedation                            options and risks were discussed with the patient.                            All questions were answered, and informed consent                            was obtained. Prior Anticoagulants: The patient has                            taken no previous anticoagulant or antiplatelet                            agents. ASA Grade Assessment: III - A patient with                            severe systemic disease. After reviewing the risks                            and benefits, the patient was deemed in                            satisfactory condition to undergo the procedure.  After obtaining informed consent, the endoscope was                            passed under direct vision. Throughout the                            procedure, the patient's blood pressure, pulse, and                            oxygen saturations were monitored continuously. The                            Model [id number]-[id number]) scope was introduced   through the mouth, and advanced to the second part                            of duodenum. The upper GI endoscopy was                            accomplished without difficulty. The patient                            tolerated the procedure well. Scope In: Scope Out: Findings:                 The esophagus was normal.                           The stomach was normal.                           The examined duodenum was normal. Complications:            No immediate complications. Estimated Blood Loss:     Estimated blood loss: none. Impression:               - Normal esophagus.                           - Normal stomach.                           - Normal examined duodenum.                           - No specimens collected. Recommendation:           - Patient has a contact number available for                            emergencies. The signs and symptoms of potential                            delayed complications were discussed with the                            patient. Return to normal activities tomorrow.  Written discharge instructions were provided to the                            patient.                           - Resume previous diet.                           - Continue present medications.                           - No ibuprofen, naproxen, or other non-steroidal                            anti-inflammatory drugs.                           - No repeat upper endoscopy.                           - Return to GI clinic PRN. Napoleon FormKavitha V. Anselm Aumiller, MD 01/16/2016 11:44:08 AM This report has been signed electronically.

## 2016-01-16 NOTE — Patient Instructions (Signed)
Normal exams today. Diverticulosis, hemorrhoids seen, handouts given. NO ibuprofen, naproxen, or other non-steriodal anti-inflammatory medications.  Continue with iron supplements, recheck fecal heme occult, CBC and iron panel in 3 months. Resume current medications.  Call us with any questions or concerns. Thank you!   YOU HAD AN ENDOSCOPIC PROCEDURE TODAY AT THE [facility]:   Refer to the procedure report that was given to you for any specific questions about what was found during the examination.  If the procedure report does not answer your questions, please call your gastroenterologist to clarify.  If you requested that your care partner not be given the details of your procedure findings, then the procedure report has been included in a sealed envelope for you to review at your convenience later.  YOU SHOULD EXPECT: Some feelings of bloating in the abdomen. Passage of more gas than usual.  Walking can help get rid of the air that was put into your GI tract during the procedure and reduce the bloating. If you had a lower endoscopy (such as a colonoscopy or flexible sigmoidoscopy) you may notice spotting of blood in your stool or on the toilet paper. If you underwent a bowel prep for your procedure, you may not have a normal bowel movement for a few days.  Please Note:  You might notice some irritation and congestion in your nose or some drainage.  This is from the oxygen used during your procedure.  There is no need for concern and it should clear up in a day or so.  SYMPTOMS TO REPORT IMMEDIATELY:   Following lower endoscopy (colonoscopy or flexible sigmoidoscopy):  Excessive amounts of blood in the stool  Significant tenderness or worsening of abdominal pains  Swelling of the abdomen that is new, acute  Fever of 100F or higher   Following upper endoscopy (EGD)  Vomiting of blood or coffee ground material  New chest pain or pain under the shoulder blades  Painful or  persistently difficult swallowing  New shortness of breath  Fever of 100F or higher  Black, tarry-looking stools  For urgent or emergent issues, a gastroenterologist can be reached at any hour by calling [phone].   DIET:  We do recommend a small meal at first, but then you may proceed to your regular diet.  Drink plenty of fluids but you should avoid alcoholic beverages for 24 hours.  ACTIVITY:  You should plan to take it easy for the rest of today and you should NOT DRIVE or use heavy machinery until tomorrow (because of the sedation medicines used during the test).    FOLLOW UP: Our staff will call the number listed on your records the next business day following your procedure to check on you and address any questions or concerns that you may have regarding the information given to you following your procedure. If we do not reach you, we will leave a message.  However, if you are feeling well and you are not experiencing any problems, there is no need to return our call.  We will assume that you have returned to your regular daily activities without incident.  If any biopsies were taken you will be contacted by phone or by letter within the next 1-3 weeks.  Please call us at [phone] if you have not heard about the biopsies in 3 weeks.    SIGNATURES/CONFIDENTIALITY: You and/or your care partner have signed paperwork which will be entered into your electronic medical record.  These signatures attest  to the fact that that the information above on your After Visit Summary has been reviewed and is understood.  Full responsibility of the confidentiality of this discharge information lies with you and/or your care-partner. 

## 2016-01-17 ENCOUNTER — Telehealth: Payer: Self-pay

## 2016-01-17 NOTE — Telephone Encounter (Signed)
  Follow up Call-  Call back number 01/16/2016  Post procedure Call Back phone  # #[phone] cell  Permission to leave phone message Yes  Some recent data might be hidden    Patient was called for follow up after her procedure on 01/16/2016. No answer at the number given for follow up phone call. A message was left on the answering machine.

## 2016-01-20 ENCOUNTER — Other Ambulatory Visit: Payer: Self-pay

## 2016-01-20 DIAGNOSIS — D509 Iron deficiency anemia, unspecified: Secondary | ICD-10-CM

## 2016-01-23 ENCOUNTER — Other Ambulatory Visit: Payer: Self-pay | Admitting: Internal Medicine

## 2016-02-07 ENCOUNTER — Encounter: Payer: Medicare Other | Admitting: Gastroenterology

## 2016-03-03 ENCOUNTER — Telehealth: Payer: Self-pay

## 2016-03-03 ENCOUNTER — Telehealth: Payer: Self-pay | Admitting: Internal Medicine

## 2016-03-03 MED ORDER — ATORVASTATIN CALCIUM 40 MG PO TABS: 40.0000 mg | ORAL_TABLET | Freq: Every day | ORAL | 0 refills | Status: DC

## 2016-03-03 NOTE — Telephone Encounter (Signed)
Medication sent to pharmacy  

## 2016-03-03 NOTE — Telephone Encounter (Signed)
atorvastatin (LIPITOR) 40 MG tablet   Patient is requesting a refill on this medication.

## 2016-03-30 ENCOUNTER — Telehealth: Payer: Self-pay | Admitting: Internal Medicine

## 2016-03-30 ENCOUNTER — Telehealth: Payer: Self-pay | Admitting: Gastroenterology

## 2016-03-30 NOTE — Telephone Encounter (Signed)
Patient likely is having fecal incontinence with leakage of stool from the rectum secondary to incomplete evacuation, she may benefit from Kegel exercises and pelvic floor physical therapy, please send referral if patient is willing to do pelvic floor PT. Also recommend starting a probiotic daily and Benefiber 1 tablespoon 3 times daily with meals.

## 2016-03-30 NOTE — Telephone Encounter (Signed)
Patient Name: Kathy BussingBETTY Thomas DOB: [birth year] Initial Comment Caller says, mother has diarrhea, 3-4 bouts a day. She thinks it may be caused by the iron suppliments. Nurse Assessment Nurse: Yetta BarreJones, RN, Miranda Date/Time (Eastern Time): 03/30/2016 10:32:32 AM Confirm and document reason for call. If symptomatic, describe symptoms. You must click the next button to save text entered. ---Caller states she is not currently with the pt and is not her primary care giver. They are concerned that the iron supplements she is taking are causing her diarrhea. However, she has a history of GI issues and seen a [facility], which is who prescribed the iron pills. Has the patient traveled out of the country within the last 30 days? ---Not Applicable Does the patient have any new or worsening symptoms? ---Yes Will a triage be completed? ---No Select reason for no triage. ---Other Please document clinical information provided and list any resource used. ---Unable to triage because caller is unable to answer the questions. Suggested she speak with GI doctor since they prescribed the medication are are her specialist. Transferred [facility] to [facility]. Guidelines Guideline Title Affirmed Question Affirmed Notes Final Disposition User Clinical Call Yetta BarreJones, RN, [employer]

## 2016-03-30 NOTE — Telephone Encounter (Signed)
Daughter says the mother is incontinent of her bladder and bowels at times. She is wearing depends which has helped with part of the issue. After her procedure in August, she started having looser bowel movements. The daughter thinks it may be because of the iron supplement. She is not sick. Appetite is good. Eating healthier. Has 2 to 3 loose stools in a day. Daughter wants to know what she can give her mom to help with this.

## 2016-03-30 NOTE — Telephone Encounter (Signed)
Instructed. They will try the probiotic and Benefiber first. Patient has dementia and she has difficulty follow instructions on new things.

## 2016-05-06 ENCOUNTER — Other Ambulatory Visit: Payer: Self-pay | Admitting: Internal Medicine

## 2016-05-28 ENCOUNTER — Other Ambulatory Visit: Payer: Self-pay | Admitting: Internal Medicine

## 2016-06-09 ENCOUNTER — Ambulatory Visit: Payer: Medicare Other | Admitting: Internal Medicine

## 2016-06-09 ENCOUNTER — Other Ambulatory Visit (INDEPENDENT_AMBULATORY_CARE_PROVIDER_SITE_OTHER): Payer: Medicare Other

## 2016-06-09 DIAGNOSIS — E119 Type 2 diabetes mellitus without complications: Secondary | ICD-10-CM | POA: Diagnosis not present

## 2016-06-09 LAB — BASIC METABOLIC PANEL
BUN: 19 mg/dL (ref 6–23)
CHLORIDE: 100 meq/L (ref 96–112)
CO2: 32 mEq/L (ref 19–32)
CREATININE: 0.98 mg/dL (ref 0.40–1.20)
Calcium: 9.5 mg/dL (ref 8.4–10.5)
GFR: 58.16 mL/min — ABNORMAL LOW (ref >60.00)
GLUCOSE: 94 mg/dL (ref 70–99)
POTASSIUM: 4.4 meq/L (ref 3.5–5.1)
Sodium: 139 mEq/L (ref 135–145)

## 2016-06-09 LAB — HEMOGLOBIN A1C: Hgb A1c MFr Bld: 7.3 % — ABNORMAL HIGH (ref 4.6–6.5)

## 2016-06-09 LAB — LIPID PANEL
CHOL/HDL RATIO: 3
CHOLESTEROL: 141 mg/dL (ref 0–200)
HDL: 41.5 mg/dL (ref >39.00)
LDL CALC: 69 mg/dL (ref 0–99)
NonHDL: 99.07
TRIGLYCERIDES: 150 mg/dL — AB (ref 0.0–149.0)
VLDL: 30 mg/dL (ref 0.0–40.0)

## 2016-06-09 LAB — HEPATIC FUNCTION PANEL
ALT: 15 U/L (ref 0–35)
AST: 16 U/L (ref 0–37)
Albumin: 4 g/dL (ref 3.5–5.2)
Alkaline Phosphatase: 83 U/L (ref 39–117)
Bilirubin, Direct: 0.1 mg/dL (ref 0.0–0.3)
Total Bilirubin: 0.6 mg/dL (ref 0.2–1.2)
Total Protein: 7.3 g/dL (ref 6.0–8.3)

## 2016-06-11 ENCOUNTER — Ambulatory Visit: Payer: Medicare Other | Admitting: Internal Medicine

## 2016-06-18 ENCOUNTER — Other Ambulatory Visit: Payer: Self-pay | Admitting: Internal Medicine

## 2016-06-25 ENCOUNTER — Ambulatory Visit (INDEPENDENT_AMBULATORY_CARE_PROVIDER_SITE_OTHER)
Admission: RE | Admit: 2016-06-25 | Discharge: 2016-06-25 | Disposition: A | Discharge status: Home / Self Care | Payer: Medicare Other | Source: Ambulatory Visit | Attending: Internal Medicine | Admitting: Internal Medicine

## 2016-06-25 ENCOUNTER — Ambulatory Visit (INDEPENDENT_AMBULATORY_CARE_PROVIDER_SITE_OTHER): Payer: Medicare Other | Admitting: Internal Medicine

## 2016-06-25 VITALS — BP 134/76 | HR 72 | Temp 98.1°F | Resp 20 | Wt 208.0 lb

## 2016-06-25 DIAGNOSIS — I10 Essential (primary) hypertension: Secondary | ICD-10-CM

## 2016-06-25 DIAGNOSIS — E119 Type 2 diabetes mellitus without complications: Secondary | ICD-10-CM | POA: Diagnosis not present

## 2016-06-25 DIAGNOSIS — R634 Abnormal weight loss: Secondary | ICD-10-CM

## 2016-06-25 DIAGNOSIS — Z0001 Encounter for general adult medical examination with abnormal findings: Secondary | ICD-10-CM

## 2016-06-25 DIAGNOSIS — E785 Hyperlipidemia, unspecified: Secondary | ICD-10-CM

## 2016-06-25 DIAGNOSIS — R918 Other nonspecific abnormal finding of lung field: Secondary | ICD-10-CM | POA: Diagnosis not present

## 2016-06-25 NOTE — Progress Notes (Signed)
Subjective:    Patient ID: Kathy Thomas, female    DOB: 05-Jun-1934, 82 y.o.   MRN: [id number]  HPI Here to f/u; overall doing ok,  Pt denies chest pain, increasing sob or doe, wheezing, orthopnea, PND, increased LE swelling, palpitations, dizziness or syncope.  Pt denies new neurological symptoms such as new headache, or facial or extremity weakness or numbness.  Pt denies polydipsia, polyuria, or low sugar episode.   Pt denies new neurological symptoms such as new headache, or facial or extremity weakness or numbness.   Pt states overall good compliance with meds, mostly trying to follow appropriate diet, with wt overall stable,  but little exercise however. Denies worsening reflux, abd pain, dysphagia, n/v, or blood but has intermittent chronic diarrhea, probiotic not helping.  Pt denies fever, night sweats, loss of appetite, or other constitutional symptoms except for wt loss recent for unclear reasons, appetite ok Wt Readings from Last 3 Encounters:  06/25/16 208 lb (94.3 kg)  01/16/16 220 lb (99.8 kg)  12/27/15 220 lb (99.8 kg)  No other new hx.  Still having recurring left sciatica as well Past Medical History:  Diagnosis Date  . Acute sinusitis, unspecified 05/28/2008  . ANEMIA-IRON DEFICIENCY 10/26/2008  . ANXIETY 10/06/2007  . CHOLELITHIASIS 10/06/2007  . COLONIC POLYPS, HX OF 01/26/2007  . DIABETES MELLITUS, TYPE II 10/06/2007  . DIVERTICULOSIS, COLON 01/26/2007  . ECCHYMOSES, SPONTANEOUS 06/04/2010  . GERD 01/26/2007  . Heart murmur    slight heart murmur  . HERNIATED DISC 01/26/2007  . HYPERLIPIDEMIA 10/06/2007  . HYPERTENSION 01/26/2007  . LOW BACK PAIN 01/29/2007  . MENOPAUSAL DISORDER 10/26/2008  . OSTEOPOROSIS 06/04/2010  . SHOULDER PAIN, LEFT 10/06/2007  . Stroke [facility](HCC)    TIA's   Past Surgical History:  Procedure Laterality Date  . ABDOMINAL HYSTERECTOMY    . APPENDECTOMY    . Fracture left  wrist  2010   Dr. Amanda PeaGramig  . Herniated disk   1995   C-spine  . Left ankle-screw  placement  1980  . Left elbow-nerve impingement  1985  . TONSILLECTOMY      reports that she quit smoking about 2 years ago. Her smoking use included Cigarettes. She smoked 0.00 packs per day. She has never used smokeless tobacco. She reports that she does not drink alcohol or use drugs. family history includes Heart disease in her father; Hyperlipidemia in her other; Hypertension in her other; Stroke in her other. Allergies  Allergen Reactions  . Codeine Hives  . Penicillins     REACTION: rash  . Morphine Rash    REACTION: pt unsure of reaction   Current Outpatient Prescriptions on File Prior to Visit  Medication Sig Dispense Refill  . aspirin EC 81 MG tablet Take 1 tablet (81 mg total) by mouth daily. 90 tablet 11  . atorvastatin (LIPITOR) 40 MG tablet TAKE 1 TABLET BY MOUTH EVERY DAY (BEDTIME) 90 tablet 0  . Calcium 1200-1000 MG-UNIT CHEW Chew by mouth daily.      Marland Kitchen. donepezil (ARICEPT) 10 MG tablet TAKE 1 TABLET (10 MG TOTAL) BY MOUTH AT BEDTIME. 90 tablet 3  . ferrous gluconate (IRON 27) 240 (27 FE) MG tablet Take 240 mg by mouth 2 (two) times daily.    . ferrous sulfate 325 (65 FE) MG tablet Take 1 tablet (325 mg total) by mouth daily with breakfast. 90 tablet 3  . losartan-hydrochlorothiazide (HYZAAR) 100-12.5 MG tablet TAKE 1 TABLET BY MOUTH EVERY DAY 90 tablet 3  .  metFORMIN (GLUCOPHAGE-XR) 500 MG 24 hr tablet TAKE 4 TABLETS BY MOUTH DAILY WITH BREAKFAST 360 tablet 3  . metFORMIN (GLUCOPHAGE-XR) 500 MG 24 hr tablet TAKE 2 TABLETS BY MOUTH DAILY WITH BREAKFAST 180 tablet 0  . metFORMIN (GLUCOPHAGE-XR) 500 MG 24 hr tablet TAKE 2 TABLETS BY MOUTH DAILY WITH BREAKFAST 80 tablet 0  . senna (SENOKOT) 8.6 MG TABS tablet Take 1 tablet (8.6 mg total) by mouth daily as needed for mild constipation. 30 each 0  . vitamin B-12 1000 MCG tablet Take 1 tablet (1,000 mcg total) by mouth daily. 30 tablet 1   Current Facility-Administered Medications on File Prior to Visit  Medication Dose Route  Frequency Provider Last Rate Last Dose  . 0.9 %  sodium chloride infusion  500 mL Intravenous Continuous Kavitha Nandigam V, MD        Review of Systems  Constitutional: Negative for unusual diaphoresis or night sweats HENT: Negative for ear swelling or discharge Eyes: Negative for worsening visual haziness  Respiratory: Negative for choking and stridor.   Gastrointestinal: Negative for distension or worsening eructation Genitourinary: Negative for retention or change in urine volume.  Musculoskeletal: Negative for other MSK pain or swelling Skin: Negative for color change and worsening wound Neurological: Negative for tremors and numbness other than noted  Psychiatric/Behavioral: Negative for decreased concentration or agitation other than above   All other system neg per pt    Objective:   Physical Exam BP 134/76   Pulse 72   Temp 98.1 F (36.7 C) (Oral)   Resp 20   Wt 208 lb (94.3 kg)   SpO2 94%   BMI 39.30 kg/m  VS noted,  Constitutional: Pt appears in no apparent distress HENT: Head: NCAT.  Right Ear: External ear normal.  Left Ear: External ear normal.  Eyes: . Pupils are equal, round, and reactive to light. Conjunctivae and EOM are normal Neck: Normal range of motion. Neck supple.  Cardiovascular: Normal rate and regular rhythm.   Pulmonary/Chest: Effort normal and breath sounds without rales or wheezing.  Abd:  Soft, NT, ND, + BS Neurological: Pt is alert. Not confused , motor grossly intact Skin: Skin is warm. No rash, no LE edema Psychiatric: Pt behavior is normal. No agitation.  No other new exam findings    Assessment & Plan:

## 2016-06-25 NOTE — Progress Notes (Signed)
Pre visit review using our clinic review tool, if applicable. No additional management support is needed unless otherwise documented below in the visit note. 

## 2016-06-25 NOTE — Patient Instructions (Signed)
Please continue all other medications as before, and refills have been done if requested.  Please have the pharmacy call with any other refills you may need.  Please continue your efforts at being more active, low cholesterol diet, and weight control.  You are otherwise up to date with prevention measures today.  Please keep your appointments with your specialists as you may have planned  Please go to the XRAY Department in the Basement (go straight as you get off the elevator) for the x-ray testing  You will be contacted by phone if any changes need to be made immediately.  Otherwise, you will receive a letter about your results with an explanation, but please check with MyChart first.  Please remember to sign up for MyChart if you have not done so, as this will be important to you in the future with finding out test results, communicating by private email, and scheduling acute appointments online when needed.  Please return in 6 months, or sooner if needed, with Lab testing done 3-5 days before  

## 2016-06-26 NOTE — Assessment & Plan Note (Signed)
stable overall by history and exam, recent data reviewed with pt, and pt to continue medical treatment as before,  to f/u any worsening symptoms or concerns Lab Results  Component Value Date   HGBA1C 7.3 (H) 06/09/2016

## 2016-06-26 NOTE — Assessment & Plan Note (Signed)
Etiology unclear, is former smoker, for cxr,  to f/u any worsening symptoms or concerns

## 2016-06-26 NOTE — Assessment & Plan Note (Signed)
stable overall by history and exam, recent data reviewed with pt, and pt to continue medical treatment as before,  to f/u any worsening symptoms or concerns Lab Results  Component Value Date   LDLCALC 69 06/09/2016

## 2016-06-26 NOTE — Assessment & Plan Note (Signed)
stable overall by history and exam, recent data reviewed with pt, and pt to continue medical treatment as before,  to f/u any worsening symptoms or concerns BP Readings from Last 3 Encounters:  06/25/16 134/76  01/16/16 (!) 126/57  12/27/15 128/78

## 2016-09-02 ENCOUNTER — Other Ambulatory Visit: Payer: Self-pay | Admitting: Internal Medicine

## 2016-09-20 ENCOUNTER — Other Ambulatory Visit: Payer: Self-pay | Admitting: Internal Medicine

## 2016-09-21 ENCOUNTER — Other Ambulatory Visit: Payer: Self-pay | Admitting: Internal Medicine

## 2016-12-18 ENCOUNTER — Other Ambulatory Visit: Payer: Self-pay | Admitting: Internal Medicine

## 2017-01-12 ENCOUNTER — Other Ambulatory Visit: Payer: Self-pay | Admitting: Internal Medicine

## 2017-01-12 ENCOUNTER — Other Ambulatory Visit: Payer: Self-pay | Admitting: Gastroenterology

## 2017-01-12 NOTE — Telephone Encounter (Signed)
Will you look at this and see if patient needs these refills of Iron, or needs an office appointment or labs first  Thanks

## 2017-01-19 ENCOUNTER — Other Ambulatory Visit: Payer: Self-pay | Admitting: Internal Medicine

## 2017-01-22 ENCOUNTER — Telehealth: Payer: Self-pay | Admitting: Internal Medicine

## 2017-01-22 NOTE — Telephone Encounter (Signed)
Please have patient check CBC, ferritin and iron panel. Thanks

## 2017-01-22 NOTE — Telephone Encounter (Signed)
Pt called in and would like to know due to the recall on the losartan-hydrochlorothiazide (HYZAAR) 100-12.5 MG tablet [[id number]][[id number]] does she need to stop this med and something new called in

## 2017-01-22 NOTE — Telephone Encounter (Signed)
Left a message for patient to return my call. 

## 2017-01-26 NOTE — Telephone Encounter (Signed)
There would be no need, since the recall only involves Valsartan (not losartan) that was made in [place] and found to be contaminated with NDMA (a possible cancer causing agent)

## 2017-01-26 NOTE — Telephone Encounter (Signed)
Notified pt w/MD response.../lmb 

## 2017-01-27 ENCOUNTER — Other Ambulatory Visit: Payer: Self-pay | Admitting: *Deleted

## 2017-01-27 ENCOUNTER — Other Ambulatory Visit: Payer: Self-pay | Admitting: Gastroenterology

## 2017-01-27 DIAGNOSIS — D509 Iron deficiency anemia, unspecified: Secondary | ICD-10-CM

## 2017-01-27 NOTE — Telephone Encounter (Signed)
Spoke with daughter and explained that the patient needs to have labs drawn prior to ferrous sulfate refill. Lab orders put in Epic. Daughter states she will bring her by the lab Friday 01/29/17.

## 2017-04-07 ENCOUNTER — Ambulatory Visit (INDEPENDENT_AMBULATORY_CARE_PROVIDER_SITE_OTHER)
Admission: RE | Admit: 2017-04-07 | Discharge: 2017-04-07 | Disposition: A | Discharge status: Home / Self Care | Payer: Medicare Other | Source: Ambulatory Visit | Attending: Family Medicine | Admitting: Family Medicine

## 2017-04-07 ENCOUNTER — Ambulatory Visit: Payer: Medicare Other | Admitting: Family Medicine

## 2017-04-07 ENCOUNTER — Encounter: Payer: Self-pay | Admitting: Family Medicine

## 2017-04-07 VITALS — BP 132/78 | HR 52 | Temp 97.8°F | Ht 61.0 in

## 2017-04-07 DIAGNOSIS — M25571 Pain in right ankle and joints of right foot: Secondary | ICD-10-CM

## 2017-04-07 DIAGNOSIS — Z23 Encounter for immunization: Secondary | ICD-10-CM

## 2017-04-07 DIAGNOSIS — M7989 Other specified soft tissue disorders: Secondary | ICD-10-CM | POA: Diagnosis not present

## 2017-04-07 DIAGNOSIS — S99911A Unspecified injury of right ankle, initial encounter: Secondary | ICD-10-CM | POA: Diagnosis not present

## 2017-04-07 NOTE — Progress Notes (Signed)
Kathy Thomas MRN [id number]  Date of birth: 04-Jan-1935  SUBJECTIVE:  Including CC & ROS.  Chief Complaint  Patient presents with  . Right ankle pain    Fell last night off of a curb-she states it is tender. She can still walk on it but hurts at times.    [name] is a 82 y.o. Thomas that is presenting with right ankle pain. She had an inversion injury yesterday. She is able to walk but does have some pain. Has swelling but no bruising. Denies any numbness or tingling. Has not taken anything for pain. Pain is throbbing. Pain is localized. Pain is mild to moderate in nature.  No bone scan to review    Review of Systems  Constitutional: Negative for fever.  Musculoskeletal: Positive for gait problem. Negative for joint swelling.  Skin: Negative for color change.  Neurological: Negative for weakness and numbness.  Hematological: Negative for adenopathy.    HISTORY: Past Medical, Surgical, Social, and Family History Reviewed & Updated per EMR.   Pertinent Historical Findings include:  Past Medical History:  Diagnosis Date  . Acute sinusitis, unspecified 05/28/2008  . ANEMIA-IRON DEFICIENCY 10/26/2008  . ANXIETY 10/06/2007  . CHOLELITHIASIS 10/06/2007  . COLONIC POLYPS, HX OF 01/26/2007  . DIABETES MELLITUS, TYPE II 10/06/2007  . DIVERTICULOSIS, COLON 01/26/2007  . ECCHYMOSES, SPONTANEOUS 06/04/2010  . GERD 01/26/2007  . Heart murmur    slight heart murmur  . HERNIATED DISC 01/26/2007  . HYPERLIPIDEMIA 10/06/2007  . HYPERTENSION 01/26/2007  . LOW BACK PAIN 01/29/2007  . MENOPAUSAL DISORDER 10/26/2008  . OSTEOPOROSIS 06/04/2010  . SHOULDER PAIN, LEFT 10/06/2007  . Stroke [facility](HCC)    TIA's    Past Surgical History:  Procedure Laterality Date  . ABDOMINAL HYSTERECTOMY    . APPENDECTOMY    . Fracture left  wrist  2010   Dr. Amanda PeaGramig  . Herniated disk   1995   C-spine  . Left ankle-screw placement  1980  . Left elbow-nerve impingement  1985  . TONSILLECTOMY      Allergies   Allergen Reactions  . Codeine Hives  . Penicillins     REACTION: rash  . Morphine Rash    REACTION: pt unsure of reaction    Family History  Problem Relation Age of Onset  . Heart disease Father   . Hyperlipidemia Other   . Stroke Other   . Hypertension Other   . Esophageal cancer Neg Hx   . Colon polyps Neg Hx   . Rectal cancer Neg Hx   . Stomach cancer Neg Hx      Social History   Socioeconomic History  . Marital status: Widowed    Spouse name: Not on file  . Number of children: Not on file  . Years of education: Not on file  . Highest education level: Not on file  Social Needs  . Financial resource strain: Not on file  . Food insecurity - worry: Not on file  . Food insecurity - inability: Not on file  . Transportation needs - medical: Not on file  . Transportation needs - non-medical: Not on file  Occupational History  . Not on file  Tobacco Use  . Smoking status: Former Smoker    Packs/day: 0.00    Types: Cigarettes    Last attempt to quit: 06/29/2013    Years since quitting: 3.7  . Smokeless tobacco: Never Used  Substance and Sexual Activity  . Alcohol use: No  .  Drug use: No  . Sexual activity: Not on file  Other Topics Concern  . Not on file  Social History Narrative  . Not on file     PHYSICAL EXAM:  VS: BP 132/78 (BP Location: Left Arm, Patient Position: Sitting, Cuff Size: Normal)   Pulse (!) 52   Temp 97.8 F (36.6 C) (Oral)   Ht 5\' 1"  (1.549 m)   SpO2 98%   BMI 39.30 kg/m  Physical Exam Gen: NAD, alert, cooperative with exam, well-appearing ENT: normal lips, normal nasal mucosa,  Eye: normal EOM, normal conjunctiva and lids CV:  no edema, +2 pedal pulses   Resp: no accessory muscle use, non-labored,  Skin: no rashes, no areas of induration  Neuro: normal tone, normal sensation to touch Psych:  normal insight, alert and oriented MSK:  Right ankle:  Significant swelling on the lateral malleolus. No bruising. Normal ankle range of  motion. Gait with a slight limp. Some translation was anterior drawer. No tenderness to palpation at the base the fifth metatarsal, navicular, medial or lateral malleolus. Neurovascularly intact.       ASSESSMENT & PLAN:   Acute right ankle pain Independent review of the x-ray does not demonstrate a fracture. Likely an ankle sprain. - Tylenol for pain - Counseled on home exercises - If pain persists or has trouble walking can send referral to physical therapy.

## 2017-04-07 NOTE — Patient Instructions (Addendum)
Thank you for coming in,   Come up to the lobby after the xray.   Please use ice and tylenol for the pain.    Please feel free to call with any questions or concerns at any time, at [phone]. --Dr. Jordan LikesSchmitz

## 2017-04-08 DIAGNOSIS — M25571 Pain in right ankle and joints of right foot: Secondary | ICD-10-CM | POA: Insufficient documentation

## 2017-04-08 NOTE — Assessment & Plan Note (Signed)
Independent review of the x-ray does not demonstrate a fracture. Likely an ankle sprain. - Tylenol for pain - Counseled on home exercises - If pain persists or has trouble walking can send referral to physical therapy.

## 2017-06-05 ENCOUNTER — Other Ambulatory Visit: Payer: Self-pay | Admitting: Internal Medicine

## 2017-06-24 ENCOUNTER — Other Ambulatory Visit: Payer: Self-pay | Admitting: Internal Medicine

## 2017-06-28 ENCOUNTER — Other Ambulatory Visit: Payer: Self-pay

## 2017-06-28 MED ORDER — METFORMIN HCL ER 500 MG PO TB24: ORAL_TABLET | ORAL | 0 refills | Status: DC

## 2017-07-18 ENCOUNTER — Other Ambulatory Visit: Payer: Self-pay | Admitting: Internal Medicine

## 2017-07-19 NOTE — Telephone Encounter (Signed)
Done erx  To please ask pt or family for ROV for further refills

## 2017-08-18 ENCOUNTER — Other Ambulatory Visit: Payer: Self-pay | Admitting: Internal Medicine

## 2017-08-18 NOTE — Telephone Encounter (Signed)
Routing to dr john, please advise, thanks 

## 2017-08-26 IMAGING — DX DG CHEST 2V
2 series · 2 of 2 positions shown · non-contrast
Comparison: 07/05/2014 portable chest.

CLINICAL DATA: 81-year-old female former smoker. Initial encounter.

EXAM:
CHEST  2 VIEW

[chest pa]
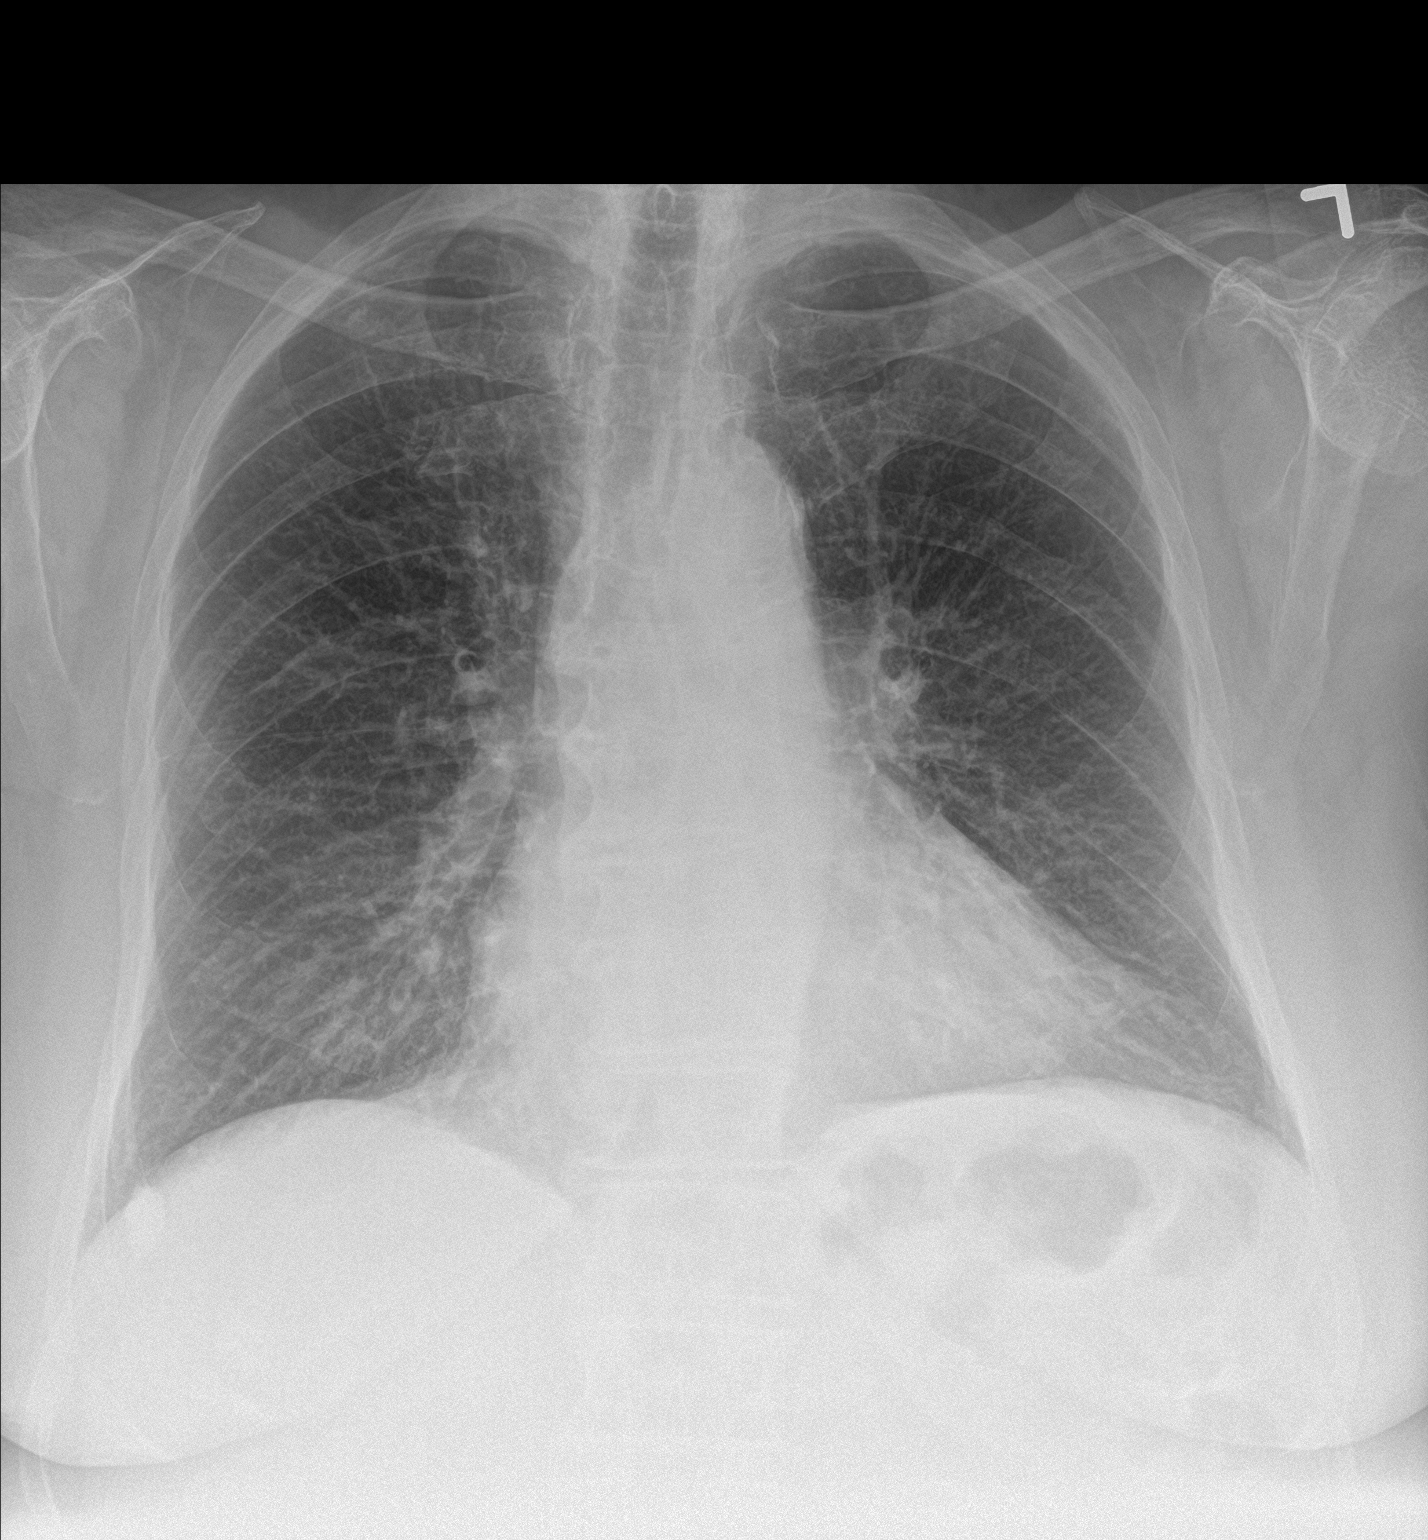

[chest lat]
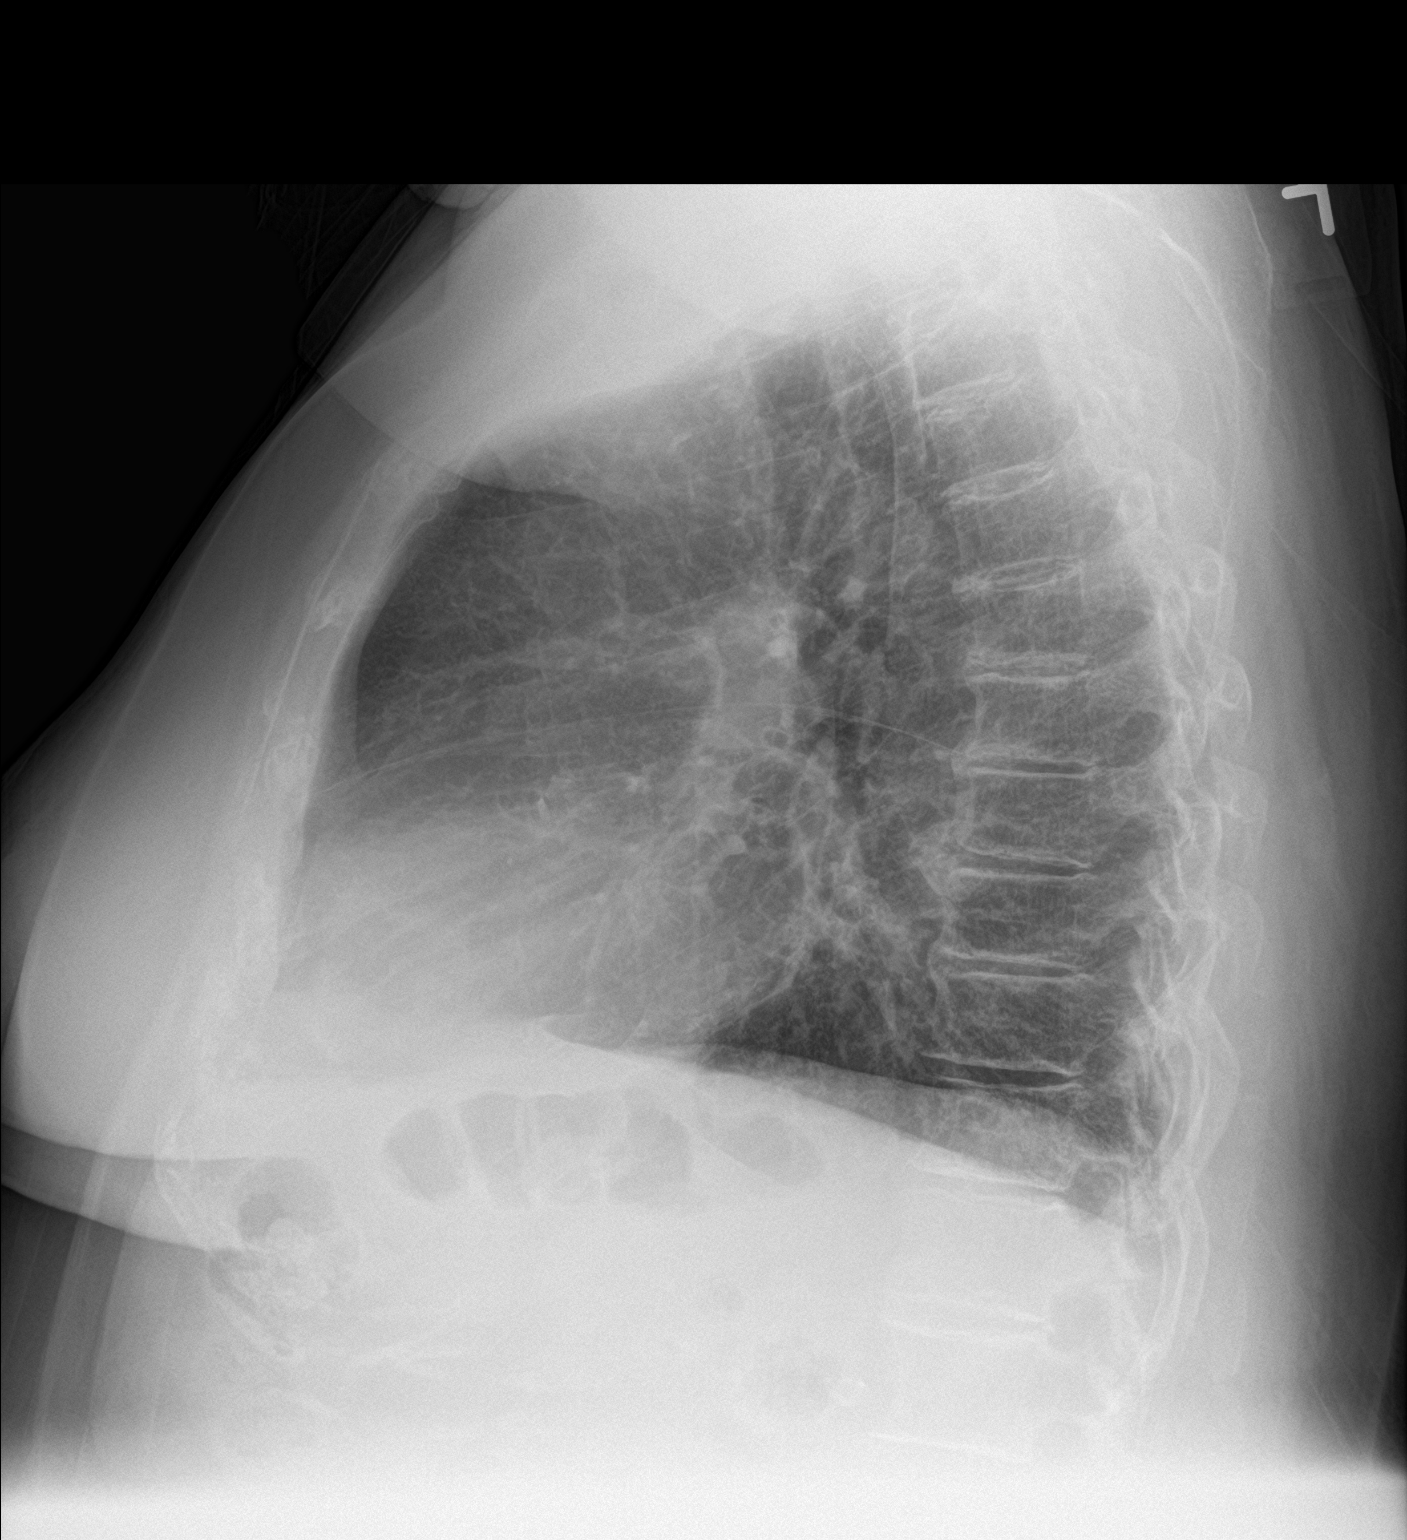

[2 of 2 positions shown; findings below may reference images not displayed]

FINDINGS: Lung volumes are at the upper limits of normal to mildly
hyperinflated. Mild diffuse increased pulmonary interstitial
markings appear stable. Cardiac size can mediastinal contours are
within normal limits. Visualized tracheal air column is within
normal limits. Chronic right breast soft tissue calcification
projects near the right hemidiaphragm today. No acute osseous
abnormality identified. Negative visible bowel gas pattern.
IMPRESSION: No acute cardiopulmonary abnormality.

## 2017-08-31 ENCOUNTER — Other Ambulatory Visit: Payer: Self-pay | Admitting: Internal Medicine

## 2017-09-01 ENCOUNTER — Other Ambulatory Visit (INDEPENDENT_AMBULATORY_CARE_PROVIDER_SITE_OTHER): Payer: Medicare Other

## 2017-09-01 ENCOUNTER — Ambulatory Visit: Payer: Medicare Other | Admitting: Internal Medicine

## 2017-09-01 ENCOUNTER — Encounter: Payer: Self-pay | Admitting: Internal Medicine

## 2017-09-01 VITALS — BP 122/78 | HR 60 | Temp 97.8°F | Ht 61.0 in | Wt 210.0 lb

## 2017-09-01 DIAGNOSIS — E119 Type 2 diabetes mellitus without complications: Secondary | ICD-10-CM

## 2017-09-01 DIAGNOSIS — E2839 Other primary ovarian failure: Secondary | ICD-10-CM | POA: Diagnosis not present

## 2017-09-01 DIAGNOSIS — R269 Unspecified abnormalities of gait and mobility: Secondary | ICD-10-CM

## 2017-09-01 DIAGNOSIS — Z0000 Encounter for general adult medical examination without abnormal findings: Secondary | ICD-10-CM | POA: Diagnosis not present

## 2017-09-01 LAB — MICROALBUMIN / CREATININE URINE RATIO
Creatinine,U: 71.3 mg/dL
MICROALB/CREAT RATIO: 1 mg/g (ref 0.0–30.0)
Microalb, Ur: <0.7 mg/dL (ref 0.0–1.9)

## 2017-09-01 LAB — URINALYSIS, ROUTINE W REFLEX MICROSCOPIC
BILIRUBIN URINE: NEGATIVE
Hgb urine dipstick: NEGATIVE
KETONES UR: NEGATIVE
Leukocytes, UA: NEGATIVE
NITRITE: NEGATIVE
PH: 6 (ref 5.0–8.0)
RBC / HPF: NONE SEEN (ref 0–?)
SPECIFIC GRAVITY, URINE: 1.01 (ref 1.000–1.030)
Total Protein, Urine: NEGATIVE
Urine Glucose: 100 — AB
Urobilinogen, UA: 0.2 (ref 0.0–1.0)
WBC, UA: NONE SEEN (ref 0–?)

## 2017-09-01 LAB — BASIC METABOLIC PANEL
BUN: 14 mg/dL (ref 6–23)
CO2: 33 mEq/L — ABNORMAL HIGH (ref 19–32)
Calcium: 9.5 mg/dL (ref 8.4–10.5)
Chloride: 97 mEq/L (ref 96–112)
Creatinine, Ser: 0.95 mg/dL (ref 0.40–1.20)
GFR: 60.1 mL/min (ref >60.00)
GLUCOSE: 162 mg/dL — AB (ref 70–99)
POTASSIUM: 4.8 meq/L (ref 3.5–5.1)
SODIUM: 136 meq/L (ref 135–145)

## 2017-09-01 LAB — HEPATIC FUNCTION PANEL
ALT: 15 U/L (ref 0–35)
AST: 16 U/L (ref 0–37)
Albumin: 4 g/dL (ref 3.5–5.2)
Alkaline Phosphatase: 92 U/L (ref 39–117)
BILIRUBIN TOTAL: 0.5 mg/dL (ref 0.2–1.2)
Bilirubin, Direct: 0.1 mg/dL (ref 0.0–0.3)
Total Protein: 7.4 g/dL (ref 6.0–8.3)

## 2017-09-01 LAB — CBC WITH DIFFERENTIAL/PLATELET
BASOS PCT: 0.9 % (ref 0.0–3.0)
Basophils Absolute: 0.1 10*3/uL (ref 0.0–0.1)
Eosinophils Absolute: 0.2 10*3/uL (ref 0.0–0.7)
Eosinophils Relative: 2.8 % (ref 0.0–5.0)
HCT: 40.6 % (ref 36.0–46.0)
Hemoglobin: 13.3 g/dL (ref 12.0–15.0)
LYMPHS ABS: 2.2 10*3/uL (ref 0.7–4.0)
Lymphocytes Relative: 33.3 % (ref 12.0–46.0)
MCHC: 32.8 g/dL (ref 30.0–36.0)
MCV: 90.6 fl (ref 78.0–100.0)
MONO ABS: 0.8 10*3/uL (ref 0.1–1.0)
Monocytes Relative: 12.1 % — ABNORMAL HIGH (ref 3.0–12.0)
Neutro Abs: 3.3 10*3/uL (ref 1.4–7.7)
Neutrophils Relative %: 50.9 % (ref 43.0–77.0)
PLATELETS: 256 10*3/uL (ref 150.0–400.0)
RBC: 4.48 Mil/uL (ref 3.87–5.11)
RDW: 13.3 % (ref 11.5–15.5)
WBC: 6.5 10*3/uL (ref 4.0–10.5)

## 2017-09-01 LAB — LIPID PANEL
Cholesterol: 140 mg/dL (ref 0–200)
HDL: 44.3 mg/dL (ref >39.00)
LDL Cholesterol: 69 mg/dL (ref 0–99)
NONHDL: 95.28
Total CHOL/HDL Ratio: 3
Triglycerides: 133 mg/dL (ref 0.0–149.0)
VLDL: 26.6 mg/dL (ref 0.0–40.0)

## 2017-09-01 LAB — TSH: TSH: 0.98 u[IU]/mL (ref 0.35–4.50)

## 2017-09-01 LAB — HEMOGLOBIN A1C: Hgb A1c MFr Bld: 8.1 % — ABNORMAL HIGH (ref 4.6–6.5)

## 2017-09-01 NOTE — Progress Notes (Signed)
Subjective:    Patient ID: Kathy Thomas, female    DOB: 08/26/1934, 83 y.o.   MRN: [id number]  HPI  Here for wellness and f/u;  Overall doing ok;  Pt denies Chest pain, worsening SOB, DOE, wheezing, orthopnea, PND, worsening LE edema, palpitations, dizziness or syncope.  Pt denies neurological change such as new headache, facial or extremity weakness.  Pt denies polydipsia, polyuria, or low sugar symptoms. Pt states overall good compliance with treatment and medications, good tolerability, and has been trying to follow appropriate diet.  Pt denies worsening depressive symptoms, suicidal ideation or panic. No fever, night sweats, wt loss, loss of appetite, or other constitutional symptoms.  Pt states fair worsening ability with ADL's, home safety reviewed and adequate, no other significant changes in hearing or vision, and not active with exercise.  Family with her states she has worsening generalized weakness, difficulty with ambulation more and higher risk of fall  No other interval hx or new complaint Past Medical History:  Diagnosis Date  . Acute sinusitis, unspecified 05/28/2008  . ANEMIA-IRON DEFICIENCY 10/26/2008  . ANXIETY 10/06/2007  . CHOLELITHIASIS 10/06/2007  . COLONIC POLYPS, HX OF 01/26/2007  . DIABETES MELLITUS, TYPE II 10/06/2007  . DIVERTICULOSIS, COLON 01/26/2007  . ECCHYMOSES, SPONTANEOUS 06/04/2010  . GERD 01/26/2007  . Heart murmur    slight heart murmur  . HERNIATED DISC 01/26/2007  . HYPERLIPIDEMIA 10/06/2007  . HYPERTENSION 01/26/2007  . LOW BACK PAIN 01/29/2007  . MENOPAUSAL DISORDER 10/26/2008  . OSTEOPOROSIS 06/04/2010  . SHOULDER PAIN, LEFT 10/06/2007  . Stroke [facility])    TIA's   Past Surgical History:  Procedure Laterality Date  . ABDOMINAL HYSTERECTOMY    . APPENDECTOMY    . Fracture left  wrist  2010   Dr. Amanda PeaGramig  . Herniated disk   1995   C-spine  . Left ankle-screw placement  1980  . Left elbow-nerve impingement  1985  . TONSILLECTOMY      reports that she quit  smoking about 4 years ago. Her smoking use included cigarettes. She smoked 0.00 packs per day. She has never used smokeless tobacco. She reports that she does not drink alcohol or use drugs. family history includes Heart disease in her father; Hyperlipidemia in her other; Hypertension in her other; Stroke in her other. Allergies  Allergen Reactions  . Codeine Hives  . Penicillins     REACTION: rash  . Morphine Rash    REACTION: pt unsure of reaction   Current Outpatient Medications on File Prior to Visit  Medication Sig Dispense Refill  . aspirin EC 81 MG tablet Take 1 tablet (81 mg total) by mouth daily. 90 tablet 11  . atorvastatin (LIPITOR) 40 MG tablet TAKE 1 TABLET BY MOUTH EVERY DAY (BEDTIME) 90 tablet 2  . Calcium 1200-1000 MG-UNIT CHEW Chew by mouth daily.      Marland Kitchen. donepezil (ARICEPT) 10 MG tablet TAKE 1 TABLET BY MOUTH EVERYDAY AT BEDTIME 30 tablet 0  . losartan-hydrochlorothiazide (HYZAAR) 100-12.5 MG tablet Take 1 tablet by mouth daily. **PATIENT NEEDS PHYSICAL FOR ADDITIONAL REFILLS** 90 tablet 0  . vitamin B-12 1000 MCG tablet Take 1 tablet (1,000 mcg total) by mouth daily. 30 tablet 1   Current Facility-Administered Medications on File Prior to Visit  Medication Dose Route Frequency Provider Last Rate Last Dose  . 0.9 %  sodium chloride infusion  500 mL Intravenous Continuous Nandigam, Eleonore ChiquitoKavitha V, MD       Review of Systems Constitutional: Negative for other  unusual diaphoresis, sweats, appetite or weight changes HENT: Negative for other worsening hearing loss, ear pain, facial swelling, mouth sores or neck stiffness.   Eyes: Negative for other worsening pain, redness or other visual disturbance.  Respiratory: Negative for other stridor or swelling Cardiovascular: Negative for other palpitations or other chest pain  Gastrointestinal: Negative for worsening diarrhea or loose stools, blood in stool, distention or other pain Genitourinary: Negative for hematuria, flank pain or  other change in urine volume.  Musculoskeletal: Negative for myalgias or other joint swelling.  Skin: Negative for other color change, or other wound or worsening drainage.  Neurological: Negative for other syncope or numbness. Hematological: Negative for other adenopathy or swelling Psychiatric/Behavioral: Negative for hallucinations, other worsening agitation, SI, self-injury, or new decreased concentration All other system neg per pt    Objective:   Physical Exam BP 122/78   Pulse 60   Temp 97.8 F (36.6 C) (Oral)   Ht 5\' 1"  (1.549 m)   Wt 210 lb (95.3 kg)   SpO2 96%   BMI 39.68 kg/m  VS noted,  Constitutional: Pt is oriented to person, place, and time. Appears well-developed and well-nourished, in no significant distress and comfortable Head: Normocephalic and atraumatic  Eyes: Conjunctivae and EOM are normal. Pupils are equal, round, and reactive to light Right Ear: External ear normal without discharge Left Ear: External ear normal without discharge Nose: Nose without discharge or deformity Mouth/Throat: Oropharynx is without other ulcerations and moist  Neck: Normal range of motion. Neck supple. No JVD present. No tracheal deviation present or significant neck LA or mass Cardiovascular: Normal rate, regular rhythm, normal heart sounds and intact distal pulses.   Pulmonary/Chest: WOB normal and breath sounds without rales or wheezing  Abdominal: Soft. Bowel sounds are normal. NT. No HSM  Musculoskeletal: Normal range of motion. Exhibits no edema Lymphadenopathy: Has no other cervical adenopathy.  Neurological: Pt is alert and oriented to person, place, and time. Pt has normal reflexes. No cranial nerve deficit. Motor grossly intact, Gait intact Skin: Skin is warm and dry. No rash noted or new ulcerations Psychiatric:  Has normal mood and affect. Behavior is normal without agitation No other exam findings    Assessment & Plan:

## 2017-09-01 NOTE — Patient Instructions (Addendum)
You will be contacted regarding the referral for: Eye doctor, and Physical Therapy  Please schedule the bone density test before leaving today at the scheduling desk (where you check out)  Please continue all other medications as before, and refills have been done if requested.  Please have the pharmacy call with any other refills you may need.  Please continue your efforts at being more active, low cholesterol diet, and weight control.  You are otherwise up to date with prevention measures today.  Please keep your appointments with your specialists as you may have planned  Please go to the LAB in the Basement (turn left off the elevator) for the tests to be done today  You will be contacted by phone if any changes need to be made immediately.  Otherwise, you will receive a letter about your results with an explanation, but please check with MyChart first.  Please remember to sign up for MyChart if you have not done so, as this will be important to you in the future with finding out test results, communicating by private email, and scheduling acute appointments online when needed.  Please return in 6 months, or sooner if needed, with Lab testing done 3-5 days before

## 2017-09-02 ENCOUNTER — Other Ambulatory Visit: Payer: Self-pay | Admitting: Internal Medicine

## 2017-09-02 ENCOUNTER — Telehealth: Payer: Self-pay

## 2017-09-02 MED ORDER — METFORMIN HCL ER 500 MG PO TB24: ORAL_TABLET | ORAL | 3 refills | Status: DC

## 2017-09-02 NOTE — Telephone Encounter (Signed)
-----   Message from Corwin LevinsJames W John, MD sent at 09/02/2017 12:47 PM EDT ----- Left message on MyChart, pt to cont same tx except  The test results show that your current treatment is OK, except the A1c is moderately elevated.  Please increase the metformin ER 500 mg from 2 to 3 pills in the morning  I will send a new prescription, and you should hear from the office as well.    Shirron to please inform pt, I will do rx

## 2017-09-02 NOTE — Telephone Encounter (Signed)
Pt's daughter has been informed and expressed understanding.  

## 2017-09-03 ENCOUNTER — Telehealth: Payer: Self-pay | Admitting: Internal Medicine

## 2017-09-03 NOTE — Telephone Encounter (Signed)
Copied from CRM [id number]. Topic: Quick Communication - See Telephone Encounter >> Sep 03, 2017  4:55 PM Rudi CocoLathan, Zaeem Kandel M, NT wrote: CRM for notification. See Telephone encounter for: 09/03/17.  Jamie pt. Daughter returning call of helen. Daughter  stated that helen left a vm [phone]

## 2017-09-04 ENCOUNTER — Encounter: Payer: Self-pay | Admitting: Internal Medicine

## 2017-09-04 NOTE — Assessment & Plan Note (Signed)
stable overall by history and exam, recent data reviewed with pt, and pt to continue medical treatment as before,  to f/u any worsening symptoms or concerns, for f/u lab today, optho referral

## 2017-09-04 NOTE — Assessment & Plan Note (Signed)

## 2017-09-04 NOTE — Assessment & Plan Note (Signed)
For PT (boost program)

## 2017-09-07 ENCOUNTER — Ambulatory Visit (INDEPENDENT_AMBULATORY_CARE_PROVIDER_SITE_OTHER)
Admission: RE | Admit: 2017-09-07 | Discharge: 2017-09-07 | Disposition: A | Discharge status: Home / Self Care | Payer: Medicare Other | Source: Ambulatory Visit | Attending: Internal Medicine | Admitting: Internal Medicine

## 2017-09-07 DIAGNOSIS — E2839 Other primary ovarian failure: Secondary | ICD-10-CM | POA: Diagnosis not present

## 2017-09-13 ENCOUNTER — Other Ambulatory Visit: Payer: Self-pay | Admitting: Internal Medicine

## 2017-09-13 ENCOUNTER — Telehealth: Payer: Self-pay

## 2017-09-13 MED ORDER — ALENDRONATE SODIUM 70 MG PO TABS: 70.0000 mg | ORAL_TABLET | ORAL | 3 refills | Status: DC

## 2017-09-13 NOTE — Telephone Encounter (Signed)
Pt's daughter has been informed and expressed understanding.  

## 2017-09-13 NOTE — Telephone Encounter (Signed)
-----   Message from Corwin LevinsJames W John, MD sent at 09/13/2017 12:44 PM EDT ----- Left message on MyChart, pt to cont same tx except  The test results show that your current treatment is OK, except the test is consistent with osteoporosis.  We should start treatment with Fosamax 70 mg weekly.  I will send a prescription, and you should hear from the office as well.Lendell Caprice.    Shirron to please inform pt, I will do rx

## 2017-09-29 ENCOUNTER — Telehealth: Payer: Self-pay | Admitting: Internal Medicine

## 2017-09-29 MED ORDER — DONEPEZIL HCL 10 MG PO TABS: ORAL_TABLET | ORAL | 1 refills | Status: DC

## 2017-09-29 NOTE — Telephone Encounter (Signed)
Copied from CRM [id number]. Topic: Quick Communication - See Telephone Encounter >> Sep 29, 2017  9:59 AM Raquel Sarna wrote: Pt's daughter called stating pharmacy will not fill donepezil (ARICEPT) 10 MG tablet.  Pt has been without it for 2 days now. Please call daughter Freddie Breech) to discuss and get the Rx filled today since pt hasn't taken it in 2 days.

## 2017-09-29 NOTE — Telephone Encounter (Signed)
Notified pt daughter [facility]

## 2017-10-04 DIAGNOSIS — H524 Presbyopia: Secondary | ICD-10-CM | POA: Diagnosis not present

## 2017-12-02 ENCOUNTER — Telehealth: Payer: Self-pay | Admitting: Internal Medicine

## 2017-12-02 MED ORDER — LOSARTAN POTASSIUM-HCTZ 100-12.5 MG PO TABS: 1.0000 | ORAL_TABLET | Freq: Every day | ORAL | 0 refills | Status: DC

## 2017-12-02 NOTE — Telephone Encounter (Signed)
Copied from CRM [phone] [phone]. Topic: Quick Communication - Rx Refill/Question >> Dec 02, 2017 11:25 AM Lorrine KinMcGee, Daune Divirgilio B, NT wrote: Medication: losartan-hydrochlorothiazide (HYZAAR) 100-12.5 MG tablet   Has the patient contacted their pharmacy? Yes.   (Agent: If no, request that the patient contact the pharmacy for the refill.) (Agent: If yes, when and what did the pharmacy advise?)  Preferred Pharmacy (with phone number or street name): [id number] [place] [place] - [address]  Agent: Please be advised that RX refills may take up to 3 business days. We ask that you follow-up with your pharmacy.

## 2017-12-14 DIAGNOSIS — H02831 Dermatochalasis of right upper eyelid: Secondary | ICD-10-CM | POA: Diagnosis not present

## 2017-12-14 DIAGNOSIS — H2513 Age-related nuclear cataract, bilateral: Secondary | ICD-10-CM | POA: Diagnosis not present

## 2017-12-14 DIAGNOSIS — H25043 Posterior subcapsular polar age-related cataract, bilateral: Secondary | ICD-10-CM | POA: Diagnosis not present

## 2017-12-14 DIAGNOSIS — H25013 Cortical age-related cataract, bilateral: Secondary | ICD-10-CM | POA: Diagnosis not present

## 2018-01-03 DIAGNOSIS — H2511 Age-related nuclear cataract, right eye: Secondary | ICD-10-CM | POA: Diagnosis not present

## 2018-01-04 DIAGNOSIS — H2512 Age-related nuclear cataract, left eye: Secondary | ICD-10-CM | POA: Diagnosis not present

## 2018-02-07 DIAGNOSIS — H2512 Age-related nuclear cataract, left eye: Secondary | ICD-10-CM | POA: Diagnosis not present

## 2018-02-24 ENCOUNTER — Other Ambulatory Visit: Payer: Self-pay | Admitting: Internal Medicine

## 2018-03-07 ENCOUNTER — Other Ambulatory Visit (INDEPENDENT_AMBULATORY_CARE_PROVIDER_SITE_OTHER): Payer: Medicare Other

## 2018-03-07 ENCOUNTER — Telehealth: Payer: Self-pay

## 2018-03-07 ENCOUNTER — Ambulatory Visit (INDEPENDENT_AMBULATORY_CARE_PROVIDER_SITE_OTHER): Payer: Medicare Other | Admitting: Internal Medicine

## 2018-03-07 ENCOUNTER — Encounter: Payer: Self-pay | Admitting: Internal Medicine

## 2018-03-07 VITALS — BP 126/78 | HR 67 | Temp 98.1°F | Ht 61.0 in | Wt 192.0 lb

## 2018-03-07 DIAGNOSIS — E119 Type 2 diabetes mellitus without complications: Secondary | ICD-10-CM

## 2018-03-07 DIAGNOSIS — Z23 Encounter for immunization: Secondary | ICD-10-CM | POA: Diagnosis not present

## 2018-03-07 DIAGNOSIS — R197 Diarrhea, unspecified: Secondary | ICD-10-CM | POA: Diagnosis not present

## 2018-03-07 DIAGNOSIS — I10 Essential (primary) hypertension: Secondary | ICD-10-CM

## 2018-03-07 DIAGNOSIS — R269 Unspecified abnormalities of gait and mobility: Secondary | ICD-10-CM | POA: Diagnosis not present

## 2018-03-07 DIAGNOSIS — R634 Abnormal weight loss: Secondary | ICD-10-CM

## 2018-03-07 DIAGNOSIS — E785 Hyperlipidemia, unspecified: Secondary | ICD-10-CM

## 2018-03-07 LAB — HEMOGLOBIN A1C: HEMOGLOBIN A1C: 6.5 % (ref 4.6–6.5)

## 2018-03-07 MED ORDER — DIPHENOXYLATE-ATROPINE 2.5-0.025 MG PO TABS: 1.0000 | ORAL_TABLET | Freq: Four times a day (QID) | ORAL | 0 refills | Status: DC | PRN

## 2018-03-07 MED ORDER — GLUCOSE BLOOD VI STRP: ORAL_STRIP | 12 refills | Status: DC

## 2018-03-07 MED ORDER — ONETOUCH ULTRA 2 W/DEVICE KIT: PACK | 0 refills | Status: DC

## 2018-03-07 MED ORDER — LANCETS MISC: 11 refills | Status: DC

## 2018-03-07 MED ORDER — PIOGLITAZONE HCL 30 MG PO TABS: 30.0000 mg | ORAL_TABLET | Freq: Every day | ORAL | 3 refills | Status: DC

## 2018-03-07 NOTE — Assessment & Plan Note (Addendum)
Ok for rolling walker asd, also refer for outpt PT

## 2018-03-07 NOTE — Assessment & Plan Note (Signed)
stable overall by history and exam, recent data reviewed with pt, and pt to continue medical treatment as before,  to f/u any worsening symptoms or concerns  

## 2018-03-07 NOTE — Assessment & Plan Note (Signed)
Stable but cant r/o diarrhea related to metformin, to change metformin to actos 30 qd

## 2018-03-07 NOTE — Assessment & Plan Note (Signed)
Etiology unclear, to continue improved diet,  to f/u any worsening symptoms or concerns

## 2018-03-07 NOTE — Telephone Encounter (Signed)
Pt is coming in for appt today. Will be addressed at OV   Copied from CRM [id number]. Topic: General - Other >> Mar 07, 2018  1:06 PM Gaynelle Adu wrote: Reason for CRM: Patient daughter [name] is requesting a Dan Humphreys with a seat due to the patient not able to walk for a long period of time.
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
## 2018-03-07 NOTE — Patient Instructions (Addendum)
Ok to stop the metformin  Please take all new medication as prescribed - the actos, and the lomotil  You are given the [employer] rx and Handicapped parking application signed  Your new meter and supplies were sent to the pharmacy  Please call if sugars are still more than 200 in 2-3 weeks  Please continue all other medications as before, and refills have been done if requested.  Please have the pharmacy call with any other refills you may need.  Please continue your efforts at being more active, low cholesterol diet, and weight control.  You will be contacted regarding the referral for: Physical Therapy, and Gastroenterology  Please keep your appointments with your specialists as you may have planned  Please go to the LAB in the Basement (turn left off the elevator) for the tests to be done today  You will be contacted by phone if any changes need to be made immediately.  Otherwise, you will receive a letter about your results with an explanation, but please check with MyChart first.  Please remember to sign up for MyChart if you have not done so, as this will be important to you in the future with finding out test results, communicating by private email, and scheduling acute appointments online when needed.  Please return in 3 months, or sooner if needed

## 2018-03-07 NOTE — Assessment & Plan Note (Addendum)
Etiology unclear, for stool studies, lomotil prn, referral per daughter request  Note:  Total time for pt hx, exam, review of record with pt in the room, determination of diagnoses and plan for further eval and tx is > 40 min, with over 50% spent in coordination and counseling of patient including the differential dx, tx, further evaluation and other management of diarrhea, gait disorder, DM, HTN, HLD, and wt loss

## 2018-03-08 LAB — BASIC METABOLIC PANEL
BUN: 35 mg/dL — ABNORMAL HIGH (ref 6–23)
CALCIUM: 9.3 mg/dL (ref 8.4–10.5)
CO2: 34 mEq/L — ABNORMAL HIGH (ref 19–32)
Chloride: 90 mEq/L — ABNORMAL LOW (ref 96–112)
Creatinine, Ser: 1.05 mg/dL (ref 0.40–1.20)
GFR: 53.47 mL/min — AB (ref >60.00)
GLUCOSE: 102 mg/dL — AB (ref 70–99)
Potassium: 4.1 mEq/L (ref 3.5–5.1)
SODIUM: 131 meq/L — AB (ref 135–145)

## 2018-03-08 LAB — LIPID PANEL
Cholesterol: 113 mg/dL (ref 0–200)
HDL: 34.7 mg/dL — ABNORMAL LOW (ref >39.00)
LDL Cholesterol: 52 mg/dL (ref 0–99)
NONHDL: 78.57
TRIGLYCERIDES: 132 mg/dL (ref 0.0–149.0)
Total CHOL/HDL Ratio: 3
VLDL: 26.4 mg/dL (ref 0.0–40.0)

## 2018-03-08 LAB — HEPATIC FUNCTION PANEL
ALBUMIN: 4 g/dL (ref 3.5–5.2)
ALT: 12 U/L (ref 0–35)
AST: 14 U/L (ref 0–37)
Alkaline Phosphatase: 67 U/L (ref 39–117)
BILIRUBIN TOTAL: 0.4 mg/dL (ref 0.2–1.2)
Bilirubin, Direct: 0 mg/dL (ref 0.0–0.3)
Total Protein: 7.2 g/dL (ref 6.0–8.3)

## 2018-03-11 ENCOUNTER — Other Ambulatory Visit: Payer: Medicare Other

## 2018-03-11 DIAGNOSIS — R197 Diarrhea, unspecified: Secondary | ICD-10-CM | POA: Diagnosis not present

## 2018-03-14 ENCOUNTER — Other Ambulatory Visit: Payer: Self-pay | Admitting: Internal Medicine

## 2018-03-14 ENCOUNTER — Telehealth: Payer: Self-pay | Admitting: Internal Medicine

## 2018-03-14 MED ORDER — LOSARTAN POTASSIUM-HCTZ 100-12.5 MG PO TABS: 1.0000 | ORAL_TABLET | Freq: Every day | ORAL | 0 refills | Status: DC

## 2018-03-14 MED ORDER — DONEPEZIL HCL 10 MG PO TABS: ORAL_TABLET | ORAL | 1 refills | Status: DC

## 2018-03-14 NOTE — Telephone Encounter (Signed)
Copied from CRM [id number]. Topic: Quick Communication - Rx Refill/Question >> Mar 14, 2018  2:52 PM Jens Som A wrote: Medication: donepezil (ARICEPT) 10 MG tablet [[id number]] losartan-hydrochlorothiazide (HYZAAR) 100-12.5 MG tablet [[id number]]   Has the patient contacted their pharmacy? Yes  (Agent: If no, request that the patient contact the pharmacy for the refill.) (Agent: If yes, when and what did the pharmacy advise?)  Preferred Pharmacy (with phone number or street name): [id number] [place], [place] - [address] [facility] [place] [address] Phone: [phone] Fax: [phone]    Agent: Please be advised that RX refills may take up to 3 business days. We ask that you follow-up with your pharmacy.

## 2018-03-14 NOTE — Telephone Encounter (Signed)
Duplicate encounter

## 2018-03-14 NOTE — Telephone Encounter (Signed)
Unknown if we can write this. Please advise   A payee is a representative payee, or substitute payee, is a person who acts as the receiver of [place] Social Security Disability or [employer] Income for a person who is not fully capable of managing their own benefits, i.e. cannot be their own payee.

## 2018-03-14 NOTE — Telephone Encounter (Signed)
Copied from CRM [id number]. Topic: Quick Communication - See Telephone Encounter >> Mar 14, 2018  2:54 PM Jens Som A wrote: CRM for notification. See Telephone encounter for: 03/14/18.  Patient's Daughter Sherren Kerns also POA ,  is requesting a letter for Social Security stating that she needs a payee for her mom.  Marijean Niemann will pick letter. Patients daughter call back [phone]

## 2018-03-14 NOTE — Telephone Encounter (Incomplete)
Copied from CRM #[id number]. Topic: Quick Communication - Rx Refill/Question °>> Mar 14, 2018  2:45 PM Medley, [name] A wrote: °Medication: ***losartan-hydrochlorothiazide (HYZAAR) 100-12.5 MG tablet [[id number]] , donepezil (ARICEPT) 10 MG tablet [[id number]]  ° °Has the patient contacted their pharmacy? Yes °(Agent: If no, request that the patient contact the pharmacy for the refill.) °(Agent: If yes, when and what did the pharmacy advise?) ° °Preferred Pharmacy (with phone number or street name):[id number] [place], [place] - [address] °[address] [place] [address] °Phone: [phone] Fax: [phone] ° ° ° °Agent: Please be advised that RX refills may take up to 3 business days. We ask that you follow-up with your pharmacy. °

## 2018-03-14 NOTE — Telephone Encounter (Incomplete)
Copied from CRM [id number]. Topic: Quick Communication - Rx Refill/Question >> Mar 14, 2018  2:45 PM Jens Som A wrote: Medication: ***losartan-hydrochlorothiazide (HYZAAR) 100-12.5 MG tablet [[id number]] , donepezil (ARICEPT) 10 MG tablet [[id number]]   Has the patient contacted their pharmacy? Yes (Agent: If no, request that the patient contact the pharmacy for the refill.) (Agent: If yes, when and what did the pharmacy advise?)  Preferred Pharmacy (with phone number or street name):[id number] [place], [place] - [address] [facility] [place] [address] Phone: [phone] Fax: [phone]    Agent: Please be advised that RX refills may take up to 3 business days. We ask that you follow-up with your pharmacy.

## 2018-03-14 NOTE — Telephone Encounter (Signed)
Kathy Thomas please advise. Is this something that PCP could get involved with?

## 2018-03-14 NOTE — Telephone Encounter (Signed)
Requested Prescriptions  Pending Prescriptions Disp Refills  . donepezil (ARICEPT) 10 MG tablet 90 tablet 1    Sig: TAKE 1 TABLET BY MOUTH EVERYDAY AT BEDTIME     Neurology:  Alzheimer's Agents Passed - 03/14/2018  2:58 PM      Passed - Valid encounter within last 6 months    Recent Outpatient Visits          1 week ago Diarrhea, unspecified type   [facility] -Clair Gulling, MD   6 months ago Preventative health care   [facility] -Clair Gulling, MD   11 months ago Acute right ankle pain   [facility] -Parke Simmers, MD   1 year ago Type 2 diabetes mellitus without complication, without long-term current use of insulin [facility])   [facility] -Clair Gulling, MD   2 years ago Encounter for well adult exam with abnormal findings   [facility] -Clair Gulling, MD           . losartan-hydrochlorothiazide (HYZAAR) 100-12.5 MG tablet 90 tablet 0    Sig: Take 1 tablet by mouth daily.     Cardiovascular: ARB + Diuretic Combos Failed - 03/14/2018  2:58 PM      Failed - Na in normal range and within 180 days    Sodium  Date Value Ref Range Status  03/07/2018 131 (L) 135 - 145 mEq/L Final         Passed - K in normal range and within 180 days    Potassium  Date Value Ref Range Status  03/07/2018 4.1 3.5 - 5.1 mEq/L Final         Passed - Cr in normal range and within 180 days    Creatinine, Ser  Date Value Ref Range Status  03/07/2018 1.05 0.40 - 1.20 mg/dL Final         Passed - Ca in normal range and within 180 days    Calcium  Date Value Ref Range Status  03/07/2018 9.3 8.4 - 10.5 mg/dL Final         Passed - Patient is not pregnant      Passed - Last BP in normal range    BP Readings from Last 1 Encounters:  03/07/18 126/78         Passed - Valid encounter within last 6 months    Recent Outpatient Visits          1 week ago Diarrhea,  unspecified type   [facility] -Clair Gulling, MD   6 months ago Preventative health care   [facility] -Clair Gulling, MD   11 months ago Acute right ankle pain   [facility] -Parke Simmers, MD   1 year ago Type 2 diabetes mellitus without complication, without long-term current use of insulin [facility])   [facility] -Clair Gulling, MD   2 years ago Encounter for well adult exam with abnormal findings   [facility] -Clair Gulling, MD

## 2018-03-15 NOTE — Telephone Encounter (Signed)
[name] calling and to check the status of this letter. Please advise. Would like a call once this is complete.

## 2018-03-16 LAB — OVA AND PARASITE EXAMINATION
CONCENTRATE RESULT:: NONE SEEN
SPECIMEN QUALITY:: ADEQUATE
TRICHROME RESULT:: NONE SEEN
VKL: 91255516

## 2018-03-16 LAB — STOOL CULTURE
MICRO NUMBER: 91255514
MICRO NUMBER: 91255515
MICRO NUMBER:: 91255517
SHIGA RESULT: NOT DETECTED
SPECIMEN QUALITY: ADEQUATE
SPECIMEN QUALITY: ADEQUATE
SPECIMEN QUALITY:: ADEQUATE

## 2018-03-16 LAB — GASTROINTESTINAL PATHOGEN PANEL PCR
C. difficile Tox A/B, PCR: NOT DETECTED
Campylobacter, PCR: NOT DETECTED
Cryptosporidium, PCR: NOT DETECTED
E COLI (ETEC) LT/ST, PCR: NOT DETECTED
E COLI (STEC) STX1/STX2, PCR: NOT DETECTED
E coli 0157, PCR: NOT DETECTED
Giardia lamblia, PCR: NOT DETECTED
Norovirus, PCR: NOT DETECTED
Rotavirus A, PCR: NOT DETECTED
SALMONELLA, PCR: NOT DETECTED
Shigella, PCR: NOT DETECTED

## 2018-03-16 NOTE — Telephone Encounter (Signed)
Spoke with patient's daughter,Jamie. Advised we were working on the possibility of getting the letter completed in Dr. Raphael Gibney absence and I will get back to her.
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
## 2018-03-18 ENCOUNTER — Other Ambulatory Visit: Payer: Self-pay | Admitting: Internal Medicine

## 2018-03-21 ENCOUNTER — Encounter: Payer: Self-pay | Admitting: Family

## 2018-03-22 NOTE — Telephone Encounter (Signed)
Called Kathy Thomas to inform her letter is ready for pick up, left msg to give me a call back.

## 2018-03-23 ENCOUNTER — Other Ambulatory Visit: Payer: Self-pay

## 2018-03-23 MED ORDER — LOSARTAN POTASSIUM 100 MG PO TABS: ORAL_TABLET | ORAL | 1 refills | Status: DC

## 2018-03-23 NOTE — Telephone Encounter (Signed)
Informed daughter letter is ready for pick up.  At front desk.

## 2018-03-23 NOTE — Telephone Encounter (Signed)
Sig was clarified and sent to the pharmacy to be filled.

## 2018-03-24 ENCOUNTER — Ambulatory Visit: Payer: Medicare Other | Admitting: Physician Assistant

## 2018-03-24 ENCOUNTER — Encounter: Payer: Self-pay | Admitting: Physician Assistant

## 2018-03-24 VITALS — BP 118/64 | HR 58 | Ht 61.0 in | Wt 189.4 lb

## 2018-03-24 DIAGNOSIS — R197 Diarrhea, unspecified: Secondary | ICD-10-CM

## 2018-03-24 MED ORDER — DICYCLOMINE HCL 20 MG PO TABS: 20.0000 mg | ORAL_TABLET | Freq: Two times a day (BID) | ORAL | 5 refills | Status: DC

## 2018-03-24 NOTE — Patient Instructions (Addendum)
If you are age 81 or older, your body mass index should be between 23-30. Your Body mass index is 35.79 kg/m. If this is out of the aforementioned range listed, please consider follow up with your Primary Care Provider.  If you are age 86 or younger, your body mass index should be between 19-25. Your Body mass index is 35.79 kg/m. If this is out of the aformentioned range listed, please consider follow up with your Primary Care Provider.   We have sent the following medications to your pharmacy for you to pick up at your convenience: Dicyclomine 20mg : take twice a day  Please call or send a message through MyChart in 2 weeks and give [place] an update on how you are doing.  Thank you for choosing [facility] and for entrusting me with your care, Hyacinth Meeker, PA-C
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
## 2018-03-29 ENCOUNTER — Telehealth: Payer: Self-pay | Admitting: Physician Assistant

## 2018-03-29 NOTE — Telephone Encounter (Signed)
LM for the daughter of patient, Kathy Thomas ,mobile # [phone]. LM for daughter that [employer] will not approve the Dicyclomine 20 mg for the diagnosis of diarrhea.  Asked Kathy Thomas to call me back.

## 2018-03-29 NOTE — Telephone Encounter (Signed)
[employer] states that medication dicyclomine was denied because dx given is not FDA medically effective use

## 2018-03-29 NOTE — Telephone Encounter (Signed)
Called the pharmacy, [facility] [place], [place]. I asked if the patient was able to get the medication, Dicyclomine 20 mg.  The pharmacist said she was able to get it with a discount card the pharmacy had.  I called and left another message for the daughter Olegario Messier and told her to ignore my previous message from today. I advised I was glad her mother was able to get the Dicyclomine 20 mg tablets. I asked her to let us know how her mother is doing.

## 2018-04-04 NOTE — Progress Notes (Signed)
Reviewed and agree with documentation and assessment and plan. K. Veena Nandigam , MD   

## 2018-09-27 ENCOUNTER — Other Ambulatory Visit: Payer: Self-pay | Admitting: Internal Medicine

## 2018-09-27 NOTE — Telephone Encounter (Signed)
Ok to hold diovan, as this was changed to losartan with refill just done recently

## 2018-10-16 ENCOUNTER — Other Ambulatory Visit: Payer: Self-pay | Admitting: Internal Medicine

## 2018-10-18 ENCOUNTER — Telehealth: Payer: Self-pay

## 2018-10-18 ENCOUNTER — Other Ambulatory Visit: Payer: Self-pay | Admitting: Internal Medicine

## 2018-10-18 MED ORDER — LOSARTAN POTASSIUM 25 MG PO TABS: ORAL_TABLET | ORAL | 3 refills | Status: DC

## 2018-10-18 NOTE — Telephone Encounter (Signed)
Copied from CRM [id number]. Topic: General - Other >> Oct 18, 2018  3:39 PM Jaquita Rector A wrote: Reason for CRM: [facility] to say that the patient insurance will not cover the losartan (COZAAR) 25 MG tablet because of the number of pills requested. Asking for an alternative because they do not have the 50 or the 100 mg. Call back # [phone]

## 2018-10-18 NOTE — Telephone Encounter (Signed)
ok to change to losartan 25 mg - 4 per day

## 2018-10-18 NOTE — Telephone Encounter (Signed)
Per pharmacy:  Product Backordered/Unavailable:LOSARTAN 100 MG ON BACKORDER. PLEASE SWITCH TO ANOTHER MEDICATION.

## 2018-10-19 MED ORDER — TELMISARTAN 80 MG PO TABS: 80.0000 mg | ORAL_TABLET | Freq: Every day | ORAL | 3 refills | Status: DC

## 2018-10-19 NOTE — Addendum Note (Signed)
Addended by: [name] on: 10/19/2018 08:34 PM   Modules accepted: Orders

## 2018-10-19 NOTE — Telephone Encounter (Signed)
Ok to change to micardis 80 mg - 1 per day done erx

## 2018-10-29 ENCOUNTER — Other Ambulatory Visit: Payer: Self-pay | Admitting: Internal Medicine

## 2018-12-20 ENCOUNTER — Ambulatory Visit (INDEPENDENT_AMBULATORY_CARE_PROVIDER_SITE_OTHER): Payer: Medicare Other | Admitting: Podiatry

## 2018-12-20 ENCOUNTER — Encounter: Payer: Self-pay | Admitting: Podiatry

## 2018-12-20 ENCOUNTER — Other Ambulatory Visit: Payer: Self-pay

## 2018-12-20 DIAGNOSIS — E119 Type 2 diabetes mellitus without complications: Secondary | ICD-10-CM | POA: Insufficient documentation

## 2018-12-20 DIAGNOSIS — B351 Tinea unguium: Secondary | ICD-10-CM | POA: Diagnosis not present

## 2018-12-20 DIAGNOSIS — M79674 Pain in right toe(s): Secondary | ICD-10-CM

## 2018-12-20 DIAGNOSIS — M79675 Pain in left toe(s): Secondary | ICD-10-CM | POA: Diagnosis not present

## 2018-12-20 NOTE — Progress Notes (Signed)
This patient presents to the office with chief complaint of long thick nails and diabetic feet.  Patient presents to the office with her daughter.  This patient  says there  is  no pain and discomfort in her  feet.  This patient says there are long thick painful nails especially her toenail on her left big toe.  Patient has no history of infection or drainage from both feet.  Patient is unable to  self treat his own nails . This patient presents  to the office today for treatment of the  long nails and a foot evaluation due to history of  Diabetes.  Patient has dementia.   General Appearance  Alert, conversant and in no acute stress.  Vascular  Dorsalis pedis and posterior tibial  pulses are palpable  bilaterally.  Capillary return is within normal limits  bilaterally. Temperature is within normal limits  bilaterally.  Neurologic  Senn-Weinstein monofilament wire test within normal limits  bilaterally. Muscle power within normal limits bilaterally.  Nails Thick disfigured discolored nails with subungual debris  from hallux to fifth toes bilaterally.  Her left hallux nail is thick and growing abnormally. No evidence of bacterial infection or drainage bilaterally.  Orthopedic  No limitations of motion of motion feet .  No crepitus or effusions noted.  No bony pathology or digital deformities noted.  Skin  normotropic skin with no porokeratosis noted bilaterally.  No signs of infections or ulcers noted.     Onychomycosis  Diabetes with no foot complications  IE  Debride nails x 10.  A diabetic foot exam was performed and there is no evidence of any vascular or neurologic pathology.  In the middle of providing nail services for this patient the daughter arose out of her chair.  She came over and examined the nails.  She proceeded to tell me that her mothers  smaller  nails were still ingrown.  She then told me that the nail on the left big toe at its base was still thick.  She then said that she thought  the nail would be removed and not trimmed.  Then she said that the nails on the right foot were not growing straight out but were growing at an angle.  I then proceeded to finish working on her mother's nails.  I told her that if she is interested in having the left great toenail   removed permanently she needs to make appointment with the podiatric  doctors on the medical side of the office.  I proceeded to ask her mother if she was experiencing any pain or discomfort.  Her mother said there was no pain or discomfort in her nails nails.  I told the daughter that I thought in the best interest of her mother's care being that she was diabetic and had Dementia  and was having no pain that she should return to the office for nail care  in 3 months and I recommended against surgery for the nail removal.   Gardiner Barefoot DPM

## 2019-01-29 ENCOUNTER — Other Ambulatory Visit: Payer: Self-pay | Admitting: Internal Medicine

## 2019-02-04 ENCOUNTER — Other Ambulatory Visit: Payer: Self-pay | Admitting: Internal Medicine

## 2019-02-21 ENCOUNTER — Ambulatory Visit (INDEPENDENT_AMBULATORY_CARE_PROVIDER_SITE_OTHER): Payer: Medicare Other | Admitting: Internal Medicine

## 2019-02-21 ENCOUNTER — Other Ambulatory Visit: Payer: Self-pay | Admitting: Internal Medicine

## 2019-02-21 ENCOUNTER — Other Ambulatory Visit (INDEPENDENT_AMBULATORY_CARE_PROVIDER_SITE_OTHER): Payer: Medicare Other

## 2019-02-21 ENCOUNTER — Encounter: Payer: Medicare Other | Admitting: Internal Medicine

## 2019-02-21 ENCOUNTER — Other Ambulatory Visit: Payer: Self-pay

## 2019-02-21 ENCOUNTER — Encounter: Payer: Self-pay | Admitting: Internal Medicine

## 2019-02-21 VITALS — BP 126/82 | HR 60 | Temp 97.6°F | Ht 61.0 in | Wt 208.0 lb

## 2019-02-21 DIAGNOSIS — E559 Vitamin D deficiency, unspecified: Secondary | ICD-10-CM

## 2019-02-21 DIAGNOSIS — E119 Type 2 diabetes mellitus without complications: Secondary | ICD-10-CM

## 2019-02-21 DIAGNOSIS — E538 Deficiency of other specified B group vitamins: Secondary | ICD-10-CM

## 2019-02-21 DIAGNOSIS — Z0000 Encounter for general adult medical examination without abnormal findings: Secondary | ICD-10-CM

## 2019-02-21 DIAGNOSIS — E611 Iron deficiency: Secondary | ICD-10-CM | POA: Diagnosis not present

## 2019-02-21 DIAGNOSIS — Z23 Encounter for immunization: Secondary | ICD-10-CM

## 2019-02-21 DIAGNOSIS — N289 Disorder of kidney and ureter, unspecified: Secondary | ICD-10-CM

## 2019-02-21 LAB — VITAMIN B12: Vitamin B-12: 347 pg/mL (ref 211–911)

## 2019-02-21 LAB — CBC WITH DIFFERENTIAL/PLATELET
Basophils Absolute: 0 10*3/uL (ref 0.0–0.1)
Basophils Relative: 0.8 % (ref 0.0–3.0)
Eosinophils Absolute: 0.2 10*3/uL (ref 0.0–0.7)
Eosinophils Relative: 3.2 % (ref 0.0–5.0)
HCT: 42.2 % (ref 36.0–46.0)
Hemoglobin: 13.6 g/dL (ref 12.0–15.0)
Lymphocytes Relative: 28.4 % (ref 12.0–46.0)
Lymphs Abs: 1.7 10*3/uL (ref 0.7–4.0)
MCHC: 32.4 g/dL (ref 30.0–36.0)
MCV: 93.5 fl (ref 78.0–100.0)
Monocytes Absolute: 0.8 10*3/uL (ref 0.1–1.0)
Monocytes Relative: 14.2 % — ABNORMAL HIGH (ref 3.0–12.0)
Neutro Abs: 3.2 10*3/uL (ref 1.4–7.7)
Neutrophils Relative %: 53.4 % (ref 43.0–77.0)
Platelets: 179 10*3/uL (ref 150.0–400.0)
RBC: 4.51 Mil/uL (ref 3.87–5.11)
RDW: 14.9 % (ref 11.5–15.5)
WBC: 5.9 10*3/uL (ref 4.0–10.5)

## 2019-02-21 LAB — VITAMIN D 25 HYDROXY (VIT D DEFICIENCY, FRACTURES): VITD: 29.62 ng/mL — ABNORMAL LOW (ref 30.00–100.00)

## 2019-02-21 LAB — IBC PANEL
Iron: 55 ug/dL (ref 42–145)
Saturation Ratios: 15.1 % — ABNORMAL LOW (ref 20.0–50.0)
Transferrin: 260 mg/dL (ref 212.0–360.0)

## 2019-02-21 LAB — BASIC METABOLIC PANEL
BUN: 27 mg/dL — ABNORMAL HIGH (ref 6–23)
CO2: 32 mEq/L (ref 19–32)
Calcium: 9.6 mg/dL (ref 8.4–10.5)
Chloride: 99 mEq/L (ref 96–112)
Creatinine, Ser: 1.13 mg/dL (ref 0.40–1.20)
GFR: 46.11 mL/min — ABNORMAL LOW (ref >60.00)
Glucose, Bld: 91 mg/dL (ref 70–99)
Potassium: 5.2 mEq/L — ABNORMAL HIGH (ref 3.5–5.1)
Sodium: 138 mEq/L (ref 135–145)

## 2019-02-21 LAB — HEPATIC FUNCTION PANEL
ALT: 10 U/L (ref 0–35)
AST: 14 U/L (ref 0–37)
Albumin: 3.9 g/dL (ref 3.5–5.2)
Alkaline Phosphatase: 88 U/L (ref 39–117)
Bilirubin, Direct: 0 mg/dL (ref 0.0–0.3)
Total Bilirubin: 0.4 mg/dL (ref 0.2–1.2)
Total Protein: 7.3 g/dL (ref 6.0–8.3)

## 2019-02-21 LAB — LIPID PANEL
Cholesterol: 137 mg/dL (ref 0–200)
HDL: 44.7 mg/dL (ref >39.00)
LDL Cholesterol: 74 mg/dL (ref 0–99)
NonHDL: 92.7
Total CHOL/HDL Ratio: 3
Triglycerides: 93 mg/dL (ref 0.0–149.0)
VLDL: 18.6 mg/dL (ref 0.0–40.0)

## 2019-02-21 LAB — TSH: TSH: 1.8 u[IU]/mL (ref 0.35–4.50)

## 2019-02-21 LAB — HEMOGLOBIN A1C: Hgb A1c MFr Bld: 6.5 % (ref 4.6–6.5)

## 2019-02-21 MED ORDER — VITAMIN D (ERGOCALCIFEROL) 1.25 MG (50000 UNIT) PO CAPS: 50000.0000 [IU] | ORAL_CAPSULE | ORAL | 0 refills | Status: DC

## 2019-02-21 MED ORDER — DONEPEZIL HCL 10 MG PO TABS: ORAL_TABLET | ORAL | 3 refills | Status: DC

## 2019-02-21 MED ORDER — PIOGLITAZONE HCL 30 MG PO TABS: 30.0000 mg | ORAL_TABLET | Freq: Every day | ORAL | 3 refills | Status: DC

## 2019-02-21 MED ORDER — ZOSTER VAC RECOMB ADJUVANTED 50 MCG/0.5ML IM SUSR: 0.5000 mL | Freq: Once | INTRAMUSCULAR | 1 refills | Status: AC

## 2019-02-21 MED ORDER — ATORVASTATIN CALCIUM 40 MG PO TABS: 40.0000 mg | ORAL_TABLET | Freq: Every day | ORAL | 3 refills | Status: DC

## 2019-02-21 MED ORDER — ALENDRONATE SODIUM 70 MG PO TABS: ORAL_TABLET | ORAL | 3 refills | Status: DC

## 2019-02-21 NOTE — Progress Notes (Signed)
Subjective:    Patient ID: Kathy Thomas, female    DOB: Apr 10, 1935, 84 y.o.   MRN: [id number]  HPI  Here for wellness and f/u with daughter who gives hx;  Overall doing ok;  Pt denies Chest pain, worsening SOB, DOE, wheezing, orthopnea, PND, worsening LE edema, palpitations, dizziness or syncope.  Pt denies neurological change such as new headache, facial or extremity weakness.  Pt denies polydipsia, polyuria, or low sugar symptoms. Pt states overall good compliance with treatment and medications, good tolerability, and has been trying to follow appropriate diet.  Pt denies worsening depressive symptoms, suicidal ideation or panic. No fever, night sweats, wt loss, loss of appetite, or other constitutional symptoms.  Pt states good ability with ADL's, has low fall risk, home safety reviewed and adequate, no other significant changes in hearing or vision, walks little at home with walker.  Has gained wt now with less food insecurity living with daughter Wt Readings from Last 3 Encounters:  02/21/19 208 lb (94.3 kg)  03/24/18 189 lb 6.4 oz (85.9 kg)  03/07/18 192 lb (87.1 kg)   Past Medical History:  Diagnosis Date  . Acute sinusitis, unspecified 05/28/2008  . ANEMIA-IRON DEFICIENCY 10/26/2008  . ANXIETY 10/06/2007  . CHOLELITHIASIS 10/06/2007  . COLONIC POLYPS, HX OF 01/26/2007  . DIABETES MELLITUS, TYPE II 10/06/2007  . DIVERTICULOSIS, COLON 01/26/2007  . ECCHYMOSES, SPONTANEOUS 06/04/2010  . GERD 01/26/2007  . Heart murmur    slight heart murmur  . HERNIATED DISC 01/26/2007  . HYPERLIPIDEMIA 10/06/2007  . HYPERTENSION 01/26/2007  . LOW BACK PAIN 01/29/2007  . MENOPAUSAL DISORDER 10/26/2008  . OSTEOPOROSIS 06/04/2010  . SHOULDER PAIN, LEFT 10/06/2007  . Stroke [facility])    TIA's   Past Surgical History:  Procedure Laterality Date  . ABDOMINAL HYSTERECTOMY    . APPENDECTOMY    . Fracture left  wrist  2010   Dr. Amedeo Plenty  . Herniated disk   1995   C-spine  . Left ankle-screw placement  1980  . Left  elbow-nerve impingement  1985  . TONSILLECTOMY      reports that she quit smoking about 5 years ago. Her smoking use included cigarettes. She smoked 0.00 packs per day. She has never used smokeless tobacco. She reports that she does not drink alcohol or use drugs. family history includes Heart disease in her father; Hyperlipidemia in an other family member; Hypertension in an other family member; Stroke in an other family member. Allergies  Allergen Reactions  . Codeine Hives  . Penicillins     REACTION: rash  . Morphine Rash    REACTION: pt unsure of reaction   Current Outpatient Medications on File Prior to Visit  Medication Sig Dispense Refill  . alendronate (FOSAMAX) 70 MG tablet TAKE 1 TABLET BY MOUTH EVERY 7 DAYS. TAKE WITH A FULL GLASS OF WATER ON AN EMPTY STOMACH. 12 tablet 0  . aspirin EC 81 MG tablet Take 1 tablet (81 mg total) by mouth daily. 90 tablet 11  . atorvastatin (LIPITOR) 40 MG tablet TAKE 1 TABLET BY MOUTH EVERY DAY AT BEDTIME 90 tablet 0  . Blood Glucose Monitoring Suppl (ONE TOUCH ULTRA 2) w/Device KIT Use as directed twice per day E11.9 1 each 0  . Calcium 1200-1000 MG-UNIT CHEW Chew by mouth daily.      Marland Kitchen dicyclomine (BENTYL) 20 MG tablet Take 1 tablet (20 mg total) by mouth 2 (two) times daily. 60 tablet 5  . diphenoxylate-atropine (LOMOTIL) 2.5-0.025 MG tablet  Take 1 tablet by mouth 4 (four) times daily as needed for diarrhea or loose stools. 40 tablet 0  . donepezil (ARICEPT) 10 MG tablet TAKE 1 TABLET BY MOUTH EVERYDAY AT BEDTIME 90 tablet 1  . glucose blood (ONE TOUCH ULTRA TEST) test strip Use as instructed twice per day E11.9 200 each 12  . Lancets MISC Use as directed twice per day E11.9 200 each 11  . losartan (COZAAR) 100 MG tablet Take 50 mg by mouth daily.    . pioglitazone (ACTOS) 30 MG tablet Take 1 tablet (30 mg total) by mouth daily. 90 tablet 3  . telmisartan (MICARDIS) 80 MG tablet Take 1 tablet (80 mg total) by mouth daily. 90 tablet 3  .  vitamin B-12 1000 MCG tablet Take 1 tablet (1,000 mcg total) by mouth daily. 30 tablet 1   No current facility-administered medications on file prior to visit.    Review of Systems Constitutional: Negative for other unusual diaphoresis, sweats, appetite or weight changes HENT: Negative for other worsening hearing loss, ear pain, facial swelling, mouth sores or neck stiffness.   Eyes: Negative for other worsening pain, redness or other visual disturbance.  Respiratory: Negative for other stridor or swelling Cardiovascular: Negative for other palpitations or other chest pain  Gastrointestinal: Negative for worsening diarrhea or loose stools, blood in stool, distention or other pain Genitourinary: Negative for hematuria, flank pain or other change in urine volume.  Musculoskeletal: Negative for myalgias or other joint swelling.  Skin: Negative for other color change, or other wound or worsening drainage.  Neurological: Negative for other syncope or numbness. Hematological: Negative for other adenopathy or swelling Psychiatric/Behavioral: Negative for hallucinations, other worsening agitation, SI, self-injury, or new decreased concentration All otherwise neg per pt     Objective:   Physical Exam BP 126/82   Pulse 60   Temp 97.6 F (36.4 C) (Oral)   Ht '5\' 1"'  (1.549 m)   Wt 208 lb (94.3 kg)   SpO2 94%   BMI 39.30 kg/m  BP 126/82   Pulse 60   Temp 97.6 F (36.4 C) (Oral)   Ht '5\' 1"'  (1.549 m)   Wt 208 lb (94.3 kg)   SpO2 94%   BMI 39.30 kg/m  VS noted,  Constitutional: Pt is oriented to person, place, and time. Appears well-developed and well-nourished, in no significant distress and comfortable Head: Normocephalic and atraumatic  Eyes: Conjunctivae and EOM are normal. Pupils are equal, round, and reactive to light Right Ear: External ear normal without discharge Left Ear: External ear normal without discharge Nose: Nose without discharge or deformity Mouth/Throat: Oropharynx  is without other ulcerations and moist  Neck: Normal range of motion. Neck supple. No JVD present. No tracheal deviation present or significant neck LA or mass Cardiovascular: Normal rate, regular rhythm, normal heart sounds and intact distal pulses.   Pulmonary/Chest: WOB normal and breath sounds without rales or wheezing  Abdominal: Soft. Bowel sounds are normal. NT. No HSM  Musculoskeletal: Normal range of motion. Exhibits no edema Lymphadenopathy: Has no other cervical adenopathy.  Neurological: Pt is alert and oriented to person, place, and time. Pt has normal reflexes. No cranial nerve deficit. Motor grossly intact, Gait intact Skin: Skin is warm and dry. No rash noted or new ulcerations Psychiatric:  Has normal mood and affect. Behavior is normal without agitation All otherwise neg per pt Lab Results  Component Value Date   WBC 5.9 02/21/2019   HGB 13.6 02/21/2019   HCT 42.2  02/21/2019   PLT 179.0 02/21/2019   GLUCOSE 91 02/21/2019   CHOL 137 02/21/2019   TRIG 93.0 02/21/2019   HDL 44.70 02/21/2019   LDLDIRECT 74.0 12/06/2015   LDLCALC 74 02/21/2019   ALT 10 02/21/2019   AST 14 02/21/2019   NA 138 02/21/2019   K 5.2 No hemolysis seen (H) 02/21/2019   CL 99 02/21/2019   CREATININE 1.13 02/21/2019   BUN 27 (H) 02/21/2019   CO2 32 02/21/2019   TSH 1.80 02/21/2019   HGBA1C 6.5 02/21/2019   MICROALBUR <0.7 09/01/2017        Assessment & Plan:

## 2019-02-21 NOTE — Assessment & Plan Note (Signed)
For iron level 

## 2019-02-21 NOTE — Assessment & Plan Note (Signed)
stable overall by history and exam, recent data reviewed with pt, and pt to continue medical treatment as before,  to f/u any worsening symptoms or concerns  

## 2019-02-21 NOTE — Assessment & Plan Note (Signed)

## 2019-02-21 NOTE — Patient Instructions (Signed)
You are given the shingles shot prescription today  Please continue all other medications as before, and refills have been done if requested.  Please have the pharmacy call with any other refills you may need.  Please continue your efforts at being more active, low cholesterol diet, and weight control.  You are otherwise up to date with prevention measures today.  Please keep your appointments with your specialists as you may have planned  Please go to the LAB in the Basement (turn left off the elevator) for the tests to be done today  You will be contacted by phone if any changes need to be made immediately.  Otherwise, you will receive a letter about your results with an explanation, but please check with MyChart first.  Please remember to sign up for MyChart if you have not done so, as this will be important to you in the future with finding out test results, communicating by private email, and scheduling acute appointments online when needed.  Please return in 6 months, or sooner if needed, with Lab testing done 3-5 days before

## 2019-02-22 ENCOUNTER — Telehealth: Payer: Self-pay

## 2019-02-22 NOTE — Telephone Encounter (Signed)
Pt has viewed results via MyChart  

## 2019-02-22 NOTE — Telephone Encounter (Signed)
-----   Message from Biagio Borg, MD sent at 02/21/2019  6:48 PM EDT ----- Left message on MyChart, pt to cont same tx except  The test results show that your current treatment is OK, as the tests are stable except for the low Vitamin D level, and the kidney function is mildly slower.  Due to these issues, Please take Vitamin D 50000 units weekly for 12 weeks, then plan to change to OTC Vitamin D3 at 2000 units per day, indefinitely.  Also we should decrease the losartan to HALF pill per day and repeat the kidney function in 1 week.   Shirron to please inform pt daughter; I will do rx for vit d, and HALF dosing for the losartan, and the order

## 2019-02-24 ENCOUNTER — Other Ambulatory Visit: Payer: Self-pay

## 2019-02-24 ENCOUNTER — Ambulatory Visit (INDEPENDENT_AMBULATORY_CARE_PROVIDER_SITE_OTHER): Payer: Medicare Other | Admitting: Podiatry

## 2019-02-24 ENCOUNTER — Encounter: Payer: Self-pay | Admitting: Podiatry

## 2019-02-24 DIAGNOSIS — M79674 Pain in right toe(s): Secondary | ICD-10-CM | POA: Diagnosis not present

## 2019-02-24 DIAGNOSIS — B351 Tinea unguium: Secondary | ICD-10-CM

## 2019-02-24 DIAGNOSIS — E119 Type 2 diabetes mellitus without complications: Secondary | ICD-10-CM | POA: Diagnosis not present

## 2019-02-24 DIAGNOSIS — M79675 Pain in left toe(s): Secondary | ICD-10-CM

## 2019-02-24 MED ORDER — NONFORMULARY OR COMPOUNDED ITEM: 3 refills | Status: DC

## 2019-02-24 NOTE — Patient Instructions (Signed)
Diabetes Mellitus and Foot Care Foot care is an important part of your health, especially when you have diabetes. Diabetes may cause you to have problems because of poor blood flow (circulation) to your feet and legs, which can cause your skin to:  Become thinner and drier.  Break more easily.  Heal more slowly.  Peel and crack. You may also have nerve damage (neuropathy) in your legs and feet, causing decreased feeling in them. This means that you may not notice minor injuries to your feet that could lead to more serious problems. Noticing and addressing any potential problems early is the best way to prevent future foot problems. How to care for your feet Foot hygiene  Wash your feet daily with warm water and mild soap. Do not use hot water. Then, pat your feet and the areas between your toes until they are completely dry. Do not soak your feet as this can dry your skin.  Trim your toenails straight across. Do not dig under them or around the cuticle. File the edges of your nails with an emery board or nail file.  Apply a moisturizing lotion or petroleum jelly to the skin on your feet and to dry, brittle toenails. Use lotion that does not contain alcohol and is unscented. Do not apply lotion between your toes. Shoes and socks  Wear clean socks or stockings every day. Make sure they are not too tight. Do not wear knee-high stockings since they may decrease blood flow to your legs.  Wear shoes that fit properly and have enough cushioning. Always look in your shoes before you put them on to be sure there are no objects inside.  To break in new shoes, wear them for just a few hours a day. This prevents injuries on your feet. Wounds, scrapes, corns, and calluses  Check your feet daily for blisters, cuts, bruises, sores, and redness. If you cannot see the bottom of your feet, use a mirror or ask someone for help.  Do not cut corns or calluses or try to remove them with medicine.  If you  find a minor scrape, cut, or break in the skin on your feet, keep it and the skin around it clean and dry. You may clean these areas with mild soap and water. Do not clean the area with peroxide, alcohol, or iodine.  If you have a wound, scrape, corn, or callus on your foot, look at it several times a day to make sure it is healing and not infected. Check for: ? Redness, swelling, or pain. ? Fluid or blood. ? Warmth. ? Pus or a bad smell. General instructions  Do not cross your legs. This may decrease blood flow to your feet.  Do not use heating pads or hot water bottles on your feet. They may burn your skin. If you have lost feeling in your feet or legs, you may not know this is happening until it is too late.  Protect your feet from hot and cold by wearing shoes, such as at the beach or on hot pavement.  Schedule a complete foot exam at least once a year (annually) or more often if you have foot problems. If you have foot problems, report any cuts, sores, or bruises to your health care provider immediately. Contact a health care provider if:  You have a medical condition that increases your risk of infection and you have any cuts, sores, or bruises on your feet.  You have an injury that is not   healing.  You have redness on your legs or feet.  You feel burning or tingling in your legs or feet.  You have pain or cramps in your legs and feet.  Your legs or feet are numb.  Your feet always feel cold.  You have pain around a toenail. Get help right away if:  You have a wound, scrape, corn, or callus on your foot and: ? You have pain, swelling, or redness that gets worse. ? You have fluid or blood coming from the wound, scrape, corn, or callus. ? Your wound, scrape, corn, or callus feels warm to the touch. ? You have pus or a bad smell coming from the wound, scrape, corn, or callus. ? You have a fever. ? You have a red line going up your leg. Summary  Check your feet every day  for cuts, sores, red spots, swelling, and blisters.  Moisturize feet and legs daily.  Wear shoes that fit properly and have enough cushioning.  If you have foot problems, report any cuts, sores, or bruises to your health care provider immediately.  Schedule a complete foot exam at least once a year (annually) or more often if you have foot problems. This information is not intended to replace advice given to you by your health care provider. Make sure you discuss any questions you have with your health care provider. Document Released: 05/08/2000 Document Revised: 06/23/2017 Document Reviewed: 06/12/2016 Elsevier Patient Education  2020 [employer]   Onychomycosis/Fungal Toenails  WHAT IS IT? An infection that lies within the keratin of your nail plate that is caused by a fungus.  WHY ME? Fungal infections affect all ages, sexes, races, and creeds.  There may be many factors that predispose you to a fungal infection such as age, coexisting medical conditions such as diabetes, or an autoimmune disease; stress, medications, fatigue, genetics, etc.  Bottom line: fungus thrives in a warm, moist environment and your shoes offer such a location.  IS IT CONTAGIOUS? Theoretically, yes.  You do not want to share shoes, nail clippers or files with someone who has fungal toenails.  Walking around barefoot in the same room or sleeping in the same bed is unlikely to transfer the organism.  It is important to realize, however, that fungus can spread easily from one nail to the next on the same foot.  HOW DO WE TREAT THIS?  There are several ways to treat this condition.  Treatment may depend on many factors such as age, medications, pregnancy, liver and kidney conditions, etc.  It is best to ask your doctor which options are available to you.  1. No treatment.   Unlike many other medical concerns, you can live with this condition.  However for many people this can be a painful condition and may lead to  ingrown toenails or a bacterial infection.  It is recommended that you keep the nails cut short to help reduce the amount of fungal nail. 2. Topical treatment.  These range from herbal remedies to prescription strength nail lacquers.  About 40-50% effective, topicals require twice daily application for approximately 9 to 12 months or until an entirely new nail has grown out.  The most effective topicals are medical grade medications available through physicians offices. 3. Oral antifungal medications.  With an 80-90% cure rate, the most common oral medication requires 3 to 4 months of therapy and stays in your system for a year as the new nail grows out.  Oral antifungal medications do require   blood work to make sure it is a safe drug for you.  A liver function panel will be performed prior to starting the medication and after the first month of treatment.  It is important to have the blood work performed to avoid any harmful side effects.  In general, this medication safe but blood work is required. 4. Laser Therapy.  This treatment is performed by applying a specialized laser to the affected nail plate.  This therapy is noninvasive, fast, and non-painful.  It is not covered by insurance and is therefore, out of pocket.  The results have been very good with a 80-95% cure rate.  The [facility] is the only practice in the area to offer this therapy. 5. Permanent Nail Avulsion.  Removing the entire nail so that a new nail will not grow back. 

## 2019-02-26 ENCOUNTER — Other Ambulatory Visit: Payer: Self-pay | Admitting: Physician Assistant

## 2019-02-26 NOTE — Progress Notes (Signed)
Subjective: Kathy Thomas is seen today for follow up painful, elongated, thickened toenails 1-5 b/l feet that she cannot cut. Pain interferes with daily activities. Aggravating factor includes wearing enclosed shoe gear and relieved with periodic debridement.  Daughter is present during the visit. Daughter would like to know what treatment options are available for her Mom for onychomycosis.  Current Outpatient Medications on File Prior to Visit  Medication Sig  . alendronate (FOSAMAX) 70 MG tablet TAKE 1 TABLET BY MOUTH EVERY 7 DAYS. TAKE WITH A FULL GLASS OF WATER ON AN EMPTY STOMACH.  Marland Kitchen aspirin EC 81 MG tablet Take 1 tablet (81 mg total) by mouth daily.  Marland Kitchen atorvastatin (LIPITOR) 40 MG tablet Take 1 tablet (40 mg total) by mouth at bedtime.  . Blood Glucose Monitoring Suppl (ONE TOUCH ULTRA 2) w/Device KIT Use as directed twice per day E11.9  . Calcium 1200-1000 MG-UNIT CHEW Chew by mouth daily.    Marland Kitchen dicyclomine (BENTYL) 20 MG tablet Take 1 tablet (20 mg total) by mouth 2 (two) times daily.  . diphenoxylate-atropine (LOMOTIL) 2.5-0.025 MG tablet Take 1 tablet by mouth 4 (four) times daily as needed for diarrhea or loose stools.  . donepezil (ARICEPT) 10 MG tablet 1 tab by mouth daily  . glucose blood (ONE TOUCH ULTRA TEST) test strip Use as instructed twice per day E11.9  . Lancets MISC Use as directed twice per day E11.9  . losartan (COZAAR) 100 MG tablet Take 50 mg by mouth daily.  . pioglitazone (ACTOS) 30 MG tablet Take 1 tablet (30 mg total) by mouth daily.  . vitamin B-12 1000 MCG tablet Take 1 tablet (1,000 mcg total) by mouth daily.  . Vitamin D, Ergocalciferol, (DRISDOL) 1.25 MG (50000 UT) CAPS capsule Take 1 capsule (50,000 Units total) by mouth every 7 (seven) days.   No current facility-administered medications on file prior to visit.      Allergies  Allergen Reactions  . Codeine Hives  . Penicillins     REACTION: rash  . Morphine Rash    REACTION: pt unsure of  reaction   Objective:  Vascular Examination: Capillary refill time immediate x 10 digits.  Dorsalis pedis present b/l.  Posterior tibial pulses present b/l.  Digital hair  present x 10 digits.  Skin temperature gradient WNL b/l.   Dermatological Examination: Skin with normal turgor, texture and tone b/l.  Toenails 1-5 b/l discolored, thick, dystrophic with subungual debris and pain with palpation to nailbeds due to thickness of nails.  Left hallux nail with noted fungal debris at proximal nail border with nail border hypertrophy. No erythema, no edema, no drainage, no flocculence.  Musculoskeletal: Muscle strength 5/5 to all LE muscle groups.  No pain, crepitus or joint limitation noted with ROM.   Neurological Examination: Protective sensation intact with 10 gram monofilament bilaterally.  Epicritic sensation present bilaterally.  Assessment: Painful onychomycosis toenails 1-5 b/l  Diabetes  Plan: 1. Toenails 1-5 b/l were debrided in length and girth without iatrogenic bleeding. Proximal nail border curretaged of fungal debris left hallux. Border cleansed with alcohol and triple antibiotic applied. Daughter instructed to apply antibiotic ointment to digit once daily for one week. 2. Rx written for nonformulary compounding topical antifungal: [facility]: Antifungal cream - Terbinafine 3%, Fluconazole 2%, Tea Tree Oil 5%, Urea 10%, Ibuprofen 2% in DMSO Suspension #76m. Apply to the affected nail(s) at bedtime. 3. Patient to continue soft, supportive shoe gear. 4. Patient to report any pedal injuries to medical professional immediately.  5. Follow up 3 months.  6. Patient/POA to call should there be a concern in the interim.

## 2019-03-01 ENCOUNTER — Telehealth: Payer: Self-pay

## 2019-03-01 ENCOUNTER — Telehealth: Payer: Self-pay | Admitting: Physician Assistant

## 2019-03-01 NOTE — Telephone Encounter (Signed)
A representative from [facility] called regarding approval for dicyclomine. They need to know if pt has IBS so they can make a determination. Pls call insurance at [phone], opt 5, just give pt's name and DOB.

## 2019-03-01 NOTE — Telephone Encounter (Signed)
Called and was on hold for 11 min. Had to attend to a pt in clinic, hung up. Will call back later.

## 2019-03-01 NOTE — Telephone Encounter (Signed)
PA submitted with cover my meds.

## 2019-03-03 NOTE — Telephone Encounter (Signed)
Prior auth was denied

## 2019-05-14 ENCOUNTER — Other Ambulatory Visit: Payer: Self-pay | Admitting: Internal Medicine

## 2019-05-15 NOTE — Telephone Encounter (Signed)
Instead of high dose Vit D -   then plan to change to OTC Vitamin D3 at 2000 units per day, indefinitely.

## 2019-05-16 NOTE — Telephone Encounter (Signed)
Called pt there was no answer LMOM w/MD response../lmb 

## 2019-05-24 ENCOUNTER — Other Ambulatory Visit: Payer: Self-pay | Admitting: Internal Medicine

## 2019-05-24 NOTE — Telephone Encounter (Signed)
For routine refill policy 

## 2019-05-30 ENCOUNTER — Ambulatory Visit: Payer: Medicare Other | Admitting: Podiatry

## 2019-05-31 ENCOUNTER — Other Ambulatory Visit: Payer: Self-pay

## 2019-05-31 ENCOUNTER — Ambulatory Visit (INDEPENDENT_AMBULATORY_CARE_PROVIDER_SITE_OTHER): Payer: Medicare Other | Admitting: Podiatry

## 2019-05-31 ENCOUNTER — Encounter: Payer: Self-pay | Admitting: Podiatry

## 2019-05-31 DIAGNOSIS — B351 Tinea unguium: Secondary | ICD-10-CM | POA: Diagnosis not present

## 2019-05-31 DIAGNOSIS — M79675 Pain in left toe(s): Secondary | ICD-10-CM

## 2019-05-31 DIAGNOSIS — M79674 Pain in right toe(s): Secondary | ICD-10-CM

## 2019-05-31 DIAGNOSIS — E119 Type 2 diabetes mellitus without complications: Secondary | ICD-10-CM | POA: Diagnosis not present

## 2019-05-31 NOTE — Patient Instructions (Signed)
Diabetes Mellitus and Foot Care Foot care is an important part of your health, especially when you have diabetes. Diabetes may cause you to have problems because of poor blood flow (circulation) to your feet and legs, which can cause your skin to:  Become thinner and drier.  Break more easily.  Heal more slowly.  Peel and crack. You may also have nerve damage (neuropathy) in your legs and feet, causing decreased feeling in them. This means that you may not notice minor injuries to your feet that could lead to more serious problems. Noticing and addressing any potential problems early is the best way to prevent future foot problems. How to care for your feet Foot hygiene  Wash your feet daily with warm water and mild soap. Do not use hot water. Then, pat your feet and the areas between your toes until they are completely dry. Do not soak your feet as this can dry your skin.  Trim your toenails straight across. Do not dig under them or around the cuticle. File the edges of your nails with an emery board or nail file.  Apply a moisturizing lotion or petroleum jelly to the skin on your feet and to dry, brittle toenails. Use lotion that does not contain alcohol and is unscented. Do not apply lotion between your toes. Shoes and socks  Wear clean socks or stockings every day. Make sure they are not too tight. Do not wear knee-high stockings since they may decrease blood flow to your legs.  Wear shoes that fit properly and have enough cushioning. Always look in your shoes before you put them on to be sure there are no objects inside.  To break in new shoes, wear them for just a few hours a day. This prevents injuries on your feet. Wounds, scrapes, corns, and calluses  Check your feet daily for blisters, cuts, bruises, sores, and redness. If you cannot see the bottom of your feet, use a mirror or ask someone for help.  Do not cut corns or calluses or try to remove them with medicine.  If you  find a minor scrape, cut, or break in the skin on your feet, keep it and the skin around it clean and dry. You may clean these areas with mild soap and water. Do not clean the area with peroxide, alcohol, or iodine.  If you have a wound, scrape, corn, or callus on your foot, look at it several times a day to make sure it is healing and not infected. Check for: ? Redness, swelling, or pain. ? Fluid or blood. ? Warmth. ? Pus or a bad smell. General instructions  Do not cross your legs. This may decrease blood flow to your feet.  Do not use heating pads or hot water bottles on your feet. They may burn your skin. If you have lost feeling in your feet or legs, you may not know this is happening until it is too late.  Protect your feet from hot and cold by wearing shoes, such as at the beach or on hot pavement.  Schedule a complete foot exam at least once a year (annually) or more often if you have foot problems. If you have foot problems, report any cuts, sores, or bruises to your health care provider immediately. Contact a health care provider if:  You have a medical condition that increases your risk of infection and you have any cuts, sores, or bruises on your feet.  You have an injury that is not   healing.  You have redness on your legs or feet.  You feel burning or tingling in your legs or feet.  You have pain or cramps in your legs and feet.  Your legs or feet are numb.  Your feet always feel cold.  You have pain around a toenail. Get help right away if:  You have a wound, scrape, corn, or callus on your foot and: ? You have pain, swelling, or redness that gets worse. ? You have fluid or blood coming from the wound, scrape, corn, or callus. ? Your wound, scrape, corn, or callus feels warm to the touch. ? You have pus or a bad smell coming from the wound, scrape, corn, or callus. ? You have a fever. ? You have a red line going up your leg. Summary  Check your feet every day  for cuts, sores, red spots, swelling, and blisters.  Moisturize feet and legs daily.  Wear shoes that fit properly and have enough cushioning.  If you have foot problems, report any cuts, sores, or bruises to your health care provider immediately.  Schedule a complete foot exam at least once a year (annually) or more often if you have foot problems. This information is not intended to replace advice given to you by your health care provider. Make sure you discuss any questions you have with your health care provider. Document Revised: 02/01/2019 Document Reviewed: 06/12/2016 Elsevier Patient Education  2020 [employer]  Onychomycosis/Fungal Toenails  WHAT IS IT? An infection that lies within the keratin of your nail plate that is caused by a fungus.  WHY ME? Fungal infections affect all ages, sexes, races, and creeds.  There may be many factors that predispose you to a fungal infection such as age, coexisting medical conditions such as diabetes, or an autoimmune disease; stress, medications, fatigue, genetics, etc.  Bottom line: fungus thrives in a warm, moist environment and your shoes offer such a location.  IS IT CONTAGIOUS? Theoretically, yes.  You do not want to share shoes, nail clippers or files with someone who has fungal toenails.  Walking around barefoot in the same room or sleeping in the same bed is unlikely to transfer the organism.  It is important to realize, however, that fungus can spread easily from one nail to the next on the same foot.  HOW DO WE TREAT THIS?  There are several ways to treat this condition.  Treatment may depend on many factors such as age, medications, pregnancy, liver and kidney conditions, etc.  It is best to ask your doctor which options are available to you.  5. No treatment.   Unlike many other medical concerns, you can live with this condition.  However for many people this can be a painful condition and may lead to ingrown toenails or a bacterial  infection.  It is recommended that you keep the nails cut short to help reduce the amount of fungal nail. 6. Topical treatment.  These range from herbal remedies to prescription strength nail lacquers.  About 40-50% effective, topicals require twice daily application for approximately 9 to 12 months or until an entirely new nail has grown out.  The most effective topicals are medical grade medications available through physicians offices. 7. Oral antifungal medications.  With an 80-90% cure rate, the most common oral medication requires 3 to 4 months of therapy and stays in your system for a year as the new nail grows out.  Oral antifungal medications do require blood work to make   sure it is a safe drug for you.  A liver function panel will be performed prior to starting the medication and after the first month of treatment.  It is important to have the blood work performed to avoid any harmful side effects.  In general, this medication safe but blood work is required. 8. Laser Therapy.  This treatment is performed by applying a specialized laser to the affected nail plate.  This therapy is noninvasive, fast, and non-painful.  It is not covered by insurance and is therefore, out of pocket.  The results have been very good with a 80-95% cure rate.  [facility] is the only practice in the area to offer this therapy. 9. Permanent Nail Avulsion.  Removing the entire nail so that a new nail will not grow back. 

## 2019-06-04 NOTE — Progress Notes (Signed)
Subjective: Kathy Thomas presents today for preventative diabetic foot. Patient is seen for follow up of painful, mycotic toenails which interfere with comfortable ambulation when wearing enclosed shoe gear. Pain is relieved with periodic professional debridement.  [name], MD is patient's PCP. Last visit was: 02/21/2019.  Medications reviewed in chart.  Allergies  Allergen Reactions  . Codeine Hives  . Penicillins     REACTION: rash  . Morphine Rash    REACTION: pt unsure of reaction    Objective: There were no vitals filed for this visit.  Vascular Examination: Capillary refill time immediate x 10 digits.  Dorsalis pedis present b/l.  Posterior tibial pulses present b/l.  Digital hair present x 10 digits.  Skin temperature gradient WNL b/l.   Dermatological Examination: Skin with normal turgor, texture and tone b/l.  Toenails 1-5 b/l discolored, thick, dystrophic with subungual debris and pain with palpation to nailbeds due to thickness of nails.  Musculoskeletal: Muscle strength 5/5 b/l to all LE muscle groups.  Gross bony deformities:  none.  No pain, crepitus or joint limitation with passive/active ROM b/l.  Neurological Examination: Protective sensation intact 5/5 b/l with 10 gram monofilament.  Vibratory sensation intact bilaterally.   Assessment: 1. Painful onychomycosis toenails 1-5 b/l 2. NIDDM  Plan: 1. Continue diabetic foot care principles. Literature dispensed on today. Continue topical antifugal to toenails before bedtime. 2. Toenails 1-5 b/l were debrided in length and girth without iatrogenic bleeding. 3. Patient to continue soft, supportive shoe gear. 4. Patient to report any pedal injuries to medical professional. 5. Follow up 3 months.  6. Patient/POA to call should there be a concern in the interim.

## 2019-07-01 ENCOUNTER — Other Ambulatory Visit: Payer: Self-pay | Admitting: Internal Medicine

## 2019-07-01 NOTE — Telephone Encounter (Signed)
Please refill as per office routine med refill policy (all routine meds refilled for 3 mo or monthly per pt preference up to one year from last visit, then month to month grace period for 3 mo, then further med refills will have to be denied)  

## 2019-07-22 ENCOUNTER — Other Ambulatory Visit: Payer: Self-pay | Admitting: Physician Assistant

## 2019-08-15 ENCOUNTER — Other Ambulatory Visit: Payer: Self-pay | Admitting: Internal Medicine

## 2019-08-15 NOTE — Telephone Encounter (Addendum)
Call from [facility] requesting refills on behalf of the patient   1.Medication Requested: donepezil (ARICEPT) 10 MG tablet pioglitazone (ACTOS) 30 MG tablet atorvastatin (LIPITOR) 40 MG tablet alendronate (FOSAMAX) 70 MG tablet losartan (COZAAR) 100 MG tablet   2. Pharmacy (Name, Street, [place]): [facility]  3. On Med List: yes  4. Last Visit with PCP: 02/21/1999  5. Next visit date with PCP: n/a   Agent: Please be advised that RX refills may take up to 3 business days. We ask that you follow-up with your pharmacy.

## 2019-08-16 ENCOUNTER — Other Ambulatory Visit: Payer: Self-pay | Admitting: Internal Medicine

## 2019-08-16 MED ORDER — LOSARTAN POTASSIUM 100 MG PO TABS: 100.0000 mg | ORAL_TABLET | Freq: Every day | ORAL | 1 refills | Status: DC

## 2019-08-16 MED ORDER — PIOGLITAZONE HCL 30 MG PO TABS: 30.0000 mg | ORAL_TABLET | Freq: Every day | ORAL | 1 refills | Status: DC

## 2019-08-16 MED ORDER — ALENDRONATE SODIUM 70 MG PO TABS: ORAL_TABLET | ORAL | 1 refills | Status: DC

## 2019-08-16 MED ORDER — ATORVASTATIN CALCIUM 40 MG PO TABS: 40.0000 mg | ORAL_TABLET | Freq: Every day | ORAL | 1 refills | Status: DC

## 2019-08-16 MED ORDER — DONEPEZIL HCL 10 MG PO TABS: 10.0000 mg | ORAL_TABLET | Freq: Every day | ORAL | 3 refills | Status: DC

## 2019-08-16 NOTE — Telephone Encounter (Signed)
erx has been sent as requested.  

## 2019-08-16 NOTE — Telephone Encounter (Signed)
Reviewed chart pt is up-to-date sent refills to [facility].../lmb  

## 2019-08-16 NOTE — Telephone Encounter (Signed)
New message:   1.Medication Requested: donepezil (ARICEPT) 10 MG tablet 2. Pharmacy (Name, Street, [facility]): [facility] - [place], [place] [address] 3. On Med List: Yes  4. Last Visit with PCP: 02/21/19  5. Next visit date with PCP:   Agent: Please be advised that RX refills may take up to 3 business days. We ask that you follow-up with your pharmacy.

## 2019-09-27 ENCOUNTER — Telehealth: Payer: Self-pay | Admitting: Physician Assistant

## 2019-09-27 NOTE — Telephone Encounter (Signed)
[facility] stated that dicyclomine requires a PA.

## 2019-09-27 NOTE — Telephone Encounter (Signed)
Called [facility].  The Rx was transferred from [facility].  Pharmacist confirmed it was only about $20 for a 90 day supply.

## 2019-10-11 ENCOUNTER — Telehealth: Payer: Self-pay | Admitting: Internal Medicine

## 2019-10-11 NOTE — Telephone Encounter (Signed)
    Daughter calling on behalf of patient to request med for cold sore on lip. She states OTC med is not helping Appointment made for 5/28

## 2019-10-12 MED ORDER — ACYCLOVIR 200 MG PO CAPS: 200.0000 mg | ORAL_CAPSULE | Freq: Every day | ORAL | 5 refills | Status: AC

## 2019-10-12 NOTE — Telephone Encounter (Signed)
[facility] 

## 2019-10-20 ENCOUNTER — Other Ambulatory Visit: Payer: Self-pay

## 2019-10-20 ENCOUNTER — Encounter: Payer: Self-pay | Admitting: Internal Medicine

## 2019-10-20 ENCOUNTER — Ambulatory Visit (INDEPENDENT_AMBULATORY_CARE_PROVIDER_SITE_OTHER): Payer: Medicare Other

## 2019-10-20 ENCOUNTER — Ambulatory Visit (INDEPENDENT_AMBULATORY_CARE_PROVIDER_SITE_OTHER): Payer: Medicare Other | Admitting: Internal Medicine

## 2019-10-20 VITALS — BP 110/60 | HR 50 | Temp 97.6°F | Ht 61.0 in | Wt 207.0 lb

## 2019-10-20 VITALS — BP 110/80 | HR 45 | Temp 97.7°F | Ht 61.0 in | Wt 207.0 lb

## 2019-10-20 DIAGNOSIS — F0390 Unspecified dementia without behavioral disturbance: Secondary | ICD-10-CM

## 2019-10-20 DIAGNOSIS — Z022 Encounter for examination for admission to residential institution: Secondary | ICD-10-CM | POA: Insufficient documentation

## 2019-10-20 DIAGNOSIS — Z0000 Encounter for general adult medical examination without abnormal findings: Secondary | ICD-10-CM

## 2019-10-20 DIAGNOSIS — I5022 Chronic systolic (congestive) heart failure: Secondary | ICD-10-CM | POA: Diagnosis not present

## 2019-10-20 DIAGNOSIS — I10 Essential (primary) hypertension: Secondary | ICD-10-CM

## 2019-10-20 DIAGNOSIS — E785 Hyperlipidemia, unspecified: Secondary | ICD-10-CM

## 2019-10-20 DIAGNOSIS — E119 Type 2 diabetes mellitus without complications: Secondary | ICD-10-CM | POA: Diagnosis not present

## 2019-10-20 MED ORDER — DICYCLOMINE HCL 20 MG PO TABS: 20.0000 mg | ORAL_TABLET | Freq: Two times a day (BID) | ORAL | 2 refills | Status: DC

## 2019-10-20 NOTE — Patient Instructions (Signed)
Kathy Thomas , Thank you for taking time to come for your Medicare Wellness Visit. I appreciate your ongoing commitment to your health goals. Please review the following plan we discussed and let me know if I can assist you in the future.   Screening recommendations/referrals: Colonoscopy: no repeat due to age Mammogram: no repeat due to age Bone Density: 09/07/2017 Recommended yearly ophthalmology/optometry visit for glaucoma screening and checkup Recommended yearly dental visit for hygiene and checkup  Vaccinations: Influenza vaccine: 02/21/2019 Pneumococcal vaccine: completed Tdap vaccine: 10/26/2008; due every 10 years Shingles vaccine: will check with local pharmacy or with Dr. Jonny Ruiz   Covid-19: declined  Advanced directives: Yes  Conditions/risks identified: Yes  Next appointment: Please schedule your 1 year Medicare Wellness Visit with your Health Coach.  Preventive Care 65 Years and Older, Female Preventive care refers to lifestyle choices and visits with your health care provider that can promote health and wellness. What does preventive care include?  A yearly physical exam. This is also called an annual well check.  Dental exams once or twice a year.  Routine eye exams. Ask your health care provider how often you should have your eyes checked.  Personal lifestyle choices, including:  Daily care of your teeth and gums.  Regular physical activity.  Eating a healthy diet.  Avoiding tobacco and drug use.  Limiting alcohol use.  Practicing safe sex.  Taking low-dose aspirin every day.  Taking vitamin and mineral supplements as recommended by your health care provider. What happens during an annual well check? The services and screenings done by your health care provider during your annual well check will depend on your age, overall health, lifestyle risk factors, and family history of disease. Counseling  Your health care provider may ask you questions about your:   Alcohol use.  Tobacco use.  Drug use.  Emotional well-being.  Home and relationship well-being.  Sexual activity.  Eating habits.  History of falls.  Memory and ability to understand (cognition).  Work and work Astronomer.  Reproductive health. Screening  You may have the following tests or measurements:  Height, weight, and BMI.  Blood pressure.  Lipid and cholesterol levels. These may be checked every 5 years, or more frequently if you are over 61 years old.  Skin check.  Lung cancer screening. You may have this screening every year starting at age 77 if you have a 30-pack-year history of smoking and currently smoke or have quit within the past 15 years.  Fecal occult blood test (FOBT) of the stool. You may have this test every year starting at age 60.  Flexible sigmoidoscopy or colonoscopy. You may have a sigmoidoscopy every 5 years or a colonoscopy every 10 years starting at age 53.  Hepatitis C blood test.  Hepatitis B blood test.  Sexually transmitted disease (STD) testing.  Diabetes screening. This is done by checking your blood sugar (glucose) after you have not eaten for a while (fasting). You may have this done every 1-3 years.  Bone density scan. This is done to screen for osteoporosis. You may have this done starting at age 20.  Mammogram. This may be done every 1-2 years. Talk to your health care provider about how often you should have regular mammograms. Talk with your health care provider about your test results, treatment options, and if necessary, the need for more tests. Vaccines  Your health care provider may recommend certain vaccines, such as:  Influenza vaccine. This is recommended every year.  Tetanus, diphtheria,  and acellular pertussis (Tdap, Td) vaccine. You may need a Td booster every 10 years.  Zoster vaccine. You may need this after age 81.  Pneumococcal 13-valent conjugate (PCV13) vaccine. One dose is recommended after age  29.  Pneumococcal polysaccharide (PPSV23) vaccine. One dose is recommended after age 35. Talk to your health care provider about which screenings and vaccines you need and how often you need them. This information is not intended to replace advice given to you by your health care provider. Make sure you discuss any questions you have with your health care provider. Document Released: 06/07/2015 Document Revised: 01/29/2016 Document Reviewed: 03/12/2015 Elsevier Interactive Patient Education  2017 [employer] Prevention in the Home Falls can cause injuries. They can happen to people of all ages. There are many things you can do to make your home safe and to help prevent falls. What can I do on the outside of my home?  Regularly fix the edges of walkways and driveways and fix any cracks.  Remove anything that might make you trip as you walk through a door, such as a raised step or threshold.  Trim any bushes or trees on the path to your home.  Use bright outdoor lighting.  Clear any walking paths of anything that might make someone trip, such as rocks or tools.  Regularly check to see if handrails are loose or broken. Make sure that both sides of any steps have handrails.  Any raised decks and porches should have guardrails on the edges.  Have any leaves, snow, or ice cleared regularly.  Use sand or salt on walking paths during winter.  Clean up any spills in your garage right away. This includes oil or grease spills. What can I do in the bathroom?  Use night lights.  Install grab bars by the toilet and in the tub and shower. Do not use towel bars as grab bars.  Use non-skid mats or decals in the tub or shower.  If you need to sit down in the shower, use a plastic, non-slip stool.  Keep the floor dry. Clean up any water that spills on the floor as soon as it happens.  Remove soap buildup in the tub or shower regularly.  Attach bath mats securely with double-sided  non-slip rug tape.  Do not have throw rugs and other things on the floor that can make you trip. What can I do in the bedroom?  Use night lights.  Make sure that you have a light by your bed that is easy to reach.  Do not use any sheets or blankets that are too big for your bed. They should not hang down onto the floor.  Have a firm chair that has side arms. You can use this for support while you get dressed.  Do not have throw rugs and other things on the floor that can make you trip. What can I do in the kitchen?  Clean up any spills right away.  Avoid walking on wet floors.  Keep items that you use a lot in easy-to-reach places.  If you need to reach something above you, use a strong step stool that has a grab bar.  Keep electrical cords out of the way.  Do not use floor polish or wax that makes floors slippery. If you must use wax, use non-skid floor wax.  Do not have throw rugs and other things on the floor that can make you trip. What can I do with my  stairs?  Do not leave any items on the stairs.  Make sure that there are handrails on both sides of the stairs and use them. Fix handrails that are broken or loose. Make sure that handrails are as long as the stairways.  Check any carpeting to make sure that it is firmly attached to the stairs. Fix any carpet that is loose or worn.  Avoid having throw rugs at the top or bottom of the stairs. If you do have throw rugs, attach them to the floor with carpet tape.  Make sure that you have a light switch at the top of the stairs and the bottom of the stairs. If you do not have them, ask someone to add them for you. What else can I do to help prevent falls?  Wear shoes that:  Do not have high heels.  Have rubber bottoms.  Are comfortable and fit you well.  Are closed at the toe. Do not wear sandals.  If you use a stepladder:  Make sure that it is fully opened. Do not climb a closed stepladder.  Make sure that both  sides of the stepladder are locked into place.  Ask someone to hold it for you, if possible.  Clearly mark and make sure that you can see:  Any grab bars or handrails.  First and last steps.  Where the edge of each step is.  Use tools that help you move around (mobility aids) if they are needed. These include:  Canes.  Walkers.  Scooters.  Crutches.  Turn on the lights when you go into a dark area. Replace any light bulbs as soon as they burn out.  Set up your furniture so you have a clear path. Avoid moving your furniture around.  If any of your floors are uneven, fix them.  If there are any pets around you, be aware of where they are.  Review your medicines with your doctor. Some medicines can make you feel dizzy. This can increase your chance of falling. Ask your doctor what other things that you can do to help prevent falls. This information is not intended to replace advice given to you by your health care provider. Make sure you discuss any questions you have with your health care provider. Document Released: 03/07/2009 Document Revised: 10/17/2015 Document Reviewed: 06/15/2014 Elsevier Interactive Patient Education  2017 [employer].

## 2019-10-20 NOTE — Assessment & Plan Note (Signed)
stable overall by history and exam, recent data reviewed with pt, and pt to continue medical treatment as before,  to f/u any worsening symptoms or concerns  

## 2019-10-20 NOTE — Progress Notes (Signed)
Subjective:    Patient ID: Kathy Thomas, female    DOB: 16-Jan-1935, 85 y.o.   MRN: [id number]  HPI  Here to f/u; overall doing ok,  Pt denies chest pain, increasing sob or doe, wheezing, orthopnea, PND, increased LE swelling, palpitations, dizziness or syncope.  Pt denies new neurological symptoms such as new headache, or facial or extremity weakness or numbness.  Pt denies polydipsia, polyuria, or low sugar episode.  Pt states overall good compliance with meds, mostly trying to follow appropriate diet, with wt overall stable,  but little exercise however.  Dementia overall stable symptomatically, and not assoc with behavioral changes such as hallucinations, paranoia, or agitation.  Needs fl2 filled out for assisted living placement. Past Medical History:  Diagnosis Date  . Acute sinusitis, unspecified 05/28/2008  . ANEMIA-IRON DEFICIENCY 10/26/2008  . ANXIETY 10/06/2007  . CHOLELITHIASIS 10/06/2007  . COLONIC POLYPS, HX OF 01/26/2007  . DIABETES MELLITUS, TYPE II 10/06/2007  . DIVERTICULOSIS, COLON 01/26/2007  . ECCHYMOSES, SPONTANEOUS 06/04/2010  . GERD 01/26/2007  . Heart murmur    slight heart murmur  . HERNIATED DISC 01/26/2007  . HYPERLIPIDEMIA 10/06/2007  . HYPERTENSION 01/26/2007  . LOW BACK PAIN 01/29/2007  . MENOPAUSAL DISORDER 10/26/2008  . OSTEOPOROSIS 06/04/2010  . SHOULDER PAIN, LEFT 10/06/2007  . Stroke [facility])    TIA's   Past Surgical History:  Procedure Laterality Date  . ABDOMINAL HYSTERECTOMY    . APPENDECTOMY    . Fracture left  wrist  2010   Dr. Amedeo Plenty  . Herniated disk   1995   C-spine  . Left ankle-screw placement  1980  . Left elbow-nerve impingement  1985  . TONSILLECTOMY      reports that she quit smoking about 6 years ago. Her smoking use included cigarettes. She smoked 0.00 packs per day. She has never used smokeless tobacco. She reports that she does not drink alcohol or use drugs. family history includes Heart disease in her father; Hyperlipidemia in an other family  member; Hypertension in an other family member; Stroke in an other family member. Allergies  Allergen Reactions  . Codeine Hives  . Penicillins     REACTION: rash  . Morphine Rash    REACTION: pt unsure of reaction   Current Outpatient Medications on File Prior to Visit  Medication Sig Dispense Refill  . alendronate (FOSAMAX) 70 MG tablet Take with a full glass of water on an empty stomach. 12 tablet 1  . aspirin EC 81 MG tablet Take 1 tablet (81 mg total) by mouth daily. 90 tablet 11  . atorvastatin (LIPITOR) 40 MG tablet Take 1 tablet (40 mg total) by mouth at bedtime. Annual appt due in Sept must see provider for future refills 90 tablet 1  . Blood Glucose Monitoring Suppl (ONE TOUCH ULTRA 2) w/Device KIT Use as directed twice per day E11.9 1 each 0  . Calcium 1200-1000 MG-UNIT CHEW Chew by mouth daily.      Marland Kitchen CALCIUM CITRATE-VITAMIN D3 PO Take by mouth.    . diphenoxylate-atropine (LOMOTIL) 2.5-0.025 MG tablet Take 1 tablet by mouth 4 (four) times daily as needed for diarrhea or loose stools. 40 tablet 0  . donepezil (ARICEPT) 10 MG tablet Take 1 tablet (10 mg total) by mouth daily. 90 tablet 3  . glucose blood (ONE TOUCH ULTRA TEST) test strip Use as instructed twice per day E11.9 200 each 12  . Lancets MISC Use as directed twice per day E11.9 200 each 11  .  losartan (COZAAR) 100 MG tablet Take 1 tablet (100 mg total) by mouth daily. Annual appt due in Sept must see provider for future refills 180 tablet 1  . NONFORMULARY OR COMPOUNDED ITEM Antifungal solution: Terbinafine 3%, Fluconazole 2%, Tea Tree Oil 5%, Urea 10%, Ibuprofen 2% in DMSO suspension #61m 1 each 3  . pioglitazone (ACTOS) 30 MG tablet Take 1 tablet (30 mg total) by mouth daily. Annual appt due in Sept must see provider for future refills 90 tablet 1  . vitamin B-12 1000 MCG tablet Take 1 tablet (1,000 mcg total) by mouth daily. 30 tablet 1   No current facility-administered medications on file prior to visit.    Review of Systems All otherwise neg per pt    Objective:   Physical Exam BP 110/80 (BP Location: Left Arm, Patient Position: Sitting, Cuff Size: Large)   Pulse (!) 45   Temp 97.7 F (36.5 C) (Oral)   Ht _0  (1.549 m)   Wt 207 lb (93.9 kg)   SpO2 98%   BMI 39.11 kg/m  VS noted,  Constitutional: Pt appears in NAD HENT: Head: NCAT.  Right Ear: External ear normal.  Left Ear: External ear normal.  Eyes: . Pupils are equal, round, and reactive to light. Conjunctivae and EOM are normal Nose: without d/c or deformity Neck: Neck supple. Gross normal ROM Cardiovascular: Normal rate and regular rhythm.   Pulmonary/Chest: Effort normal and breath sounds without rales or wheezing.  Abd:  Soft, NT, ND, + BS, no organomegaly Neurological: Pt is alert. At baseline orientation, motor grossly intact Skin: Skin is warm. No rashes, other new lesions, no LE edema Psychiatric: Pt behavior is normal without agitation  All otherwise neg per pt Lab Results  Component Value Date   WBC 5.9 02/21/2019   HGB 13.6 02/21/2019   HCT 42.2 02/21/2019   PLT 179.0 02/21/2019   GLUCOSE 91 02/21/2019   CHOL 137 02/21/2019   TRIG 93.0 02/21/2019   HDL 44.70 02/21/2019   LDLDIRECT 74.0 12/06/2015   LDLCALC 74 02/21/2019   ALT 10 02/21/2019   AST 14 02/21/2019   NA 138 02/21/2019   K 5.2 No hemolysis seen (H) 02/21/2019   CL 99 02/21/2019   CREATININE 1.13 02/21/2019   BUN 27 (H) 02/21/2019   CO2 32 02/21/2019   TSH 1.80 02/21/2019   HGBA1C 6.5 02/21/2019   MICROALBUR <0.7 09/01/2017      Assessment & Plan:

## 2019-10-20 NOTE — Assessment & Plan Note (Addendum)
stable overall by history and exam, recent data reviewed with pt, and pt to continue medical treatment as before,  to f/u any worsening symptoms or concerns  I spent 41 minutes in preparing to see the patient by review of recent labs, imaging and procedures, obtaining and reviewing separately obtained history, communicating with the patient and family or caregiver, ordering medications, tests or procedures, and documenting clinical information in the EHR including the differential Dx, treatment, and any further evaluation and other management of dementia, chf, dm, htn, hld and encounter for admit to assisted living

## 2019-10-20 NOTE — Assessment & Plan Note (Signed)
For PPD to be placed next wk, and forms filled out,  to f/u any worsening symptoms or concerns

## 2019-10-20 NOTE — Progress Notes (Signed)
Subjective:   Kathy Thomas is a 85 y.o. female who presents for Medicare Annual (Subsequent) preventive examination.  Review of Systems:  No ROS. Medicare Wellness Visit Cardiac Risk Factors include: advanced age (>20mn, >>60women);diabetes mellitus;dyslipidemia;family history of premature cardiovascular disease;obesity (BMI >30kg/m2)     Objective:     Vitals: BP 110/60 (BP Location: Right Arm, Patient Position: Sitting, Cuff Size: Large)   Pulse (!) 50   Temp 97.6 F (36.4 C)   Ht '5\' 1"'  (1.549 m)   Wt 207 lb (93.9 kg)   SpO2 95%   BMI 39.11 kg/m   Body mass index is 39.11 kg/m.  Advanced Directives 10/20/2019 07/05/2014 07/04/2014  Does Patient Have a Medical Advance Directive? Yes No No  Does patient want to make changes to medical advance directive? No - Patient declined - -  Would patient like information on creating a medical advance directive? - No - patient declined information -    Tobacco Social History   Tobacco Use  Smoking Status Former Smoker  . Packs/day: 0.00  . Types: Cigarettes  . Quit date: 06/29/2013  . Years since quitting: 6.3  Smokeless Tobacco Never Used     Counseling given: No   Clinical Intake:  Pre-visit preparation completed: Yes  Pain : No/denies pain Pain Score: 0-No pain     BMI - recorded: 39.11 Nutritional Status: BMI > 30  Obese Nutritional Risks: None Diabetes: Yes CBG done?: No Did pt. bring in CBG monitor from home?: No  What is the last grade level you completed in school?: HSG  Interpreter Needed?: No  Information entered by :: Shenika N. HLowell Guitar LPN  Past Medical History:  Diagnosis Date  . Acute sinusitis, unspecified 05/28/2008  . ANEMIA-IRON DEFICIENCY 10/26/2008  . ANXIETY 10/06/2007  . CHOLELITHIASIS 10/06/2007  . COLONIC POLYPS, HX OF 01/26/2007  . DIABETES MELLITUS, TYPE II 10/06/2007  . DIVERTICULOSIS, COLON 01/26/2007  . ECCHYMOSES, SPONTANEOUS 06/04/2010  . GERD 01/26/2007  . Heart murmur    slight  heart murmur  . HERNIATED DISC 01/26/2007  . HYPERLIPIDEMIA 10/06/2007  . HYPERTENSION 01/26/2007  . LOW BACK PAIN 01/29/2007  . MENOPAUSAL DISORDER 10/26/2008  . OSTEOPOROSIS 06/04/2010  . SHOULDER PAIN, LEFT 10/06/2007  . Stroke ([facility]    TIA's   Past Surgical History:  Procedure Laterality Date  . ABDOMINAL HYSTERECTOMY    . APPENDECTOMY    . Fracture left  wrist  2010   Dr. GAmedeo Plenty . Herniated disk   1995   C-spine  . Left ankle-screw placement  1980  . Left elbow-nerve impingement  1985  . TONSILLECTOMY     Family History  Problem Relation Age of Onset  . Heart disease Father   . Hyperlipidemia Other   . Stroke Other   . Hypertension Other   . Esophageal cancer Neg Hx   . Colon polyps Neg Hx   . Rectal cancer Neg Hx   . Stomach cancer Neg Hx    Social History   Socioeconomic History  . Marital status: Widowed    Spouse name: Not on file  . Number of children: Not on file  . Years of education: Not on file  . Highest education level: Not on file  Occupational History  . Not on file  Tobacco Use  . Smoking status: Former Smoker    Packs/day: 0.00    Types: Cigarettes    Quit date: 06/29/2013    Years since quitting: 6.3  .  Smokeless tobacco: Never Used  Substance and Sexual Activity  . Alcohol use: No  . Drug use: No  . Sexual activity: Not on file  Other Topics Concern  . Not on file  Social History Narrative  . Not on file   Social Determinants of Health   Financial Resource Strain:   . Difficulty of Paying Living Expenses:   Food Insecurity:   . Worried About Charity fundraiser in the Last Year:   . Arboriculturist in the Last Year:   Transportation Needs:   . Film/video editor (Medical):   Marland Kitchen Lack of Transportation (Non-Medical):   Physical Activity:   . Days of Exercise per Week:   . Minutes of Exercise per Session:   Stress:   . Feeling of Stress :   Social Connections:   . Frequency of Communication with Friends and Family:   . Frequency  of Social Gatherings with Friends and Family:   . Attends Religious Services:   . Active Member of Clubs or Organizations:   . Attends Archivist Meetings:   Marland Kitchen Marital Status:     Outpatient Encounter Medications as of 10/20/2019  Medication Sig  . alendronate (FOSAMAX) 70 MG tablet Take with a full glass of water on an empty stomach.  Marland Kitchen aspirin EC 81 MG tablet Take 1 tablet (81 mg total) by mouth daily.  Marland Kitchen atorvastatin (LIPITOR) 40 MG tablet Take 1 tablet (40 mg total) by mouth at bedtime. Annual appt due in Sept must see provider for future refills  . Blood Glucose Monitoring Suppl (ONE TOUCH ULTRA 2) w/Device KIT Use as directed twice per day E11.9  . Calcium 1200-1000 MG-UNIT CHEW Chew by mouth daily.    Marland Kitchen dicyclomine (BENTYL) 20 MG tablet Take 1 tablet (20 mg total) by mouth 2 (two) times daily. Patient needs office visit for further refills  . diphenoxylate-atropine (LOMOTIL) 2.5-0.025 MG tablet Take 1 tablet by mouth 4 (four) times daily as needed for diarrhea or loose stools.  . donepezil (ARICEPT) 10 MG tablet Take 1 tablet (10 mg total) by mouth daily.  Marland Kitchen glucose blood (ONE TOUCH ULTRA TEST) test strip Use as instructed twice per day E11.9  . Lancets MISC Use as directed twice per day E11.9  . losartan (COZAAR) 100 MG tablet Take 1 tablet (100 mg total) by mouth daily. Annual appt due in Sept must see provider for future refills  . NONFORMULARY OR COMPOUNDED ITEM Antifungal solution: Terbinafine 3%, Fluconazole 2%, Tea Tree Oil 5%, Urea 10%, Ibuprofen 2% in DMSO suspension #62m  . pioglitazone (ACTOS) 30 MG tablet Take 1 tablet (30 mg total) by mouth daily. Annual appt due in Sept must see provider for future refills  . vitamin B-12 1000 MCG tablet Take 1 tablet (1,000 mcg total) by mouth daily.  . Vitamin D, Ergocalciferol, (DRISDOL) 1.25 MG (50000 UT) CAPS capsule Take 1 capsule (50,000 Units total) by mouth every 7 (seven) days. (Patient not taking: Reported on  10/20/2019)   No facility-administered encounter medications on file as of 10/20/2019.    Activities of Daily Living In your present state of health, do you have any difficulty performing the following activities: 10/20/2019  Hearing? N  Vision? N  Difficulty concentrating or making decisions? Y  Walking or climbing stairs? Y  Dressing or bathing? Y  Doing errands, shopping? Y  Preparing Food and eating ? Y  Using the Toilet? Y  In the past six months, have you  accidently leaked urine? Y  Do you have problems with loss of bowel control? Y  Managing your Medications? Y  Managing your Finances? Y  Housekeeping or managing your Housekeeping? Y  Some recent data might be hidden    Patient Care Team: Biagio Borg, MD as PCP - General    Assessment:   This is a routine wellness examination for Julea.  Exercise Activities and Dietary recommendations Current Exercise Habits: The patient does not participate in regular exercise at present, Exercise limited by: cardiac condition(s);orthopedic condition(s)  Goals   None     Fall Risk Fall Risk  10/20/2019 02/21/2019 09/01/2017 04/07/2017 12/06/2015  Falls in the past year? 0 0 No Yes No  Number falls in past yr: 0 - - 1 -  Injury with Fall? 0 - - Yes -  Risk for fall due to : Impaired balance/gait - - - -  Follow up Falls evaluation completed;Education provided;Falls prevention discussed - - - -   Is the patient's home free of loose throw rugs in walkways, pet beds, electrical cords, etc?   yes      Grab bars in the bathroom? yes      Handrails on the stairs?   yes      Adequate lighting?   yes  Timed Get Up and Go performed: not indicated  Depression Screen PHQ 2/9 Scores 10/20/2019 02/21/2019 09/01/2017 04/07/2017  PHQ - 2 Score 0 0 0 0     Cognitive Function: not indicated        Immunization History  Administered Date(s) Administered  . Fluad Quad(high Dose 65+) 02/21/2019  . H1N1 04/26/2008  . Influenza Whole  08/04/2005, 03/01/2008, 03/05/2009  . Influenza, High Dose Seasonal PF 04/05/2015, 04/07/2017, 03/07/2018  . Influenza,inj,Quad PF,6+ Mos 02/07/2013, 01/10/2014  . Pneumococcal Conjugate-13 02/07/2013  . Pneumococcal Polysaccharide-23 08/25/2006  . Td 10/26/2008    Qualifies for Shingles Vaccine? Yes, will check with local pharmacy  Screening Tests Health Maintenance  Topic Date Due  . COVID-19 Vaccine (1) Never done  . OPHTHALMOLOGY EXAM  03/24/2019  . TETANUS/TDAP  02/21/2020 (Originally 10/27/2018)  . INFLUENZA VACCINE  12/24/2019  . FOOT EXAM  02/21/2020  . COLONOSCOPY  01/16/2023  . DEXA SCAN  Completed  . PNA vac Low Risk Adult  Completed    Cancer Screenings: Lung: Low Dose CT Chest recommended if Age 68-80 years, 30 pack-year currently smoking OR have quit w/in 15years. Patient does qualify. Breast:  Up to date on Mammogram? No; not indicated due to age Up to date of Bone Density/Dexa? No; not indicated due to age Colorectal: No; not indicated due to age  Additional Screenings: Hepatitis C Screening: never done     Plan:     Reviewed health maintenance screenings with patient today and relevant education, vaccines, and/or referrals were provided.    Continue doing brain stimulating activities (puzzles, reading, adult coloring books, staying active) to keep memory sharp.    Continue to eat heart healthy diet (full of fruits, vegetables, whole grains, lean protein, water--limit salt, fat, and sugar intake) and increase physical activity as tolerated.  I have personally reviewed and noted the following in the patient's chart:   . Medical and social history . Use of alcohol, tobacco or illicit drugs  . Current medications and supplements . Functional ability and status . Nutritional status . Physical activity . Advanced directives . List of other physicians . Hospitalizations, surgeries, and ER visits in previous 12 months . Vitals .  Screenings to include  cognitive, depression, and falls . Referrals and appointments  In addition, I have reviewed and discussed with patient certain preventive protocols, quality metrics, and best practice recommendations. A written personalized care plan for preventive services as well as general preventive health recommendations were provided to patient.     Sheral Flow, LPN  10/17/8946  Nurse Health Advisor

## 2019-10-20 NOTE — Patient Instructions (Addendum)
Please go to [email or web address] to schedule the vaccine appt  Ok to return next wk such as Tues or Wed about 2 PM for the PPD placement, and also:  Please go to the LAB at the blood drawing area for the tests to be done  You will be contacted by phone if any changes need to be made immediately.  Otherwise, you will receive a letter about your results with an explanation, but please check with MyChart first.  Please remember to sign up for MyChart if you have not done so, as this will be important to you in the future with finding out test results, communicating by private email, and scheduling acute appointments online when needed.  Please remember to call for the yearly Eye Exam, as well as Podaitry for the nails  Your form should be ready the week after next for the assisted living application  Please make an Appointment to return in 3 months, or sooner if needed

## 2019-10-25 ENCOUNTER — Ambulatory Visit: Payer: Medicare Other

## 2019-10-30 ENCOUNTER — Other Ambulatory Visit: Payer: Self-pay

## 2019-10-30 ENCOUNTER — Other Ambulatory Visit (INDEPENDENT_AMBULATORY_CARE_PROVIDER_SITE_OTHER): Payer: Medicare Other

## 2019-10-30 ENCOUNTER — Ambulatory Visit (INDEPENDENT_AMBULATORY_CARE_PROVIDER_SITE_OTHER): Payer: Medicare Other | Admitting: *Deleted

## 2019-10-30 ENCOUNTER — Other Ambulatory Visit: Payer: Self-pay | Admitting: Internal Medicine

## 2019-10-30 DIAGNOSIS — E559 Vitamin D deficiency, unspecified: Secondary | ICD-10-CM

## 2019-10-30 DIAGNOSIS — Z111 Encounter for screening for respiratory tuberculosis: Secondary | ICD-10-CM | POA: Diagnosis not present

## 2019-10-30 DIAGNOSIS — E119 Type 2 diabetes mellitus without complications: Secondary | ICD-10-CM | POA: Diagnosis not present

## 2019-10-30 DIAGNOSIS — Z23 Encounter for immunization: Secondary | ICD-10-CM

## 2019-10-30 LAB — LIPID PANEL
Cholesterol: 121 mg/dL (ref 0–200)
HDL: 38.6 mg/dL — ABNORMAL LOW (ref >39.00)
LDL Cholesterol: 69 mg/dL (ref 0–99)
NonHDL: 82.82
Total CHOL/HDL Ratio: 3
Triglycerides: 71 mg/dL (ref 0.0–149.0)
VLDL: 14.2 mg/dL (ref 0.0–40.0)

## 2019-10-30 LAB — BASIC METABOLIC PANEL
BUN: 29 mg/dL — ABNORMAL HIGH (ref 6–23)
CO2: 32 mEq/L (ref 19–32)
Calcium: 8.9 mg/dL (ref 8.4–10.5)
Chloride: 103 mEq/L (ref 96–112)
Creatinine, Ser: 1.1 mg/dL (ref 0.40–1.20)
GFR: 47.49 mL/min — ABNORMAL LOW (ref >60.00)
Glucose, Bld: 127 mg/dL — ABNORMAL HIGH (ref 70–99)
Potassium: 4.7 mEq/L (ref 3.5–5.1)
Sodium: 138 mEq/L (ref 135–145)

## 2019-10-30 LAB — HEPATIC FUNCTION PANEL
ALT: 15 U/L (ref 0–35)
AST: 15 U/L (ref 0–37)
Albumin: 3.6 g/dL (ref 3.5–5.2)
Alkaline Phosphatase: 77 U/L (ref 39–117)
Bilirubin, Direct: 0.1 mg/dL (ref 0.0–0.3)
Total Bilirubin: 0.3 mg/dL (ref 0.2–1.2)
Total Protein: 6.5 g/dL (ref 6.0–8.3)

## 2019-10-30 LAB — VITAMIN D 25 HYDROXY (VIT D DEFICIENCY, FRACTURES): VITD: 29.41 ng/mL — ABNORMAL LOW (ref 30.00–100.00)

## 2019-10-30 LAB — HEMOGLOBIN A1C: Hgb A1c MFr Bld: 6.2 % (ref 4.6–6.5)

## 2019-11-01 LAB — TB SKIN TEST
Induration: 0 mm
TB Skin Test: NEGATIVE

## 2019-12-15 ENCOUNTER — Other Ambulatory Visit: Payer: Self-pay | Admitting: Internal Medicine

## 2019-12-15 NOTE — Telephone Encounter (Signed)
Please refill as per office routine med refill policy (all routine meds refilled for 3 mo or monthly per pt preference up to one year from last visit, then month to month grace period for 3 mo, then further med refills will have to be denied)  

## 2020-01-25 ENCOUNTER — Telehealth: Payer: Self-pay | Admitting: Internal Medicine

## 2020-01-25 NOTE — Telephone Encounter (Signed)
Patient's daughter called and stated she just found out today that she is COVID +. Her mother the patient lives with her and just started to show sx today. She is going to take her to get tested. Once patient has been tested  And if she is +. Daughter would like to know if she need to have infusions or be referred to post COVID care clinic or anything like that. I informed the daughter once we have a + result the provider will advised.

## 2020-01-26 NOTE — Telephone Encounter (Signed)
Ok to let pt know about making an appt with the [facility] Covid clinic which is take care of outpatient and post hosp covid patients

## 2020-01-27 DIAGNOSIS — Z20822 Contact with and (suspected) exposure to covid-19: Secondary | ICD-10-CM | POA: Diagnosis not present

## 2020-01-31 ENCOUNTER — Emergency Department (HOSPITAL_COMMUNITY)
Admission: EM | Admit: 2020-01-31 | Discharge: 2020-01-31 | Disposition: A | Discharge status: Home / Self Care | Payer: Medicare Other | Source: Home / Self Care | Attending: Emergency Medicine | Admitting: Emergency Medicine

## 2020-01-31 ENCOUNTER — Encounter (HOSPITAL_COMMUNITY): Payer: Self-pay | Admitting: Emergency Medicine

## 2020-01-31 ENCOUNTER — Emergency Department (HOSPITAL_COMMUNITY): Payer: Medicare Other

## 2020-01-31 ENCOUNTER — Other Ambulatory Visit: Payer: Self-pay

## 2020-01-31 DIAGNOSIS — R41841 Cognitive communication deficit: Secondary | ICD-10-CM | POA: Diagnosis not present

## 2020-01-31 DIAGNOSIS — R498 Other voice and resonance disorders: Secondary | ICD-10-CM | POA: Diagnosis not present

## 2020-01-31 DIAGNOSIS — R0902 Hypoxemia: Secondary | ICD-10-CM | POA: Diagnosis not present

## 2020-01-31 DIAGNOSIS — Z23 Encounter for immunization: Secondary | ICD-10-CM | POA: Diagnosis not present

## 2020-01-31 DIAGNOSIS — M6281 Muscle weakness (generalized): Secondary | ICD-10-CM | POA: Diagnosis not present

## 2020-01-31 DIAGNOSIS — Z86010 Personal history of colonic polyps: Secondary | ICD-10-CM | POA: Diagnosis not present

## 2020-01-31 DIAGNOSIS — Z8249 Family history of ischemic heart disease and other diseases of the circulatory system: Secondary | ICD-10-CM | POA: Diagnosis not present

## 2020-01-31 DIAGNOSIS — Z8673 Personal history of transient ischemic attack (TIA), and cerebral infarction without residual deficits: Secondary | ICD-10-CM | POA: Diagnosis not present

## 2020-01-31 DIAGNOSIS — Z823 Family history of stroke: Secondary | ICD-10-CM | POA: Diagnosis not present

## 2020-01-31 DIAGNOSIS — E785 Hyperlipidemia, unspecified: Secondary | ICD-10-CM | POA: Diagnosis not present

## 2020-01-31 DIAGNOSIS — R0602 Shortness of breath: Secondary | ICD-10-CM | POA: Diagnosis not present

## 2020-01-31 DIAGNOSIS — U071 COVID-19: Secondary | ICD-10-CM | POA: Diagnosis not present

## 2020-01-31 DIAGNOSIS — R531 Weakness: Secondary | ICD-10-CM | POA: Diagnosis not present

## 2020-01-31 DIAGNOSIS — R000 Tachycardia, unspecified: Secondary | ICD-10-CM | POA: Diagnosis not present

## 2020-01-31 DIAGNOSIS — I959 Hypotension, unspecified: Secondary | ICD-10-CM | POA: Diagnosis not present

## 2020-01-31 DIAGNOSIS — Z880 Allergy status to penicillin: Secondary | ICD-10-CM | POA: Diagnosis not present

## 2020-01-31 DIAGNOSIS — E46 Unspecified protein-calorie malnutrition: Secondary | ICD-10-CM | POA: Diagnosis not present

## 2020-01-31 DIAGNOSIS — R5381 Other malaise: Secondary | ICD-10-CM | POA: Diagnosis not present

## 2020-01-31 DIAGNOSIS — E119 Type 2 diabetes mellitus without complications: Secondary | ICD-10-CM | POA: Diagnosis not present

## 2020-01-31 DIAGNOSIS — D696 Thrombocytopenia, unspecified: Secondary | ICD-10-CM | POA: Diagnosis present

## 2020-01-31 DIAGNOSIS — R2689 Other abnormalities of gait and mobility: Secondary | ICD-10-CM | POA: Diagnosis not present

## 2020-01-31 DIAGNOSIS — R404 Transient alteration of awareness: Secondary | ICD-10-CM | POA: Diagnosis not present

## 2020-01-31 DIAGNOSIS — Z6839 Body mass index (BMI) 39.0-39.9, adult: Secondary | ICD-10-CM | POA: Diagnosis not present

## 2020-01-31 DIAGNOSIS — R279 Unspecified lack of coordination: Secondary | ICD-10-CM | POA: Diagnosis not present

## 2020-01-31 DIAGNOSIS — I10 Essential (primary) hypertension: Secondary | ICD-10-CM | POA: Diagnosis not present

## 2020-01-31 DIAGNOSIS — J9601 Acute respiratory failure with hypoxia: Secondary | ICD-10-CM | POA: Diagnosis not present

## 2020-01-31 DIAGNOSIS — F0390 Unspecified dementia without behavioral disturbance: Secondary | ICD-10-CM | POA: Diagnosis not present

## 2020-01-31 DIAGNOSIS — Z743 Need for continuous supervision: Secondary | ICD-10-CM | POA: Diagnosis not present

## 2020-01-31 DIAGNOSIS — R0689 Other abnormalities of breathing: Secondary | ICD-10-CM | POA: Diagnosis not present

## 2020-01-31 DIAGNOSIS — Z90710 Acquired absence of both cervix and uterus: Secondary | ICD-10-CM | POA: Diagnosis not present

## 2020-01-31 DIAGNOSIS — E1169 Type 2 diabetes mellitus with other specified complication: Secondary | ICD-10-CM | POA: Diagnosis not present

## 2020-01-31 DIAGNOSIS — Z7984 Long term (current) use of oral hypoglycemic drugs: Secondary | ICD-10-CM | POA: Insufficient documentation

## 2020-01-31 DIAGNOSIS — J1282 Pneumonia due to coronavirus disease 2019: Secondary | ICD-10-CM | POA: Diagnosis not present

## 2020-01-31 DIAGNOSIS — R1312 Dysphagia, oropharyngeal phase: Secondary | ICD-10-CM | POA: Diagnosis not present

## 2020-01-31 DIAGNOSIS — F419 Anxiety disorder, unspecified: Secondary | ICD-10-CM | POA: Diagnosis not present

## 2020-01-31 DIAGNOSIS — Z79899 Other long term (current) drug therapy: Secondary | ICD-10-CM | POA: Insufficient documentation

## 2020-01-31 DIAGNOSIS — Z885 Allergy status to narcotic agent status: Secondary | ICD-10-CM | POA: Diagnosis not present

## 2020-01-31 DIAGNOSIS — Z87891 Personal history of nicotine dependence: Secondary | ICD-10-CM | POA: Insufficient documentation

## 2020-01-31 DIAGNOSIS — R001 Bradycardia, unspecified: Secondary | ICD-10-CM | POA: Diagnosis not present

## 2020-01-31 DIAGNOSIS — M6259 Muscle wasting and atrophy, not elsewhere classified, multiple sites: Secondary | ICD-10-CM | POA: Diagnosis not present

## 2020-01-31 DIAGNOSIS — Z7982 Long term (current) use of aspirin: Secondary | ICD-10-CM | POA: Insufficient documentation

## 2020-01-31 LAB — COMPREHENSIVE METABOLIC PANEL
ALT: 22 U/L (ref 0–44)
AST: 27 U/L (ref 15–41)
Albumin: 3.1 g/dL — ABNORMAL LOW (ref 3.5–5.0)
Alkaline Phosphatase: 58 U/L (ref 38–126)
Anion gap: 9 (ref 5–15)
BUN: 29 mg/dL — ABNORMAL HIGH (ref 8–23)
CO2: 28 mmol/L (ref 22–32)
Calcium: 8.3 mg/dL — ABNORMAL LOW (ref 8.9–10.3)
Chloride: 101 mmol/L (ref 98–111)
Creatinine, Ser: 1.08 mg/dL — ABNORMAL HIGH (ref 0.44–1.00)
GFR calc Af Amer: 55 mL/min — ABNORMAL LOW (ref >60)
GFR calc non Af Amer: 48 mL/min — ABNORMAL LOW (ref >60)
Glucose, Bld: 93 mg/dL (ref 70–99)
Potassium: 4 mmol/L (ref 3.5–5.1)
Sodium: 138 mmol/L (ref 135–145)
Total Bilirubin: 0.8 mg/dL (ref 0.3–1.2)
Total Protein: 6.6 g/dL (ref 6.5–8.1)

## 2020-01-31 LAB — CBC WITH DIFFERENTIAL/PLATELET
Abs Immature Granulocytes: 0.01 10*3/uL (ref 0.00–0.07)
Basophils Absolute: 0 10*3/uL (ref 0.0–0.1)
Basophils Relative: 0 %
Eosinophils Absolute: 0 10*3/uL (ref 0.0–0.5)
Eosinophils Relative: 0 %
HCT: 42.1 % (ref 36.0–46.0)
Hemoglobin: 13.2 g/dL (ref 12.0–15.0)
Immature Granulocytes: 0 %
Lymphocytes Relative: 33 %
Lymphs Abs: 1 10*3/uL (ref 0.7–4.0)
MCH: 30.5 pg (ref 26.0–34.0)
MCHC: 31.4 g/dL (ref 30.0–36.0)
MCV: 97.2 fL (ref 80.0–100.0)
Monocytes Absolute: 0.5 10*3/uL (ref 0.1–1.0)
Monocytes Relative: 16 %
Neutro Abs: 1.5 10*3/uL — ABNORMAL LOW (ref 1.7–7.7)
Neutrophils Relative %: 51 %
Platelets: 93 10*3/uL — ABNORMAL LOW (ref 150–400)
RBC: 4.33 MIL/uL (ref 3.87–5.11)
RDW: 12.9 % (ref 11.5–15.5)
WBC: 2.9 10*3/uL — ABNORMAL LOW (ref 4.0–10.5)
nRBC: 0 % (ref 0.0–0.2)

## 2020-01-31 LAB — LACTIC ACID, PLASMA: Lactic Acid, Venous: 0.6 mmol/L (ref 0.5–1.9)

## 2020-01-31 LAB — TRIGLYCERIDES: Triglycerides: 63 mg/dL (ref <150)

## 2020-01-31 LAB — LACTATE DEHYDROGENASE: LDH: 215 U/L — ABNORMAL HIGH (ref 98–192)

## 2020-01-31 LAB — FIBRINOGEN: Fibrinogen: 483 mg/dL — ABNORMAL HIGH (ref 210–475)

## 2020-01-31 LAB — FERRITIN: Ferritin: 145 ng/mL (ref 11–307)

## 2020-01-31 LAB — SARS CORONAVIRUS 2 BY RT PCR (HOSPITAL ORDER, PERFORMED IN ~~LOC~~ HOSPITAL LAB): SARS Coronavirus 2: POSITIVE — AB

## 2020-01-31 LAB — C-REACTIVE PROTEIN: CRP: 3.2 mg/dL — ABNORMAL HIGH (ref <1.0)

## 2020-01-31 LAB — D-DIMER, QUANTITATIVE: D-Dimer, Quant: 1.77 ug/mL-FEU — ABNORMAL HIGH (ref 0.00–0.50)

## 2020-01-31 LAB — PROCALCITONIN: Procalcitonin: <0.1 ng/mL

## 2020-01-31 MED ORDER — SODIUM CHLORIDE 0.9 % IV SOLN: INTRAVENOUS | Status: DC | PRN

## 2020-01-31 MED ORDER — METHYLPREDNISOLONE SODIUM SUCC 125 MG IJ SOLR: 125.0000 mg | Freq: Once | INTRAMUSCULAR | Status: DC | PRN

## 2020-01-31 MED ORDER — SODIUM CHLORIDE 0.9 % IV SOLN
1200.0000 mg | Freq: Once | INTRAVENOUS | Status: AC
  Administered 2020-01-31: 1200 mg via INTRAVENOUS
  Filled 2020-01-31: qty 10

## 2020-01-31 MED ORDER — DIPHENHYDRAMINE HCL 50 MG/ML IJ SOLN: 50.0000 mg | Freq: Once | INTRAMUSCULAR | Status: DC | PRN

## 2020-01-31 MED ORDER — EPINEPHRINE 0.3 MG/0.3ML IJ SOAJ: 0.3000 mg | Freq: Once | INTRAMUSCULAR | Status: DC | PRN

## 2020-01-31 MED ORDER — FAMOTIDINE IN NACL 20-0.9 MG/50ML-% IV SOLN: 20.0000 mg | Freq: Once | INTRAVENOUS | Status: DC | PRN

## 2020-01-31 MED ORDER — ALBUTEROL SULFATE HFA 108 (90 BASE) MCG/ACT IN AERS: 2.0000 | INHALATION_SPRAY | Freq: Once | RESPIRATORY_TRACT | Status: DC | PRN

## 2020-01-31 NOTE — Telephone Encounter (Signed)
Kathy Thomas (pt's daughter) pt tested COVID + x 4 days ago with cough, denies fever or SOB.  Pt O2 sat 92-94% with chest PT & deep breathes.  Kathy Thomas states it is "unrealistic to take her to the infusion center" b/c the patient is sedentary; daughter requests supplemental oxygen.  Will send this request to Dr Jonny Ruiz.

## 2020-01-31 NOTE — Telephone Encounter (Signed)
Needs to call [facility] EMS for transport to ED as she will likely need admit for COVID pna and acute hypoxic respiratory failure, as we do not do home oxygen by email or virtual visits, and she will also need inhaler therapy, decadron therapy, lovenox for blood clot prevention and remdesivr  Pt should go now now now now

## 2020-01-31 NOTE — ED Triage Notes (Signed)
85 yo female BIBA from home. Pt COVID as last Saturday per pts daughter. Pts daughter called EMS for low o2 sats. On arrival pt 91% pt placed on 2L o2 and is now at 95% [place] per EMS. Pt has no other complaints at this time per EMS. Pts daughter states the doctor sent them to the ED for monoclonal antibody infusion. Pt has a history of dementia, pt alert to self at baseline.  Vitals: 116/40 Hr 45 rr 18 cbg 96 spo2 95% on 2L [place]

## 2020-01-31 NOTE — ED Notes (Signed)
Pt CXR at bedside

## 2020-01-31 NOTE — Telephone Encounter (Signed)
It appears the o2 sat is not less than 90 as I misunderstood the original message  Ok to continue to monitor o2 sat at home, to go to ED if lower than 90-92, and no need for home oxygen at this time  I can refer to [facility] but this would not include home oxygen

## 2020-01-31 NOTE — Telephone Encounter (Signed)
  Follow up message  Please return call to daughter, she would like to provide additional information

## 2020-01-31 NOTE — Telephone Encounter (Signed)
   Patient's daughter calling upset and disrespectful , states "it doesn't make sense to send her mother to COVID Clinic",requesting order for home oxygen today, no preference of company. Requesting call back from Dr Jonny Ruiz

## 2020-01-31 NOTE — ED Notes (Signed)
Pt blood pressure cuff moved from forearm to upper arm and cuff size changed to larger size

## 2020-01-31 NOTE — Telephone Encounter (Signed)
Patient's daughter, Roselyn Reef, notified of Dr Gwynn Burly response of : "Needs to call 911 EMS for transport to ED as she will likely need admit for COVID pna and acute hypoxic respiratory failure, as we do not do home oxygen by email or virtual visits, and she will also need inhaler therapy, decadron therapy, lovenox for blood clot prevention and remdesivr" Pt's daughter does not want to take pt to the ED, desires home health referral for oxygen instead.  She states she does not think her mother requires an ED visit as "she is not symptomatic" & she does not want to take her "around all that COVID". Discussed the post covid clinic recommendation that was made 5 days ago & daughter states she is unable to get pt clinic.  Daughter is upset b/c she believes that her mother's care is not being met b/c all she is requesting is a home health referral. At this time, daughter is refusing recommendation of EMS.   Will forward to Dr Jenny Reichmann.

## 2020-01-31 NOTE — ED Notes (Signed)
Spoke with pts daughter [name] > she states she prefers to pick her mom up, instead of waititing for [employer]

## 2020-01-31 NOTE — Telephone Encounter (Signed)
Daughter called to inform RN/MD that she called EMS per Dr Raphael Gibney prior recommendation.  States that ER doctor's states that pt o2 sat now 96% (from 92% per paramedics), that she will be admitted if hypoxic episode noted, but the plan is to give MAB, evaluate, send home.  Jamie requesting HH to come after mom return home.  Dr Raphael Gibney recommendation "It appears the o2 sat is not less than 90 as I misunderstood the original message.  Ok to continue to monitor o2 sat at home, to go to ED if lower than 90-92, and no need for home oxygen at this time.  I can refer to Home Health but this would not include home oxygen. Also remember HH cannot be done wihtou being seen within 90 day.  Also HH will not come to the home for 2 wks after covid + diagnosis" read to pt.  Pt verb understanding.

## 2020-01-31 NOTE — ED Provider Notes (Signed)
[facility] EMERGENCY DEPARTMENT Provider Note  CSN: [id number] Arrival date & time: 01/31/20 1604    History Chief Complaint  Patient presents with  . covid +    HPI History provided by daughter, patient has dementia.  Kathy Thomas is a 85 y.o. female with multiple medical problems has had a mild cough for about a week. Daughter had been having symptoms prior to that and tested positive for Covid. Patient herself tested positive on 8/4. Daughter has been checking her SpO2 and had noticed some low readings in the 80s but quickly recovers after deep breathing and coughing. Has been holding steady around 92% for the most part. The patient has not had any significant symptoms but daughter reports that she has seemed to be more confused at baseline. No significant cough, no vomiting. Occasional diarrhea. No fever until today, when it was 100.61F. Given APAP prior to arrival. Patient is not vaccinated. Per EMS, patient's SpO2 was 91% on RA on their arrival, placed on supplemental O2 enroute.    Past Medical History:  Diagnosis Date  . Acute sinusitis, unspecified 05/28/2008  . ANEMIA-IRON DEFICIENCY 10/26/2008  . ANXIETY 10/06/2007  . CHOLELITHIASIS 10/06/2007  . COLONIC POLYPS, HX OF 01/26/2007  . DIABETES MELLITUS, TYPE II 10/06/2007  . DIVERTICULOSIS, COLON 01/26/2007  . ECCHYMOSES, SPONTANEOUS 06/04/2010  . GERD 01/26/2007  . Heart murmur    slight heart murmur  . HERNIATED DISC 01/26/2007  . HYPERLIPIDEMIA 10/06/2007  . HYPERTENSION 01/26/2007  . LOW BACK PAIN 01/29/2007  . MENOPAUSAL DISORDER 10/26/2008  . OSTEOPOROSIS 06/04/2010  . SHOULDER PAIN, LEFT 10/06/2007  . Stroke [facility])    TIA's    Past Surgical History:  Procedure Laterality Date  . ABDOMINAL HYSTERECTOMY    . APPENDECTOMY    . Fracture left  wrist  2010   Dr. Amedeo Plenty  . Herniated disk   1995   C-spine  . Left ankle-screw placement  1980  . Left elbow-nerve impingement  1985  . TONSILLECTOMY      Family History  Problem  Relation Age of Onset  . Heart disease Father   . Hyperlipidemia Other   . Stroke Other   . Hypertension Other   . Esophageal cancer Neg Hx   . Colon polyps Neg Hx   . Rectal cancer Neg Hx   . Stomach cancer Neg Hx     Social History   Tobacco Use  . Smoking status: Former Smoker    Packs/day: 0.00    Types: Cigarettes    Quit date: 06/29/2013    Years since quitting: 6.5  . Smokeless tobacco: Never Used  Vaping Use  . Vaping Use: Never used  Substance Use Topics  . Alcohol use: No  . Drug use: No     Home Medications Prior to Admission medications   Medication Sig Start Date End Date Taking? Authorizing Provider  alendronate (FOSAMAX) 70 MG tablet Take with a full glass of water on an empty stomach. 08/16/19   Biagio Borg, MD  aspirin EC 81 MG tablet Take 1 tablet (81 mg total) by mouth daily. 09/08/13   Biagio Borg, MD  atorvastatin (LIPITOR) 40 MG tablet TAKE ONE TABLET BY MOUTH EVERYDAY AT BEDTIME 12/15/19   Biagio Borg, MD  Blood Glucose Monitoring Suppl (ONE TOUCH ULTRA 2) w/Device KIT Use as directed twice per day E11.9 03/07/18   Biagio Borg, MD  Calcium 1200-1000 MG-UNIT CHEW Chew by mouth daily.  [provider]  CALCIUM CITRATE-VITAMIN D3 PO Take by mouth.    [provider]  dicyclomine (BENTYL) 20 MG tablet Take 1 tablet (20 mg total) by mouth 2 (two) times daily. 10/20/19   Biagio Borg, MD  diphenoxylate-atropine (LOMOTIL) 2.5-0.025 MG tablet Take 1 tablet by mouth 4 (four) times daily as needed for diarrhea or loose stools. 03/07/18   Biagio Borg, MD  donepezil (ARICEPT) 10 MG tablet Take 1 tablet (10 mg total) by mouth daily. 08/16/19   Biagio Borg, MD  glucose blood (ONE TOUCH ULTRA TEST) test strip Use as instructed twice per day E11.9 03/07/18   Biagio Borg, MD  Lancets MISC Use as directed twice per day E11.9 03/07/18   Biagio Borg, MD  losartan (COZAAR) 100 MG tablet Take 1 tablet (100 mg total) by mouth daily. Annual  appt due in Sept must see provider for future refills 08/16/19   Biagio Borg, MD  NONFORMULARY OR COMPOUNDED ITEM Antifungal solution: Terbinafine 3%, Fluconazole 2%, Tea Tree Oil 5%, Urea 10%, Ibuprofen 2% in DMSO suspension #62m 02/24/19   GMarzetta Board DPM  pioglitazone (ACTOS) 30 MG tablet TAKE ONE TABLET BY MOUTH EVERY MORNING 12/15/19   JBiagio Borg MD  vitamin B-12 1000 MCG tablet Take 1 tablet (1,000 mcg total) by mouth daily. 07/08/14   RNat Math MD     Allergies    Codeine, Penicillins, and Morphine   Review of Systems   Review of Systems Unable to assess due to mental status.    Physical Exam BP 132/86   Pulse (!) 50   Temp 97.9 F (36.6 C) (Oral)   Resp 19   SpO2 95%   Physical Exam Vitals and nursing note reviewed.  Constitutional:      Appearance: Normal appearance.  HENT:     Head: Normocephalic and atraumatic.     Nose: Nose normal.     Mouth/Throat:     Mouth: Mucous membranes are moist.  Eyes:     Extraocular Movements: Extraocular movements intact.     Conjunctiva/sclera: Conjunctivae normal.  Cardiovascular:     Rate and Rhythm: Normal rate.  Pulmonary:     Effort: Pulmonary effort is normal.     Breath sounds: Normal breath sounds.  Abdominal:     General: Abdomen is flat.     Palpations: Abdomen is soft.     Tenderness: There is no abdominal tenderness.  Musculoskeletal:        General: No swelling. Normal range of motion.     Cervical back: Neck supple.  Skin:    General: Skin is warm and dry.  Neurological:     General: No focal deficit present.     Mental Status: She is alert. She is disoriented.  Psychiatric:        Mood and Affect: Mood normal.      ED Results / Procedures / Treatments   Labs (all labs ordered are listed, but only abnormal results are displayed) Labs Reviewed  SARS CORONAVIRUS 2 BY RT PCR ([facility]) - Abnormal; Notable for the following components:       Result Value   SARS Coronavirus 2 POSITIVE (*)    All other components within normal limits  COMPREHENSIVE METABOLIC PANEL - Abnormal; Notable for the following components:   BUN 29 (*)    Creatinine, Ser 1.08 (*)    Calcium 8.3 (*)    Albumin  3.1 (*)    GFR calc non Af Amer 48 (*)    GFR calc Af Amer 55 (*)    All other components within normal limits  D-DIMER, QUANTITATIVE (NOT AT [facility]) - Abnormal; Notable for the following components:   D-Dimer, Quant 1.77 (*)    All other components within normal limits  LACTATE DEHYDROGENASE - Abnormal; Notable for the following components:   LDH 215 (*)    All other components within normal limits  FIBRINOGEN - Abnormal; Notable for the following components:   Fibrinogen 483 (*)    All other components within normal limits  C-REACTIVE PROTEIN - Abnormal; Notable for the following components:   CRP 3.2 (*)    All other components within normal limits  CBC WITH DIFFERENTIAL/PLATELET - Abnormal; Notable for the following components:   WBC 2.9 (*)    Platelets 93 (*)    Neutro Abs 1.5 (*)    All other components within normal limits  CULTURE, BLOOD (ROUTINE X 2)  CULTURE, BLOOD (ROUTINE X 2)  LACTIC ACID, PLASMA  PROCALCITONIN  FERRITIN  TRIGLYCERIDES  LACTIC ACID, PLASMA  CBC WITH DIFFERENTIAL/PLATELET    EKG EKG Interpretation  Date/Time:  Wednesday January 31 2020 16:40:08 EDT Ventricular Rate:  46 PR Interval:    QRS Duration: 153 QT Interval:  579 QTC Calculation: 507 R Axis:   -21 Text Interpretation: Sinus bradycardia Left bundle branch block No significant change since last tracing Confirmed by Calvert Cantor [id number]) on 01/31/2020 5:33:41 PM   Radiology DG Chest Port 1 View  Result Date: 01/31/2020 CLINICAL DATA:  COVID-19 positive 5 days ago, hypoxia EXAM: PORTABLE CHEST 1 VIEW COMPARISON:  06/25/2016 FINDINGS: Single frontal view of the chest demonstrates an unremarkable cardiac silhouette. Diffuse interstitial  prominence unchanged. No airspace disease, effusion, or pneumothorax. No acute bony abnormalities. IMPRESSION: 1. No acute intrathoracic process. Electronically Signed   By: Randa Ngo M.D.   On: 01/31/2020 17:27    Procedures Procedures  Medications Ordered in the ED Medications  0.9 %  sodium chloride infusion (has no administration in time range)  diphenhydrAMINE (BENADRYL) injection 50 mg (has no administration in time range)  famotidine (PEPCID) IVPB 20 mg premix (has no administration in time range)  methylPREDNISolone sodium succinate (SOLU-MEDROL) 125 mg/2 mL injection 125 mg (has no administration in time range)  albuterol (VENTOLIN HFA) 108 (90 Base) MCG/ACT inhaler 2 puff (has no administration in time range)  EPINEPHrine (EPI-PEN) injection 0.3 mg (has no administration in time range)  casirivimab-imdevimab (REGEN-COV) 1,200 mg in sodium chloride 0.9 % 110 mL IVPB (0 mg Intravenous Stopped 01/31/20 2053)     MDM Rules/Calculators/A&P MDM Patient with positive Cov[facility] at [facility], unable to verify independently. Reportedly hypoxic at home but not on arrival here. Will check labs, CXR and monitor SpO2.  ED Course  I have reviewed the triage vital signs and the nursing notes.  Pertinent labs & imaging results that were available during my care of the patient were reviewed by me and considered in my medical decision making (see chart for details).  Clinical Course as of Jan 31 2208  Wed Jan 31, 2020  1747 CXR is clear, no signs of significant pneumonia.    [CS]  1841 CMP is unremarkable. Lactic acid is normal.    [CS]  1934 Covid confirmed positive. Inflammatory markers mildly elevated but she has not been hypoxic <90% in the ED. Will give her MAB treatment and continue to observe.    [CS]  2009 CBC with mild leukopenia, no significant neutropenia.    [CS]  2019 Updated daughter on results so far, plan for MAB and likely discharge if no significant reaction.    [CS]  2209  Kathy Thomas was evaluated in Emergency Department on 01/31/2020 for the symptoms described in the history of present illness. She was evaluated in the context of the global COVID-19 pandemic, which necessitated consideration that the patient might be at risk for infection with the SARS-CoV-2 virus that causes COVID-19. Institutional protocols and algorithms that pertain to the evaluation of patients at risk for COVID-19 are in a state of rapid change based on information released by regulatory bodies including the CDC and federal and state organizations. These policies and algorithms were followed during the patient's care in the ED.     [CS]    Clinical Course User Index [CS] Truddie Hidden, MD    Final Clinical Impression(s) / ED Diagnoses Final diagnoses:  [id number]    Rx / DC Orders ED Discharge Orders    None       Truddie Hidden, MD 01/31/20 2209

## 2020-02-01 ENCOUNTER — Inpatient Hospital Stay (HOSPITAL_COMMUNITY): Payer: Medicare Other

## 2020-02-01 ENCOUNTER — Inpatient Hospital Stay (HOSPITAL_COMMUNITY)
Admission: EM | Admit: 2020-02-01 | Discharge: 2020-02-05 | DRG: 177 | Disposition: A | Discharge status: Skilled Nursing Facility | Payer: Medicare Other | Attending: Internal Medicine | Admitting: Internal Medicine

## 2020-02-01 ENCOUNTER — Encounter (HOSPITAL_COMMUNITY): Payer: Self-pay | Admitting: Internal Medicine

## 2020-02-01 DIAGNOSIS — E785 Hyperlipidemia, unspecified: Secondary | ICD-10-CM | POA: Diagnosis present

## 2020-02-01 DIAGNOSIS — R531 Weakness: Secondary | ICD-10-CM | POA: Diagnosis not present

## 2020-02-01 DIAGNOSIS — Z6839 Body mass index (BMI) 39.0-39.9, adult: Secondary | ICD-10-CM

## 2020-02-01 DIAGNOSIS — Z8249 Family history of ischemic heart disease and other diseases of the circulatory system: Secondary | ICD-10-CM | POA: Diagnosis not present

## 2020-02-01 DIAGNOSIS — F419 Anxiety disorder, unspecified: Secondary | ICD-10-CM | POA: Diagnosis present

## 2020-02-01 DIAGNOSIS — U071 COVID-19: Secondary | ICD-10-CM | POA: Diagnosis present

## 2020-02-01 DIAGNOSIS — I10 Essential (primary) hypertension: Secondary | ICD-10-CM | POA: Diagnosis present

## 2020-02-01 DIAGNOSIS — Z7982 Long term (current) use of aspirin: Secondary | ICD-10-CM | POA: Diagnosis not present

## 2020-02-01 DIAGNOSIS — F0390 Unspecified dementia without behavioral disturbance: Secondary | ICD-10-CM | POA: Diagnosis present

## 2020-02-01 DIAGNOSIS — Z880 Allergy status to penicillin: Secondary | ICD-10-CM | POA: Diagnosis not present

## 2020-02-01 DIAGNOSIS — E46 Unspecified protein-calorie malnutrition: Secondary | ICD-10-CM | POA: Diagnosis present

## 2020-02-01 DIAGNOSIS — J9601 Acute respiratory failure with hypoxia: Secondary | ICD-10-CM | POA: Diagnosis present

## 2020-02-01 DIAGNOSIS — Z79899 Other long term (current) drug therapy: Secondary | ICD-10-CM

## 2020-02-01 DIAGNOSIS — E119 Type 2 diabetes mellitus without complications: Secondary | ICD-10-CM | POA: Diagnosis present

## 2020-02-01 DIAGNOSIS — E1169 Type 2 diabetes mellitus with other specified complication: Secondary | ICD-10-CM

## 2020-02-01 DIAGNOSIS — Z8673 Personal history of transient ischemic attack (TIA), and cerebral infarction without residual deficits: Secondary | ICD-10-CM

## 2020-02-01 DIAGNOSIS — Z86010 Personal history of colonic polyps: Secondary | ICD-10-CM

## 2020-02-01 DIAGNOSIS — D696 Thrombocytopenia, unspecified: Secondary | ICD-10-CM | POA: Diagnosis present

## 2020-02-01 DIAGNOSIS — Z90710 Acquired absence of both cervix and uterus: Secondary | ICD-10-CM

## 2020-02-01 DIAGNOSIS — Z823 Family history of stroke: Secondary | ICD-10-CM

## 2020-02-01 DIAGNOSIS — J1282 Pneumonia due to coronavirus disease 2019: Secondary | ICD-10-CM | POA: Diagnosis present

## 2020-02-01 DIAGNOSIS — Z885 Allergy status to narcotic agent status: Secondary | ICD-10-CM | POA: Diagnosis not present

## 2020-02-01 LAB — BLOOD CULTURE ID PANEL (REFLEXED) - BCID2

## 2020-02-01 LAB — CBC WITH DIFFERENTIAL/PLATELET
Abs Immature Granulocytes: 0.02 10*3/uL (ref 0.00–0.07)
Basophils Absolute: 0 10*3/uL (ref 0.0–0.1)
Basophils Relative: 0 %
Eosinophils Absolute: 0 10*3/uL (ref 0.0–0.5)
Eosinophils Relative: 0 %
HCT: 41.8 % (ref 36.0–46.0)
Hemoglobin: 13 g/dL (ref 12.0–15.0)
Immature Granulocytes: 0 %
Lymphocytes Relative: 16 %
Lymphs Abs: 0.9 10*3/uL (ref 0.7–4.0)
MCH: 30.2 pg (ref 26.0–34.0)
MCHC: 31.1 g/dL (ref 30.0–36.0)
MCV: 97.2 fL (ref 80.0–100.0)
Monocytes Absolute: 0.4 10*3/uL (ref 0.1–1.0)
Monocytes Relative: 7 %
Neutro Abs: 4.1 10*3/uL (ref 1.7–7.7)
Neutrophils Relative %: 77 %
Platelets: 102 10*3/uL — ABNORMAL LOW (ref 150–400)
RBC: 4.3 MIL/uL (ref 3.87–5.11)
RDW: 12.8 % (ref 11.5–15.5)
WBC: 5.4 10*3/uL (ref 4.0–10.5)
nRBC: 0 % (ref 0.0–0.2)

## 2020-02-01 LAB — BASIC METABOLIC PANEL
Anion gap: 8 (ref 5–15)
BUN: 23 mg/dL (ref 8–23)
CO2: 29 mmol/L (ref 22–32)
Calcium: 8.1 mg/dL — ABNORMAL LOW (ref 8.9–10.3)
Chloride: 101 mmol/L (ref 98–111)
Creatinine, Ser: 0.9 mg/dL (ref 0.44–1.00)
GFR calc Af Amer: >60 mL/min (ref >60)
GFR calc non Af Amer: 60 mL/min — ABNORMAL LOW (ref >60)
Glucose, Bld: 154 mg/dL — ABNORMAL HIGH (ref 70–99)
Potassium: 4.2 mmol/L (ref 3.5–5.1)
Sodium: 138 mmol/L (ref 135–145)

## 2020-02-01 LAB — GLUCOSE, CAPILLARY
Glucose-Capillary: 172 mg/dL — ABNORMAL HIGH (ref 70–99)
Glucose-Capillary: 250 mg/dL — ABNORMAL HIGH (ref 70–99)

## 2020-02-01 LAB — CBG MONITORING, ED: Glucose-Capillary: 173 mg/dL — ABNORMAL HIGH (ref 70–99)

## 2020-02-01 MED ORDER — ASPIRIN EC 81 MG PO TBEC
81.0000 mg | DELAYED_RELEASE_TABLET | Freq: Every day | ORAL | Status: DC
  Administered 2020-02-01 – 2020-02-05 (×2): 81 mg via ORAL
  Filled 2020-02-01 (×4): qty 1

## 2020-02-01 MED ORDER — IOHEXOL 350 MG/ML SOLN
100.0000 mL | Freq: Once | INTRAVENOUS | Status: AC | PRN
  Administered 2020-02-01: 100 mL via INTRAVENOUS

## 2020-02-01 MED ORDER — GUAIFENESIN-DM 100-10 MG/5ML PO SYRP: 10.0000 mL | ORAL_SOLUTION | ORAL | Status: DC | PRN

## 2020-02-01 MED ORDER — CALCIUM GLUCONATE-NACL 1-0.675 GM/50ML-% IV SOLN
1.0000 g | Freq: Once | INTRAVENOUS | Status: AC
  Administered 2020-02-01: 1000 mg via INTRAVENOUS
  Filled 2020-02-01: qty 50

## 2020-02-01 MED ORDER — PREDNISONE 20 MG PO TABS
50.0000 mg | ORAL_TABLET | Freq: Every day | ORAL | Status: DC
  Administered 2020-02-04 – 2020-02-05 (×2): 50 mg via ORAL
  Filled 2020-02-01 (×2): qty 2

## 2020-02-01 MED ORDER — CALCIUM CARBONATE-VITAMIN D 500-200 MG-UNIT PO TABS
1.0000 | ORAL_TABLET | Freq: Every day | ORAL | Status: DC
  Administered 2020-02-01 – 2020-02-05 (×5): 1 via ORAL
  Filled 2020-02-01 (×5): qty 1

## 2020-02-01 MED ORDER — ENOXAPARIN SODIUM 40 MG/0.4ML ~~LOC~~ SOLN
40.0000 mg | SUBCUTANEOUS | Status: DC
  Administered 2020-02-01 – 2020-02-04 (×4): 40 mg via SUBCUTANEOUS
  Filled 2020-02-01 (×4): qty 0.4

## 2020-02-01 MED ORDER — METHYLPREDNISOLONE SODIUM SUCC 125 MG IJ SOLR
0.5000 mg/kg | Freq: Two times a day (BID) | INTRAMUSCULAR | Status: AC
  Administered 2020-02-01 – 2020-02-03 (×6): 46.875 mg via INTRAVENOUS
  Filled 2020-02-01 (×6): qty 2

## 2020-02-01 MED ORDER — LOSARTAN POTASSIUM 50 MG PO TABS
100.0000 mg | ORAL_TABLET | Freq: Every day | ORAL | Status: DC
  Filled 2020-02-01: qty 2

## 2020-02-01 MED ORDER — ALBUTEROL SULFATE HFA 108 (90 BASE) MCG/ACT IN AERS
2.0000 | INHALATION_SPRAY | Freq: Four times a day (QID) | RESPIRATORY_TRACT | Status: DC
  Administered 2020-02-01 – 2020-02-05 (×17): 2 via RESPIRATORY_TRACT
  Filled 2020-02-01: qty 6.7

## 2020-02-01 MED ORDER — DONEPEZIL HCL 5 MG PO TABS
10.0000 mg | ORAL_TABLET | Freq: Every day | ORAL | Status: DC
  Administered 2020-02-01: 10 mg via ORAL
  Filled 2020-02-01: qty 2

## 2020-02-01 MED ORDER — INSULIN ASPART 100 UNIT/ML ~~LOC~~ SOLN
0.0000 [IU] | Freq: Three times a day (TID) | SUBCUTANEOUS | Status: DC
  Administered 2020-02-01 (×2): 2 [IU] via SUBCUTANEOUS
  Administered 2020-02-02: 1 [IU] via SUBCUTANEOUS
  Administered 2020-02-02: 5 [IU] via SUBCUTANEOUS
  Administered 2020-02-02 – 2020-02-03 (×2): 3 [IU] via SUBCUTANEOUS
  Administered 2020-02-03: 9 [IU] via SUBCUTANEOUS
  Administered 2020-02-03: 3 [IU] via SUBCUTANEOUS
  Administered 2020-02-04: 7 [IU] via SUBCUTANEOUS
  Administered 2020-02-04 (×2): 3 [IU] via SUBCUTANEOUS
  Administered 2020-02-05 (×2): 1 [IU] via SUBCUTANEOUS
  Filled 2020-02-01: qty 0.09

## 2020-02-01 MED ORDER — GLUCERNA SHAKE PO LIQD
237.0000 mL | Freq: Three times a day (TID) | ORAL | Status: DC
  Administered 2020-02-01 – 2020-02-05 (×11): 237 mL via ORAL
  Filled 2020-02-01 (×14): qty 237

## 2020-02-01 MED ORDER — ATORVASTATIN CALCIUM 40 MG PO TABS
40.0000 mg | ORAL_TABLET | Freq: Every day | ORAL | Status: DC
  Administered 2020-02-01 – 2020-02-05 (×5): 40 mg via ORAL
  Filled 2020-02-01 (×5): qty 1

## 2020-02-01 MED ORDER — ACETAMINOPHEN 500 MG PO TABS
1000.0000 mg | ORAL_TABLET | Freq: Once | ORAL | Status: AC
  Administered 2020-02-01: 1000 mg via ORAL
  Filled 2020-02-01: qty 2

## 2020-02-01 MED ORDER — VITAMIN B-12 1000 MCG PO TABS
1000.0000 ug | ORAL_TABLET | Freq: Every day | ORAL | Status: DC
  Administered 2020-02-01 – 2020-02-05 (×5): 1000 ug via ORAL
  Filled 2020-02-01 (×5): qty 1

## 2020-02-01 MED ORDER — SODIUM CHLORIDE 0.9 % IV SOLN
100.0000 mg | Freq: Every day | INTRAVENOUS | Status: AC
  Administered 2020-02-02 – 2020-02-05 (×4): 100 mg via INTRAVENOUS
  Filled 2020-02-01 (×4): qty 20

## 2020-02-01 MED ORDER — ACETAMINOPHEN 325 MG PO TABS: 650.0000 mg | ORAL_TABLET | Freq: Four times a day (QID) | ORAL | Status: DC | PRN

## 2020-02-01 MED ORDER — INSULIN ASPART 100 UNIT/ML ~~LOC~~ SOLN
0.0000 [IU] | Freq: Every day | SUBCUTANEOUS | Status: DC
  Administered 2020-02-01: 2 [IU] via SUBCUTANEOUS
  Administered 2020-02-02: 3 [IU] via SUBCUTANEOUS
  Administered 2020-02-03: 5 [IU] via SUBCUTANEOUS
  Administered 2020-02-04: 3 [IU] via SUBCUTANEOUS
  Filled 2020-02-01: qty 0.05

## 2020-02-01 MED ORDER — SODIUM CHLORIDE 0.9% FLUSH
3.0000 mL | Freq: Two times a day (BID) | INTRAVENOUS | Status: DC
  Administered 2020-02-01 – 2020-02-05 (×8): 3 mL via INTRAVENOUS

## 2020-02-01 MED ORDER — PIOGLITAZONE HCL 30 MG PO TABS
30.0000 mg | ORAL_TABLET | Freq: Every morning | ORAL | Status: DC
  Administered 2020-02-01: 30 mg via ORAL
  Filled 2020-02-01: qty 1

## 2020-02-01 MED ORDER — LOSARTAN POTASSIUM 50 MG PO TABS
100.0000 mg | ORAL_TABLET | Freq: Every day | ORAL | Status: DC
  Administered 2020-02-02 – 2020-02-05 (×4): 100 mg via ORAL
  Filled 2020-02-01 (×4): qty 2

## 2020-02-01 MED ORDER — ASCORBIC ACID 500 MG PO TABS
500.0000 mg | ORAL_TABLET | Freq: Every day | ORAL | Status: DC
  Administered 2020-02-01 – 2020-02-05 (×5): 500 mg via ORAL
  Filled 2020-02-01 (×5): qty 1

## 2020-02-01 MED ORDER — ZINC SULFATE 220 (50 ZN) MG PO CAPS
220.0000 mg | ORAL_CAPSULE | Freq: Every day | ORAL | Status: DC
  Administered 2020-02-01 – 2020-02-05 (×5): 220 mg via ORAL
  Filled 2020-02-01 (×5): qty 1

## 2020-02-01 MED ORDER — SODIUM CHLORIDE 0.9 % IV SOLN
200.0000 mg | Freq: Once | INTRAVENOUS | Status: AC
  Administered 2020-02-01: 200 mg via INTRAVENOUS
  Filled 2020-02-01: qty 200

## 2020-02-01 NOTE — Progress Notes (Addendum)
12:20pm: CSW spoke with patient's daughter again to let her know that the plan is for admission at this time due to her requiring 2L of oxygen.  9:50am: This patient has not received any COVID vaccines. Patient's daughter requested CSW reach out to [facility] in [place], however [facility] is not accepting COVID positive patients at this time.  8:30am: CSW spoke with Lindie Spruce, RN regarding patient - RN reports patient is only oriented to self at this time. Per chart, the patient's daughter is refusing to take the patient home.  CSW spoke with [name] who states she did not state she would not take her mother back but stated she is unable to care for her at this time. [name] reports she is also COVID positive and is very weak. [name] reports that there are five steps at the entrance of the home that her mother cannot walk up independently or with assistance. [name] is requesting the patient be evaluated by PT so that she can go to SNF. [name] states there is a Product/process development scientist that sits with the patient for several hours each day.  CSW spoke with Lindie Spruce, RN to request she place PT/OT consult for patient.  Edwin Dada, MSW, LCSW-A Transitions of Care  Clinical Social Worker  [facility] Emergency Departments  Medical ICU [phone]

## 2020-02-01 NOTE — ED Notes (Signed)
Obtained 1 set of blood cultures with IV start, as well as dark green, blue, and gold top. All sent to lab

## 2020-02-01 NOTE — Progress Notes (Signed)
Patient arrives to room 1532 at this time via stretcher from ED.  Patient transferred from stretcher to bed with assist of 3.  Patient oriented to callbell use and bed controls with stated understanding but will need frequent reminders.

## 2020-02-01 NOTE — ED Notes (Signed)
Lunch tray delivered.

## 2020-02-01 NOTE — ED Notes (Signed)
Report given to [facility], transfer to 5 th

## 2020-02-01 NOTE — ED Triage Notes (Signed)
Patient COVID positive since Saturday, was discharged yesterday after receiving monoclonal antibody infusion. Patient was walking up steps to daughter's house and was eased to ground by daughter, patient did not fall, did not hit ground. Daughter is refusing to take care of patient any longer, states it is now the hospital's problem, and she should not be contacted for her mother to return there upon discharge. Patient has no complaints besides generalized weakness, baseline oriented to self, history of dementia.

## 2020-02-01 NOTE — H&P (Signed)
History and Physical    [name] CBJ:[id number] DOB: 04-14-1935 DOA: 02/01/2020  Referring MD/NP/PA: Octaviano Glow, MD PCP: Biagio Borg, MD  Patient coming from: Home lives with daughter via EMS  Chief Complaint: Weakness  I have personally briefly reviewed patient's old medical records in [place]   HPI: Kathy Thomas is a 85 y.o. female with medical history significant of hypertension, hyperlipidemia, TIAs, diabetes mellitus type 2, anxiety, dementia, and recently tested positive for COVID-19 on 9/4 presents with weakness.  History is obtained mostly from the patient's daughter over the phone.  Patient symptoms started approximately 1 week ago with a mild occasional cough.  She had associated symptoms of occasional diarrhea, generalized weakness, decreased appetite, and had low-grade fever up to 100.2 F at home yesterday.  Her daughter had been giving her cough medicines and Mucinex.  She has also been checking her O2 saturations and had intermittently been dropping into the 80s but with quickly improved after coughing.  Her daughter notes that she had called EMS to bring her to the emergency department yesterday due to her oxygenation levels, but they discharged her home after she received a monoclonal antibody infusion.  After getting home the patient was unable to walk up the steps.  The patient's daughter notes that her knees buckled and she had to lay her down to the ground.  Her daughter knew that she was going to be unable to care for her at home like this and called EMS to bring her back to the hospital.  ED Course: Upon admission into the emergency department patient was noted to have temperatures up to 100.3 F, pulse 40-60s, respirations 10-28, blood pressures maintained and O2 saturations noted to be as low as 87-89% on room air with improvement on 2 L nasal cannula.  Labs significant for calcium 8.1.  Chest x-ray showed no acute abnormalities.  Patient had been  given medications and started on remdesivir.  TRH called to admit.  Review of Systems  Unable to perform ROS: Dementia  Constitutional: Positive for malaise/fatigue.  Neurological: Positive for weakness.    Past Medical History:  Diagnosis Date  . Acute sinusitis, unspecified 05/28/2008  . ANEMIA-IRON DEFICIENCY 10/26/2008  . ANXIETY 10/06/2007  . CHOLELITHIASIS 10/06/2007  . COLONIC POLYPS, HX OF 01/26/2007  . DIABETES MELLITUS, TYPE II 10/06/2007  . DIVERTICULOSIS, COLON 01/26/2007  . ECCHYMOSES, SPONTANEOUS 06/04/2010  . GERD 01/26/2007  . Heart murmur    slight heart murmur  . HERNIATED DISC 01/26/2007  . HYPERLIPIDEMIA 10/06/2007  . HYPERTENSION 01/26/2007  . LOW BACK PAIN 01/29/2007  . MENOPAUSAL DISORDER 10/26/2008  . OSTEOPOROSIS 06/04/2010  . SHOULDER PAIN, LEFT 10/06/2007  . Stroke [facility])    TIA's    Past Surgical History:  Procedure Laterality Date  . ABDOMINAL HYSTERECTOMY    . APPENDECTOMY    . Fracture left  wrist  2010   Dr. Amedeo Plenty  . Herniated disk   1995   C-spine  . Left ankle-screw placement  1980  . Left elbow-nerve impingement  1985  . TONSILLECTOMY       reports that she quit smoking about 6 years ago. Her smoking use included cigarettes. She smoked 0.00 packs per day. She has never used smokeless tobacco. She reports that she does not drink alcohol and does not use drugs.  Allergies  Allergen Reactions  . Codeine Hives  . Penicillins     REACTION: rash  . Morphine Rash    REACTION:  pt unsure of reaction    Family History  Problem Relation Age of Onset  . Heart disease Father   . Hyperlipidemia Other   . Stroke Other   . Hypertension Other   . Esophageal cancer Neg Hx   . Colon polyps Neg Hx   . Rectal cancer Neg Hx   . Stomach cancer Neg Hx     Prior to Admission medications   Medication Sig Start Date End Date Taking? Authorizing Provider  acetaminophen (TYLENOL) 500 MG tablet Take 1,000 mg by mouth every 6 (six) hours as needed for mild pain or  headache.   Yes [provider]  alendronate (FOSAMAX) 70 MG tablet Take with a full glass of water on an empty stomach. 08/16/19  Yes Biagio Borg, MD  Ascorbic Acid (VITAMIN C PO) Take 1 tablet by mouth daily.   Yes [provider]  aspirin EC 81 MG tablet Take 1 tablet (81 mg total) by mouth daily. 09/08/13  Yes Biagio Borg, MD  atorvastatin (LIPITOR) 40 MG tablet TAKE ONE TABLET BY MOUTH EVERYDAY AT BEDTIME Patient taking differently: Take 40 mg by mouth daily.  12/15/19  Yes Biagio Borg, MD  B Complex Vitamins (B COMPLEX PO) Take 1 tablet by mouth daily.   Yes [provider]  Cholecalciferol (VITAMIN D3 PO) Take 1 capsule by mouth daily.   Yes [provider]  dicyclomine (BENTYL) 20 MG tablet Take 1 tablet (20 mg total) by mouth 2 (two) times daily. 10/20/19  Yes Biagio Borg, MD  donepezil (ARICEPT) 10 MG tablet Take 1 tablet (10 mg total) by mouth daily. 08/16/19  Yes Biagio Borg, MD  FIBER PO Take 1 tablet by mouth daily.   Yes [provider]  guaiFENesin (MUCINEX) 600 MG 12 hr tablet Take 600 mg by mouth 2 (two) times daily as needed for cough.   Yes [provider]  losartan (COZAAR) 100 MG tablet Take 1 tablet (100 mg total) by mouth daily. Annual appt due in Sept must see provider for future refills 08/16/19  Yes Biagio Borg, MD  Multiple Vitamins-Minerals (ZINC PO) Take 1 tablet by mouth daily.   Yes [provider]  pioglitazone (ACTOS) 30 MG tablet TAKE ONE TABLET BY MOUTH EVERY MORNING Patient taking differently: Take 30 mg by mouth daily.  12/15/19  Yes Biagio Borg, MD  Probiotic Product (PROBIOTIC PO) Take 1 capsule by mouth daily.   Yes [provider]  Blood Glucose Monitoring Suppl (ONE TOUCH ULTRA 2) w/Device KIT Use as directed twice per day E11.9 03/07/18   Biagio Borg, MD  diphenoxylate-atropine (LOMOTIL) 2.5-0.025 MG tablet Take 1 tablet by mouth 4 (four) times daily as needed for diarrhea or  loose stools. Patient not taking: Reported on 02/01/2020 03/07/18   Biagio Borg, MD  glucose blood (ONE TOUCH ULTRA TEST) test strip Use as instructed twice per day E11.9 03/07/18   Biagio Borg, MD  Lancets MISC Use as directed twice per day E11.9 03/07/18   Biagio Borg, MD  NONFORMULARY OR COMPOUNDED ITEM Antifungal solution: Terbinafine 3%, Fluconazole 2%, Tea Tree Oil 5%, Urea 10%, Ibuprofen 2% in DMSO suspension #64m Patient not taking: Reported on 02/01/2020 02/24/19   GMarzetta Board DPM  vitamin B-12 1000 MCG tablet Take 1 tablet (1,000 mcg total) by mouth daily. Patient not taking: Reported on 02/01/2020 07/08/14   RNat Math MD    Physical Exam:  Constitutional: Elderly  female who appears to be no acute distress. Vitals:   02/01/20 1006 02/01/20 1007 02/01/20 1008 02/01/20 1015  BP:      Pulse:    (!) 48  Resp:    14  Temp:      TempSrc:      SpO2: (!) 87% (!) 89% (!) 89% (!) 89%  Weight:      Height:       Eyes: PERRL, lids and conjunctivae normal ENMT: Mucous membranes are moist. Posterior pharynx clear of any exudate or lesions.  Neck: normal, supple, no masses, no thyromegaly Respiratory:  Normal respiratory with decreased aeration.  No wheezes or rhonchi appreciated.  O2 saturation currently maintained on 2 L nasal cannula oxygen. Cardiovascular: Bradycardic, no murmurs / rubs / gallops. No extremity edema. 2+ pedal pulses. No carotid bruits.  Abdomen: no tenderness, no masses palpated. No hepatosplenomegaly. Bowel sounds positive.  Musculoskeletal: no clubbing / cyanosis. No joint deformity upper and lower extremities. Good ROM, no contractures. Normal muscle tone.  Skin: no rashes, lesions, ulcers. No induration Neurologic: CN 2-12 grossly intact. Sensation intact, DTR normal. Strength 5/5 in all 4.  Psychiatric:  Alert and oriented x self. Normal mood.     Labs on Admission: I have personally reviewed following labs and imaging studies  CBC: Recent  Labs  Lab 01/31/20 1808  WBC 2.9*  NEUTROABS 1.5*  HGB 13.2  HCT 42.1  MCV 97.2  PLT 93*   Basic Metabolic Panel: Recent Labs  Lab 01/31/20 1743  NA 138  K 4.0  CL 101  CO2 28  GLUCOSE 93  BUN 29*  CREATININE 1.08*  CALCIUM 8.3*   GFR: Estimated Creatinine Clearance: 42 mL/min (A) (by C-G formula based on SCr of 1.08 mg/dL (H)). Liver Function Tests: Recent Labs  Lab 01/31/20 1743  AST 27  ALT 22  ALKPHOS 58  BILITOT 0.8  PROT 6.6  ALBUMIN 3.1*   No results for input(s): LIPASE, AMYLASE in the last 168 hours. No results for input(s): AMMONIA in the last 168 hours. Coagulation Profile: No results for input(s): INR, PROTIME in the last 168 hours. Cardiac Enzymes: No results for input(s): CKTOTAL, CKMB, CKMBINDEX, TROPONINI in the last 168 hours. BNP (last 3 results) No results for input(s): PROBNP in the last 8760 hours. HbA1C: No results for input(s): HGBA1C in the last 72 hours. CBG: No results for input(s): GLUCAP in the last 168 hours. Lipid Profile: Recent Labs    01/31/20 1743  TRIG 63   Thyroid Function Tests: No results for input(s): TSH, T4TOTAL, FREET4, T3FREE, THYROIDAB in the last 72 hours. Anemia Panel: Recent Labs    01/31/20 1743  FERRITIN 145   Urine analysis:    Component Value Date/Time   COLORURINE YELLOW 09/01/2017 1619   APPEARANCEUR CLEAR 09/01/2017 1619   LABSPEC 1.010 09/01/2017 1619   PHURINE 6.0 09/01/2017 1619   GLUCOSEU 100 (A) 09/01/2017 1619   HGBUR NEGATIVE 09/01/2017 1619   BILIRUBINUR NEGATIVE 09/01/2017 1619   KETONESUR NEGATIVE 09/01/2017 1619   PROTEINUR NEGATIVE 07/05/2014 0932   UROBILINOGEN 0.2 09/01/2017 1619   NITRITE NEGATIVE 09/01/2017 1619   LEUKOCYTESUR NEGATIVE 09/01/2017 1619   Sepsis Labs: Recent Results (from the past 240 hour(s))  SARS Coronavirus 2 by RT PCR (hospital order, performed in [place] hospital lab) Nasopharyngeal Nasopharyngeal Swab     Status: Abnormal   Collection Time:  01/31/20  5:43 PM   Specimen: Nasopharyngeal Swab  Result Value Ref Range Status  SARS Coronavirus 2 POSITIVE (A) NEGATIVE Final    Comment: RESULT CALLED TO, READ BACK BY AND VERIFIED WITH: Trejuan Matherne,J. RN '@[email or web address]'  01/31/20 BILLINGSLEY,L (NOTE) SARS-CoV-2 target nucleic acids are DETECTED  SARS-CoV-2 RNA is generally detectable in upper respiratory specimens  during the acute phase of infection.  Positive results are indicative  of the presence of the identified virus, but do not rule out bacterial infection or co-infection with other pathogens not detected by the test.  Clinical correlation with patient history and  other diagnostic information is necessary to determine patient infection status.  The expected result is negative.  Fact Sheet for Patients:   StrictlyIdeas.no   Fact Sheet for Healthcare Providers:   BankingDealers.co.za    This test is not yet approved or cleared by the [place] FDA and  has been authorized for detection and/or diagnosis of SARS-CoV-2 by FDA under an Emergency Use Authorization (EUA).  This EUA will remain in effect (meaning thi s test can be used) for the duration of  the COVID-19 declaration under Section 564(b)(1) of the Act, 21 U.S.C. section 360-bbb-3(b)(1), unless the authorization is terminated or revoked sooner.  Performed at [facility], [place] [address] [place], [place] [address]   Blood Culture (routine x 2)     Status: None (Preliminary result)   Collection Time: 01/31/20  5:43 PM   Specimen: BLOOD  Result Value Ref Range Status   Specimen Description   Final    BLOOD RIGHT ANTECUBITAL Performed at [facility] [address], [place], [place] [address]    Special Requests   Final    BOTTLES DRAWN AEROBIC AND ANAEROBIC Blood Culture adequate volume Performed at [facility] [address] [place], [place] [address]    Culture    Final    NO GROWTH < 24 HOURS Performed at [facility] [address], [place], [place] [address]    Report Status PENDING  Incomplete  Blood Culture (routine x 2)     Status: None (Preliminary result)   Collection Time: 01/31/20  5:43 PM   Specimen: BLOOD  Result Value Ref Range Status   Specimen Description   Final    BLOOD RIGHT ANTECUBITAL Performed at [facility] [address] [place], [place] [address]    Special Requests   Final    BOTTLES DRAWN AEROBIC AND ANAEROBIC Blood Culture results may not be optimal due to an excessive volume of blood received in culture bottles Performed at [facility] [address], [place], [place] [address]    Culture   Final    NO GROWTH < 24 HOURS Performed at [facility] [address] [place], [place] [address]    Report Status PENDING  Incomplete     Radiological Exams on Admission: DG Chest Port 1 View  Result Date: 01/31/2020 CLINICAL DATA:  COVID-19 positive 5 days ago, hypoxia EXAM: PORTABLE CHEST 1 VIEW COMPARISON:  06/25/2016 FINDINGS: Single frontal view of the chest demonstrates an unremarkable cardiac silhouette. Diffuse interstitial prominence unchanged. No airspace disease, effusion, or pneumothorax. No acute bony abnormalities. IMPRESSION: 1. No acute intrathoracic process. Electronically Signed   By: Randa Ngo M.D.   On: 01/31/2020 17:27    EKG: Independently reviewed.  Sinus bradycardia at 46 bpm with QTC 507  Assessment/Plan Acute respiratory failure with hypoxia secondary to pneumonia due to COVID-19: Patient found to be hypoxic into the mid 80s for which she  was placed on 2 L of oxygen.  COVID-19 screening was positive. Chest x-ray did not show any acute abnormalities.  Labs significant for D-dimer 1.77, fibrinogen 483, CRP 3.2, lactic acid 0.6, and pro calcitonin <0.1.  CT angiogram of the chest was significant for bilateral opacities concerning for pneumonia  due to COVID-19. -Admit to a medical telemetry bed -COVID-19 order set utilized -Continuous pulse oximetry with nasal cannula oxygen to maintain O2 saturation greater than 90% -Remdesivir and day 1 of 5 -Solu-Medrol IV -Albuterol inhaler -Antitussives as needed -Tylenol as needed for fever -Continue to monitor inflammatory markers daily  Generalized weakness: Acute.  Patient noted to be generally weak likely related to COVID-19 and decreased appetite. -PT to eval and treat -Transitions of care consult for need of placement  Sinus bradycardia: Patient heart rates appear to be in the 40-50s, but vital signs otherwise stable..  Donepezil has been known to cause bradycardia. -Hold donepezil  Prolonged QT interval: QTc 507 on admission. -Hold QT prolonging medications -Correct electrolyte abnormalities -Recheck EKG in a.m.  Diabetes mellitus type 2: Patient well-controlled last hemoglobin A1c noted to be 6.2 on 11/03/2019.  Home medications include Actos 30 mg daily. -Hypoglycemic protocols  -Hold Actos -CBGs before every meal and at bedtime with sensitive SSI -Adjust insulin regimen as needed with patient being on steroids  Essential hypertension: Blood pressures currently maintained.  Home blood pressure medications include losartan 100 mg daily. -Continue losartan  Dementia: Patient noted to have dementia and had been on donepezil for treatment. -Hold donepezil due to bradycardia  Thrombocytopenia: Acute.  Platelet count noted to be as low as 93 9/8, but repeat check 102 today.  No prior history of this in the past. -Continue to monitor  Protein calorie malnutrition: On labs from 9/8 albumin noted to be 3.1. -Check prealbumin in a.m. -Glucerna shakes in between meals  DVT prophylaxis: Lovenox, but continue to monitor platelet counts Code Status: Full Family Communication: Discussed plan of care with the daughter over the phone. Disposition Plan: To be determined Consults  called: None  Admission status: inpatient status requiring more than 2 midnight stay due to hypoxemia and weakness in elderly patient with COVID-19.  Norval Morton MD [facility] Pager [phone]   If 7PM-7AM, please contact night-coverage [email or web address] Password [email or web address]  02/01/2020, 10:40 AM

## 2020-02-01 NOTE — Progress Notes (Signed)
PHARMACY - PHYSICIAN COMMUNICATION CRITICAL VALUE ALERT - BLOOD CULTURE IDENTIFICATION (BCID)  Kathy Thomas is an 85 y.o. female who presented to [facility] on 02/01/2020 with a chief complaint of Covid pna  Assessment: BCID resulted with Staph species (not identified) in only 1 of 4 bottles (anaerobic)  Name of physician (or Provider) Contacted: Madelyn Flavors MD  Current antibiotics: none  Changes to prescribed antibiotics recommended: none   Results for orders placed or performed during the hospital encounter of 01/31/20  Blood Culture ID Panel (Reflexed) (Collected: 01/31/2020  5:43 PM)  Result Value Ref Range   Enterococcus faecalis NOT DETECTED NOT DETECTED   Enterococcus Faecium NOT DETECTED NOT DETECTED   Listeria monocytogenes NOT DETECTED NOT DETECTED   Staphylococcus species DETECTED (A) NOT DETECTED   Staphylococcus aureus (BCID) NOT DETECTED NOT DETECTED   Staphylococcus epidermidis NOT DETECTED NOT DETECTED   Staphylococcus lugdunensis NOT DETECTED NOT DETECTED   Streptococcus species NOT DETECTED NOT DETECTED   Streptococcus agalactiae NOT DETECTED NOT DETECTED   Streptococcus pneumoniae NOT DETECTED NOT DETECTED   Streptococcus pyogenes NOT DETECTED NOT DETECTED   A.calcoaceticus-baumannii NOT DETECTED NOT DETECTED   Bacteroides fragilis NOT DETECTED NOT DETECTED   Enterobacterales NOT DETECTED NOT DETECTED   Enterobacter cloacae complex NOT DETECTED NOT DETECTED   Escherichia coli NOT DETECTED NOT DETECTED   Klebsiella aerogenes NOT DETECTED NOT DETECTED   Klebsiella oxytoca NOT DETECTED NOT DETECTED   Klebsiella pneumoniae NOT DETECTED NOT DETECTED   Proteus species NOT DETECTED NOT DETECTED   Salmonella species NOT DETECTED NOT DETECTED   Serratia marcescens NOT DETECTED NOT DETECTED   Haemophilus influenzae NOT DETECTED NOT DETECTED   Neisseria meningitidis NOT DETECTED NOT DETECTED   Pseudomonas aeruginosa NOT DETECTED NOT DETECTED   Stenotrophomonas  maltophilia NOT DETECTED NOT DETECTED   Candida albicans NOT DETECTED NOT DETECTED   Candida auris NOT DETECTED NOT DETECTED   Candida glabrata NOT DETECTED NOT DETECTED   Candida krusei NOT DETECTED NOT DETECTED   Candida parapsilosis NOT DETECTED NOT DETECTED   Candida tropicalis NOT DETECTED NOT DETECTED   Cryptococcus neoformans/gattii NOT DETECTED NOT DETECTED    Otho Bellows PharmD 02/01/2020  4:10 PM

## 2020-02-01 NOTE — Progress Notes (Signed)
Flutter Valve not giving due to on back order.

## 2020-02-01 NOTE — ED Notes (Signed)
Pt given breakfast.

## 2020-02-01 NOTE — ED Provider Notes (Signed)
Emergency Department Provider Note   I have reviewed the triage vital signs and the nursing notes.   HISTORY  Chief Complaint Covid Positive   HPI Kathy Thomas is a 85 y.o. female with PMH including dementia and known COVID 19 infection returns to the emergency department shortly after discharge.  She lives at home with her daughter and came to the emergency department late yesterday with COVID-19-like symptoms.  She was not found to be hypoxic at that time and lab work along with chest x-ray showed no indication for admission.  She was given monoclonal antibodies and ultimately discharged home with her daughter.  Per EMS report, the patient became weak with walking up the steps and was eased to the ground by her daughter.  She called EMS and the patient was transported back to the emergency department.  There was no fall to the ground or head injury.  Patient tells me that she is feeling well but level 5 caveat applies with history of dementia.    Past Medical History:  Diagnosis Date  . Acute sinusitis, unspecified 05/28/2008  . ANEMIA-IRON DEFICIENCY 10/26/2008  . ANXIETY 10/06/2007  . CHOLELITHIASIS 10/06/2007  . COLONIC POLYPS, HX OF 01/26/2007  . DIABETES MELLITUS, TYPE II 10/06/2007  . DIVERTICULOSIS, COLON 01/26/2007  . ECCHYMOSES, SPONTANEOUS 06/04/2010  . GERD 01/26/2007  . Heart murmur    slight heart murmur  . HERNIATED DISC 01/26/2007  . HYPERLIPIDEMIA 10/06/2007  . HYPERTENSION 01/26/2007  . LOW BACK PAIN 01/29/2007  . MENOPAUSAL DISORDER 10/26/2008  . OSTEOPOROSIS 06/04/2010  . SHOULDER PAIN, LEFT 10/06/2007  . Stroke [facility])    TIA's    Patient Active Problem List   Diagnosis Date Noted  . Encounter for examination for admission to assisted living facility 10/20/2019  . Pain due to onychomycosis of toenails of both feet 12/20/2018  . Diabetes mellitus without complication (HCC) 12/20/2018  . Diarrhea 03/07/2018  . Acute right ankle pain 04/08/2017  . Weight loss 06/25/2016    . Iron deficiency 12/30/2015  . General weakness 07/08/2014  . Systolic CHF, chronic (HCC) 07/07/2014  . Vitamin B12 deficiency 07/06/2014  . Left fibular fracture 07/05/2014  . Closed fibular fracture 07/05/2014  . Left ankle pain 07/04/2014  . Left knee pain 07/04/2014  . Gait disorder 01/10/2014  . Ruptured sebaceous cyst 10/16/2013  . Dementia (HCC) 09/08/2013  . Left lumbar radiculopathy 10/28/2011  . Grief reaction 10/28/2011  . Insect bite, infected 10/28/2011  . Preventative health care 12/06/2010  . OSTEOPOROSIS 06/04/2010  . ECCHYMOSES, SPONTANEOUS 06/04/2010  . ANEMIA-IRON DEFICIENCY 10/26/2008  . MENOPAUSAL DISORDER 10/26/2008  . DM type 2 (diabetes mellitus, type 2) (HCC) 10/06/2007  . Hyperlipidemia 10/06/2007  . ANXIETY 10/06/2007  . CHOLELITHIASIS 10/06/2007  . SHOULDER PAIN, LEFT 10/06/2007  . LOW BACK PAIN 01/29/2007  . Essential hypertension 01/26/2007  . GERD 01/26/2007  . DIVERTICULOSIS, COLON 01/26/2007  . HERNIATED DISC 01/26/2007  . History of colonic polyps 01/26/2007    Past Surgical History:  Procedure Laterality Date  . ABDOMINAL HYSTERECTOMY    . APPENDECTOMY    . Fracture left  wrist  2010   Dr. Amanda Pea  . Herniated disk   1995   C-spine  . Left ankle-screw placement  1980  . Left elbow-nerve impingement  1985  . TONSILLECTOMY      Allergies Codeine, Penicillins, and Morphine  Family History  Problem Relation Age of Onset  . Heart disease Father   . Hyperlipidemia Other   .  Stroke Other   . Hypertension Other   . Esophageal cancer Neg Hx   . Colon polyps Neg Hx   . Rectal cancer Neg Hx   . Stomach cancer Neg Hx     Social History Social History   Tobacco Use  . Smoking status: Former Smoker    Packs/day: 0.00    Types: Cigarettes    Quit date: 06/29/2013    Years since quitting: 6.5  . Smokeless tobacco: Never Used  Vaping Use  . Vaping Use: Never used  Substance Use Topics  . Alcohol use: No  . Drug use: No     Review of Systems  Constitutional: No fever/chills Eyes: No visual changes. ENT: No sore throat. Cardiovascular: Denies chest pain. Respiratory: Denies shortness of breath. Gastrointestinal: No abdominal pain.  No nausea, no vomiting.  No diarrhea.  No constipation. Genitourinary: Negative for dysuria. Musculoskeletal: Negative for back pain. Skin: Negative for rash. Neurological: Negative for headaches, focal weakness or numbness.  10-point ROS otherwise negative.  ____________________________________________   PHYSICAL EXAM:  VITAL SIGNS: ED Triage Vitals  Enc Vitals Group     BP 02/01/20 0232 (!) 166/59     Pulse Rate 02/01/20 0232 62     Resp 02/01/20 0232 20     Temp 02/01/20 0232 100.3 F (37.9 C)     Temp Source 02/01/20 0232 Oral     SpO2 02/01/20 0232 94 %     Weight 02/01/20 0234 207 lb 0.2 oz (93.9 kg)     Height 02/01/20 0234 5\' 1"  (1.549 m)   Constitutional: Alert but confused. Well appearing and in no acute distress. Eyes: Conjunctivae are normal.  Head: Atraumatic. Nose: No congestion/rhinnorhea. Mouth/Throat: Mucous membranes are moist.  Neck: No stridor.  Cardiovascular: Normal rate, regular rhythm. Good peripheral circulation. Grossly normal heart sounds.   Respiratory: Normal respiratory effort.  No retractions. Lungs CTAB. Gastrointestinal: Soft and nontender. No distention.  Musculoskeletal: No lower extremity tenderness nor edema. No gross deformities of extremities. Neurologic:  Normal speech and language. No gross focal neurologic deficits are appreciated.  Skin:  Skin is warm, dry and intact. No rash noted.  ____________________________________________  RADIOLOGY  DG Chest Port 1 View  Result Date: 01/31/2020 CLINICAL DATA:  COVID-19 positive 5 days ago, hypoxia EXAM: PORTABLE CHEST 1 VIEW COMPARISON:  06/25/2016 FINDINGS: Single frontal view of the chest demonstrates an unremarkable cardiac silhouette. Diffuse interstitial prominence  unchanged. No airspace disease, effusion, or pneumothorax. No acute bony abnormalities. IMPRESSION: 1. No acute intrathoracic process. Electronically Signed   By: 08/23/2016 M.D.   On: 01/31/2020 17:27    ____________________________________________   PROCEDURES  Procedure(s) performed:   Procedures  None  ____________________________________________   INITIAL IMPRESSION / ASSESSMENT AND PLAN / ED COURSE  Pertinent labs & imaging results that were available during my care of the patient were reviewed by me and considered in my medical decision making (see chart for details).   Patient presents to the emergency department after becoming weak and being lowered to the ground upon returning home from the ED earlier this evening.  She received monoclonal antibody therapy prior to discharge.  She is not hypoxemic here but does have borderline fever.  Will treat with Tylenol.  Reviewed lab work and imaging from her ED visit just several hours earlier.  We will continue to have the patient on monitor.  She is in no acute distress.  No indication for repeat labs at this time.  Reviewed the triage  note after discussion with EMS.  Patient's daughter is now willing to care for her any longer at home.  I do not see an immediate indication for admission but will continue to monitor for any changes in symptoms that would necessitate admission to the hospital. Attempted to call family with no answer.   Case mgmt consulted. Continues to have minimal symptoms and no hypoxemia.  ____________________________________________  FINAL CLINICAL IMPRESSION(S) / ED DIAGNOSES  Final diagnoses:  COVID-19    MEDICATIONS GIVEN DURING THIS VISIT:  Medications  acetaminophen (TYLENOL) tablet 1,000 mg (1,000 mg Oral Given 02/01/20 0247)    Note:  This document was prepared using Dragon voice recognition software and may include unintentional dictation errors.  Alona Bene, MD, [facility]      Alencia Gordon, Arlyss Repress, MD 02/01/20 [phone]

## 2020-02-01 NOTE — ED Provider Notes (Addendum)
Technician had reported possible hypoxia and started patient on 4L [place].  I went into the room to assess the patient.  She is 92% on room air currently. Her HR is 55 bpm.  Her BP is 110/60.  I suspect the hypoxia may either have been erroneous reading on the pulse ox, or else related to some sleep obstruction.  I've asked nursing to notify me again if the patient becomes hypoxia.    She is pending SW evaluation for placement.  She is COVID (+).   Update - 10:20 AM - 87-89% on room air, now requiring 2L [place].  This appears to be persistent.  Will page hospitalist for admission.   Terald Sleeper, MD 02/01/20 7703    Terald Sleeper, MD 02/01/20 1022

## 2020-02-02 LAB — COMPREHENSIVE METABOLIC PANEL
ALT: 19 U/L (ref 0–44)
AST: 25 U/L (ref 15–41)
Albumin: 2.8 g/dL — ABNORMAL LOW (ref 3.5–5.0)
Alkaline Phosphatase: 50 U/L (ref 38–126)
Anion gap: 10 (ref 5–15)
BUN: 26 mg/dL — ABNORMAL HIGH (ref 8–23)
CO2: 25 mmol/L (ref 22–32)
Calcium: 8.5 mg/dL — ABNORMAL LOW (ref 8.9–10.3)
Chloride: 104 mmol/L (ref 98–111)
Creatinine, Ser: 0.8 mg/dL (ref 0.44–1.00)
GFR calc Af Amer: >60 mL/min (ref >60)
GFR calc non Af Amer: >60 mL/min (ref >60)
Glucose, Bld: 216 mg/dL — ABNORMAL HIGH (ref 70–99)
Potassium: 4.1 mmol/L (ref 3.5–5.1)
Sodium: 139 mmol/L (ref 135–145)
Total Bilirubin: 0.4 mg/dL (ref 0.3–1.2)
Total Protein: 6.1 g/dL — ABNORMAL LOW (ref 6.5–8.1)

## 2020-02-02 LAB — CBC WITH DIFFERENTIAL/PLATELET
Abs Immature Granulocytes: 0.02 10*3/uL (ref 0.00–0.07)
Basophils Absolute: 0 10*3/uL (ref 0.0–0.1)
Basophils Relative: 0 %
Eosinophils Absolute: 0 10*3/uL (ref 0.0–0.5)
Eosinophils Relative: 0 %
HCT: 43.5 % (ref 36.0–46.0)
Hemoglobin: 13.4 g/dL (ref 12.0–15.0)
Immature Granulocytes: 1 %
Lymphocytes Relative: 12 %
Lymphs Abs: 0.5 10*3/uL — ABNORMAL LOW (ref 0.7–4.0)
MCH: 30 pg (ref 26.0–34.0)
MCHC: 30.8 g/dL (ref 30.0–36.0)
MCV: 97.5 fL (ref 80.0–100.0)
Monocytes Absolute: 0.2 10*3/uL (ref 0.1–1.0)
Monocytes Relative: 4 %
Neutro Abs: 3.4 10*3/uL (ref 1.7–7.7)
Neutrophils Relative %: 83 %
Platelets: 110 10*3/uL — ABNORMAL LOW (ref 150–400)
RBC: 4.46 MIL/uL (ref 3.87–5.11)
RDW: 12.6 % (ref 11.5–15.5)
WBC: 4.1 10*3/uL (ref 4.0–10.5)
nRBC: 0 % (ref 0.0–0.2)

## 2020-02-02 LAB — GLUCOSE, CAPILLARY
Glucose-Capillary: 146 mg/dL — ABNORMAL HIGH (ref 70–99)
Glucose-Capillary: 238 mg/dL — ABNORMAL HIGH (ref 70–99)
Glucose-Capillary: 266 mg/dL — ABNORMAL HIGH (ref 70–99)
Glucose-Capillary: 264 mg/dL — ABNORMAL HIGH (ref 70–99)

## 2020-02-02 LAB — MAGNESIUM: Magnesium: 2 mg/dL (ref 1.7–2.4)

## 2020-02-02 LAB — PHOSPHORUS: Phosphorus: 2.7 mg/dL (ref 2.5–4.6)

## 2020-02-02 LAB — FERRITIN: Ferritin: 169 ng/mL (ref 11–307)

## 2020-02-02 LAB — C-REACTIVE PROTEIN: CRP: 9.6 mg/dL — ABNORMAL HIGH (ref <1.0)

## 2020-02-02 LAB — PREALBUMIN: Prealbumin: 8.2 mg/dL — ABNORMAL LOW (ref 18–38)

## 2020-02-02 LAB — D-DIMER, QUANTITATIVE: D-Dimer, Quant: 2.29 ug/mL-FEU — ABNORMAL HIGH (ref 0.00–0.50)

## 2020-02-02 MED ORDER — FAMOTIDINE 20 MG PO TABS
20.0000 mg | ORAL_TABLET | Freq: Every day | ORAL | Status: DC
  Administered 2020-02-02 – 2020-02-05 (×4): 20 mg via ORAL
  Filled 2020-02-02 (×5): qty 1

## 2020-02-02 NOTE — Evaluation (Signed)
Physical Therapy Evaluation Patient Details Name: Kathy Thomas MRN: [id number] DOB: Jul 14, 1934 Today's Date: 02/02/2020   History of Present Illness  85 y.o. female with past stroke, dementia, diabetes, HTN, LBP, and osteoporosis presents with c/o COVID + and is currently being treated for covid PNA.  Clinical Impression  Pt admitted with above diagnosis. Pt able to come to sitting EOB without physical assist, but is requiring min-mod A to stand and take limited steps in room. Pt on 2L O2 with SpO2 89-93%, maintains O2 >90% with cues for pursed lip breathing and minimal SOB with mobility. Pt is oriented to self and year, unable to explain PLOF or home arrangement. Per chart review, pt living at home with daughter, 5 STE and personal aide; due to unsure of previous functional status or current support system, recommending short term SNF to improve deficits noted. Pt currently with functional limitations due to the deficits listed below (see PT Problem List). Pt will benefit from skilled PT to increase their independence and safety with mobility to allow discharge to the venue listed below.       Follow Up Recommendations SNF    Equipment Recommendations  Other (comment) (TBD pending home DME)    Recommendations for Other Services       Precautions / Restrictions Precautions Precautions: Fall Restrictions Weight Bearing Restrictions: No      Mobility  Bed Mobility Overal bed mobility: Needs Assistance Bed Mobility: Supine to Sit  Supine to sit: Supervision;HOB elevated  General bed mobility comments: slow movement to come to sitting EOB, use of bedrail and elevated HOB to upright trunk and scoot to EOB  Transfers Overall transfer level: Needs assistance Equipment used: Rolling walker (2 wheeled) Transfers: Sit to/from Stand Sit to Stand: Min assist;From elevated surface  General transfer comment: min A to power up from seated EOB, cues for hand placement on bed to improve  safety  Ambulation/Gait Ambulation/Gait assistance: Mod assist   Assistive device: Rolling walker (2 wheeled) Gait Pattern/deviations: Step-through pattern;Decreased stride length;Trunk flexed     General Gait Details: limited to a few slow steps at bedside, able to clear bil feet from floor, trunk flexed over RW to steady with minimal improvements when cued  Stairs            Wheelchair Mobility    Modified Rankin (Stroke Patients Only)       Balance Overall balance assessment: Needs assistance Sitting-balance support: Feet supported;Single extremity supported Sitting balance-Leahy Scale: Fair Sitting balance - Comments: seated EOB   Standing balance support: During functional activity;Bilateral upper extremity supported Standing balance-Leahy Scale: Poor Standing balance comment: reliant on UE for support                Pertinent Vitals/Pain Pain Assessment: No/denies pain    Home Living Family/patient expects to be discharged to:: Unsure Living Arrangements: Children  Additional Comments: Per chart review, pt living with daughter, 5 steps to enter, sitter several hours per day. Pt poor historian and unable to verbalize living arrangements.    Prior Function Level of Independence: Independent with assistive device(s)  Comments: Pt poor historian, no family present. Pt reports she used RW or no AD to get around in the home.     Hand Dominance        Extremity/Trunk Assessment   Upper Extremity Assessment Upper Extremity Assessment: Overall WFL for tasks assessed    Lower Extremity Assessment Lower Extremity Assessment: Generalized weakness (AROM WNL, grossly 3+/5 BLE, denies numbness/tingling)  Cervical / Trunk Assessment Cervical / Trunk Assessment: Normal  Communication   Communication: No difficulties  Cognition Arousal/Alertness: Awake/alert Behavior During Therapy: WFL for tasks assessed/performed Overall Cognitive Status: No  family/caregiver present to determine baseline cognitive functioning  General Comments: Pt alert, oriented to self (name, birthday, age) and year only. Pt states she is in hospital, but unable to state name or city/state and is unaware of situation.      General Comments General comments (skin integrity, edema, etc.): on 2L O2 with SpO2 89-93% with mobility, minimal SOB noted, cued for pursed lip breathing to maintain oxygenation >90%.    Exercises     Assessment/Plan    PT Assessment Patient needs continued PT services  PT Problem List Decreased strength;Decreased activity tolerance;Decreased balance;Decreased knowledge of use of DME;Decreased safety awareness;Cardiopulmonary status limiting activity;Obesity       PT Treatment Interventions DME instruction;Gait training;Functional mobility training;Therapeutic activities;Therapeutic exercise;Balance training;Neuromuscular re-education;Patient/family education;Manual techniques    PT Goals (Current goals can be found in the Care Plan section)  Acute Rehab PT Goals Patient Stated Goal: none stated PT Goal Formulation: With patient Time For Goal Achievement: 02/16/20 Potential to Achieve Goals: Good    Frequency Min 2X/week   Barriers to discharge        Co-evaluation               AM-PAC PT "6 Clicks" Mobility  Outcome Measure Help needed turning from your back to your side while in a flat bed without using bedrails?: A Little Help needed moving from lying on your back to sitting on the side of a flat bed without using bedrails?: A Little Help needed moving to and from a bed to a chair (including a wheelchair)?: A Little Help needed standing up from a chair using your arms (e.g., wheelchair or bedside chair)?: A Little Help needed to walk in hospital room?: A Lot Help needed climbing 3-5 steps with a railing? : Total 6 Click Score: 15    End of Session Equipment Utilized During Treatment: Gait belt Activity  Tolerance: Patient limited by fatigue Patient left: in chair;with call bell/phone within reach;with chair alarm set Nurse Communication: Mobility status PT Visit Diagnosis: Unsteadiness on feet (R26.81);Other abnormalities of gait and mobility (R26.89);Muscle weakness (generalized) (M62.81)    Time: 4944-9675 PT Time Calculation (min) (ACUTE ONLY): 20 min   Charges:   PT Evaluation $PT Eval Moderate Complexity: 1 Mod           Tori Danil Wedge PT, DPT 02/02/20, 1:28 PM

## 2020-02-02 NOTE — TOC Initial Note (Signed)
Transition of Care [facility]) - Initial/Assessment Note    Patient Details  Name: Kathy Thomas MRN: [id number] Date of Birth: 1935-03-31  [facility]) CM/SW Contact:    Ida Rogue, LCSW Phone Number: 02/02/2020, 3:11 PM  Clinical Narrative:    Patient seen in follow up to PT recommendation of SNF.  Spoke with daughter [name] who confirms family's desire for short term rehab.  Explained process to her and the four facilities locally that are taking patients.  In this moment her preferences are [facility] or [facility], but she was willing for me to send out information to all four.  PASSR and bed search initiated. TOC will continue to follow during the course of hospitalization.                Expected Discharge Plan: Skilled Nursing Facility Barriers to Discharge: SNF Pending bed offer   Patient Goals and CMS Choice     Choice offered to / list presented to : Adult Children  Expected Discharge Plan and Services Expected Discharge Plan: Skilled Nursing Facility   Discharge Planning Services: CM Consult   Living arrangements for the past 2 months: Single Family Home                                      Prior Living Arrangements/Services Living arrangements for the past 2 months: Single Family Home Lives with:: Adult Children Patient language and need for interpreter reviewed:: Yes        Need for Family Participation in Patient Care: Yes (Comment) Care giver support system in place?: Yes (comment) Current home services: DME Criminal Activity/Legal Involvement Pertinent to Current Situation/Hospitalization: No - Comment as needed  Activities of Daily Living Home Assistive Devices/Equipment: Eyeglasses, Dan Humphreys (specify type) (front wheeled walker) ADL Screening (condition at time of admission) Patient's cognitive ability adequate to safely complete daily activities?: No Is the patient deaf or have difficulty hearing?: Yes Does the patient have  difficulty seeing, even when wearing glasses/contacts?: No Does the patient have difficulty concentrating, remembering, or making decisions?: Yes Patient able to express need for assistance with ADLs?: Yes Does the patient have difficulty dressing or bathing?: Yes Independently performs ADLs?: No Communication: Independent Dressing (OT): Needs assistance Is this a change from baseline?: Change from baseline, expected to last >3 days Grooming: Needs assistance Is this a change from baseline?: Change from baseline, expected to last >3 days Feeding: Needs assistance Is this a change from baseline?: Change from baseline, expected to last >3 days Bathing: Needs assistance Is this a change from baseline?: Change from baseline, expected to last >3 days Toileting: Needs assistance Is this a change from baseline?: Change from baseline, expected to last >3days In/Out Bed: Needs assistance Is this a change from baseline?: Change from baseline, expected to last >3 days Walks in Home: Needs assistance Is this a change from baseline?: Change from baseline, expected to last >3 days Does the patient have difficulty walking or climbing stairs?: Yes (secondary to weakness) Weakness of Legs: Both Weakness of Arms/Hands: None  Permission Sought/Granted Permission sought to share information with : Family Supports Permission granted to share information with : No  Share Information with NAME: [name] (Daughter) [phone]           Emotional Assessment Appearance:: Appears older than stated age Attitude/Demeanor/Rapport: Engaged Affect (typically observed): Appropriate Orientation: : Oriented to Self Alcohol / Substance Use:  Not Applicable Psych Involvement: No (comment)  Admission diagnosis:  Acute hypoxemic respiratory failure due to COVID-19 (HCC) [U07.1, J96.01] COVID-19 [U07.1] Patient Active Problem List   Diagnosis Date Noted  . Pneumonia due to COVID-19 virus 02/01/2020  .  Acute respiratory failure with hypoxia (HCC) 02/01/2020  . Protein calorie malnutrition (HCC) 02/01/2020  . Thrombocytopenia (HCC) 02/01/2020  . Encounter for examination for admission to assisted living facility 10/20/2019  . Pain due to onychomycosis of toenails of both feet 12/20/2018  . Diabetes mellitus without complication (HCC) 12/20/2018  . Diarrhea 03/07/2018  . Acute right ankle pain 04/08/2017  . Weight loss 06/25/2016  . Iron deficiency 12/30/2015  . Generalized weakness 07/08/2014  . Systolic CHF, chronic (HCC) 07/07/2014  . Vitamin B12 deficiency 07/06/2014  . Left fibular fracture 07/05/2014  . Closed fibular fracture 07/05/2014  . Left ankle pain 07/04/2014  . Left knee pain 07/04/2014  . Gait disorder 01/10/2014  . Ruptured sebaceous cyst 10/16/2013  . Dementia without behavioral disturbance (HCC) 09/08/2013  . Left lumbar radiculopathy 10/28/2011  . Grief reaction 10/28/2011  . Insect bite, infected 10/28/2011  . Preventative health care 12/06/2010  . OSTEOPOROSIS 06/04/2010  . ECCHYMOSES, SPONTANEOUS 06/04/2010  . ANEMIA-IRON DEFICIENCY 10/26/2008  . MENOPAUSAL DISORDER 10/26/2008  . DM type 2 (diabetes mellitus, type 2) (HCC) 10/06/2007  . Hyperlipidemia 10/06/2007  . ANXIETY 10/06/2007  . CHOLELITHIASIS 10/06/2007  . SHOULDER PAIN, LEFT 10/06/2007  . LOW BACK PAIN 01/29/2007  . Essential hypertension 01/26/2007  . GERD 01/26/2007  . DIVERTICULOSIS, COLON 01/26/2007  . HERNIATED DISC 01/26/2007  . History of colonic polyps 01/26/2007   PCP:  [name], MD Pharmacy:   [facility] - [place], [place] - [address] [place] [address] Phone: [phone] Fax: [phone]     Social Determinants of Health (SDOH) Interventions    Readmission Risk Interventions No flowsheet data found.

## 2020-02-02 NOTE — Care Management Important Message (Signed)
Important Message  Patient Details IM Letter given to the Patient Name: Kathy Thomas MRN: [id number] Date of Birth: [birth year]   Medicare Important Message Given:  Yes     Caren Macadam 02/02/2020, 11:27 AM

## 2020-02-02 NOTE — Progress Notes (Signed)
PROGRESS NOTE  Kathy BailBetty R Thomas ZOX:[id number] DOB: 1934-09-05 DOA: 02/01/2020  PCP: Corwin LevinsJohn, James W, MD  Brief History/Interval Summary: 85 y.o. female with medical history significant of hypertension, hyperlipidemia, TIAs, diabetes mellitus type 2, anxiety, dementia, and recently tested positive for COVID-19 on 9/4 presents with weakness.  History is obtained mostly from the patient's daughter over the phone.  Patient symptoms started approximately 1 week ago with a mild occasional cough.  She had associated symptoms of occasional diarrhea, generalized weakness, decreased appetite, and had low-grade fever up to 100.2 F at home yesterday.  Her daughter had been giving her cough medicines and Mucinex.  She has also been checking her O2 saturations and had intermittently been dropping into the 80s but with quickly improved after coughing.  Her daughter notes that she had called EMS to bring her to the emergency department yesterday due to her oxygenation levels, but they discharged her home after she received a monoclonal antibody infusion.  After getting home the patient was unable to walk up the steps.  The patient's daughter notes that her knees buckled and she had to lay her down to the ground.  Her daughter knew that she was going to be unable to care for her at home like this and called EMS to bring her back to the hospital.  ED Course: Upon admission into the emergency department patient was noted to have temperatures up to 100.3 F, pulse 40-60s, respirations 10-28, blood pressures maintained and O2 saturations noted to be as low as 87-89% on room air with improvement on 2 L nasal cannula.  Labs significant for calcium 8.1.  Chest x-ray showed no acute abnormalities.  Patient had been given medications and started on remdesivir.  TRH called to admit.   Reason for Visit: Pneumonia due to COVID-19.  Acute respiratory failure with hypoxia.  Consultants: None  Procedures:  None  Antibiotics: Anti-infectives (From admission, onward)   Start     Dose/Rate Route Frequency Ordered Stop   02/02/20 1000  remdesivir 100 mg in sodium chloride 0.9 % 100 mL IVPB       "Followed by" Linked Group Details   100 mg 200 mL/hr over 30 Minutes Intravenous Daily 02/01/20 1051 02/06/20 0959   02/01/20 1200  remdesivir 200 mg in sodium chloride 0.9% 250 mL IVPB       "Followed by" Linked Group Details   200 mg 580 mL/hr over 30 Minutes Intravenous Once 02/01/20 1051 02/01/20 1316      Subjective/Interval History: Patient pleasantly confused.  Does not appear to be in any distress.  Not noted to be tachypneic.  Unable to answer questions at this time due to her dementia.    Assessment/Plan:  Acute Hypoxic Resp. Failure/Pneumonia due to COVID-19   Recent Labs  Lab 01/31/20 1743 02/02/20 0424  DDIMER 1.77* 2.29*  FERRITIN 145 169  CRP 3.2* 9.6*  ALT 22 19  PROCALCITON <0.10  --     Objective findings: Fever: Low-grade fever noted Oxygen requirements: Patient currently on 2 L of oxygen by nasal cannula saturating in the early 90s  COVID 19 Therapeutics: Antibacterials: None.  Procalcitonin was less than 0.1 Remdesivir: Day 2 Steroids: On Solu-Medrol Diuretics: None Actemra/Baricitinib: Not initiated yet PUD Prophylaxis: Will initiate Pepcid DVT Prophylaxis:  Lovenox  From a respiratory standpoint patient seems to be stable.  Requiring 1 to 2 L of oxygen currently.  Her D-dimer noted to be higher today at 2.29.  Continue to trend.  CRP also  noted to be treated at 9.6.  Continue steroids and Remdesivir.  Incentive spirometry mobilization.  PT and OT evaluation.  The treatment plan and use of medications and known side effects were discussed with patient/family. Some of the medications used are based on case reports/anecdotal data.  All other medications being used in the management of COVID-19 based on limited study data.  Complete risks and long-term side  effects are unknown, however in the best clinical judgment they seem to be of some benefit.  Patient/family wanted to proceed with treatment options provided.  Bacteremia 1 set of blood cultures growing gram-positive cocci in clusters.  Patient is afebrile.  WBC is normal.  Likely contaminant.  Monitor for now.  Wait for final identification.  Sinus bradycardia Thought to be due to with donepezil.  On hold currently.  Patient seems to be asymptomatic.  Prolonged QT interval Avoid QT prolonging medication.  Monitor electrolytes.  Diabetes mellitus type 2 HbA1c 6.2 in June.  Holding Actos.  SSI.  Essential hypertension Continue to monitor blood pressures.  Continue losartan.  History of dementia Holding donepezil due to bradycardia.  Thrombocytopenia Platelet count is low but stable.  Continue to monitor.  Obesity Estimated body mass index is 39.11 kg/m as calculated from the following:   Height as of this encounter: 5\' 1"  (1.549 m).   Weight as of this encounter: 93.9 kg.  DVT Prophylaxis: Lovenox Code Status: Full code Family Communication: We will discuss with family later today Disposition Plan: Hopefully return home when improved  Status is: Inpatient  Remains inpatient appropriate because:IV treatments appropriate due to intensity of illness or inability to take PO and Inpatient level of care appropriate due to severity of illness   Dispo: The patient is from: Home              Anticipated d/c is to: Home              Anticipated d/c date is: 2 days              Patient currently is not medically stable to d/c.     Medications:  Scheduled: . albuterol  2 puff Inhalation Q6H  . vitamin C  500 mg Oral Daily  . aspirin EC  81 mg Oral Daily  . atorvastatin  40 mg Oral Daily  . calcium-vitamin D  1 tablet Oral Daily  . enoxaparin (LOVENOX) injection  40 mg Subcutaneous Q24H  . feeding supplement (GLUCERNA SHAKE)  237 mL Oral TID BM  . insulin aspart  0-5 Units  Subcutaneous QHS  . insulin aspart  0-9 Units Subcutaneous TID WC  . losartan  100 mg Oral Daily  . methylPREDNISolone (SOLU-MEDROL) injection  0.5 mg/kg Intravenous Q12H   Followed by  . [START ON 02/04/2020] predniSONE  50 mg Oral Daily  . sodium chloride flush  3 mL Intravenous Q12H  . cyanocobalamin  1,000 mcg Oral Daily  . zinc sulfate  220 mg Oral Daily   Continuous: . remdesivir 100 mg in NS 100 mL 100 mg (02/02/20 0833)   04/03/20, guaiFENesin-dextromethorphan   Objective:  Vital Signs  Vitals:   02/01/20 2051 02/02/20 0205 02/02/20 0458 02/02/20 1337  BP: (!) 129/51 (!) 147/55 (!) 151/76 (!) 130/58  Pulse: (!) 52 (!) 51 (!) 54 (!) 58  Resp: 20 20 (!) 21 16  Temp: 98 F (36.7 C) 97.7 F (36.5 C) 97.7 F (36.5 C) 98.1 F (36.7 C)  TempSrc:  SpO2: 93% 94% 95% 96%  Weight:      Height:        Intake/Output Summary (Last 24 hours) at 02/02/2020 1341 Last data filed at 02/02/2020 1212 Gross per 24 hour  Intake 379 ml  Output 400 ml  Net -21 ml   Filed Weights   02/01/20 0234  Weight: 93.9 kg    General appearance: Awake alert.  In no distress.  Distracted. Resp: Mildly tachypneic.  Coarse breath sounds bilaterally.  Few crackles at the bases.  No wheezing or rhonchi. Cardio: S1-S2 is normal regular.  No S3-S4.  No rubs murmurs or bruit GI: Abdomen is soft.  Nontender nondistended.  Bowel sounds are present normal.  No masses organomegaly Extremities: No edema.   Neurologic:  No focal neurological deficits.    Lab Results:  Data Reviewed: I have personally reviewed following labs and imaging studies  CBC: Recent Labs  Lab 01/31/20 1808 02/01/20 1047 02/02/20 0424  WBC 2.9* 5.4 4.1  NEUTROABS 1.5* 4.1 3.4  HGB 13.2 13.0 13.4  HCT 42.1 41.8 43.5  MCV 97.2 97.2 97.5  PLT 93* 102* 110*    Basic Metabolic Panel: Recent Labs  Lab 01/31/20 1743 02/01/20 1047 02/02/20 0424  NA 138 138 139  K 4.0 4.2 4.1  CL 101 101 104  CO2 28 29  25   GLUCOSE 93 154* 216*  BUN 29* 23 26*  CREATININE 1.08* 0.90 0.80  CALCIUM 8.3* 8.1* 8.5*  MG  --   --  2.0  PHOS  --   --  2.7    GFR: Estimated Creatinine Clearance: 56.7 mL/min (by C-G formula based on SCr of 0.8 mg/dL).  Liver Function Tests: Recent Labs  Lab 01/31/20 1743 02/02/20 0424  AST 27 25  ALT 22 19  ALKPHOS 58 50  BILITOT 0.8 0.4  PROT 6.6 6.1*  ALBUMIN 3.1* 2.8*     CBG: Recent Labs  Lab 02/01/20 1158 02/01/20 1811 02/01/20 2053 02/02/20 0735 02/02/20 1136  GLUCAP 173* 172* 250* 146* 238*    Lipid Profile: Recent Labs    01/31/20 1743  TRIG 63    Anemia Panel: Recent Labs    01/31/20 1743 02/02/20 0424  FERRITIN 145 169    Recent Results (from the past 240 hour(s))  SARS Coronavirus 2 by RT PCR (hospital order, performed in [facility] hospital lab) Nasopharyngeal Nasopharyngeal Swab     Status: Abnormal   Collection Time: 01/31/20  5:43 PM   Specimen: Nasopharyngeal Swab  Result Value Ref Range Status   SARS Coronavirus 2 POSITIVE (A) NEGATIVE Final    Comment: RESULT CALLED TO, READ BACK BY AND VERIFIED WITH: SMITH,J. RN @1925  01/31/20 BILLINGSLEY,L (NOTE) SARS-CoV-2 target nucleic acids are DETECTED  SARS-CoV-2 RNA is generally detectable in upper respiratory specimens  during the acute phase of infection.  Positive results are indicative  of the presence of the identified virus, but do not rule out bacterial infection or co-infection with other pathogens not detected by the test.  Clinical correlation with patient history and  other diagnostic information is necessary to determine patient infection status.  The expected result is negative.  Fact Sheet for Patients:     Fact Sheet for Healthcare Providers:   04/01/20    This test is not yet approved or cleared by the [employer] FDA and  has been authorized for detection and/or diagnosis of  SARS-CoV-2 by FDA under an Emergency Use Authorization (EUA).  This EUA will remain  in effect (meaning thi s test can be used) for the duration of  the COVID-19 declaration under Section 564(b)(1) of the Act, 21 U.S.C. section 360-bbb-3(b)(1), unless the authorization is terminated or revoked sooner.  Performed at [facility], [address] [place], [place] [address]   Blood Culture (routine x 2)     Status: None (Preliminary result)   Collection Time: 01/31/20  5:43 PM   Specimen: BLOOD  Result Value Ref Range Status   Specimen Description   Final    BLOOD RIGHT ANTECUBITAL Performed at [facility], [address], [place], [place] [address]    Special Requests   Final    BOTTLES DRAWN AEROBIC AND ANAEROBIC Blood Culture adequate volume Performed at [facility], [address], [place], [place] [address]    Culture  Setup Time   Final    GRAM POSITIVE COCCI IN CLUSTERS ANAEROBIC BOTTLE ONLY Organism ID to follow CRITICAL RESULT CALLED TO, READ BACK BY AND VERIFIED WITH: Maximino Greenland PharmD 16:00 02/01/20 ([email or web address])    Culture   Final    GRAM POSITIVE COCCI IDENTIFICATION TO FOLLOW Performed at [facility], [address] [place], [place] [address]    Report Status PENDING  Incomplete  Blood Culture (routine x 2)     Status: None (Preliminary result)   Collection Time: 01/31/20  5:43 PM   Specimen: BLOOD  Result Value Ref Range Status   Specimen Description   Final    BLOOD RIGHT ANTECUBITAL Performed at [facility], [address] [place], [place] [address]    Special Requests   Final    BOTTLES DRAWN AEROBIC AND ANAEROBIC Blood Culture results may not be optimal due to an excessive volume of blood received in culture bottles Performed at [facility], [address], [place], [place] [address]    Culture   Final    NO GROWTH 2 DAYS Performed at [facility], [address], [place], [place] [address]    Report Status PENDING  Incomplete  Blood Culture ID Panel (Reflexed)     Status: Abnormal   Collection Time: 01/31/20  5:43 PM  Result Value Ref Range Status   Enterococcus faecalis NOT DETECTED NOT DETECTED Final   Enterococcus Faecium NOT DETECTED NOT DETECTED Final   Listeria monocytogenes NOT DETECTED NOT DETECTED Final   Staphylococcus species DETECTED (A) NOT DETECTED Final    Comment: CRITICAL RESULT CALLED TO, READ BACK BY AND VERIFIED WITH: Maximino Greenland PharmD 16:00 02/01/20 ([email or web address])    Staphylococcus aureus (BCID) NOT DETECTED NOT DETECTED Final   Staphylococcus epidermidis NOT DETECTED NOT DETECTED Final   Staphylococcus lugdunensis NOT DETECTED NOT DETECTED Final   Streptococcus species NOT DETECTED NOT DETECTED Final   Streptococcus agalactiae NOT DETECTED NOT DETECTED Final   Streptococcus pneumoniae NOT DETECTED NOT DETECTED Final   Streptococcus pyogenes NOT DETECTED NOT DETECTED Final   A.calcoaceticus-baumannii NOT DETECTED NOT DETECTED Final   Bacteroides fragilis NOT DETECTED NOT DETECTED Final   Enterobacterales NOT DETECTED NOT DETECTED Final   Enterobacter cloacae complex NOT DETECTED NOT DETECTED Final   Escherichia coli NOT DETECTED NOT DETECTED Final   Klebsiella aerogenes NOT DETECTED NOT DETECTED Final   Klebsiella oxytoca NOT DETECTED NOT DETECTED Final   Klebsiella pneumoniae NOT DETECTED NOT DETECTED Final   Proteus species NOT DETECTED NOT DETECTED Final   Salmonella species NOT DETECTED NOT DETECTED Final   Serratia marcescens  NOT DETECTED NOT DETECTED Final   Haemophilus influenzae NOT DETECTED NOT DETECTED Final   Neisseria meningitidis NOT DETECTED NOT DETECTED Final   Pseudomonas aeruginosa NOT DETECTED NOT DETECTED Final   Stenotrophomonas maltophilia NOT DETECTED NOT DETECTED Final   Candida albicans NOT DETECTED NOT DETECTED Final   Candida auris NOT DETECTED NOT DETECTED Final   Candida glabrata NOT  DETECTED NOT DETECTED Final   Candida krusei NOT DETECTED NOT DETECTED Final   Candida parapsilosis NOT DETECTED NOT DETECTED Final   Candida tropicalis NOT DETECTED NOT DETECTED Final   Cryptococcus neoformans/gattii NOT DETECTED NOT DETECTED Final    Comment: Performed at [facility], [address] [place], [place] [address]      Radiology Studies: CT ANGIO CHEST PE W OR WO CONTRAST  Result Date: 02/01/2020 CLINICAL DATA:  Shortness of breath.  COVID positive. EXAM: CT ANGIOGRAPHY CHEST WITH CONTRAST TECHNIQUE: Multidetector CT imaging of the chest was performed using the standard protocol during bolus administration of intravenous contrast. Multiplanar CT image reconstructions and MIPs were obtained to evaluate the vascular anatomy. CONTRAST:  OMNIPAQUE IOHEXOL 350 MG/ML SOLN COMPARISON:  None. FINDINGS: Cardiovascular: There is no acute pulmonary embolism. The heart size is significantly enlarged. There is no significant pericardial effusion. There are coronary artery calcifications. There are atherosclerotic changes of the thoracic aorta without evidence for an aneurysm. Mediastinum/Nodes: -- No mediastinal lymphadenopathy. -- No hilar lymphadenopathy. -- No axillary lymphadenopathy. -- No supraclavicular lymphadenopathy. -- Normal thyroid gland where visualized. -  Unremarkable esophagus. Lungs/Pleura: There are ground-glass airspace opacities bilaterally, greatest within the bilateral lower lobes. There is atelectasis in the dependent portions of the bilateral lower lobes. There is no pneumothorax. The trachea is unremarkable. Upper Abdomen: Contrast bolus timing is not optimized for evaluation of the abdominal organs. The visualized portions of the organs of the upper abdomen are normal. Musculoskeletal: No chest wall abnormality. No bony spinal canal stenosis. Review of the MIP images confirms the above findings. IMPRESSION: 1. No evidence for acute pulmonary embolism. 2. Bilateral  ground-glass airspace opacities, greatest within the bilateral lower lobes, which can be seen in patients with COVID-19 pneumonia. 3. Cardiomegaly with coronary artery disease. Aortic Atherosclerosis (ICD10-I70.0). Electronically Signed   By: Katherine Mantle M.D.   On: 02/01/2020 15:20   DG Chest Port 1 View  Result Date: 01/31/2020 CLINICAL DATA:  COVID-19 positive 5 days ago, hypoxia EXAM: PORTABLE CHEST 1 VIEW COMPARISON:  06/25/2016 FINDINGS: Single frontal view of the chest demonstrates an unremarkable cardiac silhouette. Diffuse interstitial prominence unchanged. No airspace disease, effusion, or pneumothorax. No acute bony abnormalities. IMPRESSION: 1. No acute intrathoracic process. Electronically Signed   By: Sharlet Salina M.D.   On: 01/31/2020 17:27       LOS: 1 day   Geneviene Tesch  [facility] Pager on [email or web address]  02/02/2020, 1:41 PM

## 2020-02-02 NOTE — [place] FL2 (Signed)
[place] MEDICAID FL2 LEVEL OF CARE SCREENING TOOL     IDENTIFICATION  Patient Name: Kathy Thomas Birthdate: 06/17/1934 Sex: female Admission Date (Current Location): 02/01/2020  [facility] and [place] Number:  Producer, television/film/video and Address:  [facility],  [address] [place], [place] [address]      Provider Number: [id number]  Attending Physician Name and Address:  Osvaldo Shipper, MD  Relative Name and Phone Number:  [name] (Daughter) [phone]    Current Level of Care: Hospital Recommended Level of Care: Skilled Nursing Facility Prior Approval Number:    Date Approved/Denied:   PASRR Number: [id number]  Discharge Plan: SNF    Current Diagnoses: Patient Active Problem List   Diagnosis Date Noted  . Pneumonia due to COVID-19 virus 02/01/2020  . Acute respiratory failure with hypoxia (HCC) 02/01/2020  . Protein calorie malnutrition (HCC) 02/01/2020  . Thrombocytopenia (HCC) 02/01/2020  . Encounter for examination for admission to assisted living facility 10/20/2019  . Pain due to onychomycosis of toenails of both feet 12/20/2018  . Diabetes mellitus without complication (HCC) 12/20/2018  . Diarrhea 03/07/2018  . Acute right ankle pain 04/08/2017  . Weight loss 06/25/2016  . Iron deficiency 12/30/2015  . Generalized weakness 07/08/2014  . Systolic CHF, chronic (HCC) 07/07/2014  . Vitamin B12 deficiency 07/06/2014  . Left fibular fracture 07/05/2014  . Closed fibular fracture 07/05/2014  . Left ankle pain 07/04/2014  . Left knee pain 07/04/2014  . Gait disorder 01/10/2014  . Ruptured sebaceous cyst 10/16/2013  . Dementia without behavioral disturbance (HCC) 09/08/2013  . Left lumbar radiculopathy 10/28/2011  . Grief reaction 10/28/2011  . Insect bite, infected 10/28/2011  . Preventative health care 12/06/2010  . OSTEOPOROSIS 06/04/2010  . ECCHYMOSES, SPONTANEOUS 06/04/2010  . ANEMIA-IRON DEFICIENCY 10/26/2008  . MENOPAUSAL DISORDER  10/26/2008  . DM type 2 (diabetes mellitus, type 2) (HCC) 10/06/2007  . Hyperlipidemia 10/06/2007  . ANXIETY 10/06/2007  . CHOLELITHIASIS 10/06/2007  . SHOULDER PAIN, LEFT 10/06/2007  . LOW BACK PAIN 01/29/2007  . Essential hypertension 01/26/2007  . GERD 01/26/2007  . DIVERTICULOSIS, COLON 01/26/2007  . HERNIATED DISC 01/26/2007  . History of colonic polyps 01/26/2007    Orientation RESPIRATION BLADDER Height & Weight     Self  O2 (2L [place]) External catheter Weight: 93.9 kg Height:  5\' 1"  (154.9 cm)  BEHAVIORAL SYMPTOMS/MOOD NEUROLOGICAL BOWEL NUTRITION STATUS   (none)  (none) Incontinent Diet (see d/c summary)  AMBULATORY STATUS COMMUNICATION OF NEEDS Skin   Extensive Assist Verbally Normal                       Personal Care Assistance Level of Assistance  Bathing, Feeding, Dressing Bathing Assistance: Maximum assistance Feeding assistance: Independent Dressing Assistance: Limited assistance     Functional Limitations Info  Sight, Hearing, Speech Sight Info: Adequate Hearing Info: Impaired Speech Info: Adequate    SPECIAL CARE FACTORS FREQUENCY  PT (By licensed PT)     PT Frequency: 5X/W              Contractures Contractures Info: Not present    Additional Factors Info  Code Status, Allergies Code Status Info: full Allergies Info: codeine, penicillins, morphine           Current Medications (02/02/2020):  This is the current hospital active medication list Current Facility-Administered Medications  Medication Dose Route Frequency Provider Last Rate Last Admin  . acetaminophen (TYLENOL) tablet 650 mg  650 mg Oral Q6H PRN 04/03/2020,  Rondell A, MD      . albuterol (VENTOLIN HFA) 108 (90 Base) MCG/ACT inhaler 2 puff  2 puff Inhalation Q6H Smith, Rondell A, MD   2 puff at 02/02/20 1500  . ascorbic acid (VITAMIN C) tablet 500 mg  500 mg Oral Daily Madelyn Flavors A, MD   500 mg at 02/02/20 0837  . aspirin EC tablet 81 mg  81 mg Oral Daily Terald Sleeper, MD   81 mg at 02/01/20 4403  . atorvastatin (LIPITOR) tablet 40 mg  40 mg Oral Daily Terald Sleeper, MD   40 mg at 02/02/20 0837  . calcium-vitamin D (OSCAL WITH D) 500-200 MG-UNIT per tablet 1 tablet  1 tablet Oral Daily Trifan, Kermit Balo, MD   1 tablet at 02/02/20 [phone]  . enoxaparin (LOVENOX) injection 40 mg  40 mg Subcutaneous Q24H Katrinka Blazing, Rondell A, MD   40 mg at 02/01/20 2149  . famotidine (PEPCID) tablet 20 mg  20 mg Oral Daily Osvaldo Shipper, MD   20 mg at 02/02/20 1459  . feeding supplement (GLUCERNA SHAKE) (GLUCERNA SHAKE) liquid 237 mL  237 mL Oral TID BM Smith, Rondell A, MD   237 mL at 02/02/20 1500  . guaiFENesin-dextromethorphan (ROBITUSSIN DM) 100-10 MG/5ML syrup 10 mL  10 mL Oral Q4H PRN Smith, Rondell A, MD      . insulin aspart (novoLOG) injection 0-5 Units  0-5 Units Subcutaneous QHS Clydie Braun, MD   2 Units at 02/01/20 2148  . insulin aspart (novoLOG) injection 0-9 Units  0-9 Units Subcutaneous TID WC Madelyn Flavors A, MD   3 Units at 02/02/20 1210  . losartan (COZAAR) tablet 100 mg  100 mg Oral Daily Katrinka Blazing, Rondell A, MD   100 mg at 02/02/20 0850  . methylPREDNISolone sodium succinate (SOLU-MEDROL) 125 mg/2 mL injection 46.875 mg  0.5 mg/kg Intravenous Q12H Madelyn Flavors A, MD   46.875 mg at 02/02/20 0833   Followed by  . [START ON 02/04/2020] predniSONE (DELTASONE) tablet 50 mg  50 mg Oral Daily Smith, Rondell A, MD      . remdesivir 100 mg in sodium chloride 0.9 % 100 mL IVPB  100 mg Intravenous Daily Danford Bad, RPH 200 mL/hr at 02/02/20 0833 100 mg at 02/02/20 0833  . sodium chloride flush (NS) 0.9 % injection 3 mL  3 mL Intravenous Q12H Smith, Rondell A, MD   3 mL at 02/01/20 2150  . vitamin B-12 (CYANOCOBALAMIN) tablet 1,000 mcg  1,000 mcg Oral Daily Terald Sleeper, MD   1,000 mcg at 02/02/20 0837  . zinc sulfate capsule 220 mg  220 mg Oral Daily Madelyn Flavors A, MD   220 mg at 02/02/20 5956     Discharge Medications: Please see discharge summary  for a list of discharge medications.  Relevant Imaging Results:  Relevant Lab Results:   Additional Information [phone] [place], [place]

## 2020-02-03 LAB — COMPREHENSIVE METABOLIC PANEL
ALT: 22 U/L (ref 0–44)
AST: 28 U/L (ref 15–41)
Albumin: 2.9 g/dL — ABNORMAL LOW (ref 3.5–5.0)
Alkaline Phosphatase: 68 U/L (ref 38–126)
Anion gap: 10 (ref 5–15)
BUN: 40 mg/dL — ABNORMAL HIGH (ref 8–23)
CO2: 28 mmol/L (ref 22–32)
Calcium: 8.8 mg/dL — ABNORMAL LOW (ref 8.9–10.3)
Chloride: 103 mmol/L (ref 98–111)
Creatinine, Ser: 0.87 mg/dL (ref 0.44–1.00)
GFR calc Af Amer: >60 mL/min (ref >60)
GFR calc non Af Amer: >60 mL/min (ref >60)
Glucose, Bld: 222 mg/dL — ABNORMAL HIGH (ref 70–99)
Potassium: 4.7 mmol/L (ref 3.5–5.1)
Sodium: 141 mmol/L (ref 135–145)
Total Bilirubin: 0.5 mg/dL (ref 0.3–1.2)
Total Protein: 6.6 g/dL (ref 6.5–8.1)

## 2020-02-03 LAB — GLUCOSE, CAPILLARY
Glucose-Capillary: 203 mg/dL — ABNORMAL HIGH (ref 70–99)
Glucose-Capillary: 225 mg/dL — ABNORMAL HIGH (ref 70–99)
Glucose-Capillary: 354 mg/dL — ABNORMAL HIGH (ref 70–99)
Glucose-Capillary: 372 mg/dL — ABNORMAL HIGH (ref 70–99)

## 2020-02-03 LAB — CBC WITH DIFFERENTIAL/PLATELET
Abs Immature Granulocytes: 0.05 10*3/uL (ref 0.00–0.07)
Basophils Absolute: 0 10*3/uL (ref 0.0–0.1)
Basophils Relative: 0 %
Eosinophils Absolute: 0 10*3/uL (ref 0.0–0.5)
Eosinophils Relative: 0 %
HCT: 43.4 % (ref 36.0–46.0)
Hemoglobin: 13.7 g/dL (ref 12.0–15.0)
Immature Granulocytes: 0 %
Lymphocytes Relative: 7 %
Lymphs Abs: 0.8 10*3/uL (ref 0.7–4.0)
MCH: 30.4 pg (ref 26.0–34.0)
MCHC: 31.6 g/dL (ref 30.0–36.0)
MCV: 96.2 fL (ref 80.0–100.0)
Monocytes Absolute: 0.5 10*3/uL (ref 0.1–1.0)
Monocytes Relative: 5 %
Neutro Abs: 10.1 10*3/uL — ABNORMAL HIGH (ref 1.7–7.7)
Neutrophils Relative %: 88 %
Platelets: 153 10*3/uL (ref 150–400)
RBC: 4.51 MIL/uL (ref 3.87–5.11)
RDW: 12.6 % (ref 11.5–15.5)
WBC: 11.5 10*3/uL — ABNORMAL HIGH (ref 4.0–10.5)
nRBC: 0 % (ref 0.0–0.2)

## 2020-02-03 LAB — C-REACTIVE PROTEIN: CRP: 6.9 mg/dL — ABNORMAL HIGH (ref <1.0)

## 2020-02-03 LAB — D-DIMER, QUANTITATIVE: D-Dimer, Quant: 2.67 ug/mL-FEU — ABNORMAL HIGH (ref 0.00–0.50)

## 2020-02-03 MED ORDER — INSULIN GLARGINE 100 UNIT/ML ~~LOC~~ SOLN
5.0000 [IU] | Freq: Every day | SUBCUTANEOUS | Status: DC
  Administered 2020-02-03: 5 [IU] via SUBCUTANEOUS
  Filled 2020-02-03: qty 0.05

## 2020-02-03 NOTE — TOC Progression Note (Signed)
Transition of Care [facility]) - Progression Note    Patient Details  Name: Kathy Thomas MRN: [id number] Date of Birth: 25-Oct-1934  Transition of Care [facility]) CM/SW Contact  Ida Rogue, [place] Phone Number: 02/03/2020, 10:56 AM  Clinical Narrative:   Called daughter to give her update of bed offers.  She has done some research and is adamant that her mother will not go to [facility]. Says she will take her home before sending her there.  She asked me why I did not refer her to [facility] in [place] because she was told by an ED social worker that was an option.  I told her that is not an option, but if she calls there and finds out differently, she should let me know.  I told her I would reach out to the facilities that did not accept to ask them to review. I texted [place] at [place] and [place] at [place] to find out if they were working today.  No response. Tonya at [facility] responded [facility] had declined] and told me she would like into it further and find out why they declined.  She did not call back. TOC will continue to follow during the course of hospitalization.     Expected Discharge Plan: Skilled Nursing Facility Barriers to Discharge: SNF Pending bed offer  Expected Discharge Plan and Services Expected Discharge Plan: Skilled Nursing Facility   Discharge Planning Services: CM Consult   Living arrangements for the past 2 months: Single Family Home                                       Social Determinants of Health (SDOH) Interventions    Readmission Risk Interventions No flowsheet data found.

## 2020-02-03 NOTE — Progress Notes (Signed)
Physical Therapy Treatment Patient Details Name: Kathy Thomas MRN: [id number] DOB: Jul 22, 1934 Today's Date: 02/03/2020    History of Present Illness 85 y.o. female with past stroke, dementia, diabetes, HTN, LBP, and osteoporosis presents with c/o COVID + and is currently being treated for covid PNA.    PT Comments    Pt progressing toward PT goals. Improved activity tolerance today, confused but cooperative and pleasant.   Trial of RA, at rest SPO2=85% within 3 minutes.   O2 replaced at 2L [place], SpO2=87 to 91% during activity. HR 50-60s.   Follow Up Recommendations  SNF     Equipment Recommendations  Other (comment) (TBD)    Recommendations for Other Services       Precautions / Restrictions Precautions Precautions: Fall Restrictions Weight Bearing Restrictions: No    Mobility  Bed Mobility               General bed mobility comments: in recliner  Transfers Overall transfer level: Needs assistance Equipment used: Rolling walker (2 wheeled) Transfers: Sit to/from Stand Sit to Stand: Min assist         General transfer comment: min assist to power up, multi-modal cues   Ambulation/Gait Ambulation/Gait assistance: Min assist Gait Distance (Feet): 25 Feet Assistive device: Rolling walker (2 wheeled) Gait Pattern/deviations: Step-through pattern;Decreased stride length;Trunk flexed     General Gait Details: cues for RW position and overall safety, trunk extension   Stairs             Wheelchair Mobility    Modified Rankin (Stroke Patients Only)       Balance   Sitting-balance support: Feet supported;Single extremity supported Sitting balance-Leahy Scale: Fair     Standing balance support: During functional activity;Bilateral upper extremity supported Standing balance-Leahy Scale: Poor Standing balance comment: reliant on UE for support                            Cognition Arousal/Alertness: Awake/alert Behavior During  Therapy: WFL for tasks assessed/performed Overall Cognitive Status: No family/caregiver present to determine baseline cognitive functioning                                 General Comments: hx of dementia, oriented to self and hospital, asking day/time/date, unaware situation; follows one step commands, occasional redirection      Exercises General Exercises - Lower Extremity Ankle Circles/Pumps: AROM;10 reps;Both Long Arc Quad: AROM;Both;10 reps;Seated Hip ABduction/ADduction: AROM;Both;10 reps    General Comments        Pertinent Vitals/Pain Pain Assessment: No/denies pain    Home Living                      Prior Function            PT Goals (current goals can now be found in the care plan section) Acute Rehab PT Goals Patient Stated Goal: none stated PT Goal Formulation: With patient Time For Goal Achievement: 02/16/20 Potential to Achieve Goals: Good Progress towards PT goals: Progressing toward goals    Frequency    Min 2X/week      PT Plan Current plan remains appropriate    Co-evaluation              AM-PAC PT "6 Clicks" Mobility   Outcome Measure  Help needed turning from your back to your side while in a flat bed  without using bedrails?: A Little Help needed moving from lying on your back to sitting on the side of a flat bed without using bedrails?: A Little Help needed moving to and from a bed to a chair (including a wheelchair)?: A Little Help needed standing up from a chair using your arms (e.g., wheelchair or bedside chair)?: A Little Help needed to walk in hospital room?: A Little Help needed climbing 3-5 steps with a railing? : A Lot 6 Click Score: 17    End of Session Equipment Utilized During Treatment: Gait belt Activity Tolerance: Patient tolerated treatment well Patient left: in chair;with call bell/phone within reach;with chair alarm set Nurse Communication: Mobility status PT Visit Diagnosis:  Unsteadiness on feet (R26.81);Other abnormalities of gait and mobility (R26.89);Muscle weakness (generalized) (M62.81)     Time: 4917-9150 PT Time Calculation (min) (ACUTE ONLY): 24 min  Charges:  $Gait Training: 8-22 mins $Therapeutic Exercise: 8-22 mins                     Delice Bison, PT  Acute Rehab Dept ([facility]) [phone] Pager [phone]  02/03/2020    [facility] 02/03/2020, 3:04 PM

## 2020-02-03 NOTE — Progress Notes (Addendum)
PROGRESS NOTE  Kathy Thomas:[id number] DOB: [birth year] DOA: 02/01/2020  PCP: [name], MD  Brief History/Interval Summary: 85 y.o. female with medical history significant of hypertension, hyperlipidemia, TIAs, diabetes mellitus type 2, anxiety, dementia, and recently tested positive for COVID-19 on 9/4 presented with weakness on 9/9.    Patient's daughter had been taking care of her at home monitoring her symptoms and oxygenation.  Had noted at times that her sats would drop into the low 80s, but improved after coughing.  Had called the emergency room on 9/8, but they discharged her home after giving her monoclonal antibodies.  However, after she got home, she was still quite weak and that her knees buckled and so EMS was called and she was brought back to the hospital.  The emergency room, noted to have oxygen saturations around 87% on room air which improved above 90% on 2 L nasal cannula.  Patient started on IV Remdisivir, oxygen and steroids.  Admitted to the hospitalist service.   Over the past few days, patient has been slowly improving, down to 1-2 L.  Mildly confused, but according to daughter, more alert than she had been when she first came in.  Patient herself with no major complaints.    Consultants: None  Procedures: None  Antibiotics: Anti-infectives (From admission, onward)   Start     Dose/Rate Route Frequency Ordered Stop   02/02/20 1000  remdesivir 100 mg in sodium chloride 0.9 % 100 mL IVPB       "Followed by" Linked Group Details   100 mg 200 mL/hr over 30 Minutes Intravenous Daily 02/01/20 1051 02/06/20 0959   02/01/20 1200  remdesivir 200 mg in sodium chloride 0.9% 250 mL IVPB       "Followed by" Linked Group Details   200 mg 580 mL/hr over 30 Minutes Intravenous Once 02/01/20 1051 02/01/20 1316         Assessment/Plan:  Acute Hypoxic Resp. Failure/Pneumonia due to COVID-19   Recent Labs  Lab 01/31/20 1743 02/02/20 0424 02/03/20 0427    DDIMER 1.77* 2.29* 2.67*  FERRITIN 145 169  --   CRP 3.2* 9.6* 6.9*  ALT 22 19 22   PROCALCITON <0.10  --   --     Objective findings: Fever: Low-grade fever noted Oxygen requirements: Patient currently on 1-2 L of oxygen by nasal cannula saturating in the early 90s  COVID 19 Therapeutics: Antibacterials: None.  Procalcitonin was less than 0.1 Remdesivir: Day 3 Steroids: On Solu-Medrol Diuretics: None Actemra/Baricitinib: Not initiated yet PUD Prophylaxis: Will initiate Pepcid DVT Prophylaxis:  Lovenox  From a respiratory standpoint patient seems to be stable.  Requiring 1 to 2 L of oxygen currently.  Her D-dimer noted to be higher today at 2.29.  Continue to trend.  CRP peaked as high as 9.6 on 9/10, continues to trend downward.  Will follow.  Continue steroids and Remdesivir, which she completes on Monday.  Incentive spirometry mobilization.  PT and OT evaluation.  The treatment plan and use of medications and known side effects were discussed with patient/family. Some of the medications used are based on case reports/anecdotal data.  All other medications being used in the management of COVID-19 based on limited study data.  Complete risks and long-term side effects are unknown, however in the best clinical judgment they seem to be of some benefit.  Patient/family wanted to proceed with treatment options provided.  Bacteremia 1 set of blood cultures growing gram-positive cocci in clusters.  Suspect  contaminant.  White count is normal and patient is afebrile.  Not on any antibiotics.  Sinus bradycardia Thought to be due to with donepezil.  On hold currently.  Patient seems to be asymptomatic.  Prolonged QT interval Avoid QT prolonging medication.  Monitor electrolytes.  Morbid obesity: Patient meets criteria for BMI greater than 35+ comorbidities of hypertension and diabetes  Diabetes mellitus type 2 HbA1c 6.2 in June.  Holding Actos.  SSI.  Essential hypertension Continue  to monitor blood pressures.  Continue losartan.  History of dementia Holding donepezil due to bradycardia.  Thrombocytopenia Platelet count is low but stable.  Continue to monitor.  Obesity Estimated body mass index is 39.11 kg/m as calculated from the following:   Height as of this encounter: 5\' 1"  (1.549 m).   Weight as of this encounter: 93.9 kg.  DVT Prophylaxis: Lovenox Code Status: Full code Family Communication: Updated daughter by phone. Disposition Plan: Physical therapy recommending skilled nursing although patient's daughter has limited skilled nursing options due to Covid and patient's insurance.  May not end up sending her to skilled nursing may go home with daughter and home health   Status is: Inpatient  Remains inpatient appropriate because:IV treatments appropriate due to intensity of illness or inability to take PO and Inpatient level of care appropriate due to severity of illness   Dispo: The patient is from: Home              Anticipated d/c is to: Home versus skilled nursing              Anticipated d/c date is: 9/13 or 9/14              Patient currently is not medically stable to d/c.     Medications:  Scheduled: . albuterol  2 puff Inhalation Q6H  . vitamin C  500 mg Oral Daily  . aspirin EC  81 mg Oral Daily  . atorvastatin  40 mg Oral Daily  . calcium-vitamin D  1 tablet Oral Daily  . enoxaparin (LOVENOX) injection  40 mg Subcutaneous Q24H  . famotidine  20 mg Oral Daily  . feeding supplement (GLUCERNA SHAKE)  237 mL Oral TID BM  . insulin aspart  0-5 Units Subcutaneous QHS  . insulin aspart  0-9 Units Subcutaneous TID WC  . insulin glargine  5 Units Subcutaneous QHS  . losartan  100 mg Oral Daily  . methylPREDNISolone (SOLU-MEDROL) injection  0.5 mg/kg Intravenous Q12H   Followed by  . [START ON 02/04/2020] predniSONE  50 mg Oral Daily  . sodium chloride flush  3 mL Intravenous Q12H  . cyanocobalamin  1,000 mcg Oral Daily  . zinc sulfate   220 mg Oral Daily   Continuous: . remdesivir 100 mg in NS 100 mL Stopped (02/03/20 0953)   04/04/20, guaiFENesin-dextromethorphan   Objective:  Vital Signs  Vitals:   02/02/20 1337 02/02/20 2035 02/03/20 0546 02/03/20 1413  BP: (!) 130/58 (!) 146/50 (!) 155/62 110/65  Pulse: (!) 58 (!) 58 (!) 52 (!) 58  Resp: 16 16 20 20   Temp: 98.1 F (36.7 C) 98.1 F (36.7 C) 97.6 F (36.4 C) 97.8 F (36.6 C)  TempSrc: Oral   Oral  SpO2: 96% 99% 96% 99%  Weight:      Height:        Intake/Output Summary (Last 24 hours) at 02/03/2020 1735 Last data filed at 02/03/2020 0700 Gross per 24 hour  Intake 584 ml  Output 850 ml  Net -266 ml   Filed Weights   02/01/20 0234  Weight: 93.9 kg    General appearance: Alert and oriented x2, no acute distress HEENT: Normocephalic and atraumatic, mucous membranes are moist Resp: Few basilar crackles, otherwise clear to auscultation bilaterally Cardio: Regular rate and rhythm, S1-S2 GI: Soft, nontender, nondistended, positive bowel sounds Extremities: No clubbing or cyanosis or edema Psychiatry: Underlying chronic dementia, but no evidence of acute psychosis Neurologic:  No focal neurological deficits.    Lab Results:  Data Reviewed: I have personally reviewed following labs and imaging studies  CBC: Recent Labs  Lab 01/31/20 1808 02/01/20 1047 02/02/20 0424 02/03/20 0427  WBC 2.9* 5.4 4.1 11.5*  NEUTROABS 1.5* 4.1 3.4 10.1*  HGB 13.2 13.0 13.4 13.7  HCT 42.1 41.8 43.5 43.4  MCV 97.2 97.2 97.5 96.2  PLT 93* 102* 110* 153    Basic Metabolic Panel: Recent Labs  Lab 01/31/20 1743 02/01/20 1047 02/02/20 0424 02/03/20 0427  NA 138 138 139 141  K 4.0 4.2 4.1 4.7  CL 101 101 104 103  CO2 28 29 25 28   GLUCOSE 93 154* 216* 222*  BUN 29* 23 26* 40*  CREATININE 1.08* 0.90 0.80 0.87  CALCIUM 8.3* 8.1* 8.5* 8.8*  MG  --   --  2.0  --   PHOS  --   --  2.7  --     GFR: Estimated Creatinine Clearance: 52.1 mL/min (by C-G  formula based on SCr of 0.87 mg/dL).  Liver Function Tests: Recent Labs  Lab 01/31/20 1743 02/02/20 0424 02/03/20 0427  AST 27 25 28   ALT 22 19 22   ALKPHOS 58 50 68  BILITOT 0.8 0.4 0.5  PROT 6.6 6.1* 6.6  ALBUMIN 3.1* 2.8* 2.9*     CBG: Recent Labs  Lab 02/02/20 1639 02/02/20 2037 02/03/20 0743 02/03/20 1124 02/03/20 1637  GLUCAP 266* 264* 203* 225* 354*    Lipid Profile: Recent Labs    01/31/20 1743  TRIG 63    Anemia Panel: Recent Labs    01/31/20 1743 02/02/20 0424  FERRITIN 145 169    Recent Results (from the past 240 hour(s))  SARS Coronavirus 2 by RT PCR (hospital order[facility]) Nasopharyngeal Nasopharyngeal Swab     Status: Abnormal   Collection Time: 01/31/20  5:43 PM   Specimen: Nasopharyngeal Swab  Result Value Ref Range Status   SARS Coronavirus 2 POSITIVE (A) NEGATIVE Final    Comment: RESULT CALLED TO, READ BACK BY AND VERIFIED WITH: SMITH,J. RN @1925  01/31/20 BILLINGSLEY,L (NOTE) SARS-CoV-2 target nucleic acids are DETECTED  SARS-CoV-2 RNA is generally detectable in upper respiratory specimens  during the acute phase of infection.  Positive results are indicative  of the presence of the identified virus, but do not rule out bacterial infection or co-infection with other pathogens not detected by the test.  Clinical correlation with patient history and  other diagnostic information is necessary to determine patient infection status.  The expected result is negative.  Fact Sheet for Patients:   BoilerBrush.com.cyhttps://www.fda.gov/media/136312/download   Fact Sheet for Healthcare Providers:   https://pope.com/https://www.fda.gov/media/136313/download    This test is not yet approved or cleared by the [place] FDA and  has been authorized for detection and/or diagnosis of SARS-CoV-2 by FDA under an Emergency Use Authorization (EUA).  This EUA will remain in effect (meaning thi s test can be used) for the duration of  the COVID-19  declaration under Section 564(b)(1) of the Act, 21  U.S.C. section 360-bbb-3(b)(1), unless the authorization is terminated or revoked sooner.  Performed at [facility], [address], [place], [place] [address]   Blood Culture (routine x 2)     Status: Abnormal (Preliminary result)   Collection Time: 01/31/20  5:43 PM   Specimen: BLOOD  Result Value Ref Range Status   Specimen Description   Final    BLOOD RIGHT ANTECUBITAL Performed at [facility], [address], [place], [place] [address]    Special Requests   Final    BOTTLES DRAWN AEROBIC AND ANAEROBIC Blood Culture adequate volume Performed at [facility], [address], [place], [place] [address]    Culture  Setup Time   Final    GRAM POSITIVE COCCI IN CLUSTERS ANAEROBIC BOTTLE ONLY Organism ID to follow CRITICAL RESULT CALLED TO, READ BACK BY AND VERIFIED WITH: Maximino Greenland PharmD 16:00 02/01/20 ([email or web address]) Performed at [facility], [address], [place], [place] [address]    Culture STAPHYLOCOCCUS CAPITIS (A)  Final   Report Status PENDING  Incomplete  Blood Culture (routine x 2)     Status: None (Preliminary result)   Collection Time: 01/31/20  5:43 PM   Specimen: BLOOD  Result Value Ref Range Status   Specimen Description   Final    BLOOD RIGHT ANTECUBITAL Performed at [facility], [address] [place], [place] [address]    Special Requests   Final    BOTTLES DRAWN AEROBIC AND ANAEROBIC Blood Culture results may not be optimal due to an excessive volume of blood received in culture bottles Performed at [facility], [address] [place], [place] [address]    Culture   Final    NO GROWTH 3 DAYS Performed at [facility], [address], [place], [place] [address]    Report Status PENDING  Incomplete  Blood Culture ID Panel (Reflexed)     Status: Abnormal   Collection Time: 01/31/20  5:43 PM  Result  Value Ref Range Status   Enterococcus faecalis NOT DETECTED NOT DETECTED Final   Enterococcus Faecium NOT DETECTED NOT DETECTED Final   Listeria monocytogenes NOT DETECTED NOT DETECTED Final   Staphylococcus species DETECTED (A) NOT DETECTED Final    Comment: CRITICAL RESULT CALLED TO, READ BACK BY AND VERIFIED WITH: Maximino Greenland PharmD 16:00 02/01/20 ([email or web address])    Staphylococcus aureus (BCID) NOT DETECTED NOT DETECTED Final   Staphylococcus epidermidis NOT DETECTED NOT DETECTED Final   Staphylococcus lugdunensis NOT DETECTED NOT DETECTED Final   Streptococcus species NOT DETECTED NOT DETECTED Final   Streptococcus agalactiae NOT DETECTED NOT DETECTED Final   Streptococcus pneumoniae NOT DETECTED NOT DETECTED Final   Streptococcus pyogenes NOT DETECTED NOT DETECTED Final   A.calcoaceticus-baumannii NOT DETECTED NOT DETECTED Final   Bacteroides fragilis NOT DETECTED NOT DETECTED Final   Enterobacterales NOT DETECTED NOT DETECTED Final   Enterobacter cloacae complex NOT DETECTED NOT DETECTED Final   Escherichia coli NOT DETECTED NOT DETECTED Final   Klebsiella aerogenes NOT DETECTED NOT DETECTED Final   Klebsiella oxytoca NOT DETECTED NOT DETECTED Final   Klebsiella pneumoniae NOT DETECTED NOT DETECTED Final   Proteus species NOT DETECTED NOT DETECTED Final   Salmonella species NOT DETECTED NOT DETECTED Final   Serratia marcescens NOT DETECTED NOT DETECTED Final   Haemophilus influenzae NOT DETECTED NOT DETECTED Final   Neisseria meningitidis NOT DETECTED NOT DETECTED Final   Pseudomonas aeruginosa NOT DETECTED NOT DETECTED Final  Stenotrophomonas maltophilia NOT DETECTED NOT DETECTED Final   Candida albicans NOT DETECTED NOT DETECTED Final   Candida auris NOT DETECTED NOT DETECTED Final   Candida glabrata NOT DETECTED NOT DETECTED Final   Candida krusei NOT DETECTED NOT DETECTED Final   Candida parapsilosis NOT DETECTED NOT DETECTED Final   Candida tropicalis NOT DETECTED NOT DETECTED  Final   Cryptococcus neoformans/gattii NOT DETECTED NOT DETECTED Final    Comment: Performed at [facility], [address], [place], [place] [address]      Radiology Studies: No results found.     LOS: 2 days   Zadyn Yardley Mordecai Rasmussen  [facility] Pager on [email or web address]  02/03/2020, 5:35 PM

## 2020-02-04 LAB — CULTURE, BLOOD (ROUTINE X 2): Special Requests: ADEQUATE

## 2020-02-04 LAB — CBC WITH DIFFERENTIAL/PLATELET
Abs Immature Granulocytes: 0.09 10*3/uL — ABNORMAL HIGH (ref 0.00–0.07)
Basophils Absolute: 0 10*3/uL (ref 0.0–0.1)
Basophils Relative: 0 %
Eosinophils Absolute: 0 10*3/uL (ref 0.0–0.5)
Eosinophils Relative: 0 %
HCT: 42 % (ref 36.0–46.0)
Hemoglobin: 13.2 g/dL (ref 12.0–15.0)
Immature Granulocytes: 1 %
Lymphocytes Relative: 4 %
Lymphs Abs: 0.5 10*3/uL — ABNORMAL LOW (ref 0.7–4.0)
MCH: 30.3 pg (ref 26.0–34.0)
MCHC: 31.4 g/dL (ref 30.0–36.0)
MCV: 96.6 fL (ref 80.0–100.0)
Monocytes Absolute: 0.5 10*3/uL (ref 0.1–1.0)
Monocytes Relative: 4 %
Neutro Abs: 11 10*3/uL — ABNORMAL HIGH (ref 1.7–7.7)
Neutrophils Relative %: 91 %
Platelets: 185 10*3/uL (ref 150–400)
RBC: 4.35 MIL/uL (ref 3.87–5.11)
RDW: 12.4 % (ref 11.5–15.5)
WBC: 12.1 10*3/uL — ABNORMAL HIGH (ref 4.0–10.5)
nRBC: 0 % (ref 0.0–0.2)

## 2020-02-04 LAB — GLUCOSE, CAPILLARY
Glucose-Capillary: 205 mg/dL — ABNORMAL HIGH (ref 70–99)
Glucose-Capillary: 244 mg/dL — ABNORMAL HIGH (ref 70–99)
Glucose-Capillary: 326 mg/dL — ABNORMAL HIGH (ref 70–99)
Glucose-Capillary: 300 mg/dL — ABNORMAL HIGH (ref 70–99)

## 2020-02-04 LAB — COMPREHENSIVE METABOLIC PANEL
ALT: 40 U/L (ref 0–44)
AST: 38 U/L (ref 15–41)
Albumin: 2.8 g/dL — ABNORMAL LOW (ref 3.5–5.0)
Alkaline Phosphatase: 72 U/L (ref 38–126)
Anion gap: 9 (ref 5–15)
BUN: 39 mg/dL — ABNORMAL HIGH (ref 8–23)
CO2: 29 mmol/L (ref 22–32)
Calcium: 8.8 mg/dL — ABNORMAL LOW (ref 8.9–10.3)
Chloride: 104 mmol/L (ref 98–111)
Creatinine, Ser: 0.82 mg/dL (ref 0.44–1.00)
GFR calc Af Amer: >60 mL/min (ref >60)
GFR calc non Af Amer: >60 mL/min (ref >60)
Glucose, Bld: 222 mg/dL — ABNORMAL HIGH (ref 70–99)
Potassium: 4.5 mmol/L (ref 3.5–5.1)
Sodium: 142 mmol/L (ref 135–145)
Total Bilirubin: 0.3 mg/dL (ref 0.3–1.2)
Total Protein: 6.4 g/dL — ABNORMAL LOW (ref 6.5–8.1)

## 2020-02-04 LAB — C-REACTIVE PROTEIN: CRP: 3.4 mg/dL — ABNORMAL HIGH (ref <1.0)

## 2020-02-04 LAB — D-DIMER, QUANTITATIVE: D-Dimer, Quant: 2.16 ug/mL-FEU — ABNORMAL HIGH (ref 0.00–0.50)

## 2020-02-04 MED ORDER — INSULIN GLARGINE 100 UNIT/ML ~~LOC~~ SOLN
8.0000 [IU] | Freq: Every day | SUBCUTANEOUS | Status: DC
  Administered 2020-02-04: 8 [IU] via SUBCUTANEOUS
  Filled 2020-02-04 (×2): qty 0.08

## 2020-02-04 NOTE — Progress Notes (Addendum)
Physical Therapy Treatment Patient Details Name: Kathy Thomas MRN: [id number] DOB: 1934/08/12 Today's Date: 02/04/2020    History of Present Illness 85 y.o. female with past stroke, dementia, diabetes, HTN, LBP, and osteoporosis presents with c/o COVID + and is currently being treated for covid PNA.    PT Comments    [name] is progressing well with PT.  Amb 40' with RW and min/guard assist today, maintaining SpO2 92-97% on 2l with activity, difficult to get a reading at times d/t poor perfusion.  Agree with Dr. Rito Ehrlich that she should be able to d/c home with HHPT as long as her dtr is comfortable with continuing care, have updated PT rec's to reflect this.  It would be beneficial for her to be in her familiar environment for continued PT Will continue to follow in acute setting.  Will need another trial on RA, may need to provide with earlobe probe to get an accurate reading on this pt.   Follow Up Recommendations  Home health PT;Supervision/Assistance - 24 hour     Equipment Recommendations  None recommended by PT    Recommendations for Other Services       Precautions / Restrictions Precautions Precautions: Fall Restrictions Weight Bearing Restrictions: No    Mobility  Bed Mobility Overal bed mobility: Needs Assistance Bed Mobility: Supine to Sit     Supine to sit: Supervision;HOB elevated     General bed mobility comments: incr time however no physical assist  Transfers Overall transfer level: Needs assistance Equipment used: Rolling walker (2 wheeled) Transfers: Sit to/from Stand Sit to Stand: Min guard         General transfer comment: cues for hand placement,min/guard for safety   Ambulation/Gait Ambulation/Gait assistance: Min guard Gait Distance (Feet): 75 Feet Assistive device: Rolling walker (2 wheeled) Gait Pattern/deviations: Step-through pattern;Decreased stride length;Trunk flexed     General Gait Details: cues for RW position and  overall safety, trunk extension   Stairs             Wheelchair Mobility    Modified Rankin (Stroke Patients Only)       Balance   Sitting-balance support: Feet supported;Single extremity supported Sitting balance-Leahy Scale: Fair Sitting balance - Comments: seated EOB   Standing balance support: During functional activity;Bilateral upper extremity supported Standing balance-Leahy Scale: Fair Standing balance comment: reliant on UE for support during dynamic tasks                             Cognition Arousal/Alertness: Awake/alert Behavior During Therapy: WFL for tasks assessed/performed Overall Cognitive Status: No family/caregiver present to determine baseline cognitive functioning                                 General Comments: hx of dementia, oriented to self, l,  unaware situation; follows one step commands, occasional redirection      Exercises General Exercises - Lower Extremity Ankle Circles/Pumps: AROM;10 reps;Both    General Comments General comments (skin integrity, edema, etc.): SpO2=92-97% on 2L [place] throughout session, minimal (1-2/4DOE) with amb x 75'. very poor perfusion, difficult to get reading at times      Pertinent Vitals/Pain Pain Assessment: No/denies pain    Home Living                      Prior Function  PT Goals (current goals can now be found in the care plan section) Acute Rehab PT Goals Patient Stated Goal: none stated PT Goal Formulation: With patient Time For Goal Achievement: 02/16/20 Potential to Achieve Goals: Good Progress towards PT goals: Progressing toward goals    Frequency    Min 3X/week      PT Plan Discharge plan needs to be updated;Frequency needs to be updated    Co-evaluation              AM-PAC PT "6 Clicks" Mobility   Outcome Measure  Help needed turning from your back to your side while in a flat bed without using bedrails?: None Help  needed moving from lying on your back to sitting on the side of a flat bed without using bedrails?: A Little Help needed moving to and from a bed to a chair (including a wheelchair)?: A Little Help needed standing up from a chair using your arms (e.g., wheelchair or bedside chair)?: A Little Help needed to walk in hospital room?: A Little Help needed climbing 3-5 steps with a railing? : A Lot 6 Click Score: 18    End of Session Equipment Utilized During Treatment: Gait belt Activity Tolerance: Patient tolerated treatment well Patient left: in chair;with call bell/phone within reach;with chair alarm set Nurse Communication: Mobility status PT Visit Diagnosis: Unsteadiness on feet (R26.81);Other abnormalities of gait and mobility (R26.89);Muscle weakness (generalized) (M62.81)     Time: 4599-7741 PT Time Calculation (min) (ACUTE ONLY): 23 min  Charges:  $Gait Training: 23-37 mins                     Delice Bison, PT  Acute Rehab Dept ([facility]) [phone] Pager [phone]  02/04/2020    [facility] 02/04/2020, 2:58 PM

## 2020-02-04 NOTE — Progress Notes (Signed)
PROGRESS NOTE  Kathy Thomas ZOX:[id number] DOB: 12-01-1934 DOA: 02/01/2020  PCP: [name], MD  Brief History/Interval Summary: 85 y.o. female with medical history significant of hypertension, hyperlipidemia, TIAs, diabetes mellitus type 2, anxiety, dementia, and recently tested positive for COVID-19 on 9/4 presented with weakness on 9/9.    Patient's daughter had been taking care of her at home monitoring her symptoms and oxygenation.  Had noted at times that her sats would drop into the low 80s, but improved after coughing.  Had called the emergency room on 9/8, but they discharged her home after giving her monoclonal antibodies.  However, after she got home, she was still quite weak and that her knees buckled and so EMS was called and she was brought back to the hospital.  The emergency room, noted to have oxygen saturations around 87% on room air which improved above 90% on 2 L nasal cannula.  Patient started on IV Remdisivir, oxygen and steroids.  Admitted to the hospitalist service.   Over the past few days, patient has been slowly improving, down to 1-2 L.  Mildly confused, but according to daughter, more alert than she had been when she first came in.  Patient herself with no major complaints.  Able to be weaned off of oxygen this morning, 9/12, however with ambulation, oxygen saturations dropped and put back on oxygen.    Consultants: None  Procedures: None  Antibiotics: Anti-infectives (From admission, onward)   Start     Dose/Rate Route Frequency Ordered Stop   02/02/20 1000  remdesivir 100 mg in sodium chloride 0.9 % 100 mL IVPB       "Followed by" Linked Group Details   100 mg 200 mL/hr over 30 Minutes Intravenous Daily 02/01/20 1051 02/06/20 0959   02/01/20 1200  remdesivir 200 mg in sodium chloride 0.9% 250 mL IVPB       "Followed by" Linked Group Details   200 mg 580 mL/hr over 30 Minutes Intravenous Once 02/01/20 1051 02/01/20 1316          Assessment/Plan:  Acute Hypoxic Resp. Failure/Pneumonia due to COVID-19   Recent Labs  Lab 01/31/20 1743 02/02/20 0424 02/03/20 0427 02/04/20 0437  DDIMER 1.77* 2.29* 2.67* 2.16*  FERRITIN 145 169  --   --   CRP 3.2* 9.6* 6.9* 3.4*  ALT 40  PROCALCITON <0.10  --   --   --     Objective findings: Fever: Low-grade fever noted Oxygen requirements: Patient currently on 1-2 L of oxygen by nasal cannula saturating in the early 90s, almost able to be fully weaned off.  COVID 19 Therapeutics: Antibacterials: None.  Procalcitonin was less than 0.1 Remdesivir: Day 4 Steroids: On Solu-Medrol Diuretics: None Actemra/Baricitinib: Not initiated yet PUD Prophylaxis: Will initiate Pepcid DVT Prophylaxis:  Lovenox  From a respiratory standpoint patient seems to be stable.  Requiring 1 to 2 L of oxygen currently.  Her D-dimer noted to be higher today at 2.29.  Continue to trend.  CRP peaked as high as 9.6 on 9/10, continues to trend downward.  Will follow.  Continue steroids and Remdesivir, which she completes on Monday.  Incentive spirometry mobilization.  PT and OT evaluation.  The treatment plan and use of medications and known side effects were discussed with patient/family. Some of the medications used are based on case reports/anecdotal data.  All other medications being used in the management of COVID-19 based on limited study data.  Complete risks and long-term side  effects are unknown, however in the best clinical judgment they seem to be of some benefit.  Patient/family wanted to proceed with treatment options provided.  Bacteremia 1 set of blood cultures growing gram-positive cocci in clusters.  Suspect contaminant.  White count is normal and patient is afebrile.  Not on any antibiotics.  Sinus bradycardia Thought to be due to with donepezil.  On hold currently.  Patient seems to be asymptomatic.  Prolonged QT interval Avoid QT prolonging medication.  Monitor  electrolytes.  Morbid obesity: Patient meets criteria for BMI greater than 35+ comorbidities of hypertension and diabetes  Diabetes mellitus type 2 HbA1c 6.2 in June.  Holding Actos.  SSI.  Essential hypertension Continue to monitor blood pressures.  Continue losartan.  History of dementia Holding donepezil due to bradycardia.  Thrombocytopenia Platelet count is low but stable.  Continue to monitor.  Obesity Estimated body mass index is 39.11 kg/m as calculated from the following:   Height as of this encounter: 5\' 1"  (1.549 m).   Weight as of this encounter: 93.9 kg.  DVT Prophylaxis: Lovenox Code Status: Full code Family Communication: Updated daughter by phone. Disposition Plan: Physical therapy recommending skilled nursing although patient's daughter has limited skilled nursing options due to Covid and patient's insurance.  May not end up sending her to skilled nursing may go home with daughter and home health   Status is: Inpatient  Remains inpatient appropriate because:IV treatments appropriate due to intensity of illness or inability to take PO and Inpatient level of care appropriate due to severity of illness   Dispo: The patient is from: Home              Anticipated d/c is to: Home versus skilled nursing              Anticipated d/c date is: 9/13 or 9/14              Patient currently is not medically stable to d/c.     Medications:  Scheduled:  albuterol  2 puff Inhalation Q6H   vitamin C  500 mg Oral Daily   aspirin EC  81 mg Oral Daily   atorvastatin  40 mg Oral Daily   calcium-vitamin D  1 tablet Oral Daily   enoxaparin (LOVENOX) injection  40 mg Subcutaneous Q24H   famotidine  20 mg Oral Daily   feeding supplement (GLUCERNA SHAKE)  237 mL Oral TID BM   insulin aspart  0-5 Units Subcutaneous QHS   insulin aspart  0-9 Units Subcutaneous TID WC   insulin glargine  5 Units Subcutaneous QHS   losartan  100 mg Oral Daily   predniSONE  50 mg  Oral Daily   sodium chloride flush  3 mL Intravenous Q12H   cyanocobalamin  1,000 mcg Oral Daily   zinc sulfate  220 mg Oral Daily   Continuous:  remdesivir 100 mg in NS 100 mL 100 mg (02/04/20 0838)   04/05/20, guaiFENesin-dextromethorphan   Objective:  Vital Signs  Vitals:   02/03/20 1413 02/03/20 2100 02/04/20 0531 02/04/20 1402  BP: 110/65 (!) 143/59 (!) 156/59 (!) 122/49  Pulse: (!) 58 (!) 55 (!) 52 (!) 58  Resp: 20 18 18 20   Temp: 97.8 F (36.6 C) 98.4 F (36.9 C) 97.9 F (36.6 C) 97.9 F (36.6 C)  TempSrc: Oral   Oral  SpO2: 99% 96% 94% 98%  Weight:      Height:        Intake/Output Summary (Last 24  hours) at 02/04/2020 1416 Last data filed at 02/04/2020 7681 Gross per 24 hour  Intake 0 ml  Output 750 ml  Net -750 ml   Filed Weights   02/01/20 0234  Weight: 93.9 kg    General appearance: Alert and oriented x2, no acute distress HEENT: Normocephalic and atraumatic, mucous membranes are moist Resp:  clear to auscultation bilaterally Cardio: Regular rhythm, S1-S2, borderline bradycardia GI: Soft, nontender, nondistended, positive bowel sounds Extremities: No clubbing or cyanosis or edema Psychiatry: Underlying chronic dementia, but no evidence of acute psychosis Neurologic:  No focal neurological deficits.    Lab Results:  Data Reviewed: I have personally reviewed following labs and imaging studies  CBC: Recent Labs  Lab 01/31/20 1808 02/01/20 1047 02/02/20 0424 02/03/20 0427 02/04/20 0437  WBC 2.9* 5.4 4.1 11.5* 12.1*  NEUTROABS 1.5* 4.1 3.4 10.1* 11.0*  HGB 13.2 13.0 13.4 13.7 13.2  HCT 42.1 41.8 43.5 43.4 42.0  MCV 97.2 97.2 97.5 96.2 96.6  PLT 93* 102* 110* 153 185    Basic Metabolic Panel: Recent Labs  Lab 01/31/20 1743 02/01/20 1047 02/02/20 0424 02/03/20 0427 02/04/20 0437  NA 138 138 139 141 142  K 4.0 4.2 4.1 4.7 4.5  CL 101 101 104 103 104  CO2 28 29 25 28 29   GLUCOSE 93 154* 216* 222* 222*  BUN 29* 23 26*  40* 39*  CREATININE 1.08* 0.90 0.80 0.87 0.82  CALCIUM 8.3* 8.1* 8.5* 8.8* 8.8*  MG  --   --  2.0  --   --   PHOS  --   --  2.7  --   --     GFR: Estimated Creatinine Clearance: 55.3 mL/min (by C-G formula based on SCr of 0.82 mg/dL).  Liver Function Tests: Recent Labs  Lab 01/31/20 1743 02/02/20 0424 02/03/20 0427 02/04/20 0437  AST 27 25 28  38  ALT 22 19 22  40  ALKPHOS 58 50 68 72  BILITOT 0.8 0.4 0.5 0.3  PROT 6.6 6.1* 6.6 6.4*  ALBUMIN 3.1* 2.8* 2.9* 2.8*     CBG: Recent Labs  Lab 02/03/20 1124 02/03/20 1637 02/03/20 2102 02/04/20 0733 02/04/20 1132  GLUCAP 225* 354* 372* 205* 244*    Lipid Profile: No results for input(s): CHOL, HDL, LDLCALC, TRIG, CHOLHDL, LDLDIRECT in the last 72 hours.  Anemia Panel: Recent Labs    02/02/20 0424  FERRITIN 169    Recent Results (from the past 240 hour(s))  SARS Coronavirus 2 by RT PCR (hospital order, performed in [facility] hospital lab) Nasopharyngeal Nasopharyngeal Swab     Status: Abnormal   Collection Time: 01/31/20  5:43 PM   Specimen: Nasopharyngeal Swab  Result Value Ref Range Status   SARS Coronavirus 2 POSITIVE (A) NEGATIVE Final    Comment: RESULT CALLED TO, READ BACK BY AND VERIFIED WITH: SMITH,J. RN @1925  01/31/20 BILLINGSLEY,L (NOTE) SARS-CoV-2 target nucleic acids are DETECTED  SARS-CoV-2 RNA is generally detectable in upper respiratory specimens  during the acute phase of infection.  Positive results are indicative  of the presence of the identified virus, but do not rule out bacterial infection or co-infection with other pathogens not detected by the test.  Clinical correlation with patient history and  other diagnostic information is necessary to determine patient infection status.  The expected result is negative.  Fact Sheet for Patients:   [facility]   Fact Sheet for Healthcare Providers:   04/01/20    This test is not yet  approved or cleared by  the [employer] [place] FDA and  has been authorized for detection and/or diagnosis of SARS-CoV-2 by FDA under an Emergency Use Authorization (EUA).  This EUA will remain in effect (meaning thi s test can be used) for the duration of  the COVID-19 declaration under Section 564(b)(1) of the Act, 21 U.S.C. section 360-bbb-3(b)(1), unless the authorization is terminated or revoked sooner.  Performed at [facility], [address] [place], [place] [address]   Blood Culture (routine x 2)     Status: Abnormal   Collection Time: 01/31/20  5:43 PM   Specimen: BLOOD  Result Value Ref Range Status   Specimen Description   Final    BLOOD RIGHT ANTECUBITAL Performed at [facility], [address] [place], [place] [address]    Special Requests   Final    BOTTLES DRAWN AEROBIC AND ANAEROBIC Blood Culture adequate volume Performed at [facility], [address] [place], [place] [address]    Culture  Setup Time   Final    GRAM POSITIVE COCCI IN CLUSTERS ANAEROBIC BOTTLE ONLY Organism ID to follow CRITICAL RESULT CALLED TO, READ BACK BY AND VERIFIED WITH: Maximino Greenland. Green PharmD 16:00 02/01/20 ([email or web address])    Culture (A)  Final    STAPHYLOCOCCUS CAPITIS STAPHYLOCOCCUS HOMINIS THE SIGNIFICANCE OF ISOLATING THIS ORGANISM FROM A SINGLE SET OF BLOOD CULTURES WHEN MULTIPLE SETS ARE DRAWN IS UNCERTAIN. PLEASE NOTIFY THE MICROBIOLOGY DEPARTMENT WITHIN ONE WEEK IF SPECIATION AND SENSITIVITIES ARE REQUIRED. Performed at [facility], [address] [place], [place] [address]    Report Status 02/04/2020 FINAL  Final  Blood Culture (routine x 2)     Status: None (Preliminary result)   Collection Time: 01/31/20  5:43 PM   Specimen: BLOOD  Result Value Ref Range Status   Specimen Description   Final    BLOOD RIGHT ANTECUBITAL Performed at [facility], [address] [place], [place] [address]    Special  Requests   Final    BOTTLES DRAWN AEROBIC AND ANAEROBIC Blood Culture results may not be optimal due to an excessive volume of blood received in culture bottles Performed at [facility], [address] [place], [place] [address]    Culture   Final    NO GROWTH 3 DAYS Performed at [facility], [address] [place], [place] [address]    Report Status PENDING  Incomplete  Blood Culture ID Panel (Reflexed)     Status: Abnormal   Collection Time: 01/31/20  5:43 PM  Result Value Ref Range Status   Enterococcus faecalis NOT DETECTED NOT DETECTED Final   Enterococcus Faecium NOT DETECTED NOT DETECTED Final   Listeria monocytogenes NOT DETECTED NOT DETECTED Final   Staphylococcus species DETECTED (A) NOT DETECTED Final    Comment: CRITICAL RESULT CALLED TO, READ BACK BY AND VERIFIED WITH: Maximino Greenland. Green PharmD 16:00 02/01/20 ([email or web address])    Staphylococcus aureus (BCID) NOT DETECTED NOT DETECTED Final   Staphylococcus epidermidis NOT DETECTED NOT DETECTED Final   Staphylococcus lugdunensis NOT DETECTED NOT DETECTED Final   Streptococcus species NOT DETECTED NOT DETECTED Final   Streptococcus agalactiae NOT DETECTED NOT DETECTED Final   Streptococcus pneumoniae NOT DETECTED NOT DETECTED Final   Streptococcus pyogenes NOT DETECTED NOT DETECTED Final   A.calcoaceticus-baumannii NOT DETECTED NOT DETECTED Final   Bacteroides fragilis NOT DETECTED NOT DETECTED Final   Enterobacterales NOT DETECTED NOT DETECTED Final   Enterobacter cloacae complex NOT DETECTED NOT DETECTED  Final   Escherichia coli NOT DETECTED NOT DETECTED Final   Klebsiella aerogenes NOT DETECTED NOT DETECTED Final   Klebsiella oxytoca NOT DETECTED NOT DETECTED Final   Klebsiella pneumoniae NOT DETECTED NOT DETECTED Final   Proteus species NOT DETECTED NOT DETECTED Final   Salmonella species NOT DETECTED NOT DETECTED Final   Serratia marcescens NOT DETECTED NOT DETECTED Final   Haemophilus influenzae NOT  DETECTED NOT DETECTED Final   Neisseria meningitidis NOT DETECTED NOT DETECTED Final   Pseudomonas aeruginosa NOT DETECTED NOT DETECTED Final   Stenotrophomonas maltophilia NOT DETECTED NOT DETECTED Final   Candida albicans NOT DETECTED NOT DETECTED Final   Candida auris NOT DETECTED NOT DETECTED Final   Candida glabrata NOT DETECTED NOT DETECTED Final   Candida krusei NOT DETECTED NOT DETECTED Final   Candida parapsilosis NOT DETECTED NOT DETECTED Final   Candida tropicalis NOT DETECTED NOT DETECTED Final   Cryptococcus neoformans/gattii NOT DETECTED NOT DETECTED Final    Comment: Performed at [facility], [address], [place], [place] [address]      Radiology Studies: No results found.     LOS: 3 days   Klani Caridi Mordecai Rasmussen  [facility] Pager on [email or web address]  02/04/2020, 2:16 PM

## 2020-02-04 NOTE — TOC Progression Note (Signed)
Transition of Care [facility]) - Progression Note    Patient Details  Name: Kathy Thomas MRN: [id number] Date of Birth: 08-29-1934  Transition of Care [facility]) CM/SW Contact  Gustavus Bryant, [place] Phone Number: 02/04/2020, 12:55 PM  Clinical Narrative:   LCSW completed call to patient's daughter on 02/04/20 to coordinate care and discuss discharge plans. Daughter is adamant that patient not go to [facility] as they only have 1 star rating. Daughter shares that she wishes to wait until tomorrow to see if any other SNF's will accept patient as she has spoke to several surrounding SNF facilities reports that most of the facilities that she spoke with stated that their admissions director was not there on the weekends. Daughter reports that if patient has to chose between discharging home and going to [facility] then family would choose [facility] but prefer SNF placement elsewhere. Daughter ask that LCSW fax referral to [facility] as she heard facility is accepting COVID positive patients. Daughter is agreeable to considering SNF placement for patient in other counties of [place] now. LCSW resent SNF referral to SNF's in [place], [place], [place], [place] and [place] counties through [employer]. LCSW physically faxed referral to [facility] as well since facility was not in [employer]. LCSW updated attending physician as well. LCSW spent 30 minutes providing education and completing care coordination with daughter today.   Expected Discharge Plan: Skilled Nursing Facility Barriers to Discharge: SNF Pending bed offer  Expected Discharge Plan and Services Expected Discharge Plan: Skilled Nursing Facility   Discharge Planning Services: CM Consult   Living arrangements for the past 2 months: Single Family Home                     Readmission Risk Interventions No flowsheet data found.  Dickie La, BSW, MSW, LCSW [email or web address]

## 2020-02-05 ENCOUNTER — Telehealth: Payer: Self-pay | Admitting: Emergency Medicine

## 2020-02-05 DIAGNOSIS — R279 Unspecified lack of coordination: Secondary | ICD-10-CM | POA: Diagnosis not present

## 2020-02-05 DIAGNOSIS — E118 Type 2 diabetes mellitus with unspecified complications: Secondary | ICD-10-CM | POA: Diagnosis not present

## 2020-02-05 DIAGNOSIS — R5381 Other malaise: Secondary | ICD-10-CM | POA: Diagnosis not present

## 2020-02-05 DIAGNOSIS — B9732 Oncovirus as the cause of diseases classified elsewhere: Secondary | ICD-10-CM | POA: Diagnosis not present

## 2020-02-05 DIAGNOSIS — R9431 Abnormal electrocardiogram [ECG] [EKG]: Secondary | ICD-10-CM | POA: Diagnosis not present

## 2020-02-05 DIAGNOSIS — F0390 Unspecified dementia without behavioral disturbance: Secondary | ICD-10-CM | POA: Diagnosis not present

## 2020-02-05 DIAGNOSIS — R2689 Other abnormalities of gait and mobility: Secondary | ICD-10-CM | POA: Diagnosis not present

## 2020-02-05 DIAGNOSIS — U071 COVID-19: Secondary | ICD-10-CM | POA: Diagnosis not present

## 2020-02-05 DIAGNOSIS — Z743 Need for continuous supervision: Secondary | ICD-10-CM | POA: Diagnosis not present

## 2020-02-05 DIAGNOSIS — J9691 Respiratory failure, unspecified with hypoxia: Secondary | ICD-10-CM | POA: Diagnosis not present

## 2020-02-05 DIAGNOSIS — M6281 Muscle weakness (generalized): Secondary | ICD-10-CM | POA: Diagnosis not present

## 2020-02-05 DIAGNOSIS — R1312 Dysphagia, oropharyngeal phase: Secondary | ICD-10-CM | POA: Diagnosis not present

## 2020-02-05 DIAGNOSIS — J1282 Pneumonia due to coronavirus disease 2019: Secondary | ICD-10-CM | POA: Diagnosis not present

## 2020-02-05 DIAGNOSIS — R498 Other voice and resonance disorders: Secondary | ICD-10-CM | POA: Diagnosis not present

## 2020-02-05 DIAGNOSIS — R41841 Cognitive communication deficit: Secondary | ICD-10-CM | POA: Diagnosis not present

## 2020-02-05 DIAGNOSIS — M6259 Muscle wasting and atrophy, not elsewhere classified, multiple sites: Secondary | ICD-10-CM | POA: Diagnosis not present

## 2020-02-05 DIAGNOSIS — R2681 Unsteadiness on feet: Secondary | ICD-10-CM | POA: Diagnosis not present

## 2020-02-05 LAB — COMPREHENSIVE METABOLIC PANEL
ALT: 50 U/L — ABNORMAL HIGH (ref 0–44)
AST: 35 U/L (ref 15–41)
Albumin: 2.7 g/dL — ABNORMAL LOW (ref 3.5–5.0)
Alkaline Phosphatase: 66 U/L (ref 38–126)
Anion gap: 8 (ref 5–15)
BUN: 39 mg/dL — ABNORMAL HIGH (ref 8–23)
CO2: 31 mmol/L (ref 22–32)
Calcium: 8.7 mg/dL — ABNORMAL LOW (ref 8.9–10.3)
Chloride: 100 mmol/L (ref 98–111)
Creatinine, Ser: 0.85 mg/dL (ref 0.44–1.00)
GFR calc Af Amer: >60 mL/min (ref >60)
GFR calc non Af Amer: >60 mL/min (ref >60)
Glucose, Bld: 194 mg/dL — ABNORMAL HIGH (ref 70–99)
Potassium: 4.4 mmol/L (ref 3.5–5.1)
Sodium: 139 mmol/L (ref 135–145)
Total Bilirubin: 0.3 mg/dL (ref 0.3–1.2)
Total Protein: 6 g/dL — ABNORMAL LOW (ref 6.5–8.1)

## 2020-02-05 LAB — C-REACTIVE PROTEIN: CRP: 1.8 mg/dL — ABNORMAL HIGH (ref <1.0)

## 2020-02-05 LAB — CBC WITH DIFFERENTIAL/PLATELET
Abs Immature Granulocytes: 0.11 10*3/uL — ABNORMAL HIGH (ref 0.00–0.07)
Basophils Absolute: 0 10*3/uL (ref 0.0–0.1)
Basophils Relative: 0 %
Eosinophils Absolute: 0 10*3/uL (ref 0.0–0.5)
Eosinophils Relative: 0 %
HCT: 43.2 % (ref 36.0–46.0)
Hemoglobin: 13.5 g/dL (ref 12.0–15.0)
Immature Granulocytes: 1 %
Lymphocytes Relative: 7 %
Lymphs Abs: 0.7 10*3/uL (ref 0.7–4.0)
MCH: 30.1 pg (ref 26.0–34.0)
MCHC: 31.3 g/dL (ref 30.0–36.0)
MCV: 96.4 fL (ref 80.0–100.0)
Monocytes Absolute: 0.9 10*3/uL (ref 0.1–1.0)
Monocytes Relative: 10 %
Neutro Abs: 8 10*3/uL — ABNORMAL HIGH (ref 1.7–7.7)
Neutrophils Relative %: 82 %
Platelets: 187 10*3/uL (ref 150–400)
RBC: 4.48 MIL/uL (ref 3.87–5.11)
RDW: 12.3 % (ref 11.5–15.5)
WBC: 9.7 10*3/uL (ref 4.0–10.5)
nRBC: 0 % (ref 0.0–0.2)

## 2020-02-05 LAB — D-DIMER, QUANTITATIVE: D-Dimer, Quant: 2.09 ug/mL-FEU — ABNORMAL HIGH (ref 0.00–0.50)

## 2020-02-05 LAB — GLUCOSE, CAPILLARY
Glucose-Capillary: 137 mg/dL — ABNORMAL HIGH (ref 70–99)
Glucose-Capillary: 131 mg/dL — ABNORMAL HIGH (ref 70–99)

## 2020-02-05 MED ORDER — LOSARTAN POTASSIUM 100 MG PO TABS: 100.0000 mg | ORAL_TABLET | Freq: Every day | ORAL | 0 refills | Status: DC

## 2020-02-05 MED ORDER — ATORVASTATIN CALCIUM 40 MG PO TABS: 40.0000 mg | ORAL_TABLET | Freq: Every day | ORAL | 0 refills | Status: DC

## 2020-02-05 MED ORDER — PIOGLITAZONE HCL 30 MG PO TABS: 30.0000 mg | ORAL_TABLET | Freq: Every morning | ORAL | 0 refills | Status: DC

## 2020-02-05 MED ORDER — ALBUTEROL SULFATE HFA 108 (90 BASE) MCG/ACT IN AERS: 2.0000 | INHALATION_SPRAY | Freq: Four times a day (QID) | RESPIRATORY_TRACT | 0 refills | Status: DC | PRN

## 2020-02-05 MED ORDER — DICYCLOMINE HCL 20 MG PO TABS: 20.0000 mg | ORAL_TABLET | Freq: Two times a day (BID) | ORAL | 0 refills | Status: DC

## 2020-02-05 MED ORDER — FAMOTIDINE 20 MG PO TABS: 20.0000 mg | ORAL_TABLET | Freq: Every day | ORAL | 0 refills | Status: DC

## 2020-02-05 MED ORDER — ASPIRIN EC 81 MG PO TBEC: 81.0000 mg | DELAYED_RELEASE_TABLET | Freq: Every day | ORAL | 0 refills | Status: AC

## 2020-02-05 MED ORDER — DONEPEZIL HCL 10 MG PO TABS: 10.0000 mg | ORAL_TABLET | Freq: Every day | ORAL | 0 refills | Status: DC

## 2020-02-05 MED ORDER — PREDNISONE 10 MG PO TABS: ORAL_TABLET | ORAL | 0 refills | Status: AC

## 2020-02-05 MED ORDER — ASCORBIC ACID 500 MG PO TABS: 500.0000 mg | ORAL_TABLET | Freq: Every day | ORAL | 0 refills | Status: AC

## 2020-02-05 MED ORDER — ALENDRONATE SODIUM 70 MG PO TABS: ORAL_TABLET | ORAL | 0 refills | Status: DC

## 2020-02-05 NOTE — Care Management Important Message (Signed)
Important Message  Patient Details IM Letter given to the Patient Name: Kathy Thomas MRN: [id number] Date of Birth: [birth year]   Medicare Important Message Given:  Yes     Caren Macadam 02/05/2020, 10:38 AM

## 2020-02-05 NOTE — Progress Notes (Signed)
Report called to [facility], Reuel Boom RN received report. Report given to [facility]. Pt d/c'd to SNF via [facility] transport stretcher. All belongings sent with pt. Dtr, [name] notified of transfer.

## 2020-02-05 NOTE — Progress Notes (Signed)
Physical Therapy Treatment Patient Details Name: Kathy Thomas MRN: [id number] DOB: [birth year] Today's Date: 02/05/2020    History of Present Illness 85 y.o. female with past stroke, dementia, diabetes, HTN, LBP, and osteoporosis presents with c/o COVID + and is currently being treated for covid PNA.    PT Comments    The patient is pleasantly confused, requiring more assistance and redirection today. Patient assisted to [facility] as patient was not steady standing to ambulate. Patient I continent of urine upon standing. Assisted back into bed and replaced a purewick.  SPo2 on RA at rest 95%, while mobilizing, pleth erratic, intermittent readings of 87%. Replaced back on 2 L. Erratic readings, final;ly reading 93%. Patient demonstrates inconsistent performance and requires varying levels of assistance and can benefit from SNF for rehab to improve safety and fu nctional Independence.  Follow Up Recommendations  SNF     Equipment Recommendations  None recommended by PT    Recommendations for Other Services       Precautions / Restrictions Precautions Precautions: Fall Precaution Comments: monitor sats    Mobility  Bed Mobility   Bed Mobility: Supine to Sit;Sit to Supine     Supine to sit: Min assist Sit to supine: Min assist   General bed mobility comments: increased time, more cues and delay.  Transfers Overall transfer level: Needs assistance   Transfers: Sit to/from Stand;Stand Pivot Transfers Sit to Stand: Mod assist Stand pivot transfers: Mod assist       General transfer comment: several attempts to stand from bed, pllpped back onto the bed x 2. Mod assist to transfer to [facility], incontinent of urine upon standing up. Stand pivot Transfer back to bed with mod steady assistance.  Ambulation/Gait             General Gait Details: unable to progreess ambulation, required mod asistance and incontinent.   Stairs             Wheelchair Mobility     Modified Rankin (Stroke Patients Only)       Balance Overall balance assessment: Needs assistance Sitting-balance support: Feet supported;Single extremity supported Sitting balance-Leahy Scale: Fair     Standing balance support: During functional activity;Bilateral upper extremity supported Standing balance-Leahy Scale: Poor                              Cognition Arousal/Alertness: Awake/alert Behavior During Therapy: WFL for tasks assessed/performed Overall Cognitive Status: No family/caregiver present to determine baseline cognitive functioning, requiring increased cues, redirection.                                 General Comments: hx of dementia, oriented to self,   unaware situation; follows one step commands, occasional redirection      Exercises      General Comments        Pertinent Vitals/Pain Pain Assessment: No/denies pain    Home Living                      Prior Function            PT Goals (current goals can now be found in the care plan section) Progress towards PT goals: Progressing toward goals    Frequency    Min 2X/week      PT Plan Discharge plan needs to be updated;Frequency needs to be updated  Co-evaluation              AM-PAC PT "6 Clicks" Mobility   Outcome Measure  Help needed turning from your back to your side while in a flat bed without using bedrails?: A Little Help needed moving from lying on your back to sitting on the side of a flat bed without using bedrails?: A Little Help needed moving to and from a bed to a chair (including a wheelchair)?: A Lot Help needed standing up from a chair using your arms (e.g., wheelchair or bedside chair)?: A Lot Help needed to walk in hospital room?: A Lot Help needed climbing 3-5 steps with a railing? : A Lot 6 Click Score: 14    End of Session Equipment Utilized During Treatment: Gait belt Activity Tolerance: Patient tolerated treatment  well Patient left: in bed;with call bell/phone within reach;with bed alarm set Nurse Communication: Mobility status PT Visit Diagnosis: Unsteadiness on feet (R26.81);Other abnormalities of gait and mobility (R26.89);Muscle weakness (generalized) (M62.81)     Time: 1330-1400 PT Time Calculation (min) (ACUTE ONLY): 30 min  Charges:  $Therapeutic Activity: 8-22 mins $Self Care/Home Management: 8-22                     Blanchard Kelch PT Acute Rehabilitation Services Pager [phone] Office [phone]    Rada Hay 02/05/2020, 2:07 PM

## 2020-02-05 NOTE — Progress Notes (Signed)
SATURATION QUALIFICATIONS: (This note is used to comply with regulatory documentation for home oxygen)  Patient Saturations on Room Air at Rest = 90%  Patient Saturations on Room Air while Ambulating = 76%  Patient Saturations on 2 Liters of oxygen while Ambulating = 91%  Please briefly explain why patient needs home oxygen: Pt could not tolerate activity without oxygen. Desat to 76% on RA. O2 sat 91% with 2L with activity.

## 2020-02-05 NOTE — Discharge Summary (Signed)
Physician Discharge Summary  SAPRINA CHUONG KTG:[id number] DOB: 08-30-1934 DOA: 02/01/2020  PCP: Biagio Borg, MD  Admit date: 02/01/2020 Discharge date: 02/05/2020  Admitted From: Home Disposition: [facility]  Recommendations for Outpatient Follow-up:  1. Follow up with PCP vs physician at facility within 1 week 2. Continue prednisone taper on discharge for Covid-19 viral pneumonia 3. Continue supplemental oxygen 2 L per nasal cannula especially with ambulation/exertion 4. Continue to closely monitor oxygen status   Discharge Condition: Stable CODE STATUS: Full code Diet recommendation: Heart healthy/consistent carbohydrate diet  History of present illness: Kathy Thomas is a 85 year old female with past medical history notable for hypertension, hyperlipidemia, TIAs, type 2 diabetes mellitus, anxiety, dementia and recently tested positive for Covid-19 on 01/27/2020 who presented to the ED with weakness on 02/01/2020.  Patient's daughter who has been taking care of her mother at home noted that at times her oxygen saturation will drop into the low 80s but improved with coughing.  Patient initially presented to the ED on 01/31/2020, but discharged home after receiving monoclonal antibodies.  Upon arrival back home, she continued to have progressive weakness to the extent that her knees buckled and EMS was called once again and brought back to the hospital.  While in the ED she was noted to have oxygen saturation around 87% on room air which improved to above 90% on 2 L nasal cannula.  Patient was started on IV remdesivir, oxygen and steroids and admitted to the hospital service.   Hospital course:  Acute hypoxic respiratory failure secondary to acute Covid-19 viral pneumonia during the ongoing 2020 Covid 19 Pandemic - POA Patient presenting to the ED with progressive weakness, hypoxia with notable oxygen saturations in the low 80s at home on room air.  Covid-19/SARS-CoV-2 PCR positive on  01/31/2020.  CT angiogram chest negative for acute pulmonary embolism but did note bilateral groundglass opacities greater in the bilateral lower lobes consistent with multifocal/Covid-19 viral pneumonia.  Patient completed 5-day course of remdesivir while inpatient.  Patient was initially started on IV steroids which was titrated down given downtrending inflamm Dr. [place] Pink thank you in talking atory markers and transition to prednisone in which she will continue a taper on discharge.  Oxygen desaturation screen was performed on day of discharge that noted patient oxygenating well on room air at rest, but did require 2 L nasal cannula with exertion.  The treatment plan and use of medications and known side effects were discussed with patient/family. Some of the medications used are based on case reports/anecdotal data.  All other medications being used in the management of COVID-19 based on limited study data.  Complete risks and long-term side effects are unknown, however in the best clinical judgment they seem to be of some benefit.  Patient wanted to proceed with treatment options provided.  Staph capitis/hominis bacteremia Patient with 1 out of 4 blood cultures positive for Staphylococcus capitis/hominis.  Patient is afebrile without leukocytosis.  Etiology likely secondary to skin contamination.  No antibiotic therapy were warranted during hospitalization.   Prolonged QTC interval Avoid QT prolonging medications.  Morbid obesity Body mass index is 39.11 kg/m.  Patient would benefit from weight loss/lifestyle changes as this complicates all facets of care.  Follow-up with PCP.  Type 2 diabetes mellitus Hemoglobin A1c 6.25 October 2019.  Continue home pioglitazone.  Essential hypertension Continue home losartan 100 mg p.o. daily  Dementia Continue home donepezil  Thrombocytopenia Platelet count stable, 187 at time of discharge.  Discharge Diagnoses:  Principal Problem:   Pneumonia due to  COVID-19 virus Active Problems:   DM type 2 (diabetes mellitus, type 2) ([place])   Hyperlipidemia   Essential hypertension   Dementia without behavioral disturbance (HCC)   Generalized weakness   Acute respiratory failure with hypoxia (HCC)   Protein calorie malnutrition (HCC)   Thrombocytopenia (HCC)    Discharge Instructions  Discharge Instructions    Diet - low sodium heart healthy   Complete by: As directed    Increase activity slowly   Complete by: As directed      Allergies as of 02/05/2020      Reactions   Codeine Hives   Penicillins    REACTION: rash   Morphine Rash   REACTION: pt unsure of reaction      Medication List    STOP taking these medications   diphenoxylate-atropine 2.5-0.025 MG tablet Commonly known as: Lomotil   NONFORMULARY OR COMPOUNDED ITEM     TAKE these medications   acetaminophen 500 MG tablet Commonly known as: TYLENOL Take 1,000 mg by mouth every 6 (six) hours as needed for mild pain or headache.   albuterol 108 (90 Base) MCG/ACT inhaler Commonly known as: VENTOLIN HFA Inhale 2 puffs into the lungs every 6 (six) hours as needed for wheezing or shortness of breath.   alendronate 70 MG tablet Commonly known as: FOSAMAX Take with a full glass of water on an empty stomach.   ascorbic acid 500 MG tablet Commonly known as: VITAMIN C Take 1 tablet (500 mg total) by mouth daily. Start taking on: February 06, 2020 What changed:   medication strength  how much to take   aspirin EC 81 MG tablet Take 1 tablet (81 mg total) by mouth daily.   atorvastatin 40 MG tablet Commonly known as: LIPITOR Take 1 tablet (40 mg total) by mouth daily. What changed: See the new instructions.   B COMPLEX PO Take 1 tablet by mouth daily.   cyanocobalamin 1000 MCG tablet Take 1 tablet (1,000 mcg total) by mouth daily.   dicyclomine 20 MG tablet Commonly known as: BENTYL Take 1 tablet (20 mg total) by mouth 2 (two) times daily.   donepezil 10  MG tablet Commonly known as: ARICEPT Take 1 tablet (10 mg total) by mouth daily.   famotidine 20 MG tablet Commonly known as: PEPCID Take 1 tablet (20 mg total) by mouth daily. Start taking on: February 06, 2020   FIBER PO Take 1 tablet by mouth daily.   glucose blood test strip Commonly known as: ONE TOUCH ULTRA TEST Use as instructed twice per day E11.9   guaiFENesin 600 MG 12 hr tablet Commonly known as: MUCINEX Take 600 mg by mouth 2 (two) times daily as needed for cough.   Lancets Misc Use as directed twice per day E11.9   losartan 100 MG tablet Commonly known as: COZAAR Take 1 tablet (100 mg total) by mouth daily. Annual appt due in Sept must see provider for future refills   ONE TOUCH ULTRA 2 w/Device Kit Use as directed twice per day E11.9   pioglitazone 30 MG tablet Commonly known as: ACTOS Take 1 tablet (30 mg total) by mouth every morning. What changed: when to take this   predniSONE 10 MG tablet Commonly known as: DELTASONE Take 4 tablets (40 mg total) by mouth daily for 2 days, THEN 3 tablets (30 mg total) daily for 2 days, THEN 2 tablets (20 mg total) daily for 2 days,  THEN 1 tablet (10 mg total) daily for 2 days. Start taking on: February 05, 2020   PROBIOTIC PO Take 1 capsule by mouth daily.   VITAMIN D3 PO Take 1 capsule by mouth daily.   ZINC PO Take 1 tablet by mouth daily.       Contact information for follow-up providers    Biagio Borg, MD. Schedule an appointment as soon as possible for a visit in 1 week(s).   Specialties: Internal Medicine, Radiology Contact information: [place] [address] [phone]            Contact information for after-discharge care    Destination    [facility] .   Service: Skilled Nursing Contact information: [name] [address] [phone]                 Allergies  Allergen Reactions  . Codeine Hives  .  Penicillins     REACTION: rash  . Morphine Rash    REACTION: pt unsure of reaction    Consultations:  None   Procedures/Studies: CT ANGIO CHEST PE W OR WO CONTRAST  Result Date: 02/01/2020 CLINICAL DATA:  Shortness of breath.  COVID positive. EXAM: CT ANGIOGRAPHY CHEST WITH CONTRAST TECHNIQUE: Multidetector CT imaging of the chest was performed using the standard protocol during bolus administration of intravenous contrast. Multiplanar CT image reconstructions and MIPs were obtained to evaluate the vascular anatomy. CONTRAST:  128m OMNIPAQUE IOHEXOL 350 MG/ML SOLN COMPARISON:  None. FINDINGS: Cardiovascular: There is no acute pulmonary embolism. The heart size is significantly enlarged. There is no significant pericardial effusion. There are coronary artery calcifications. There are atherosclerotic changes of the thoracic aorta without evidence for an aneurysm. Mediastinum/Nodes: -- No mediastinal lymphadenopathy. -- No hilar lymphadenopathy. -- No axillary lymphadenopathy. -- No supraclavicular lymphadenopathy. -- Normal thyroid gland where visualized. -  Unremarkable esophagus. Lungs/Pleura: There are ground-glass airspace opacities bilaterally, greatest within the bilateral lower lobes. There is atelectasis in the dependent portions of the bilateral lower lobes. There is no pneumothorax. The trachea is unremarkable. Upper Abdomen: Contrast bolus timing is not optimized for evaluation of the abdominal organs. The visualized portions of the organs of the upper abdomen are normal. Musculoskeletal: No chest wall abnormality. No bony spinal canal stenosis. Review of the MIP images confirms the above findings. IMPRESSION: 1. No evidence for acute pulmonary embolism. 2. Bilateral ground-glass airspace opacities, greatest within the bilateral lower lobes, which can be seen in patients with COVID-19 pneumonia. 3. Cardiomegaly with coronary artery disease. Aortic Atherosclerosis (ICD10-I70.0). Electronically  Signed   By: CConstance HolsterM.D.   On: 02/01/2020 15:20   DG Chest Port 1 View  Result Date: 01/31/2020 CLINICAL DATA:  COVID-19 positive 5 days ago, hypoxia EXAM: PORTABLE CHEST 1 VIEW COMPARISON:  06/25/2016 FINDINGS: Single frontal view of the chest demonstrates an unremarkable cardiac silhouette. Diffuse interstitial prominence unchanged. No airspace disease, effusion, or pneumothorax. No acute bony abnormalities. IMPRESSION: 1. No acute intrathoracic process. Electronically Signed   By: MRanda NgoM.D.   On: 01/31/2020 17:27      Subjective: Patient seen and examined at bedside, resting comfortably in bed. On room air at rest, but [facility]. Ready for dischatge to rehab today. Updated, patients daughter by phone. Patient denies headache, no visual changes, no chest pain, no palpations, no abdominal pain. No acute events overnight per nursing staff.   Discharge Exam: Vitals:   02/05/20 0910 02/05/20 1324  BP: (!) 142/57 (!) 131/47  Pulse: (!) 48 (!) 55  Resp: 20 12  Temp: 98.1 F (36.7 C) 98.1 F (36.7 C)  SpO2: 95%    Vitals:   02/05/20 0800 02/05/20 0900 02/05/20 0910 02/05/20 1324  BP:   (!) 142/57 (!) 131/47  Pulse:   (!) 48 (!) 55  Resp: _0 Temp:   98.1 F (36.7 C) 98.1 F (36.7 C)  TempSrc:   Oral   SpO2:   95%   Weight:      Height:        General: Pt is alert, awake, not in acute distress Cardiovascular: RRR, S1/S2 +, no rubs, no gallops Respiratory: CTA bilaterally, no wheezing, no rhonchi, on 2L [place] with exertion Abdominal: Soft, NT, ND, bowel sounds + Extremities: no edema, no cyanosis    The results of significant diagnostics from this hospitalization (including imaging, microbiology, ancillary and laboratory) are listed below for reference.     Microbiology: Recent Results (from the past 240 hour(s))  SARS Coronavirus 2 by RT PCR (hospital order, performed in [facility] hospital lab) Nasopharyngeal Nasopharyngeal  Swab     Status: Abnormal   Collection Time: 01/31/20  5:43 PM   Specimen: Nasopharyngeal Swab  Result Value Ref Range Status   SARS Coronavirus 2 POSITIVE (A) NEGATIVE Final    Comment: RESULT CALLED TO, READ BACK BY AND VERIFIED WITH: SMITH,J. RN _1  01/31/20 BILLINGSLEY,L (NOTE) SARS-CoV-2 target nucleic acids are DETECTED  SARS-CoV-2 RNA is generally detectable in upper respiratory specimens  during the acute phase of infection.  Positive results are indicative  of the presence of the identified virus, but do not rule out bacterial infection or co-infection with other pathogens not detected by the test.  Clinical correlation with patient history and  other diagnostic information is necessary to determine patient infection status.  The expected result is negative.  Fact Sheet for Patients:   StrictlyIdeas.no   Fact Sheet for Healthcare Providers:   BankingDealers.co.za    This test is not yet approved or cleared by the [place] FDA and  has been authorized for detection and/or diagnosis of SARS-CoV-2 by FDA under an Emergency Use Authorization (EUA).  This EUA will remain in effect (meaning thi s test can be used) for the duration of  the COVID-19 declaration under Section 564(b)(1) of the Act, 21 U.S.C. section 360-bbb-3(b)(1), unless the authorization is terminated or revoked sooner.  Performed at [facility], [place] [address], [place], [place] [address]   Blood Culture (routine x 2)     Status: Abnormal   Collection Time: 01/31/20  5:43 PM   Specimen: BLOOD  Result Value Ref Range Status   Specimen Description   Final    BLOOD RIGHT ANTECUBITAL Performed at [facility] [address] [place], [place] [address]    Special Requests   Final    BOTTLES DRAWN AEROBIC AND ANAEROBIC Blood Culture adequate volume Performed at [facility] [address],  [place], [place] [address]    Culture  Setup Time   Final    GRAM POSITIVE COCCI IN CLUSTERS ANAEROBIC BOTTLE ONLY Organism ID to follow CRITICAL RESULT CALLED TO, READ BACK BY AND VERIFIED WITH: Karel Jarvis PharmD 16:00 02/01/20 ([email or web address])    Culture (A)  Final    STAPHYLOCOCCUS CAPITIS STAPHYLOCOCCUS HOMINIS THE SIGNIFICANCE OF ISOLATING THIS ORGANISM FROM A SINGLE SET OF BLOOD CULTURES WHEN MULTIPLE SETS ARE DRAWN IS UNCERTAIN.  PLEASE NOTIFY THE MICROBIOLOGY DEPARTMENT WITHIN ONE WEEK IF SPECIATION AND SENSITIVITIES ARE REQUIRED. Performed at [facility], [place] [address] [place], [place] [address]    Report Status 02/04/2020 FINAL  Final  Blood Culture (routine x 2)     Status: None (Preliminary result)   Collection Time: 01/31/20  5:43 PM   Specimen: BLOOD  Result Value Ref Range Status   Specimen Description   Final    BLOOD RIGHT ANTECUBITAL Performed at [facility] [address] [place], [place] [address]    Special Requests   Final    BOTTLES DRAWN AEROBIC AND ANAEROBIC Blood Culture results may not be optimal due to an excessive volume of blood received in culture bottles Performed at [facility] [address], [place], [place] [address]    Culture   Final    NO GROWTH 4 DAYS Performed at [facility], [place] [address], [place], [place] [address]    Report Status PENDING  Incomplete  Blood Culture ID Panel (Reflexed)     Status: Abnormal   Collection Time: 01/31/20  5:43 PM  Result Value Ref Range Status   Enterococcus faecalis NOT DETECTED NOT DETECTED Final   Enterococcus Faecium NOT DETECTED NOT DETECTED Final   Listeria monocytogenes NOT DETECTED NOT DETECTED Final   Staphylococcus species DETECTED (A) NOT DETECTED Final    Comment: CRITICAL RESULT CALLED TO, READ BACK BY AND VERIFIED WITH: Karel Jarvis PharmD 16:00 02/01/20 ([email or web address])    Staphylococcus aureus (BCID) NOT DETECTED NOT DETECTED Final   Staphylococcus  epidermidis NOT DETECTED NOT DETECTED Final   Staphylococcus lugdunensis NOT DETECTED NOT DETECTED Final   Streptococcus species NOT DETECTED NOT DETECTED Final   Streptococcus agalactiae NOT DETECTED NOT DETECTED Final   Streptococcus pneumoniae NOT DETECTED NOT DETECTED Final   Streptococcus pyogenes NOT DETECTED NOT DETECTED Final   A.calcoaceticus-baumannii NOT DETECTED NOT DETECTED Final   Bacteroides fragilis NOT DETECTED NOT DETECTED Final   Enterobacterales NOT DETECTED NOT DETECTED Final   Enterobacter cloacae complex NOT DETECTED NOT DETECTED Final   Escherichia coli NOT DETECTED NOT DETECTED Final   Klebsiella aerogenes NOT DETECTED NOT DETECTED Final   Klebsiella oxytoca NOT DETECTED NOT DETECTED Final   Klebsiella pneumoniae NOT DETECTED NOT DETECTED Final   Proteus species NOT DETECTED NOT DETECTED Final   Salmonella species NOT DETECTED NOT DETECTED Final   Serratia marcescens NOT DETECTED NOT DETECTED Final   Haemophilus influenzae NOT DETECTED NOT DETECTED Final   Neisseria meningitidis NOT DETECTED NOT DETECTED Final   Pseudomonas aeruginosa NOT DETECTED NOT DETECTED Final   Stenotrophomonas maltophilia NOT DETECTED NOT DETECTED Final   Candida albicans NOT DETECTED NOT DETECTED Final   Candida auris NOT DETECTED NOT DETECTED Final   Candida glabrata NOT DETECTED NOT DETECTED Final   Candida krusei NOT DETECTED NOT DETECTED Final   Candida parapsilosis NOT DETECTED NOT DETECTED Final   Candida tropicalis NOT DETECTED NOT DETECTED Final   Cryptococcus neoformans/gattii NOT DETECTED NOT DETECTED Final    Comment: Performed at [facility], [address] [place], [place] [address]     Labs: BNP (last 3 results) No results for input(s): BNP in the last 8760 hours. Basic Metabolic Panel: Recent Labs  Lab 02/01/20 1047 02/02/20 0424 02/03/20 0427 02/04/20 0437 02/05/20 0441  NA 138 139 141 142 139  K 4.2 4.1 4.7 4.5 4.4  CL 101 104 103 104 100  CO2 29  25  _0 GLUCOSE 154* 216* 222* 222* 194*  BUN 23 26* 40* 39* 39*  CREATININE 0.90 0.80 0.87 0.82 0.85  CALCIUM 8.1* 8.5* 8.8* 8.8* 8.7*  MG  --  2.0  --   --   --   PHOS  --  2.7  --   --   --    Liver Function Tests: Recent Labs  Lab 01/31/20 1743 02/02/20 0424 02/03/20 0427 02/04/20 0437 02/05/20 0441  AST _1 38 35  ALT _2 40 50*  ALKPHOS 58 50 68 72 66  BILITOT 0.8 0.4 0.5 0.3 0.3  PROT 6.6 6.1* 6.6 6.4* 6.0*  ALBUMIN 3.1* 2.8* 2.9* 2.8* 2.7*   No results for input(s): LIPASE, AMYLASE in the last 168 hours. No results for input(s): AMMONIA in the last 168 hours. CBC: Recent Labs  Lab 02/01/20 1047 02/02/20 0424 02/03/20 0427 02/04/20 0437 02/05/20 0441  WBC 5.4 4.1 11.5* 12.1* 9.7  NEUTROABS 4.1 3.4 10.1* 11.0* 8.0*  HGB 13.0 13.4 13.7 13.2 13.5  HCT 41.8 43.5 43.4 42.0 43.2  MCV 97.2 97.5 96.2 96.6 96.4  PLT 102* 110* 153 185 187   Cardiac Enzymes: No results for input(s): CKTOTAL, CKMB, CKMBINDEX, TROPONINI in the last 168 hours. BNP: Invalid input(s): POCBNP CBG: Recent Labs  Lab 02/04/20 1132 02/04/20 1630 02/04/20 2127 02/05/20 0757 02/05/20 1136  GLUCAP 244* 326* 300* 137* 131*   D-Dimer Recent Labs    02/04/20 0437 02/05/20 0441  DDIMER 2.16* 2.09*   Hgb A1c No results for input(s): HGBA1C in the last 72 hours. Lipid Profile No results for input(s): CHOL, HDL, LDLCALC, TRIG, CHOLHDL, LDLDIRECT in the last 72 hours. Thyroid function studies No results for input(s): TSH, T4TOTAL, T3FREE, THYROIDAB in the last 72 hours.  Invalid input(s): FREET3 Anemia work up No results for input(s): VITAMINB12, FOLATE, FERRITIN, TIBC, IRON, RETICCTPCT in the last 72 hours. Urinalysis    Component Value Date/Time   COLORURINE YELLOW 09/01/2017 1619   APPEARANCEUR CLEAR 09/01/2017 1619   LABSPEC 1.010 09/01/2017 1619   PHURINE 6.0 09/01/2017 1619   GLUCOSEU 100 (A) 09/01/2017 1619   HGBUR NEGATIVE 09/01/2017 1619   BILIRUBINUR  NEGATIVE 09/01/2017 1619   KETONESUR NEGATIVE 09/01/2017 1619   PROTEINUR NEGATIVE 07/05/2014 0932   UROBILINOGEN 0.2 09/01/2017 1619   NITRITE NEGATIVE 09/01/2017 1619   LEUKOCYTESUR NEGATIVE 09/01/2017 1619   Sepsis Labs Invalid input(s): PROCALCITONIN,  WBC,  LACTICIDVEN Microbiology Recent Results (from the past 240 hour(s))  SARS Coronavirus 2 by RT PCR (hospital order, performed in [facility] hospital lab) Nasopharyngeal Nasopharyngeal Swab     Status: Abnormal   Collection Time: 01/31/20  5:43 PM   Specimen: Nasopharyngeal Swab  Result Value Ref Range Status   SARS Coronavirus 2 POSITIVE (A) NEGATIVE Final    Comment: RESULT CALLED TO, READ BACK BY AND VERIFIED WITH: SMITH,J. RN _3  01/31/20 BILLINGSLEY,L (NOTE) SARS-CoV-2 target nucleic acids are DETECTED  SARS-CoV-2 RNA is generally detectable in upper respiratory specimens  during the acute phase of infection.  Positive results are indicative  of the presence of the identified virus, but do not rule out bacterial infection or co-infection with other pathogens not detected by the test.  Clinical correlation with patient history and  other diagnostic information is necessary to determine patient infection status.  The expected result is negative.  Fact Sheet for Patients:   StrictlyIdeas.no   Fact Sheet for Healthcare Providers:   BankingDealers.co.za    This  test is not yet approved or cleared by the [place] and  has been authorized for detection and/or diagnosis of SARS-CoV-2 by FDA under an Emergency Use Authorization (EUA).  This EUA will remain in effect (meaning thi s test can be used) for the duration of  the COVID-19 declaration under Section 564(b)(1) of the Act, 21 U.S.C. section 360-bbb-3(b)(1), unless the authorization is terminated or revoked sooner.  Performed at [facility], [place] [address] [place], [place] [address]    Blood Culture (routine x 2)     Status: Abnormal   Collection Time: 01/31/20  5:43 PM   Specimen: BLOOD  Result Value Ref Range Status   Specimen Description   Final    BLOOD RIGHT ANTECUBITAL Performed at [facility] [address] [place], [place] [address]    Special Requests   Final    BOTTLES DRAWN AEROBIC AND ANAEROBIC Blood Culture adequate volume Performed at [facility] [address], [place], [place] [address]    Culture  Setup Time   Final    GRAM POSITIVE COCCI IN CLUSTERS ANAEROBIC BOTTLE ONLY Organism ID to follow CRITICAL RESULT CALLED TO, READ BACK BY AND VERIFIED WITH: Karel Jarvis PharmD 16:00 02/01/20 ([email or web address])    Culture (A)  Final    STAPHYLOCOCCUS CAPITIS STAPHYLOCOCCUS HOMINIS THE SIGNIFICANCE OF ISOLATING THIS ORGANISM FROM A SINGLE SET OF BLOOD CULTURES WHEN MULTIPLE SETS ARE DRAWN IS UNCERTAIN. PLEASE NOTIFY THE MICROBIOLOGY DEPARTMENT WITHIN ONE WEEK IF SPECIATION AND SENSITIVITIES ARE REQUIRED. Performed at [facility], [place] [address], [place], [place] [address]    Report Status 02/04/2020 FINAL  Final  Blood Culture (routine x 2)     Status: None (Preliminary result)   Collection Time: 01/31/20  5:43 PM   Specimen: BLOOD  Result Value Ref Range Status   Specimen Description   Final    BLOOD RIGHT ANTECUBITAL Performed at [facility] [address] [place], [place] [address]    Special Requests   Final    BOTTLES DRAWN AEROBIC AND ANAEROBIC Blood Culture results may not be optimal due to an excessive volume of blood received in culture bottles Performed at [facility] [address] [place], [place] [address]    Culture   Final    NO GROWTH 4 DAYS Performed at [facility], [place] [address] [place], [place] [address]    Report Status PENDING  Incomplete  Blood Culture ID Panel (Reflexed)     Status: Abnormal   Collection Time: 01/31/20  5:43 PM   Result Value Ref Range Status   Enterococcus faecalis NOT DETECTED NOT DETECTED Final   Enterococcus Faecium NOT DETECTED NOT DETECTED Final   Listeria monocytogenes NOT DETECTED NOT DETECTED Final   Staphylococcus species DETECTED (A) NOT DETECTED Final    Comment: CRITICAL RESULT CALLED TO, READ BACK BY AND VERIFIED WITH: Karel Jarvis PharmD 16:00 02/01/20 ([email or web address])    Staphylococcus aureus (BCID) NOT DETECTED NOT DETECTED Final   Staphylococcus epidermidis NOT DETECTED NOT DETECTED Final   Staphylococcus lugdunensis NOT DETECTED NOT DETECTED Final   Streptococcus species NOT DETECTED NOT DETECTED Final   Streptococcus agalactiae NOT DETECTED NOT DETECTED Final   Streptococcus pneumoniae NOT DETECTED NOT DETECTED Final   Streptococcus pyogenes NOT DETECTED NOT DETECTED Final   A.calcoaceticus-baumannii NOT DETECTED NOT DETECTED Final   Bacteroides fragilis NOT DETECTED NOT DETECTED Final   Enterobacterales NOT DETECTED NOT DETECTED Final  Enterobacter cloacae complex NOT DETECTED NOT DETECTED Final   Escherichia coli NOT DETECTED NOT DETECTED Final   Klebsiella aerogenes NOT DETECTED NOT DETECTED Final   Klebsiella oxytoca NOT DETECTED NOT DETECTED Final   Klebsiella pneumoniae NOT DETECTED NOT DETECTED Final   Proteus species NOT DETECTED NOT DETECTED Final   Salmonella species NOT DETECTED NOT DETECTED Final   Serratia marcescens NOT DETECTED NOT DETECTED Final   Haemophilus influenzae NOT DETECTED NOT DETECTED Final   Neisseria meningitidis NOT DETECTED NOT DETECTED Final   Pseudomonas aeruginosa NOT DETECTED NOT DETECTED Final   Stenotrophomonas maltophilia NOT DETECTED NOT DETECTED Final   Candida albicans NOT DETECTED NOT DETECTED Final   Candida auris NOT DETECTED NOT DETECTED Final   Candida glabrata NOT DETECTED NOT DETECTED Final   Candida krusei NOT DETECTED NOT DETECTED Final   Candida parapsilosis NOT DETECTED NOT DETECTED Final   Candida tropicalis NOT DETECTED NOT  DETECTED Final   Cryptococcus neoformans/gattii NOT DETECTED NOT DETECTED Final    Comment: Performed at [facility], [place] [address], [place], [place] [address]     Time coordinating discharge: Over 30 minutes  SIGNED:    J [place] DO  [facility] 02/05/2020, 2:24 PM

## 2020-02-05 NOTE — Telephone Encounter (Signed)
Post ED Visit - Positive Culture Follow-up  Culture report reviewed by antimicrobial stewardship pharmacist: [facility] []  , Pharm.D. []  Enzo Bi, Pharm.D., BCPS AQ-ID []  , Pharm.D., BCPS []  Celedonio Miyamoto, Pharm.D., BCPS []  [place], Garvin Fila.D., BCPS, AAHIVP []  , Pharm.D., BCPS, AAHIVP []  Georgina Pillion, PharmD, BCPS []  , PharmD, BCPS []  [place], PharmD, BCPS []  [address], PharmD []  , PharmD, BCPS []  Estella Husk, PharmD  Pharmacy Team []  Lysle Pearl, PharmD []  , PharmD []  Phillips Climes, PharmD []  , Rph []  Agapito Games) , PharmD []  Verlan Friends, PharmD []  , PharmD [x]  Mervyn Gay, PharmD []  , PharmD []  Vinnie Level, PharmD []  W[facility], PharmD []  , PharmD []  Len Childs, PharmD   Positive blood culture Currently an inpatient at [facility], no further patient follow-up is required at this time.  Greer Pickerel 02/05/2020, 10:41 AM

## 2020-02-06 DIAGNOSIS — J9691 Respiratory failure, unspecified with hypoxia: Secondary | ICD-10-CM | POA: Diagnosis not present

## 2020-02-06 DIAGNOSIS — R9431 Abnormal electrocardiogram [ECG] [EKG]: Secondary | ICD-10-CM | POA: Diagnosis not present

## 2020-02-06 DIAGNOSIS — J1282 Pneumonia due to coronavirus disease 2019: Secondary | ICD-10-CM | POA: Diagnosis not present

## 2020-02-06 DIAGNOSIS — U071 COVID-19: Secondary | ICD-10-CM | POA: Diagnosis not present

## 2020-02-07 DIAGNOSIS — B9732 Oncovirus as the cause of diseases classified elsewhere: Secondary | ICD-10-CM | POA: Diagnosis not present

## 2020-02-07 DIAGNOSIS — F0390 Unspecified dementia without behavioral disturbance: Secondary | ICD-10-CM | POA: Diagnosis not present

## 2020-02-07 DIAGNOSIS — R2681 Unsteadiness on feet: Secondary | ICD-10-CM | POA: Diagnosis not present

## 2020-02-07 DIAGNOSIS — M6281 Muscle weakness (generalized): Secondary | ICD-10-CM | POA: Diagnosis not present

## 2020-02-07 LAB — CULTURE, BLOOD (ROUTINE X 2): Culture: NO GROWTH

## 2020-02-09 DIAGNOSIS — B9732 Oncovirus as the cause of diseases classified elsewhere: Secondary | ICD-10-CM | POA: Diagnosis not present

## 2020-02-09 DIAGNOSIS — R2681 Unsteadiness on feet: Secondary | ICD-10-CM | POA: Diagnosis not present

## 2020-02-09 DIAGNOSIS — F0390 Unspecified dementia without behavioral disturbance: Secondary | ICD-10-CM | POA: Diagnosis not present

## 2020-02-09 DIAGNOSIS — M6281 Muscle weakness (generalized): Secondary | ICD-10-CM | POA: Diagnosis not present

## 2020-02-12 DIAGNOSIS — M6281 Muscle weakness (generalized): Secondary | ICD-10-CM | POA: Diagnosis not present

## 2020-02-12 DIAGNOSIS — F0390 Unspecified dementia without behavioral disturbance: Secondary | ICD-10-CM | POA: Diagnosis not present

## 2020-02-12 DIAGNOSIS — B9732 Oncovirus as the cause of diseases classified elsewhere: Secondary | ICD-10-CM | POA: Diagnosis not present

## 2020-02-12 DIAGNOSIS — R2681 Unsteadiness on feet: Secondary | ICD-10-CM | POA: Diagnosis not present

## 2020-02-16 DIAGNOSIS — B9732 Oncovirus as the cause of diseases classified elsewhere: Secondary | ICD-10-CM | POA: Diagnosis not present

## 2020-02-16 DIAGNOSIS — M6281 Muscle weakness (generalized): Secondary | ICD-10-CM | POA: Diagnosis not present

## 2020-02-16 DIAGNOSIS — E118 Type 2 diabetes mellitus with unspecified complications: Secondary | ICD-10-CM | POA: Diagnosis not present

## 2020-02-16 DIAGNOSIS — F0390 Unspecified dementia without behavioral disturbance: Secondary | ICD-10-CM | POA: Diagnosis not present

## 2020-02-16 DIAGNOSIS — R9431 Abnormal electrocardiogram [ECG] [EKG]: Secondary | ICD-10-CM | POA: Diagnosis not present

## 2020-02-16 DIAGNOSIS — R2681 Unsteadiness on feet: Secondary | ICD-10-CM | POA: Diagnosis not present

## 2020-02-16 DIAGNOSIS — U071 COVID-19: Secondary | ICD-10-CM | POA: Diagnosis not present

## 2020-02-16 DIAGNOSIS — J1282 Pneumonia due to coronavirus disease 2019: Secondary | ICD-10-CM | POA: Diagnosis not present

## 2020-02-19 DIAGNOSIS — M6281 Muscle weakness (generalized): Secondary | ICD-10-CM | POA: Diagnosis not present

## 2020-02-19 DIAGNOSIS — F0390 Unspecified dementia without behavioral disturbance: Secondary | ICD-10-CM | POA: Diagnosis not present

## 2020-02-19 DIAGNOSIS — B9732 Oncovirus as the cause of diseases classified elsewhere: Secondary | ICD-10-CM | POA: Diagnosis not present

## 2020-02-19 DIAGNOSIS — R2681 Unsteadiness on feet: Secondary | ICD-10-CM | POA: Diagnosis not present

## 2020-02-20 DIAGNOSIS — E118 Type 2 diabetes mellitus with unspecified complications: Secondary | ICD-10-CM | POA: Diagnosis not present

## 2020-02-20 DIAGNOSIS — R9431 Abnormal electrocardiogram [ECG] [EKG]: Secondary | ICD-10-CM | POA: Diagnosis not present

## 2020-02-20 DIAGNOSIS — U071 COVID-19: Secondary | ICD-10-CM | POA: Diagnosis not present

## 2020-02-20 DIAGNOSIS — J9691 Respiratory failure, unspecified with hypoxia: Secondary | ICD-10-CM | POA: Diagnosis not present

## 2020-02-23 ENCOUNTER — Other Ambulatory Visit: Payer: Self-pay | Admitting: *Deleted

## 2020-02-23 ENCOUNTER — Telehealth: Payer: Self-pay | Admitting: Internal Medicine

## 2020-02-23 DIAGNOSIS — R300 Dysuria: Secondary | ICD-10-CM | POA: Diagnosis not present

## 2020-02-23 NOTE — Telephone Encounter (Signed)
Very sorry, as many many many many many many many many times symptoms by itself is not reliable to tell about having a UTI  Ok I placed order; or can also consider video visit sat AM clinic, or UC

## 2020-02-23 NOTE — Telephone Encounter (Signed)
Patient's daughter called and said that she was pretty certain that her mother has a UTI, said that her mother is experiencing burning with urination, she was just discharged from the rehab facility and was wondering if there was any way that something could be called in without doing a UA, or bringing her into the office sine the patient just got over Covid.    Please call [name]: [phone]

## 2020-02-23 NOTE — Addendum Note (Signed)
Addended by: Merrilyn Puma on: 02/23/2020 04:59 PM   Modules accepted: Orders

## 2020-02-23 NOTE — Addendum Note (Signed)
Addended by: Merrilyn Puma on: 02/23/2020 04:58 PM   Modules accepted: Orders

## 2020-02-23 NOTE — Telephone Encounter (Signed)
Daughter requesting urine be collected by [facility]; wants requisition sent to [facility] assisted living & they can have [facility] result.  Requisition addended & faxed to number requested.

## 2020-02-25 LAB — MICROSCOPIC EXAMINATION
Casts: NONE SEEN /lpf
Epithelial Cells (non renal): NONE SEEN /hpf (ref 0–10)
RBC, Urine: NONE SEEN /hpf (ref 0–2)
WBC, UA: >30 /hpf — AB (ref 0–5)

## 2020-02-25 LAB — URINALYSIS, ROUTINE W REFLEX MICROSCOPIC
Bilirubin, UA: NEGATIVE
Ketones, UA: NEGATIVE
Nitrite, UA: NEGATIVE
Protein,UA: NEGATIVE
Specific Gravity, UA: 1.011 (ref 1.005–1.030)
Urobilinogen, Ur: 0.2 mg/dL (ref 0.2–1.0)
pH, UA: 5 (ref 5.0–7.5)

## 2020-02-26 ENCOUNTER — Encounter: Payer: Self-pay | Admitting: Internal Medicine

## 2020-02-26 ENCOUNTER — Other Ambulatory Visit: Payer: Self-pay | Admitting: Internal Medicine

## 2020-02-26 MED ORDER — CIPROFLOXACIN HCL 500 MG PO TABS: 500.0000 mg | ORAL_TABLET | Freq: Two times a day (BID) | ORAL | 0 refills | Status: DC

## 2020-02-28 ENCOUNTER — Telehealth: Payer: Self-pay | Admitting: Internal Medicine

## 2020-02-28 ENCOUNTER — Encounter: Payer: Self-pay | Admitting: Internal Medicine

## 2020-02-28 LAB — URINE CULTURE

## 2020-02-28 NOTE — Telephone Encounter (Signed)
    Melissa from [facility] requesting order to give ciprofloxacin (CIPRO) 500 MG tablet be faxed to facility Fax [phone] Phone  [phone]

## 2020-02-29 MED ORDER — CIPROFLOXACIN HCL 500 MG PO TABS: 500.0000 mg | ORAL_TABLET | Freq: Two times a day (BID) | ORAL | 0 refills | Status: DC

## 2020-02-29 NOTE — Telephone Encounter (Signed)
Done hardcopy to Kathy Thomas 

## 2020-02-29 NOTE — Telephone Encounter (Signed)
Sent to Dr. John. 

## 2020-03-06 ENCOUNTER — Telehealth: Payer: Self-pay | Admitting: Internal Medicine

## 2020-03-06 NOTE — Telephone Encounter (Signed)
Mark with [facility] called and is requesting verbal orders for physical therapy for 1w1,2w4,1w2, starting on 03/05/2020. Loraine Leriche said if he does not answer you can leave a message on [phone]

## 2020-03-06 NOTE — Telephone Encounter (Signed)
Ok for verbals 

## 2020-03-08 DIAGNOSIS — M25552 Pain in left hip: Secondary | ICD-10-CM | POA: Diagnosis not present

## 2020-03-08 DIAGNOSIS — M25562 Pain in left knee: Secondary | ICD-10-CM | POA: Diagnosis not present

## 2020-03-08 NOTE — Telephone Encounter (Signed)
LDVM for Kathy Thomas with [facility] with a verbal OK for physical therapy for 1w1,2w4,1w2, starting on 03/05/2020.

## 2020-03-11 ENCOUNTER — Other Ambulatory Visit: Payer: Self-pay | Admitting: Internal Medicine

## 2020-03-11 MED ORDER — NYSTATIN 100000 UNIT/GM EX POWD: CUTANEOUS | Status: DC

## 2020-03-14 NOTE — Progress Notes (Signed)
Subjective:    Patient ID: Kathy Thomas, female    DOB: 04-Sep-1934, 85 y.o.   MRN: [id number]  HPI The patient is here for an acute visit.  She is here with her daughter who provides the history because of the patient's dementia.   Lump in right cheek-her daughter noticed a lump in her right cheek few days ago.  The patient has dementia and did not even realize she had it there.  Her daughter thinks it may have decreased in size slightly since she first noticed it.  There was slight redness overlying the bump when she first noticed it.  The patient has not complained of any pain with eating, sore throat or other symptoms.  She has dentures.  Medications and allergies reviewed with patient and updated if appropriate.  Patient Active Problem List   Diagnosis Date Noted  . Pneumonia due to COVID-19 virus 02/01/2020  . Acute respiratory failure with hypoxia (HCC) 02/01/2020  . Protein calorie malnutrition (HCC) 02/01/2020  . Thrombocytopenia (HCC) 02/01/2020  . Encounter for examination for admission to assisted living facility 10/20/2019  . Pain due to onychomycosis of toenails of both feet 12/20/2018  . Diabetes mellitus without complication (HCC) 12/20/2018  . Diarrhea 03/07/2018  . Acute right ankle pain 04/08/2017  . Weight loss 06/25/2016  . Iron deficiency 12/30/2015  . Generalized weakness 07/08/2014  . Systolic CHF, chronic (HCC) 07/07/2014  . Vitamin B12 deficiency 07/06/2014  . Left fibular fracture 07/05/2014  . Closed fibular fracture 07/05/2014  . Left ankle pain 07/04/2014  . Left knee pain 07/04/2014  . Gait disorder 01/10/2014  . Ruptured sebaceous cyst 10/16/2013  . Dementia without behavioral disturbance (HCC) 09/08/2013  . Left lumbar radiculopathy 10/28/2011  . Grief reaction 10/28/2011  . Insect bite, infected 10/28/2011  . Preventative health care 12/06/2010  . OSTEOPOROSIS 06/04/2010  . ECCHYMOSES, SPONTANEOUS 06/04/2010  . ANEMIA-IRON DEFICIENCY  10/26/2008  . MENOPAUSAL DISORDER 10/26/2008  . DM type 2 (diabetes mellitus, type 2) (HCC) 10/06/2007  . Hyperlipidemia 10/06/2007  . ANXIETY 10/06/2007  . CHOLELITHIASIS 10/06/2007  . SHOULDER PAIN, LEFT 10/06/2007  . LOW BACK PAIN 01/29/2007  . Essential hypertension 01/26/2007  . GERD 01/26/2007  . DIVERTICULOSIS, COLON 01/26/2007  . HERNIATED DISC 01/26/2007  . History of colonic polyps 01/26/2007    Current Outpatient Medications on File Prior to Visit  Medication Sig Dispense Refill  . acetaminophen (TYLENOL) 500 MG tablet Take 1,000 mg by mouth every 6 (six) hours as needed for mild pain or headache.    . alendronate (FOSAMAX) 70 MG tablet Take with a full glass of water on an empty stomach. 12 tablet 0  . Ascorbic Acid (VITAMIN C) 500 MG CAPS Take by mouth.    . Cholecalciferol (VITAMIN D3 PO) Take 1 capsule by mouth daily.    Marland Kitchen FIBER PO Take 1 tablet by mouth daily.    . Multiple Vitamins-Minerals (ZINC PO) Take 1 tablet by mouth daily.    Marland Kitchen nystatin (MYCOSTATIN/NYSTOP) powder Use as directed twice daily as needed to affected area 60 g 01  . Probiotic Product (PROBIOTIC PO) Take 1 capsule by mouth daily.    . vitamin B-12 1000 MCG tablet Take 1 tablet (1,000 mcg total) by mouth daily. 30 tablet 1  . atorvastatin (LIPITOR) 40 MG tablet Take 1 tablet (40 mg total) by mouth daily. 30 tablet 0  . dicyclomine (BENTYL) 20 MG tablet Take 1 tablet (20 mg total) by mouth 2 (two)  times daily. 60 tablet 0  . donepezil (ARICEPT) 10 MG tablet Take 1 tablet (10 mg total) by mouth daily. 30 tablet 0  . losartan (COZAAR) 100 MG tablet Take 1 tablet (100 mg total) by mouth daily. Annual appt due in Sept must see provider for future refills 30 tablet 0  . pioglitazone (ACTOS) 30 MG tablet Take 1 tablet (30 mg total) by mouth every morning. 30 tablet 0   No current facility-administered medications on file prior to visit.    Past Medical History:  Diagnosis Date  . Acute sinusitis,  unspecified 05/28/2008  . ANEMIA-IRON DEFICIENCY 10/26/2008  . ANXIETY 10/06/2007  . CHOLELITHIASIS 10/06/2007  . COLONIC POLYPS, HX OF 01/26/2007  . DIABETES MELLITUS, TYPE II 10/06/2007  . DIVERTICULOSIS, COLON 01/26/2007  . ECCHYMOSES, SPONTANEOUS 06/04/2010  . GERD 01/26/2007  . Heart murmur    slight heart murmur  . HERNIATED DISC 01/26/2007  . HYPERLIPIDEMIA 10/06/2007  . HYPERTENSION 01/26/2007  . LOW BACK PAIN 01/29/2007  . MENOPAUSAL DISORDER 10/26/2008  . OSTEOPOROSIS 06/04/2010  . SHOULDER PAIN, LEFT 10/06/2007  . Stroke [facility])    TIA's    Past Surgical History:  Procedure Laterality Date  . ABDOMINAL HYSTERECTOMY    . APPENDECTOMY    . Fracture left  wrist  2010   Dr. Amanda Pea  . Herniated disk   1995   C-spine  . Left ankle-screw placement  1980  . Left elbow-nerve impingement  1985  . TONSILLECTOMY      Social History   Socioeconomic History  . Marital status: Widowed    Spouse name: Not on file  . Number of children: Not on file  . Years of education: Not on file  . Highest education level: Not on file  Occupational History  . Not on file  Tobacco Use  . Smoking status: Former Smoker    Packs/day: 0.00    Types: Cigarettes    Quit date: 06/29/2013    Years since quitting: 6.7  . Smokeless tobacco: Never Used  Vaping Use  . Vaping Use: Never used  Substance and Sexual Activity  . Alcohol use: No  . Drug use: No  . Sexual activity: Not on file  Other Topics Concern  . Not on file  Social History Narrative  . Not on file   Social Determinants of Health   Financial Resource Strain:   . Difficulty of Paying Living Expenses: Not on file  Food Insecurity:   . Worried About Programme researcher, broadcasting/film/video in the Last Year: Not on file  . Ran Out of Food in the Last Year: Not on file  Transportation Needs:   . Lack of Transportation (Medical): Not on file  . Lack of Transportation (Non-Medical): Not on file  Physical Activity:   . Days of Exercise per Week: Not on file  .  Minutes of Exercise per Session: Not on file  Stress:   . Feeling of Stress : Not on file  Social Connections:   . Frequency of Communication with Friends and Family: Not on file  . Frequency of Social Gatherings with Friends and Family: Not on file  . Attends Religious Services: Not on file  . Active Member of Clubs or Organizations: Not on file  . Attends Banker Meetings: Not on file  . Marital Status: Not on file    Family History  Problem Relation Age of Onset  . Heart disease Father   . Hyperlipidemia Other   . Stroke  Other   . Hypertension Other   . Esophageal cancer Neg Hx   . Colon polyps Neg Hx   . Rectal cancer Neg Hx   . Stomach cancer Neg Hx     Review of Systems  Constitutional: Negative for fever.  HENT: Negative for congestion, mouth sores and sore throat.   Respiratory: Negative for cough.   Neurological: Negative for light-headedness and headaches.       Objective:   Vitals:   03/15/20 1506  BP: 128/84  Pulse: 62  Temp: 98.2 F (36.8 C)  SpO2: 92%   BP Readings from Last 3 Encounters:  03/15/20 128/84  02/05/20 (!) 131/47  01/31/20 (!) 135/105   Wt Readings from Last 3 Encounters:  03/15/20 214 lb (97.1 kg)  02/01/20 207 lb 0.2 oz (93.9 kg)  10/20/19 207 lb (93.9 kg)   Body mass index is 40.43 kg/m.   Physical Exam Constitutional:      General: She is not in acute distress.    Appearance: Normal appearance. She is not ill-appearing.  HENT:     Head: Normocephalic and atraumatic.     Nose: Nose normal.     Mouth/Throat:     Mouth: Mucous membranes are moist.     Pharynx: No oropharyngeal exudate or posterior oropharyngeal erythema.     Comments: No ulcers or sores.  Dentures removed and gums look healthy and are nontender to palpation.  Slight erythema inside of right cheek where salivary gland duct is-nontender.  Palpable nontender lump lower jaw on the right side near chin Musculoskeletal:     Cervical back: Neck  supple. No tenderness.  Lymphadenopathy:     Cervical: No cervical adenopathy.  Skin:    General: Skin is warm and dry.  Neurological:     Mental Status: She is alert.            Assessment & Plan:    See Problem List for Assessment and Plan of chronic medical problems.    This visit occurred during the SARS-CoV-2 public health emergency.  Safety protocols were in place, including screening questions prior to the visit, additional usage of staff PPE, and extensive cleaning of exam room while observing appropriate contact time as indicated for disinfecting solutions.

## 2020-03-15 ENCOUNTER — Telehealth: Payer: Self-pay | Admitting: Internal Medicine

## 2020-03-15 ENCOUNTER — Other Ambulatory Visit: Payer: Self-pay

## 2020-03-15 ENCOUNTER — Ambulatory Visit (INDEPENDENT_AMBULATORY_CARE_PROVIDER_SITE_OTHER): Payer: Medicare Other | Admitting: Internal Medicine

## 2020-03-15 ENCOUNTER — Encounter: Payer: Self-pay | Admitting: Internal Medicine

## 2020-03-15 VITALS — BP 128/84 | HR 62 | Temp 98.2°F | Ht 61.0 in | Wt 214.0 lb

## 2020-03-15 DIAGNOSIS — K1120 Sialoadenitis, unspecified: Secondary | ICD-10-CM

## 2020-03-15 MED ORDER — CIPROFLOXACIN HCL 500 MG PO TABS: 500.0000 mg | ORAL_TABLET | Freq: Two times a day (BID) | ORAL | 0 refills | Status: AC

## 2020-03-15 NOTE — Telephone Encounter (Signed)
I dont see this med on current med list

## 2020-03-15 NOTE — Patient Instructions (Addendum)
You have a salivary gland infection.      Take the cipro as directed.    Increase water or lemonade or beverage of choice.  Please make sure she drinks 6 ounces of fluid in between meals three times a day.    If the infection does not completely resolve please let us know.

## 2020-03-15 NOTE — Telephone Encounter (Signed)
    [place] from [place] calling to discuss recent order sent for Meloxicam.  Asking for order to be sent to Fax # [phone], states clarification is needed Any questions call [phone]

## 2020-03-15 NOTE — Assessment & Plan Note (Signed)
Acute Symptoms suggestive of submandibular gland infection She is a penicillin allergy and has not been an IV cephalosporin so will start Cipro 500 mg twice daily x1 week Advised to increase fluids Because of her dementia will be difficult to do warm compresses Her daughter will monitor this closely to make sure it completely resolves-it does not she will let us know

## 2020-03-15 NOTE — Telephone Encounter (Signed)
Sent to Dr. John. 

## 2020-03-16 ENCOUNTER — Other Ambulatory Visit: Payer: Self-pay | Admitting: Internal Medicine

## 2020-03-16 NOTE — Telephone Encounter (Signed)
Please refill as per office routine med refill policy (all routine meds refilled for 3 mo or monthly per pt preference up to one year from last visit, then month to month grace period for 3 mo, then further med refills will have to be denied)  

## 2020-03-21 ENCOUNTER — Encounter: Payer: Self-pay | Admitting: Internal Medicine

## 2020-03-21 NOTE — Telephone Encounter (Signed)
error 

## 2020-03-22 ENCOUNTER — Other Ambulatory Visit: Payer: Self-pay | Admitting: Internal Medicine

## 2020-03-22 MED ORDER — MELOXICAM 7.5 MG PO TABS: 7.5000 mg | ORAL_TABLET | Freq: Every day | ORAL | 5 refills | Status: DC

## 2020-03-26 DIAGNOSIS — M810 Age-related osteoporosis without current pathological fracture: Secondary | ICD-10-CM | POA: Diagnosis not present

## 2020-03-26 DIAGNOSIS — I509 Heart failure, unspecified: Secondary | ICD-10-CM | POA: Diagnosis not present

## 2020-03-26 DIAGNOSIS — I110 Hypertensive heart disease with heart failure: Secondary | ICD-10-CM | POA: Diagnosis not present

## 2020-03-26 DIAGNOSIS — R1312 Dysphagia, oropharyngeal phase: Secondary | ICD-10-CM | POA: Diagnosis not present

## 2020-03-26 DIAGNOSIS — F0280 Dementia in other diseases classified elsewhere without behavioral disturbance: Secondary | ICD-10-CM | POA: Diagnosis not present

## 2020-03-26 DIAGNOSIS — E785 Hyperlipidemia, unspecified: Secondary | ICD-10-CM | POA: Diagnosis not present

## 2020-03-26 DIAGNOSIS — I69391 Dysphagia following cerebral infarction: Secondary | ICD-10-CM | POA: Diagnosis not present

## 2020-03-26 DIAGNOSIS — G309 Alzheimer's disease, unspecified: Secondary | ICD-10-CM | POA: Diagnosis not present

## 2020-03-26 DIAGNOSIS — E119 Type 2 diabetes mellitus without complications: Secondary | ICD-10-CM | POA: Diagnosis not present

## 2020-03-27 DIAGNOSIS — F0280 Dementia in other diseases classified elsewhere without behavioral disturbance: Secondary | ICD-10-CM | POA: Diagnosis not present

## 2020-03-27 DIAGNOSIS — G309 Alzheimer's disease, unspecified: Secondary | ICD-10-CM | POA: Diagnosis not present

## 2020-03-27 DIAGNOSIS — I69391 Dysphagia following cerebral infarction: Secondary | ICD-10-CM | POA: Diagnosis not present

## 2020-03-27 DIAGNOSIS — E119 Type 2 diabetes mellitus without complications: Secondary | ICD-10-CM | POA: Diagnosis not present

## 2020-03-27 DIAGNOSIS — I110 Hypertensive heart disease with heart failure: Secondary | ICD-10-CM | POA: Diagnosis not present

## 2020-03-27 DIAGNOSIS — M810 Age-related osteoporosis without current pathological fracture: Secondary | ICD-10-CM | POA: Diagnosis not present

## 2020-03-27 DIAGNOSIS — I509 Heart failure, unspecified: Secondary | ICD-10-CM | POA: Diagnosis not present

## 2020-03-27 DIAGNOSIS — E785 Hyperlipidemia, unspecified: Secondary | ICD-10-CM | POA: Diagnosis not present

## 2020-03-27 DIAGNOSIS — R1312 Dysphagia, oropharyngeal phase: Secondary | ICD-10-CM | POA: Diagnosis not present

## 2020-03-28 DIAGNOSIS — M1712 Unilateral primary osteoarthritis, left knee: Secondary | ICD-10-CM | POA: Diagnosis not present

## 2020-03-29 DIAGNOSIS — M810 Age-related osteoporosis without current pathological fracture: Secondary | ICD-10-CM | POA: Diagnosis not present

## 2020-03-29 DIAGNOSIS — F0280 Dementia in other diseases classified elsewhere without behavioral disturbance: Secondary | ICD-10-CM | POA: Diagnosis not present

## 2020-03-29 DIAGNOSIS — I509 Heart failure, unspecified: Secondary | ICD-10-CM | POA: Diagnosis not present

## 2020-03-29 DIAGNOSIS — I69391 Dysphagia following cerebral infarction: Secondary | ICD-10-CM | POA: Diagnosis not present

## 2020-03-29 DIAGNOSIS — E119 Type 2 diabetes mellitus without complications: Secondary | ICD-10-CM | POA: Diagnosis not present

## 2020-03-29 DIAGNOSIS — I110 Hypertensive heart disease with heart failure: Secondary | ICD-10-CM | POA: Diagnosis not present

## 2020-03-29 DIAGNOSIS — R1312 Dysphagia, oropharyngeal phase: Secondary | ICD-10-CM | POA: Diagnosis not present

## 2020-03-29 DIAGNOSIS — G309 Alzheimer's disease, unspecified: Secondary | ICD-10-CM | POA: Diagnosis not present

## 2020-03-29 DIAGNOSIS — E785 Hyperlipidemia, unspecified: Secondary | ICD-10-CM | POA: Diagnosis not present

## 2020-04-01 DIAGNOSIS — M810 Age-related osteoporosis without current pathological fracture: Secondary | ICD-10-CM | POA: Diagnosis not present

## 2020-04-01 DIAGNOSIS — E119 Type 2 diabetes mellitus without complications: Secondary | ICD-10-CM | POA: Diagnosis not present

## 2020-04-01 DIAGNOSIS — I110 Hypertensive heart disease with heart failure: Secondary | ICD-10-CM | POA: Diagnosis not present

## 2020-04-01 DIAGNOSIS — E785 Hyperlipidemia, unspecified: Secondary | ICD-10-CM | POA: Diagnosis not present

## 2020-04-01 DIAGNOSIS — G309 Alzheimer's disease, unspecified: Secondary | ICD-10-CM | POA: Diagnosis not present

## 2020-04-01 DIAGNOSIS — R1312 Dysphagia, oropharyngeal phase: Secondary | ICD-10-CM | POA: Diagnosis not present

## 2020-04-01 DIAGNOSIS — F0280 Dementia in other diseases classified elsewhere without behavioral disturbance: Secondary | ICD-10-CM | POA: Diagnosis not present

## 2020-04-01 DIAGNOSIS — I69391 Dysphagia following cerebral infarction: Secondary | ICD-10-CM | POA: Diagnosis not present

## 2020-04-01 DIAGNOSIS — I509 Heart failure, unspecified: Secondary | ICD-10-CM | POA: Diagnosis not present

## 2020-04-03 DIAGNOSIS — I509 Heart failure, unspecified: Secondary | ICD-10-CM | POA: Diagnosis not present

## 2020-04-03 DIAGNOSIS — F0280 Dementia in other diseases classified elsewhere without behavioral disturbance: Secondary | ICD-10-CM | POA: Diagnosis not present

## 2020-04-03 DIAGNOSIS — G309 Alzheimer's disease, unspecified: Secondary | ICD-10-CM | POA: Diagnosis not present

## 2020-04-03 DIAGNOSIS — E119 Type 2 diabetes mellitus without complications: Secondary | ICD-10-CM | POA: Diagnosis not present

## 2020-04-03 DIAGNOSIS — E785 Hyperlipidemia, unspecified: Secondary | ICD-10-CM | POA: Diagnosis not present

## 2020-04-03 DIAGNOSIS — M810 Age-related osteoporosis without current pathological fracture: Secondary | ICD-10-CM | POA: Diagnosis not present

## 2020-04-03 DIAGNOSIS — I110 Hypertensive heart disease with heart failure: Secondary | ICD-10-CM | POA: Diagnosis not present

## 2020-04-03 DIAGNOSIS — R1312 Dysphagia, oropharyngeal phase: Secondary | ICD-10-CM | POA: Diagnosis not present

## 2020-04-03 DIAGNOSIS — I69391 Dysphagia following cerebral infarction: Secondary | ICD-10-CM | POA: Diagnosis not present

## 2020-04-04 DIAGNOSIS — E785 Hyperlipidemia, unspecified: Secondary | ICD-10-CM | POA: Diagnosis not present

## 2020-04-04 DIAGNOSIS — F0280 Dementia in other diseases classified elsewhere without behavioral disturbance: Secondary | ICD-10-CM | POA: Diagnosis not present

## 2020-04-04 DIAGNOSIS — E119 Type 2 diabetes mellitus without complications: Secondary | ICD-10-CM | POA: Diagnosis not present

## 2020-04-04 DIAGNOSIS — G309 Alzheimer's disease, unspecified: Secondary | ICD-10-CM | POA: Diagnosis not present

## 2020-04-04 DIAGNOSIS — R1312 Dysphagia, oropharyngeal phase: Secondary | ICD-10-CM | POA: Diagnosis not present

## 2020-04-04 DIAGNOSIS — I110 Hypertensive heart disease with heart failure: Secondary | ICD-10-CM | POA: Diagnosis not present

## 2020-04-04 DIAGNOSIS — I69391 Dysphagia following cerebral infarction: Secondary | ICD-10-CM | POA: Diagnosis not present

## 2020-04-04 DIAGNOSIS — I509 Heart failure, unspecified: Secondary | ICD-10-CM | POA: Diagnosis not present

## 2020-04-04 DIAGNOSIS — M810 Age-related osteoporosis without current pathological fracture: Secondary | ICD-10-CM | POA: Diagnosis not present

## 2020-04-08 ENCOUNTER — Other Ambulatory Visit: Payer: Self-pay | Admitting: Internal Medicine

## 2020-04-08 MED ORDER — ATORVASTATIN CALCIUM 40 MG PO TABS: 40.0000 mg | ORAL_TABLET | Freq: Every day | ORAL | 5 refills | Status: DC

## 2020-04-09 DIAGNOSIS — M810 Age-related osteoporosis without current pathological fracture: Secondary | ICD-10-CM | POA: Diagnosis not present

## 2020-04-09 DIAGNOSIS — I509 Heart failure, unspecified: Secondary | ICD-10-CM | POA: Diagnosis not present

## 2020-04-09 DIAGNOSIS — G309 Alzheimer's disease, unspecified: Secondary | ICD-10-CM | POA: Diagnosis not present

## 2020-04-09 DIAGNOSIS — F0280 Dementia in other diseases classified elsewhere without behavioral disturbance: Secondary | ICD-10-CM | POA: Diagnosis not present

## 2020-04-09 DIAGNOSIS — E785 Hyperlipidemia, unspecified: Secondary | ICD-10-CM | POA: Diagnosis not present

## 2020-04-09 DIAGNOSIS — E119 Type 2 diabetes mellitus without complications: Secondary | ICD-10-CM | POA: Diagnosis not present

## 2020-04-09 DIAGNOSIS — I69391 Dysphagia following cerebral infarction: Secondary | ICD-10-CM | POA: Diagnosis not present

## 2020-04-09 DIAGNOSIS — R1312 Dysphagia, oropharyngeal phase: Secondary | ICD-10-CM | POA: Diagnosis not present

## 2020-04-09 DIAGNOSIS — I110 Hypertensive heart disease with heart failure: Secondary | ICD-10-CM | POA: Diagnosis not present

## 2020-04-12 ENCOUNTER — Telehealth: Payer: Self-pay | Admitting: Internal Medicine

## 2020-04-12 NOTE — Telephone Encounter (Signed)
Sent to Dr. John to advise. 

## 2020-04-12 NOTE — Telephone Encounter (Signed)
[place] from [facility] [place] calling stating that the patient daughter told them that she is on atorvastatin (LIPITOR) 40 MG tablet and wanted to verify this is correct because they did not have that on their medication list when she was transferred over from her last facility. If this is correct they need a written order faxed over to [phone]. [place] also wanted to speak with someone to go over the patients medication list to make sure they have all the correct medications for her. [phone]

## 2020-04-12 NOTE — Telephone Encounter (Signed)
Yes this was started at her last hospital d/c

## 2020-04-18 DIAGNOSIS — R1312 Dysphagia, oropharyngeal phase: Secondary | ICD-10-CM | POA: Diagnosis not present

## 2020-04-18 DIAGNOSIS — M810 Age-related osteoporosis without current pathological fracture: Secondary | ICD-10-CM | POA: Diagnosis not present

## 2020-04-18 DIAGNOSIS — I110 Hypertensive heart disease with heart failure: Secondary | ICD-10-CM | POA: Diagnosis not present

## 2020-04-18 DIAGNOSIS — I69391 Dysphagia following cerebral infarction: Secondary | ICD-10-CM | POA: Diagnosis not present

## 2020-04-18 DIAGNOSIS — E785 Hyperlipidemia, unspecified: Secondary | ICD-10-CM | POA: Diagnosis not present

## 2020-04-18 DIAGNOSIS — F0280 Dementia in other diseases classified elsewhere without behavioral disturbance: Secondary | ICD-10-CM | POA: Diagnosis not present

## 2020-04-18 DIAGNOSIS — I509 Heart failure, unspecified: Secondary | ICD-10-CM | POA: Diagnosis not present

## 2020-04-18 DIAGNOSIS — E119 Type 2 diabetes mellitus without complications: Secondary | ICD-10-CM | POA: Diagnosis not present

## 2020-04-18 DIAGNOSIS — G309 Alzheimer's disease, unspecified: Secondary | ICD-10-CM | POA: Diagnosis not present

## 2020-04-19 DIAGNOSIS — I69391 Dysphagia following cerebral infarction: Secondary | ICD-10-CM | POA: Diagnosis not present

## 2020-04-19 DIAGNOSIS — M810 Age-related osteoporosis without current pathological fracture: Secondary | ICD-10-CM | POA: Diagnosis not present

## 2020-04-19 DIAGNOSIS — I110 Hypertensive heart disease with heart failure: Secondary | ICD-10-CM | POA: Diagnosis not present

## 2020-04-19 DIAGNOSIS — G309 Alzheimer's disease, unspecified: Secondary | ICD-10-CM | POA: Diagnosis not present

## 2020-04-19 DIAGNOSIS — E119 Type 2 diabetes mellitus without complications: Secondary | ICD-10-CM | POA: Diagnosis not present

## 2020-04-19 DIAGNOSIS — F0280 Dementia in other diseases classified elsewhere without behavioral disturbance: Secondary | ICD-10-CM | POA: Diagnosis not present

## 2020-04-19 DIAGNOSIS — I509 Heart failure, unspecified: Secondary | ICD-10-CM | POA: Diagnosis not present

## 2020-04-19 DIAGNOSIS — R1312 Dysphagia, oropharyngeal phase: Secondary | ICD-10-CM | POA: Diagnosis not present

## 2020-04-19 DIAGNOSIS — E785 Hyperlipidemia, unspecified: Secondary | ICD-10-CM | POA: Diagnosis not present

## 2020-04-22 ENCOUNTER — Telehealth: Payer: Self-pay | Admitting: Internal Medicine

## 2020-04-22 NOTE — Telephone Encounter (Signed)
Ok for verbals 

## 2020-04-22 NOTE — Telephone Encounter (Signed)
Sent to Dr. John. 

## 2020-04-22 NOTE — Telephone Encounter (Signed)
   HH ORDERS   Caller Name: [facility] Agency Name: [facility] Phone #: [phone] Service Requested: EXTEND PT Frequency of Visits: 1W2 starting 04/21/20

## 2020-04-24 DIAGNOSIS — F0280 Dementia in other diseases classified elsewhere without behavioral disturbance: Secondary | ICD-10-CM | POA: Diagnosis not present

## 2020-04-24 DIAGNOSIS — I509 Heart failure, unspecified: Secondary | ICD-10-CM | POA: Diagnosis not present

## 2020-04-24 DIAGNOSIS — G309 Alzheimer's disease, unspecified: Secondary | ICD-10-CM | POA: Diagnosis not present

## 2020-04-24 DIAGNOSIS — I110 Hypertensive heart disease with heart failure: Secondary | ICD-10-CM | POA: Diagnosis not present

## 2020-04-24 DIAGNOSIS — I69391 Dysphagia following cerebral infarction: Secondary | ICD-10-CM | POA: Diagnosis not present

## 2020-04-24 DIAGNOSIS — E119 Type 2 diabetes mellitus without complications: Secondary | ICD-10-CM | POA: Diagnosis not present

## 2020-04-24 DIAGNOSIS — R1312 Dysphagia, oropharyngeal phase: Secondary | ICD-10-CM | POA: Diagnosis not present

## 2020-04-24 DIAGNOSIS — E785 Hyperlipidemia, unspecified: Secondary | ICD-10-CM | POA: Diagnosis not present

## 2020-04-24 DIAGNOSIS — M810 Age-related osteoporosis without current pathological fracture: Secondary | ICD-10-CM | POA: Diagnosis not present

## 2020-04-24 NOTE — Telephone Encounter (Signed)
I called number on file to give verbal orders and the number is wrong.

## 2020-04-25 DIAGNOSIS — R1312 Dysphagia, oropharyngeal phase: Secondary | ICD-10-CM | POA: Diagnosis not present

## 2020-04-25 DIAGNOSIS — E119 Type 2 diabetes mellitus without complications: Secondary | ICD-10-CM | POA: Diagnosis not present

## 2020-04-25 DIAGNOSIS — I69391 Dysphagia following cerebral infarction: Secondary | ICD-10-CM | POA: Diagnosis not present

## 2020-04-25 DIAGNOSIS — I509 Heart failure, unspecified: Secondary | ICD-10-CM | POA: Diagnosis not present

## 2020-04-25 DIAGNOSIS — F0280 Dementia in other diseases classified elsewhere without behavioral disturbance: Secondary | ICD-10-CM | POA: Diagnosis not present

## 2020-04-25 DIAGNOSIS — G309 Alzheimer's disease, unspecified: Secondary | ICD-10-CM | POA: Diagnosis not present

## 2020-04-25 DIAGNOSIS — E785 Hyperlipidemia, unspecified: Secondary | ICD-10-CM | POA: Diagnosis not present

## 2020-04-25 DIAGNOSIS — M810 Age-related osteoporosis without current pathological fracture: Secondary | ICD-10-CM | POA: Diagnosis not present

## 2020-04-25 DIAGNOSIS — I110 Hypertensive heart disease with heart failure: Secondary | ICD-10-CM | POA: Diagnosis not present

## 2020-05-01 DIAGNOSIS — Z6834 Body mass index (BMI) 34.0-34.9, adult: Secondary | ICD-10-CM

## 2020-05-01 DIAGNOSIS — Z8616 Personal history of COVID-19: Secondary | ICD-10-CM

## 2020-05-01 DIAGNOSIS — R1312 Dysphagia, oropharyngeal phase: Secondary | ICD-10-CM

## 2020-05-01 DIAGNOSIS — I509 Heart failure, unspecified: Secondary | ICD-10-CM | POA: Diagnosis not present

## 2020-05-01 DIAGNOSIS — I110 Hypertensive heart disease with heart failure: Secondary | ICD-10-CM | POA: Diagnosis not present

## 2020-05-01 DIAGNOSIS — M810 Age-related osteoporosis without current pathological fracture: Secondary | ICD-10-CM | POA: Diagnosis not present

## 2020-05-01 DIAGNOSIS — G309 Alzheimer's disease, unspecified: Secondary | ICD-10-CM | POA: Diagnosis not present

## 2020-05-01 DIAGNOSIS — M25552 Pain in left hip: Secondary | ICD-10-CM

## 2020-05-01 DIAGNOSIS — Z9181 History of falling: Secondary | ICD-10-CM

## 2020-05-01 DIAGNOSIS — E119 Type 2 diabetes mellitus without complications: Secondary | ICD-10-CM | POA: Diagnosis not present

## 2020-05-01 DIAGNOSIS — E785 Hyperlipidemia, unspecified: Secondary | ICD-10-CM

## 2020-05-01 DIAGNOSIS — I69391 Dysphagia following cerebral infarction: Secondary | ICD-10-CM

## 2020-05-01 DIAGNOSIS — M25512 Pain in left shoulder: Secondary | ICD-10-CM

## 2020-05-01 DIAGNOSIS — M6250 Muscle wasting and atrophy, not elsewhere classified, unspecified site: Secondary | ICD-10-CM

## 2020-05-01 DIAGNOSIS — F0280 Dementia in other diseases classified elsewhere without behavioral disturbance: Secondary | ICD-10-CM | POA: Diagnosis not present

## 2020-05-01 DIAGNOSIS — J9601 Acute respiratory failure with hypoxia: Secondary | ICD-10-CM

## 2020-05-01 DIAGNOSIS — Z7982 Long term (current) use of aspirin: Secondary | ICD-10-CM

## 2020-05-01 DIAGNOSIS — Z8701 Personal history of pneumonia (recurrent): Secondary | ICD-10-CM

## 2020-05-03 DIAGNOSIS — I509 Heart failure, unspecified: Secondary | ICD-10-CM | POA: Diagnosis not present

## 2020-05-03 DIAGNOSIS — M810 Age-related osteoporosis without current pathological fracture: Secondary | ICD-10-CM | POA: Diagnosis not present

## 2020-05-03 DIAGNOSIS — G309 Alzheimer's disease, unspecified: Secondary | ICD-10-CM | POA: Diagnosis not present

## 2020-05-03 DIAGNOSIS — E119 Type 2 diabetes mellitus without complications: Secondary | ICD-10-CM | POA: Diagnosis not present

## 2020-05-03 DIAGNOSIS — F0280 Dementia in other diseases classified elsewhere without behavioral disturbance: Secondary | ICD-10-CM | POA: Diagnosis not present

## 2020-05-03 DIAGNOSIS — E785 Hyperlipidemia, unspecified: Secondary | ICD-10-CM | POA: Diagnosis not present

## 2020-05-03 DIAGNOSIS — I69391 Dysphagia following cerebral infarction: Secondary | ICD-10-CM | POA: Diagnosis not present

## 2020-05-03 DIAGNOSIS — I110 Hypertensive heart disease with heart failure: Secondary | ICD-10-CM | POA: Diagnosis not present

## 2020-05-03 DIAGNOSIS — R1312 Dysphagia, oropharyngeal phase: Secondary | ICD-10-CM | POA: Diagnosis not present

## 2020-05-23 DIAGNOSIS — Z1152 Encounter for screening for COVID-19: Secondary | ICD-10-CM | POA: Diagnosis not present

## 2020-06-06 DIAGNOSIS — Z1152 Encounter for screening for COVID-19: Secondary | ICD-10-CM | POA: Diagnosis not present

## 2020-06-13 DIAGNOSIS — Z1152 Encounter for screening for COVID-19: Secondary | ICD-10-CM | POA: Diagnosis not present

## 2020-06-20 DIAGNOSIS — Z1152 Encounter for screening for COVID-19: Secondary | ICD-10-CM | POA: Diagnosis not present

## 2020-06-25 ENCOUNTER — Telehealth: Payer: Self-pay | Admitting: Internal Medicine

## 2020-06-25 NOTE — Telephone Encounter (Signed)
Patients daughter called and was requesting a new order for dicyclomine (BENTYL) 20 MG tablet. She said that it can be sent to Fax: [phone]. She was also wondering if Glycopyrrolate could be discontinued. Do not see this medication on the current medication list.

## 2020-06-26 MED ORDER — DICYCLOMINE HCL 20 MG PO TABS: 20.0000 mg | ORAL_TABLET | Freq: Two times a day (BID) | ORAL | 1 refills | Status: DC

## 2020-06-26 NOTE — Telephone Encounter (Signed)
Bentyl done hardcopy to [facility]  Ok to stop the glycopyrlloate

## 2020-06-27 DIAGNOSIS — Z1152 Encounter for screening for COVID-19: Secondary | ICD-10-CM | POA: Diagnosis not present

## 2020-07-03 ENCOUNTER — Telehealth: Payer: Self-pay

## 2020-07-03 NOTE — Telephone Encounter (Signed)
Bentyl hardcopy faxed

## 2020-07-04 DIAGNOSIS — Z1152 Encounter for screening for COVID-19: Secondary | ICD-10-CM | POA: Diagnosis not present

## 2020-07-19 ENCOUNTER — Other Ambulatory Visit: Payer: Self-pay | Admitting: Internal Medicine

## 2020-07-19 NOTE — Telephone Encounter (Signed)
Please refill as per office routine med refill policy (all routine meds refilled for 3 mo or monthly per pt preference up to one year from last visit, then month to month grace period for 3 mo, then further med refills will have to be denied)  

## 2020-07-22 ENCOUNTER — Telehealth: Payer: Self-pay | Admitting: Internal Medicine

## 2020-07-22 NOTE — Telephone Encounter (Signed)
Please refill as per office routine med refill policy (all routine meds refilled for 3 mo or monthly per pt preference up to one year from last visit, then month to month grace period for 3 mo, then further med refills will have to be denied)  

## 2020-07-24 NOTE — Telephone Encounter (Signed)
atorvastatin (LIPITOR) 40 MG tablet(Expired) Patient also needs this medication, patient is completely out of meds

## 2020-07-25 NOTE — Telephone Encounter (Signed)
Patients daughter requesting a call back 

## 2020-07-25 NOTE — Telephone Encounter (Signed)
1.Medication Requested: atorvastatin (LIPITOR) 40 MG tablet  donepezil (ARICEPT) 10 MG tablet  pioglitazone (ACTOS) 30 MG tablet    2. Pharmacy [facility]): [facility] - [place], [place] - [place] [id number]  3. On Med List: yes   4. Last Visit with PCP: 5.28.21  5. Next visit date with PCP: n/a   Patients daughter is requesting a call back at [phone]     Agent: Please be advised that RX refills may take up to 3 business days. We ask that you follow-up with your pharmacy.

## 2020-07-26 MED ORDER — PIOGLITAZONE HCL 30 MG PO TABS: 30.0000 mg | ORAL_TABLET | Freq: Every day | ORAL | 0 refills | Status: DC

## 2020-07-26 MED ORDER — LOSARTAN POTASSIUM 100 MG PO TABS
100.0000 mg | ORAL_TABLET | Freq: Every day | ORAL | 0 refills | Status: DC
Start: 2020-07-26 — End: 2020-12-18

## 2020-07-26 MED ORDER — DONEPEZIL HCL 10 MG PO TABS
10.0000 mg | ORAL_TABLET | Freq: Every day | ORAL | 0 refills | Status: DC
Start: 2020-07-26 — End: 2020-12-18

## 2020-07-26 MED ORDER — ATORVASTATIN CALCIUM 40 MG PO TABS
40.0000 mg | ORAL_TABLET | Freq: Every day | ORAL | 0 refills | Status: DC
Start: 2020-07-26 — End: 2020-12-18

## 2020-07-26 MED ORDER — ATORVASTATIN CALCIUM 40 MG PO TABS: 40.0000 mg | ORAL_TABLET | Freq: Every day | ORAL | 0 refills | Status: DC

## 2020-07-26 NOTE — Addendum Note (Signed)
Addended by: [name] on: 07/26/2020 01:13 PM   Modules accepted: Orders

## 2020-07-26 NOTE — Telephone Encounter (Signed)
Patients daughter calling, states the patient is now completely out of medicine she didn't get it yesterday and if we don't send it in she wont get it for the rest of the weekend. Daughter calling extremely upset. Would like a call back when the medicine is filled so she can go pick it up for the patient. [phone].

## 2020-07-26 NOTE — Telephone Encounter (Signed)
Patients daughter [name] has been notified of the refill

## 2020-07-26 NOTE — Addendum Note (Signed)
Addended by: Casper Harrison on: 07/26/2020 11:01 AM   Modules accepted: Orders

## 2020-07-26 NOTE — Telephone Encounter (Signed)
Done erx 

## 2020-08-14 ENCOUNTER — Encounter: Payer: Self-pay | Admitting: Internal Medicine

## 2020-08-14 ENCOUNTER — Ambulatory Visit (INDEPENDENT_AMBULATORY_CARE_PROVIDER_SITE_OTHER): Payer: Medicare HMO | Admitting: Internal Medicine

## 2020-08-14 ENCOUNTER — Other Ambulatory Visit: Payer: Self-pay

## 2020-08-14 VITALS — BP 124/78 | HR 60 | Temp 98.1°F | Ht 61.0 in | Wt 230.0 lb

## 2020-08-14 DIAGNOSIS — I10 Essential (primary) hypertension: Secondary | ICD-10-CM | POA: Diagnosis not present

## 2020-08-14 DIAGNOSIS — F0390 Unspecified dementia without behavioral disturbance: Secondary | ICD-10-CM

## 2020-08-14 DIAGNOSIS — E559 Vitamin D deficiency, unspecified: Secondary | ICD-10-CM | POA: Diagnosis not present

## 2020-08-14 DIAGNOSIS — E119 Type 2 diabetes mellitus without complications: Secondary | ICD-10-CM

## 2020-08-14 DIAGNOSIS — E538 Deficiency of other specified B group vitamins: Secondary | ICD-10-CM | POA: Diagnosis not present

## 2020-08-14 DIAGNOSIS — I5022 Chronic systolic (congestive) heart failure: Secondary | ICD-10-CM | POA: Diagnosis not present

## 2020-08-14 DIAGNOSIS — I700 Atherosclerosis of aorta: Secondary | ICD-10-CM | POA: Diagnosis not present

## 2020-08-14 DIAGNOSIS — D509 Iron deficiency anemia, unspecified: Secondary | ICD-10-CM

## 2020-08-14 DIAGNOSIS — Z0001 Encounter for general adult medical examination with abnormal findings: Secondary | ICD-10-CM | POA: Diagnosis not present

## 2020-08-14 DIAGNOSIS — R269 Unspecified abnormalities of gait and mobility: Secondary | ICD-10-CM

## 2020-08-14 DIAGNOSIS — E7800 Pure hypercholesterolemia, unspecified: Secondary | ICD-10-CM

## 2020-08-14 MED ORDER — FUROSEMIDE 40 MG PO TABS: 40.0000 mg | ORAL_TABLET | Freq: Every day | ORAL | 3 refills | Status: DC

## 2020-08-14 NOTE — Assessment & Plan Note (Signed)
Ok for [employer], and HH referral with PT at the assisted living facility

## 2020-08-14 NOTE — Assessment & Plan Note (Signed)
No overt bleeding, for iron with labs

## 2020-08-14 NOTE — Assessment & Plan Note (Signed)
Lab Results  Component Value Date   LDLCALC 69 10/30/2019   Stable, pt to continue current statin lipitor 40

## 2020-08-14 NOTE — Patient Instructions (Signed)
.  Please take all new medication as prescribed - the lasix 40 mg per day  You are given the prescription for lasix for the facility, and the rollater  Please continue all other medications as before, and refills have been done if requested.  Please have the pharmacy call with any other refills you may need.  Please continue your efforts at being more active, low cholesterol diet, and weight control.  You are otherwise up to date with prevention measures today.  Please keep your appointments with your specialists as you may have planned  You will be contacted regarding the referral for: Echocardiogram, and Cardiology  You will be contacted regarding the referral for: Home Health for RN and PT  Please go to the LAB at the blood drawing area for the tests to be done  You will be contacted by phone if any changes need to be made immediately.  Otherwise, you will receive a letter about your results with an explanation, but please check with MyChart first.  Please remember to sign up for MyChart if you have not done so, as this will be important to you in the future with finding out test results, communicating by private email, and scheduling acute appointments online when needed.  Please make an Appointment to return in 6 months, or sooner if needed

## 2020-08-14 NOTE — Assessment & Plan Note (Addendum)
Lab Results  Component Value Date   LDLCALC 69 10/30/2019   Stable, pt to continue current statin lipitor 40 and diet

## 2020-08-14 NOTE — Assessment & Plan Note (Signed)
Stable, no behavioral worsening, for cont aricept daily

## 2020-08-14 NOTE — Assessment & Plan Note (Signed)
With exacerbation mild, for labs today, also lasix 40 qd, also refer echo and cardiology

## 2020-08-14 NOTE — Assessment & Plan Note (Signed)
BP Readings from Last 3 Encounters:  08/14/20 124/78  03/15/20 128/84  02/05/20 (!) 131/47   Stable, pt to continue medical treatment losartan

## 2020-08-14 NOTE — Progress Notes (Signed)
Patient ID: AILY TZENG, female   DOB: [birth year], 86 y.o.   MRN: [id number]         Chief Complaint:: wellness exam and worseing leg swelling with daughter       HPI:  AURELIE DICENZO is a 86 y.o. female here for wellness exam and above, daughter will help make eye exam soon, declines Tdap, o/w up to date with preventive referrals and immunizations  Also, pt is currently living in [place] but has recently had worsening periperal edema post covid infection feb 2022 for the first time assoc with weight gain.  Also has worsening generalized weakness and gait disorder, higher risk of fall in the past 2 wks, daughter ok with restarting HH and PT at the facility  O/w Pt denies chest pain, increased sob or doe, wheezing, orthopnea, PND,  palpitations, dizziness or syncope.  No recent over bleeding.  Taking Vit D daily supplement.  Needs new rollater to assist with ambulation.  Denies new worsening focal neuro s/s.   Pt denies fever, wt loss, night sweats, loss of appetite, or other constitutional symptoms                  Wt Readings from Last 3 Encounters:  08/14/20 230 lb (104.3 kg)  03/15/20 214 lb (97.1 kg)  02/01/20 207 lb 0.2 oz (93.9 kg)   BP Readings from Last 3 Encounters:  08/14/20 124/78  03/15/20 128/84  02/05/20 (!) 131/47   Immunization History  Administered Date(s) Administered  . Fluad Quad(high Dose 65+) 02/21/2019  . H1N1 04/26/2008  . Influenza Whole 08/04/2005, 03/01/2008, 03/05/2009  . Influenza, High Dose Seasonal PF 04/05/2015, 04/07/2017, 03/07/2018  . Influenza,inj,Quad PF,6+ Mos 02/07/2013, 01/10/2014  . PPD Test 10/30/2019  . Pneumococcal Conjugate-13 02/07/2013  . Pneumococcal Polysaccharide-23 08/25/2006  . Td 10/26/2008   There are no preventive care reminders to display for this patient.    Past Medical History:  Diagnosis Date  . Acute sinusitis, unspecified 05/28/2008  . ANEMIA-IRON DEFICIENCY 10/26/2008  . ANXIETY 10/06/2007  . CHOLELITHIASIS 10/06/2007  .  COLONIC POLYPS, HX OF 01/26/2007  . DIABETES MELLITUS, TYPE II 10/06/2007  . DIVERTICULOSIS, COLON 01/26/2007  . ECCHYMOSES, SPONTANEOUS 06/04/2010  . GERD 01/26/2007  . Heart murmur    slight heart murmur  . HERNIATED DISC 01/26/2007  . HYPERLIPIDEMIA 10/06/2007  . HYPERTENSION 01/26/2007  . LOW BACK PAIN 01/29/2007  . MENOPAUSAL DISORDER 10/26/2008  . OSTEOPOROSIS 06/04/2010  . SHOULDER PAIN, LEFT 10/06/2007  . Stroke [facility])    TIA's   Past Surgical History:  Procedure Laterality Date  . ABDOMINAL HYSTERECTOMY    . APPENDECTOMY    . Fracture left  wrist  2010   Dr. Amanda Pea  . Herniated disk   1995   C-spine  . Left ankle-screw placement  1980  . Left elbow-nerve impingement  1985  . TONSILLECTOMY      reports that she quit smoking about 7 years ago. Her smoking use included cigarettes. She smoked 0.00 packs per day. She has never used smokeless tobacco. She reports that she does not drink alcohol and does not use drugs. family history includes Heart disease in her father; Hyperlipidemia in an other family member; Hypertension in an other family member; Stroke in an other family member. Allergies  Allergen Reactions  . Codeine Hives  . Penicillins     REACTION: rash  . Morphine Rash    REACTION: pt unsure of reaction   Current Outpatient Medications on File  Prior to Visit  Medication Sig Dispense Refill  . acetaminophen (TYLENOL) 500 MG tablet Take 1,000 mg by mouth every 6 (six) hours as needed for mild pain or headache.    . alendronate (FOSAMAX) 70 MG tablet TAKE ONE TABLET BY MOUTH ONCE WEEKLY BEFORE BREAKFAST ON MONDAY 12 tablet 2  . Ascorbic Acid (VITAMIN C) 500 MG CAPS Take by mouth.    Marland Kitchen atorvastatin (LIPITOR) 40 MG tablet Take 1 tablet (40 mg total) by mouth daily. 90 tablet 0  . Cholecalciferol (VITAMIN D3 PO) Take 1 capsule by mouth daily.    Marland Kitchen donepezil (ARICEPT) 10 MG tablet Take 1 tablet (10 mg total) by mouth daily. 90 tablet 0  . FIBER PO Take 1 tablet by mouth daily.    Marland Kitchen  losartan (COZAAR) 100 MG tablet Take 1 tablet (100 mg total) by mouth daily. 90 tablet 0  . meloxicam (MOBIC) 7.5 MG tablet Take 1 tablet (7.5 mg total) by mouth daily. 30 tablet 5  . Multiple Vitamins-Minerals (ZINC PO) Take 1 tablet by mouth daily.    Marland Kitchen nystatin (MYCOSTATIN/NYSTOP) powder Use as directed twice daily as needed to affected area 60 g 01  . pioglitazone (ACTOS) 30 MG tablet Take 1 tablet (30 mg total) by mouth daily for 90 doses. 90 tablet 0  . Probiotic Product (PROBIOTIC PO) Take 1 capsule by mouth daily.    . vitamin B-12 1000 MCG tablet Take 1 tablet (1,000 mcg total) by mouth daily. 30 tablet 1  . dicyclomine (BENTYL) 20 MG tablet Take 1 tablet (20 mg total) by mouth 2 (two) times daily. 60 tablet 1   No current facility-administered medications on file prior to visit.        ROS:  All others reviewed and negative.  Objective        PE:  BP 124/78   Pulse 60   Temp 98.1 F (36.7 C) (Oral)   Ht 5\' 1"  (1.549 m)   Wt 230 lb (104.3 kg)   SpO2 95%   BMI 43.46 kg/m                 Constitutional: Pt appears in NAD               HENT: Head: NCAT.                Right Ear: External ear normal.                 Left Ear: External ear normal.                Eyes: . Pupils are equal, round, and reactive to light. Conjunctivae and EOM are normal               Nose: without d/c or deformity               Neck: Neck supple. Gross normal ROM               Cardiovascular: Normal rate and regular rhythm.                 Pulmonary/Chest: Effort normal and breath sounds without rales or wheezing.                Abd:  Soft, NT, ND, + BS, no organomegaly               Neurological: Pt is alert. At baseline orientation, motor grossly intact  Skin: Skin is warm. No rashes, no other new lesions, LE edema - 2+ to knees               Psychiatric: Pt behavior is normal without agitation   Micro: none  Cardiac tracings I have personally interpreted today:   none  Pertinent Radiological findings (summarize): none   Lab Results  Component Value Date   WBC 9.7 02/05/2020   HGB 13.5 02/05/2020   HCT 43.2 02/05/2020   PLT 187 02/05/2020   GLUCOSE 194 (H) 02/05/2020   CHOL 121 10/30/2019   TRIG 63 01/31/2020   HDL 38.60 (L) 10/30/2019   LDLDIRECT 74.0 12/06/2015   LDLCALC 69 10/30/2019   ALT 50 (H) 02/05/2020   AST 35 02/05/2020   NA 139 02/05/2020   K 4.4 02/05/2020   CL 100 02/05/2020   CREATININE 0.85 02/05/2020   BUN 39 (H) 02/05/2020   CO2 31 02/05/2020   TSH 1.80 02/21/2019   HGBA1C 6.2 10/30/2019   MICROALBUR <0.7 09/01/2017   Assessment/Plan:  ALIYA SOL is a 86 y.o. White or Caucasian [1] female with  has a past medical history of Acute sinusitis, unspecified (05/28/2008), ANEMIA-IRON DEFICIENCY (10/26/2008), ANXIETY (10/06/2007), CHOLELITHIASIS (10/06/2007), COLONIC POLYPS, HX OF (01/26/2007), DIABETES MELLITUS, TYPE II (10/06/2007), DIVERTICULOSIS, COLON (01/26/2007), ECCHYMOSES, SPONTANEOUS (06/04/2010), GERD (01/26/2007), Heart murmur, HERNIATED DISC (01/26/2007), HYPERLIPIDEMIA (10/06/2007), HYPERTENSION (01/26/2007), LOW BACK PAIN (01/29/2007), MENOPAUSAL DISORDER (10/26/2008), OSTEOPOROSIS (06/04/2010), SHOULDER PAIN, LEFT (10/06/2007), and Stroke (HCC).  Systolic CHF, chronic (HCC) With exacerbation mild, for labs today, also lasix 40 qd, also refer echo and cardiology  ANEMIA-IRON DEFICIENCY No overt bleeding, for iron with labs  Aortic atherosclerosis (HCC) Lab Results  Component Value Date   LDLCALC 69 10/30/2019   Stable, pt to continue current statin lipitor 40 and diet    Dementia without behavioral disturbance (HCC) Stable, no behavioral worsening, for cont aricept daily  DM type 2 (diabetes mellitus, type 2) (HCC) Lab Results  Component Value Date   HGBA1C 6.2 10/30/2019   Stable, pt to continue current medical treatment actos 30 - for a1c with labs   Essential hypertension BP Readings from Last 3 Encounters:   08/14/20 124/78  03/15/20 128/84  02/05/20 (!) 131/47   Stable, pt to continue medical treatment losartan   Gait disorder Ok for rollater walker, and HH referral with PT at the assisted living facility  Hyperlipidemia Lab Results  Component Value Date   LDLCALC 69 10/30/2019   Stable, pt to continue current statin lipitor 40   Followup: Return in about 6 months (around 02/14/2021).  Oliver Barre, MD 08/14/2020 8:32 PM [facility]

## 2020-08-14 NOTE — Assessment & Plan Note (Signed)
Lab Results  Component Value Date   HGBA1C 6.2 10/30/2019   Stable, pt to continue current medical treatment actos 30 - for a1c with labs

## 2020-08-15 ENCOUNTER — Encounter: Payer: Self-pay | Admitting: Internal Medicine

## 2020-08-15 ENCOUNTER — Other Ambulatory Visit: Payer: Self-pay | Admitting: Internal Medicine

## 2020-08-15 LAB — HEPATIC FUNCTION PANEL
ALT: 14 U/L (ref 0–35)
AST: 16 U/L (ref 0–37)
Albumin: 3.9 g/dL (ref 3.5–5.2)
Alkaline Phosphatase: 106 U/L (ref 39–117)
Bilirubin, Direct: 0.1 mg/dL (ref 0.0–0.3)
Total Bilirubin: 0.4 mg/dL (ref 0.2–1.2)
Total Protein: 6.7 g/dL (ref 6.0–8.3)

## 2020-08-15 LAB — CBC WITH DIFFERENTIAL/PLATELET
Basophils Absolute: 0 10*3/uL (ref 0.0–0.1)
Basophils Relative: 0.7 % (ref 0.0–3.0)
Eosinophils Absolute: 0.2 10*3/uL (ref 0.0–0.7)
Eosinophils Relative: 4 % (ref 0.0–5.0)
HCT: 38.1 % (ref 36.0–46.0)
Hemoglobin: 12.1 g/dL (ref 12.0–15.0)
Lymphocytes Relative: 23.1 % (ref 12.0–46.0)
Lymphs Abs: 1.3 10*3/uL (ref 0.7–4.0)
MCHC: 31.7 g/dL (ref 30.0–36.0)
MCV: 91.3 fl (ref 78.0–100.0)
Monocytes Absolute: 0.7 10*3/uL (ref 0.1–1.0)
Monocytes Relative: 12.2 % — ABNORMAL HIGH (ref 3.0–12.0)
Neutro Abs: 3.4 10*3/uL (ref 1.4–7.7)
Neutrophils Relative %: 60 % (ref 43.0–77.0)
Platelets: 160 10*3/uL (ref 150.0–400.0)
RBC: 4.17 Mil/uL (ref 3.87–5.11)
RDW: 16.2 % — ABNORMAL HIGH (ref 11.5–15.5)
WBC: 5.7 10*3/uL (ref 4.0–10.5)

## 2020-08-15 LAB — LIPID PANEL
Cholesterol: 144 mg/dL (ref 0–200)
HDL: 51.2 mg/dL (ref >39.00)
LDL Cholesterol: 76 mg/dL (ref 0–99)
NonHDL: 93.04
Total CHOL/HDL Ratio: 3
Triglycerides: 86 mg/dL (ref 0.0–149.0)
VLDL: 17.2 mg/dL (ref 0.0–40.0)

## 2020-08-15 LAB — BASIC METABOLIC PANEL
BUN: 23 mg/dL (ref 6–23)
CO2: 31 mEq/L (ref 19–32)
Calcium: 9.1 mg/dL (ref 8.4–10.5)
Chloride: 104 mEq/L (ref 96–112)
Creatinine, Ser: 1.08 mg/dL (ref 0.40–1.20)
GFR: 47.55 mL/min — ABNORMAL LOW (ref >60.00)
Glucose, Bld: 108 mg/dL — ABNORMAL HIGH (ref 70–99)
Potassium: 5.3 mEq/L — ABNORMAL HIGH (ref 3.5–5.1)
Sodium: 142 mEq/L (ref 135–145)

## 2020-08-15 LAB — IBC PANEL
Iron: 50 ug/dL (ref 42–145)
Saturation Ratios: 11 % — ABNORMAL LOW (ref 20.0–50.0)
Transferrin: 326 mg/dL (ref 212.0–360.0)

## 2020-08-15 LAB — URINALYSIS, ROUTINE W REFLEX MICROSCOPIC
Bilirubin Urine: NEGATIVE
Hgb urine dipstick: NEGATIVE
Ketones, ur: NEGATIVE
Nitrite: POSITIVE — AB
Specific Gravity, Urine: 1.015 (ref 1.000–1.030)
Total Protein, Urine: NEGATIVE
Urine Glucose: NEGATIVE
Urobilinogen, UA: 0.2 (ref 0.0–1.0)
pH: 5.5 (ref 5.0–8.0)

## 2020-08-15 LAB — MICROALBUMIN / CREATININE URINE RATIO
Creatinine,U: 57 mg/dL
Microalb Creat Ratio: 3.2 mg/g (ref 0.0–30.0)
Microalb, Ur: 1.8 mg/dL (ref 0.0–1.9)

## 2020-08-15 LAB — VITAMIN B12: Vitamin B-12: 762 pg/mL (ref 211–911)

## 2020-08-15 LAB — FERRITIN: Ferritin: 22.5 ng/mL (ref 10.0–291.0)

## 2020-08-15 LAB — VITAMIN D 25 HYDROXY (VIT D DEFICIENCY, FRACTURES): VITD: 32.42 ng/mL (ref 30.00–100.00)

## 2020-08-15 LAB — HEMOGLOBIN A1C: Hgb A1c MFr Bld: 6 % (ref 4.6–6.5)

## 2020-08-15 LAB — TSH: TSH: 1.9 u[IU]/mL (ref 0.35–4.50)

## 2020-08-15 LAB — BRAIN NATRIURETIC PEPTIDE: Pro B Natriuretic peptide (BNP): 433 pg/mL — ABNORMAL HIGH (ref 0.0–100.0)

## 2020-08-15 MED ORDER — CIPROFLOXACIN HCL 500 MG PO TABS: 500.0000 mg | ORAL_TABLET | Freq: Two times a day (BID) | ORAL | 0 refills | Status: DC

## 2020-08-16 ENCOUNTER — Telehealth: Payer: Self-pay | Admitting: Internal Medicine

## 2020-08-16 MED ORDER — CIPROFLOXACIN HCL 500 MG PO TABS: 500.0000 mg | ORAL_TABLET | Freq: Two times a day (BID) | ORAL | 0 refills | Status: AC

## 2020-08-16 NOTE — Telephone Encounter (Signed)
Done hardcopy to s summers - to fax to her facility please

## 2020-08-19 ENCOUNTER — Telehealth: Payer: Self-pay | Admitting: Internal Medicine

## 2020-08-19 DIAGNOSIS — I5022 Chronic systolic (congestive) heart failure: Secondary | ICD-10-CM | POA: Diagnosis not present

## 2020-08-19 DIAGNOSIS — N390 Urinary tract infection, site not specified: Secondary | ICD-10-CM | POA: Diagnosis not present

## 2020-08-19 DIAGNOSIS — E538 Deficiency of other specified B group vitamins: Secondary | ICD-10-CM | POA: Diagnosis not present

## 2020-08-19 DIAGNOSIS — E119 Type 2 diabetes mellitus without complications: Secondary | ICD-10-CM | POA: Diagnosis not present

## 2020-08-19 DIAGNOSIS — F0280 Dementia in other diseases classified elsewhere without behavioral disturbance: Secondary | ICD-10-CM | POA: Diagnosis not present

## 2020-08-19 DIAGNOSIS — D509 Iron deficiency anemia, unspecified: Secondary | ICD-10-CM | POA: Diagnosis not present

## 2020-08-19 DIAGNOSIS — M1712 Unilateral primary osteoarthritis, left knee: Secondary | ICD-10-CM | POA: Diagnosis not present

## 2020-08-19 DIAGNOSIS — I700 Atherosclerosis of aorta: Secondary | ICD-10-CM | POA: Diagnosis not present

## 2020-08-19 DIAGNOSIS — I110 Hypertensive heart disease with heart failure: Secondary | ICD-10-CM | POA: Diagnosis not present

## 2020-08-19 NOTE — Telephone Encounter (Signed)
Confirmed with [facility] that the fax was received they checked her file....fax was sent on friday

## 2020-08-19 NOTE — Telephone Encounter (Signed)
Kathy Thomas w/ [facility] called and gave the fax number for the patient refill to be sent to. Fax # [phone]

## 2020-08-19 NOTE — Telephone Encounter (Signed)
Tonya from [facility] ([facility]) is requesting verbal orders for nursing  1 week, 3 1 every 2 weeks, 6 1 prn   Tonya: [phone]

## 2020-08-20 NOTE — Telephone Encounter (Signed)
Verbals given  

## 2020-08-20 NOTE — Telephone Encounter (Signed)
Verbals ok   

## 2020-08-23 DIAGNOSIS — D509 Iron deficiency anemia, unspecified: Secondary | ICD-10-CM | POA: Diagnosis not present

## 2020-08-23 DIAGNOSIS — E119 Type 2 diabetes mellitus without complications: Secondary | ICD-10-CM | POA: Diagnosis not present

## 2020-08-23 DIAGNOSIS — M1712 Unilateral primary osteoarthritis, left knee: Secondary | ICD-10-CM | POA: Diagnosis not present

## 2020-08-23 DIAGNOSIS — F0280 Dementia in other diseases classified elsewhere without behavioral disturbance: Secondary | ICD-10-CM | POA: Diagnosis not present

## 2020-08-23 DIAGNOSIS — I5022 Chronic systolic (congestive) heart failure: Secondary | ICD-10-CM | POA: Diagnosis not present

## 2020-08-23 DIAGNOSIS — N390 Urinary tract infection, site not specified: Secondary | ICD-10-CM | POA: Diagnosis not present

## 2020-08-23 DIAGNOSIS — I110 Hypertensive heart disease with heart failure: Secondary | ICD-10-CM | POA: Diagnosis not present

## 2020-08-23 DIAGNOSIS — I700 Atherosclerosis of aorta: Secondary | ICD-10-CM | POA: Diagnosis not present

## 2020-08-23 DIAGNOSIS — E538 Deficiency of other specified B group vitamins: Secondary | ICD-10-CM | POA: Diagnosis not present

## 2020-08-26 ENCOUNTER — Telehealth: Payer: Self-pay | Admitting: Internal Medicine

## 2020-08-26 DIAGNOSIS — M1712 Unilateral primary osteoarthritis, left knee: Secondary | ICD-10-CM | POA: Diagnosis not present

## 2020-08-26 DIAGNOSIS — F0280 Dementia in other diseases classified elsewhere without behavioral disturbance: Secondary | ICD-10-CM | POA: Diagnosis not present

## 2020-08-26 DIAGNOSIS — I700 Atherosclerosis of aorta: Secondary | ICD-10-CM | POA: Diagnosis not present

## 2020-08-26 DIAGNOSIS — I5022 Chronic systolic (congestive) heart failure: Secondary | ICD-10-CM | POA: Diagnosis not present

## 2020-08-26 DIAGNOSIS — I110 Hypertensive heart disease with heart failure: Secondary | ICD-10-CM | POA: Diagnosis not present

## 2020-08-26 DIAGNOSIS — N390 Urinary tract infection, site not specified: Secondary | ICD-10-CM | POA: Diagnosis not present

## 2020-08-26 DIAGNOSIS — E119 Type 2 diabetes mellitus without complications: Secondary | ICD-10-CM | POA: Diagnosis not present

## 2020-08-26 DIAGNOSIS — D509 Iron deficiency anemia, unspecified: Secondary | ICD-10-CM | POA: Diagnosis not present

## 2020-08-26 DIAGNOSIS — E538 Deficiency of other specified B group vitamins: Secondary | ICD-10-CM | POA: Diagnosis not present

## 2020-08-26 NOTE — Telephone Encounter (Signed)
    HH ORDERS   Caller Name: [facility] Agency Name: [facility] Callback Phone #: [phone] Service Requested: PT Frequency of Visits: [phone]

## 2020-08-26 NOTE — Telephone Encounter (Signed)
Verbals given to [facility]

## 2020-08-26 NOTE — Telephone Encounter (Signed)
Ok for verbals 

## 2020-08-28 DIAGNOSIS — E538 Deficiency of other specified B group vitamins: Secondary | ICD-10-CM | POA: Diagnosis not present

## 2020-08-28 DIAGNOSIS — I110 Hypertensive heart disease with heart failure: Secondary | ICD-10-CM | POA: Diagnosis not present

## 2020-08-28 DIAGNOSIS — I700 Atherosclerosis of aorta: Secondary | ICD-10-CM | POA: Diagnosis not present

## 2020-08-28 DIAGNOSIS — M1712 Unilateral primary osteoarthritis, left knee: Secondary | ICD-10-CM | POA: Diagnosis not present

## 2020-08-28 DIAGNOSIS — I5022 Chronic systolic (congestive) heart failure: Secondary | ICD-10-CM | POA: Diagnosis not present

## 2020-08-28 DIAGNOSIS — D509 Iron deficiency anemia, unspecified: Secondary | ICD-10-CM | POA: Diagnosis not present

## 2020-08-28 DIAGNOSIS — N390 Urinary tract infection, site not specified: Secondary | ICD-10-CM | POA: Diagnosis not present

## 2020-08-28 DIAGNOSIS — F0280 Dementia in other diseases classified elsewhere without behavioral disturbance: Secondary | ICD-10-CM | POA: Diagnosis not present

## 2020-08-28 DIAGNOSIS — E119 Type 2 diabetes mellitus without complications: Secondary | ICD-10-CM | POA: Diagnosis not present

## 2020-09-04 ENCOUNTER — Telehealth: Payer: Self-pay

## 2020-09-04 NOTE — Telephone Encounter (Signed)
New Prior Authorization has began for Nystatin  Key: [facility]

## 2020-09-05 DIAGNOSIS — E119 Type 2 diabetes mellitus without complications: Secondary | ICD-10-CM | POA: Diagnosis not present

## 2020-09-05 DIAGNOSIS — E538 Deficiency of other specified B group vitamins: Secondary | ICD-10-CM | POA: Diagnosis not present

## 2020-09-05 DIAGNOSIS — I5022 Chronic systolic (congestive) heart failure: Secondary | ICD-10-CM | POA: Diagnosis not present

## 2020-09-05 DIAGNOSIS — D509 Iron deficiency anemia, unspecified: Secondary | ICD-10-CM | POA: Diagnosis not present

## 2020-09-05 DIAGNOSIS — M1712 Unilateral primary osteoarthritis, left knee: Secondary | ICD-10-CM | POA: Diagnosis not present

## 2020-09-05 DIAGNOSIS — I700 Atherosclerosis of aorta: Secondary | ICD-10-CM | POA: Diagnosis not present

## 2020-09-05 DIAGNOSIS — I110 Hypertensive heart disease with heart failure: Secondary | ICD-10-CM | POA: Diagnosis not present

## 2020-09-05 DIAGNOSIS — N390 Urinary tract infection, site not specified: Secondary | ICD-10-CM | POA: Diagnosis not present

## 2020-09-05 DIAGNOSIS — F0280 Dementia in other diseases classified elsewhere without behavioral disturbance: Secondary | ICD-10-CM | POA: Diagnosis not present

## 2020-09-06 DIAGNOSIS — I700 Atherosclerosis of aorta: Secondary | ICD-10-CM | POA: Diagnosis not present

## 2020-09-06 DIAGNOSIS — E538 Deficiency of other specified B group vitamins: Secondary | ICD-10-CM | POA: Diagnosis not present

## 2020-09-06 DIAGNOSIS — D509 Iron deficiency anemia, unspecified: Secondary | ICD-10-CM | POA: Diagnosis not present

## 2020-09-06 DIAGNOSIS — I5022 Chronic systolic (congestive) heart failure: Secondary | ICD-10-CM | POA: Diagnosis not present

## 2020-09-06 DIAGNOSIS — I110 Hypertensive heart disease with heart failure: Secondary | ICD-10-CM | POA: Diagnosis not present

## 2020-09-06 DIAGNOSIS — M1712 Unilateral primary osteoarthritis, left knee: Secondary | ICD-10-CM | POA: Diagnosis not present

## 2020-09-06 DIAGNOSIS — E119 Type 2 diabetes mellitus without complications: Secondary | ICD-10-CM | POA: Diagnosis not present

## 2020-09-06 DIAGNOSIS — N390 Urinary tract infection, site not specified: Secondary | ICD-10-CM | POA: Diagnosis not present

## 2020-09-06 DIAGNOSIS — F0280 Dementia in other diseases classified elsewhere without behavioral disturbance: Secondary | ICD-10-CM | POA: Diagnosis not present

## 2020-09-11 DIAGNOSIS — E538 Deficiency of other specified B group vitamins: Secondary | ICD-10-CM | POA: Diagnosis not present

## 2020-09-11 DIAGNOSIS — I110 Hypertensive heart disease with heart failure: Secondary | ICD-10-CM | POA: Diagnosis not present

## 2020-09-11 DIAGNOSIS — D509 Iron deficiency anemia, unspecified: Secondary | ICD-10-CM | POA: Diagnosis not present

## 2020-09-11 DIAGNOSIS — E119 Type 2 diabetes mellitus without complications: Secondary | ICD-10-CM | POA: Diagnosis not present

## 2020-09-11 DIAGNOSIS — M1712 Unilateral primary osteoarthritis, left knee: Secondary | ICD-10-CM | POA: Diagnosis not present

## 2020-09-11 DIAGNOSIS — F0280 Dementia in other diseases classified elsewhere without behavioral disturbance: Secondary | ICD-10-CM | POA: Diagnosis not present

## 2020-09-11 DIAGNOSIS — I5022 Chronic systolic (congestive) heart failure: Secondary | ICD-10-CM | POA: Diagnosis not present

## 2020-09-11 DIAGNOSIS — I700 Atherosclerosis of aorta: Secondary | ICD-10-CM | POA: Diagnosis not present

## 2020-09-11 DIAGNOSIS — N390 Urinary tract infection, site not specified: Secondary | ICD-10-CM | POA: Diagnosis not present

## 2020-09-12 ENCOUNTER — Telehealth: Payer: Self-pay

## 2020-09-12 NOTE — Telephone Encounter (Signed)
PA Key: [id number]  Nystatin approved.

## 2020-09-12 NOTE — Telephone Encounter (Signed)
PA key: [facility]  Waiting for response.

## 2020-09-16 DIAGNOSIS — I5022 Chronic systolic (congestive) heart failure: Secondary | ICD-10-CM | POA: Diagnosis not present

## 2020-09-16 DIAGNOSIS — F0280 Dementia in other diseases classified elsewhere without behavioral disturbance: Secondary | ICD-10-CM | POA: Diagnosis not present

## 2020-09-16 DIAGNOSIS — I110 Hypertensive heart disease with heart failure: Secondary | ICD-10-CM | POA: Diagnosis not present

## 2020-09-16 DIAGNOSIS — E119 Type 2 diabetes mellitus without complications: Secondary | ICD-10-CM | POA: Diagnosis not present

## 2020-09-16 DIAGNOSIS — D509 Iron deficiency anemia, unspecified: Secondary | ICD-10-CM | POA: Diagnosis not present

## 2020-09-16 DIAGNOSIS — N390 Urinary tract infection, site not specified: Secondary | ICD-10-CM | POA: Diagnosis not present

## 2020-09-16 DIAGNOSIS — M1712 Unilateral primary osteoarthritis, left knee: Secondary | ICD-10-CM | POA: Diagnosis not present

## 2020-09-16 DIAGNOSIS — E538 Deficiency of other specified B group vitamins: Secondary | ICD-10-CM | POA: Diagnosis not present

## 2020-09-16 DIAGNOSIS — I700 Atherosclerosis of aorta: Secondary | ICD-10-CM | POA: Diagnosis not present

## 2020-09-18 DIAGNOSIS — E538 Deficiency of other specified B group vitamins: Secondary | ICD-10-CM | POA: Diagnosis not present

## 2020-09-18 DIAGNOSIS — F0280 Dementia in other diseases classified elsewhere without behavioral disturbance: Secondary | ICD-10-CM | POA: Diagnosis not present

## 2020-09-18 DIAGNOSIS — I700 Atherosclerosis of aorta: Secondary | ICD-10-CM | POA: Diagnosis not present

## 2020-09-18 DIAGNOSIS — I110 Hypertensive heart disease with heart failure: Secondary | ICD-10-CM | POA: Diagnosis not present

## 2020-09-18 DIAGNOSIS — M1712 Unilateral primary osteoarthritis, left knee: Secondary | ICD-10-CM | POA: Diagnosis not present

## 2020-09-18 DIAGNOSIS — D509 Iron deficiency anemia, unspecified: Secondary | ICD-10-CM | POA: Diagnosis not present

## 2020-09-18 DIAGNOSIS — I5022 Chronic systolic (congestive) heart failure: Secondary | ICD-10-CM | POA: Diagnosis not present

## 2020-09-18 DIAGNOSIS — N390 Urinary tract infection, site not specified: Secondary | ICD-10-CM | POA: Diagnosis not present

## 2020-09-18 DIAGNOSIS — E119 Type 2 diabetes mellitus without complications: Secondary | ICD-10-CM | POA: Diagnosis not present

## 2020-09-20 ENCOUNTER — Telehealth: Payer: Self-pay | Admitting: Family Medicine

## 2020-09-20 ENCOUNTER — Telehealth: Payer: Self-pay | Admitting: Internal Medicine

## 2020-09-20 MED ORDER — NIRMATRELVIR/RITONAVIR (PAXLOVID)TABLET: 2.0000 | ORAL_TABLET | Freq: Two times a day (BID) | ORAL | 0 refills | Status: AC

## 2020-09-20 NOTE — Telephone Encounter (Signed)
Received page from [facility]) about patient testing positive for COVID today. COVID outbreak at facility.  First day of symptoms was today 09/20/2020.  Current symptoms are mild cough, congestion (T 99.8, O2 sat 94%).  Unsure if she's had vaccines - they don't have records for any immunizations.   Spoke with daughter [name] - patient has not had COVID vaccine. Patient had COVID infection 01/2020, did receive Regen-Cov mAb 01/2020 then hospitalized IV remdesivir x5d + steroids for COVID PNA.   Reviewed treatment options with daughter including paxlovid and mAb infusion.  Will Rx Paxlovid 5d course lower dose given kidney function.  Daughter will pick up Rx at [facility] and take to facility.   I will write Rx and fax to facility tomorrow morning at Saturday clinic (fax [phone]). rec hold atorvastatin while on paxlovid.  No standing orders available at facility - will send order for guaifenesin and tylenol PRN.  Reviewed red flags to call EMS  Lab Results  Component Value Date   CREATININE 1.08 08/14/2020   BUN 23 08/14/2020   NA 142 08/14/2020   K 5.3 No hemolysis seen (H) 08/14/2020   CL 104 08/14/2020   CO2 31 08/14/2020

## 2020-09-20 NOTE — Telephone Encounter (Signed)
Probiotic Product (PROBIOTIC PO) [facility] calling, states the patients daughter is requesting the patient be taken off the probiotic. If Dr. Jonny Ruiz decideds to do so please fax Amanda Cockayne to let them know. Phone# [phone] Fax# [phone]

## 2020-09-23 ENCOUNTER — Ambulatory Visit (HOSPITAL_COMMUNITY): Payer: Medicare HMO

## 2020-09-23 ENCOUNTER — Telehealth: Payer: Self-pay | Admitting: Internal Medicine

## 2020-09-23 ENCOUNTER — Telehealth: Payer: Self-pay

## 2020-09-23 NOTE — Telephone Encounter (Signed)
I spoke with daughter, pharmacist and [facility] on Saturday. Order faxed Saturday afternoon to administer paxlovid medication that daughter picked up from [place] pharmacy.

## 2020-09-23 NOTE — Telephone Encounter (Signed)
See 09/20/20 phone note also;sending note to DR Sharen Hones and Dr Oliver Barre as PCP.

## 2020-09-23 NOTE — Telephone Encounter (Signed)
Ok for verbal if this is ok 

## 2020-09-23 NOTE — Telephone Encounter (Signed)
[facility] FYI;  --Caller states she spoke with Dr. Sharen Hones yesterday and a Covid viral order was suppose to be called in to an assisted living facility. Reports they do not have an order to match the prescription. Caller states she was instructed to call and having him paged if the order was not received.   Fax: [phone].  Email: [email or web address]. [employer]. Reports that she delivered the prescription today and delivered the e-script.

## 2020-09-23 NOTE — Telephone Encounter (Signed)
Spoke with a nurse with [employer]. Nurse states that patient currently has COVID and they are needing an order for patient to take:  Nirmatrelvir and Ritonavir tablet therapy pack 20x100mg  and 10x100mg  Take 2 tablets by mouth twice daily for 5 days.  Please advise

## 2020-09-23 NOTE — Telephone Encounter (Signed)
Sorry - I dont understand what a "covid viral order" is  Please clarify

## 2020-09-23 NOTE — Telephone Encounter (Signed)
[facility] Night - Client TELEPHONE ADVICE RECORD [facility] Patient Name: Kathy Thomas Gender: Female DOB: 12-Jan-1935 Age: 86 Y 5 M 17 D Return Phone Number: Address: City/ State/ Zip: [place] Client [facility] Night - Client Client Site [facility] - Night Physician Eustaquio Boyden - MD Contact Type Call Who Is Calling Pharmacy Call Type Pharmacy Send to RN Chief Complaint Paging or Request for Consult Reason for Call Request to speak to Physician Initial Comment Caller states she is needing a medication request called in and would like to page the on call provider. Pharmacy Name [facility] Pharmacist Name Zella Ball Pharmacy Number [phone] Translation No Nurse Assessment Nurse: Lily Kocher, RN, Adriana Date/Time Lamount Cohen Time): 09/21/2020 2:29:27 PM Confirm and document reason for call. If symptomatic, describe symptoms. ---caller is pharmacist. pt is at an assisted living facility. living facility needs an order to administer the medication. DON (jaden) number is [phone], email:[email or web address], fax [phone] Does the patient have any new or worsening symptoms? ---No Nurse: Lily Kocher, RN, Adriana Date/Time (Eastern Time): 09/21/2020 2:34:41 PM Please select the assessment type ---Pharmacy clarification Additional Documentation ---facility needs an actaual drs order that is signed to administer med Is there an on-call physician for the client? ---Yes Do the client directives allow paging the on call for medication concerns? ---Yes Disp. Time Lamount Cohen Time) Disposition Final User 09/21/2020 2:36:36 PM Paged On Call back to [facility], RN, Ricki Rodriguez 09/21/2020 2:41:54 PM Pharmacy Call Lily Kocher, RN, Adriana Reason: need clarification 09/21/2020 2:42:02 PM Clinical Call Yes Lily Kocher, RN, Adriana PLEASE NOTE: All timestamps contained within this report are represented as [place] Standard  Time. CONFIDENTIALTY NOTICE: This fax transmission is intended only for the addressee. It contains information that is legally privileged, confidential or otherwise protected from use or disclosure. If you are not the intended recipient, you are strictly prohibited from reviewing, disclosing, copying using or disseminating any of this information or taking any action in reliance on or regarding this information. If you have received this fax in error, please notify us immediately by telephone so that we can arrange for its return to [place]. Phone: [phone], Toll-Free: [phone], Fax: [phone] Page: 2 of 2 Call Id: [id number] Paging DoctorName Phone DateTime Result/ Outcome Message Type Notes Eustaquio Boyden - MD [phone] 09/21/2020 2:36:36 PM Paged On Call Back to Call Center Doctor Paged [phone] Robin Searing - MD 09/21/2020 2:41:24 PM Spoke with On Call - General Message Result connected pharmacist with MD

## 2020-09-24 NOTE — Telephone Encounter (Signed)
Per Dewayne Hatch with [facility], orders will need to be faxed. They cannot take verbal orders.

## 2020-09-24 NOTE — Telephone Encounter (Signed)
Done hardcopy 

## 2020-09-27 DIAGNOSIS — N390 Urinary tract infection, site not specified: Secondary | ICD-10-CM | POA: Diagnosis not present

## 2020-09-27 DIAGNOSIS — D509 Iron deficiency anemia, unspecified: Secondary | ICD-10-CM | POA: Diagnosis not present

## 2020-09-27 DIAGNOSIS — F0280 Dementia in other diseases classified elsewhere without behavioral disturbance: Secondary | ICD-10-CM | POA: Diagnosis not present

## 2020-09-27 DIAGNOSIS — I5022 Chronic systolic (congestive) heart failure: Secondary | ICD-10-CM | POA: Diagnosis not present

## 2020-09-27 DIAGNOSIS — E119 Type 2 diabetes mellitus without complications: Secondary | ICD-10-CM | POA: Diagnosis not present

## 2020-09-27 DIAGNOSIS — M1712 Unilateral primary osteoarthritis, left knee: Secondary | ICD-10-CM | POA: Diagnosis not present

## 2020-09-27 DIAGNOSIS — E538 Deficiency of other specified B group vitamins: Secondary | ICD-10-CM | POA: Diagnosis not present

## 2020-09-27 DIAGNOSIS — I110 Hypertensive heart disease with heart failure: Secondary | ICD-10-CM | POA: Diagnosis not present

## 2020-09-27 DIAGNOSIS — I700 Atherosclerosis of aorta: Secondary | ICD-10-CM | POA: Diagnosis not present

## 2020-10-01 DIAGNOSIS — D509 Iron deficiency anemia, unspecified: Secondary | ICD-10-CM | POA: Diagnosis not present

## 2020-10-01 DIAGNOSIS — I700 Atherosclerosis of aorta: Secondary | ICD-10-CM | POA: Diagnosis not present

## 2020-10-01 DIAGNOSIS — I5022 Chronic systolic (congestive) heart failure: Secondary | ICD-10-CM | POA: Diagnosis not present

## 2020-10-01 DIAGNOSIS — E119 Type 2 diabetes mellitus without complications: Secondary | ICD-10-CM | POA: Diagnosis not present

## 2020-10-01 DIAGNOSIS — I110 Hypertensive heart disease with heart failure: Secondary | ICD-10-CM | POA: Diagnosis not present

## 2020-10-01 DIAGNOSIS — F0280 Dementia in other diseases classified elsewhere without behavioral disturbance: Secondary | ICD-10-CM | POA: Diagnosis not present

## 2020-10-01 DIAGNOSIS — E538 Deficiency of other specified B group vitamins: Secondary | ICD-10-CM | POA: Diagnosis not present

## 2020-10-01 DIAGNOSIS — M1712 Unilateral primary osteoarthritis, left knee: Secondary | ICD-10-CM | POA: Diagnosis not present

## 2020-10-01 DIAGNOSIS — N390 Urinary tract infection, site not specified: Secondary | ICD-10-CM | POA: Diagnosis not present

## 2020-10-08 DIAGNOSIS — I5022 Chronic systolic (congestive) heart failure: Secondary | ICD-10-CM | POA: Diagnosis not present

## 2020-10-08 DIAGNOSIS — I110 Hypertensive heart disease with heart failure: Secondary | ICD-10-CM | POA: Diagnosis not present

## 2020-10-08 DIAGNOSIS — M1712 Unilateral primary osteoarthritis, left knee: Secondary | ICD-10-CM | POA: Diagnosis not present

## 2020-10-08 DIAGNOSIS — D509 Iron deficiency anemia, unspecified: Secondary | ICD-10-CM | POA: Diagnosis not present

## 2020-10-08 DIAGNOSIS — I700 Atherosclerosis of aorta: Secondary | ICD-10-CM | POA: Diagnosis not present

## 2020-10-08 DIAGNOSIS — E119 Type 2 diabetes mellitus without complications: Secondary | ICD-10-CM | POA: Diagnosis not present

## 2020-10-08 DIAGNOSIS — N390 Urinary tract infection, site not specified: Secondary | ICD-10-CM | POA: Diagnosis not present

## 2020-10-08 DIAGNOSIS — F0280 Dementia in other diseases classified elsewhere without behavioral disturbance: Secondary | ICD-10-CM | POA: Diagnosis not present

## 2020-10-08 DIAGNOSIS — E538 Deficiency of other specified B group vitamins: Secondary | ICD-10-CM | POA: Diagnosis not present

## 2020-10-15 DIAGNOSIS — N390 Urinary tract infection, site not specified: Secondary | ICD-10-CM | POA: Diagnosis not present

## 2020-10-15 DIAGNOSIS — E119 Type 2 diabetes mellitus without complications: Secondary | ICD-10-CM | POA: Diagnosis not present

## 2020-10-15 DIAGNOSIS — M1712 Unilateral primary osteoarthritis, left knee: Secondary | ICD-10-CM | POA: Diagnosis not present

## 2020-10-15 DIAGNOSIS — I110 Hypertensive heart disease with heart failure: Secondary | ICD-10-CM | POA: Diagnosis not present

## 2020-10-15 DIAGNOSIS — I5022 Chronic systolic (congestive) heart failure: Secondary | ICD-10-CM | POA: Diagnosis not present

## 2020-10-15 DIAGNOSIS — E538 Deficiency of other specified B group vitamins: Secondary | ICD-10-CM | POA: Diagnosis not present

## 2020-10-15 DIAGNOSIS — D509 Iron deficiency anemia, unspecified: Secondary | ICD-10-CM | POA: Diagnosis not present

## 2020-10-15 DIAGNOSIS — F0280 Dementia in other diseases classified elsewhere without behavioral disturbance: Secondary | ICD-10-CM | POA: Diagnosis not present

## 2020-10-15 DIAGNOSIS — I700 Atherosclerosis of aorta: Secondary | ICD-10-CM | POA: Diagnosis not present

## 2020-10-16 DIAGNOSIS — I700 Atherosclerosis of aorta: Secondary | ICD-10-CM | POA: Diagnosis not present

## 2020-10-16 DIAGNOSIS — M1712 Unilateral primary osteoarthritis, left knee: Secondary | ICD-10-CM | POA: Diagnosis not present

## 2020-10-16 DIAGNOSIS — E119 Type 2 diabetes mellitus without complications: Secondary | ICD-10-CM | POA: Diagnosis not present

## 2020-10-16 DIAGNOSIS — I110 Hypertensive heart disease with heart failure: Secondary | ICD-10-CM | POA: Diagnosis not present

## 2020-10-16 DIAGNOSIS — I5022 Chronic systolic (congestive) heart failure: Secondary | ICD-10-CM | POA: Diagnosis not present

## 2020-10-16 DIAGNOSIS — D509 Iron deficiency anemia, unspecified: Secondary | ICD-10-CM | POA: Diagnosis not present

## 2020-10-16 DIAGNOSIS — E538 Deficiency of other specified B group vitamins: Secondary | ICD-10-CM | POA: Diagnosis not present

## 2020-10-16 DIAGNOSIS — F0280 Dementia in other diseases classified elsewhere without behavioral disturbance: Secondary | ICD-10-CM | POA: Diagnosis not present

## 2020-10-16 DIAGNOSIS — N390 Urinary tract infection, site not specified: Secondary | ICD-10-CM | POA: Diagnosis not present

## 2020-10-23 ENCOUNTER — Telehealth: Payer: Self-pay | Admitting: Internal Medicine

## 2020-10-23 NOTE — Telephone Encounter (Signed)
LVM for pt to rtn my all to schedule AWV with [facility]. Please schedule AWV with [facility] if pt calls the office.

## 2020-10-24 ENCOUNTER — Encounter: Payer: Self-pay | Admitting: Internal Medicine

## 2020-10-24 ENCOUNTER — Ambulatory Visit (HOSPITAL_COMMUNITY): Payer: Medicare HMO | Attending: Cardiology

## 2020-10-24 ENCOUNTER — Other Ambulatory Visit: Payer: Self-pay

## 2020-10-24 DIAGNOSIS — I5022 Chronic systolic (congestive) heart failure: Secondary | ICD-10-CM | POA: Diagnosis not present

## 2020-10-24 LAB — ECHOCARDIOGRAM COMPLETE
Area-P 1/2: 3.6 cm2
S' Lateral: 3.8 cm

## 2020-10-24 MED ORDER — PERFLUTREN LIPID MICROSPHERE
1.0000 mL | INTRAVENOUS | Status: AC | PRN
  Administered 2020-10-24: 1 mL via INTRAVENOUS

## 2020-11-07 ENCOUNTER — Other Ambulatory Visit: Payer: Self-pay | Admitting: Internal Medicine

## 2020-11-19 ENCOUNTER — Other Ambulatory Visit: Payer: Self-pay | Admitting: Internal Medicine

## 2020-11-19 MED ORDER — MELOXICAM 7.5 MG PO TABS: 7.5000 mg | ORAL_TABLET | Freq: Every day | ORAL | 1 refills | Status: DC

## 2020-12-18 ENCOUNTER — Other Ambulatory Visit: Payer: Self-pay | Admitting: Internal Medicine

## 2021-01-16 ENCOUNTER — Other Ambulatory Visit: Payer: Self-pay | Admitting: Internal Medicine

## 2021-01-16 NOTE — Telephone Encounter (Signed)
Please refill as per office routine med refill policy (all routine meds refilled for 3 mo or monthly per pt preference up to one year from last visit, then month to month grace period for 3 mo, then further med refills will have to be denied)  

## 2021-02-21 ENCOUNTER — Other Ambulatory Visit: Payer: Self-pay | Admitting: Internal Medicine

## 2021-03-17 ENCOUNTER — Other Ambulatory Visit: Payer: Self-pay | Admitting: Internal Medicine

## 2021-03-21 ENCOUNTER — Encounter: Payer: Self-pay | Admitting: Internal Medicine

## 2021-03-21 NOTE — Telephone Encounter (Signed)
Order done hardcopy to [facility]

## 2021-03-31 ENCOUNTER — Telehealth: Payer: Self-pay | Admitting: Internal Medicine

## 2021-03-31 NOTE — Telephone Encounter (Signed)
Home Health verbal orders-caller/Agency: Lauren/ [facility]  Callback number: [phone]  Requesting OT/PT/Skilled nursing/Social Work/Speech: OT  Frequency: 1w6  Therapist is requesting fax orders for bariatric toilet seat   Fax [phone]

## 2021-03-31 NOTE — Telephone Encounter (Signed)
Ok for verbals 

## 2021-03-31 NOTE — Telephone Encounter (Signed)
Home Health verbal orders-caller/Agency: Maria/ [facility]  Callback number: [phone]  Requesting OT/PT/Skilled nursing/Social Work/Speech: PT  Frequency: 1w9

## 2021-04-01 NOTE — Telephone Encounter (Signed)
Left message for Lauren with [facility] to call me back

## 2021-04-01 NOTE — Telephone Encounter (Signed)
Verbals given  

## 2021-04-02 ENCOUNTER — Encounter: Payer: Self-pay | Admitting: Internal Medicine

## 2021-04-02 ENCOUNTER — Ambulatory Visit (INDEPENDENT_AMBULATORY_CARE_PROVIDER_SITE_OTHER): Payer: Medicare HMO | Admitting: Internal Medicine

## 2021-04-02 ENCOUNTER — Other Ambulatory Visit: Payer: Self-pay

## 2021-04-02 VITALS — BP 102/60 | HR 44 | Ht 61.0 in | Wt 248.0 lb

## 2021-04-02 DIAGNOSIS — Z6841 Body Mass Index (BMI) 40.0 and over, adult: Secondary | ICD-10-CM | POA: Diagnosis not present

## 2021-04-02 DIAGNOSIS — E1169 Type 2 diabetes mellitus with other specified complication: Secondary | ICD-10-CM

## 2021-04-02 DIAGNOSIS — I10 Essential (primary) hypertension: Secondary | ICD-10-CM | POA: Diagnosis not present

## 2021-04-02 DIAGNOSIS — R269 Unspecified abnormalities of gait and mobility: Secondary | ICD-10-CM

## 2021-04-02 DIAGNOSIS — R531 Weakness: Secondary | ICD-10-CM | POA: Diagnosis not present

## 2021-04-02 DIAGNOSIS — I5032 Chronic diastolic (congestive) heart failure: Secondary | ICD-10-CM

## 2021-04-02 DIAGNOSIS — I5030 Unspecified diastolic (congestive) heart failure: Secondary | ICD-10-CM | POA: Diagnosis not present

## 2021-04-02 DIAGNOSIS — F0390 Unspecified dementia without behavioral disturbance: Secondary | ICD-10-CM

## 2021-04-02 LAB — CBC WITH DIFFERENTIAL/PLATELET
Basophils Absolute: 0 10*3/uL (ref 0.0–0.1)
Basophils Relative: 0.5 % (ref 0.0–3.0)
Eosinophils Absolute: 0.2 10*3/uL (ref 0.0–0.7)
Eosinophils Relative: 3.8 % (ref 0.0–5.0)
HCT: 40.1 % (ref 36.0–46.0)
Hemoglobin: 13 g/dL (ref 12.0–15.0)
Lymphocytes Relative: 26 % (ref 12.0–46.0)
Lymphs Abs: 1.5 10*3/uL (ref 0.7–4.0)
MCHC: 32.4 g/dL (ref 30.0–36.0)
MCV: 94.1 fl (ref 78.0–100.0)
Monocytes Absolute: 0.8 10*3/uL (ref 0.1–1.0)
Monocytes Relative: 13.7 % — ABNORMAL HIGH (ref 3.0–12.0)
Neutro Abs: 3.2 10*3/uL (ref 1.4–7.7)
Neutrophils Relative %: 56 % (ref 43.0–77.0)
Platelets: 167 10*3/uL (ref 150.0–400.0)
RBC: 4.26 Mil/uL (ref 3.87–5.11)
RDW: 14 % (ref 11.5–15.5)
WBC: 5.7 10*3/uL (ref 4.0–10.5)

## 2021-04-02 LAB — BASIC METABOLIC PANEL
BUN: 48 mg/dL — ABNORMAL HIGH (ref 6–23)
CO2: 34 mEq/L — ABNORMAL HIGH (ref 19–32)
Calcium: 8.9 mg/dL (ref 8.4–10.5)
Chloride: 102 mEq/L (ref 96–112)
Creatinine, Ser: 1.35 mg/dL — ABNORMAL HIGH (ref 0.40–1.20)
GFR: 36.22 mL/min — ABNORMAL LOW (ref >60.00)
Glucose, Bld: 110 mg/dL — ABNORMAL HIGH (ref 70–99)
Potassium: 5.4 mEq/L — ABNORMAL HIGH (ref 3.5–5.1)
Sodium: 141 mEq/L (ref 135–145)

## 2021-04-02 LAB — LIPID PANEL
Cholesterol: 153 mg/dL (ref 0–200)
HDL: 45.4 mg/dL (ref >39.00)
LDL Cholesterol: 88 mg/dL (ref 0–99)
NonHDL: 107.7
Total CHOL/HDL Ratio: 3
Triglycerides: 97 mg/dL (ref 0.0–149.0)
VLDL: 19.4 mg/dL (ref 0.0–40.0)

## 2021-04-02 LAB — TSH: TSH: 1.52 u[IU]/mL (ref 0.35–5.50)

## 2021-04-02 LAB — HEPATIC FUNCTION PANEL
ALT: 12 U/L (ref 0–35)
AST: 15 U/L (ref 0–37)
Albumin: 3.9 g/dL (ref 3.5–5.2)
Alkaline Phosphatase: 103 U/L (ref 39–117)
Bilirubin, Direct: 0.1 mg/dL (ref 0.0–0.3)
Total Bilirubin: 0.3 mg/dL (ref 0.2–1.2)
Total Protein: 6.8 g/dL (ref 6.0–8.3)

## 2021-04-02 LAB — HEMOGLOBIN A1C: Hgb A1c MFr Bld: 6.9 % — ABNORMAL HIGH (ref 4.6–6.5)

## 2021-04-02 LAB — BRAIN NATRIURETIC PEPTIDE: Pro B Natriuretic peptide (BNP): 181 pg/mL — ABNORMAL HIGH (ref 0.0–100.0)

## 2021-04-02 MED ORDER — LOSARTAN POTASSIUM 100 MG PO TABS: 50.0000 mg | ORAL_TABLET | Freq: Every morning | ORAL | 3 refills | Status: DC

## 2021-04-02 NOTE — Assessment & Plan Note (Signed)
Lab Results  Component Value Date   HGBA1C 6.9 (H) 04/02/2021   Stable, pt to continue current medical treatment actos

## 2021-04-02 NOTE — Patient Instructions (Signed)
Please see the [facility] for the improved walker as you mentioned  Ok to decrease the losartan to 50 mg per day  Please have the urine testing done at the facility  OK for the Toilet Riser rx  Please continue all other medications as before, and refills have been done if requested.  Please have the pharmacy call with any other refills you may need.  Please keep your appointments with your specialists as you may have planned  Please go to the LAB at the blood drawing area for the tests to be done  You will be contacted by phone if any changes need to be made immediately.  Otherwise, you will receive a letter about your results with an explanation, but please check with MyChart first.  Please remember to sign up for MyChart if you have not done so, as this will be important to you in the future with finding out test results, communicating by private email, and scheduling acute appointments online when needed.  Please make an Appointment to return in 6 months, or sooner if needed

## 2021-04-02 NOTE — Assessment & Plan Note (Signed)
BP Readings from Last 3 Encounters:  04/02/21 102/60  08/14/20 124/78  03/15/20 128/84   Low normal, for decreased losartan to 50 qd given her recent weakness and presumed increased risk of fall

## 2021-04-02 NOTE — Assessment & Plan Note (Addendum)
Ok for toilet riser,  to f/u any worsening symptoms or concerns; also fur ua and cx given recent weakness, and to continue PT/Ot to finish

## 2021-04-02 NOTE — Assessment & Plan Note (Signed)
As above per gait disorder, no recent falls

## 2021-04-02 NOTE — Progress Notes (Signed)
Patient ID: Kathy Thomas, female   DOB: [birth year], 86 y.o.   MRN: [id number]        Chief Complaint: follow up dementia,        HPI:  Kathy Thomas is a 86 y.o. female here with daughter who has several grievances regarding the care of mother at assisted living in [place], most of which relate to her med list with lasix not on it (so not sure if really getting it) and zinc still on the list which Kathy had asked to have stopped.  Pt now getting PT/OT starting this wk after several weeks reduced stamina and generalized weakness for unclear reason.  Pt without specific complaint - Dementia overall stable symptomatically, and not assoc with behavioral changes such as hallucinations, paranoia, or agitation.  Daughter asks for toilet riser to assist with toileting.  BP has been lower lately.  Denies urinary symptoms such as dysuria, frequency, urgency, flank pain, hematuria or n/v, fever, chills.   Pt denies fever, wt loss, night sweats, loss of appetite, or other constitutional symptoms   Pt denies polydipsia, polyuria       Wt Readings from Last 3 Encounters:  04/02/21 248 lb (112.5 kg)  08/14/20 230 lb (104.3 kg)  03/15/20 214 lb (97.1 kg)   BP Readings from Last 3 Encounters:  04/02/21 102/60  08/14/20 124/78  03/15/20 128/84         Past Medical History:  Diagnosis Date   Acute sinusitis, unspecified 05/28/2008   ANEMIA-IRON DEFICIENCY 10/26/2008   ANXIETY 10/06/2007   CHOLELITHIASIS 10/06/2007   COLONIC POLYPS, HX OF 01/26/2007   DIABETES MELLITUS, TYPE II 10/06/2007   DIVERTICULOSIS, COLON 01/26/2007   ECCHYMOSES, SPONTANEOUS 06/04/2010   GERD 01/26/2007   Heart murmur    slight heart murmur   HERNIATED DISC 01/26/2007   HYPERLIPIDEMIA 10/06/2007   HYPERTENSION 01/26/2007   LOW BACK PAIN 01/29/2007   MENOPAUSAL DISORDER 10/26/2008   OSTEOPOROSIS 06/04/2010   SHOULDER PAIN, LEFT 10/06/2007   Stroke ([place])    TIA's   Past Surgical History:  Procedure Laterality Date   ABDOMINAL HYSTERECTOMY      APPENDECTOMY     Fracture left  wrist  2010   Dr. Amedeo Plenty   Herniated disk   1995   C-spine   Left ankle-screw placement  1980   Left elbow-nerve impingement  1985   TONSILLECTOMY      reports that Kathy quit smoking about 7 years ago. Her smoking use included cigarettes. Kathy has never used smokeless tobacco. Kathy reports that Kathy does not drink alcohol and does not use drugs. family history includes Heart disease in her father; Hyperlipidemia in an other family member; Hypertension in an other family member; Stroke in an other family member. Allergies  Allergen Reactions   Codeine Hives   Penicillins     REACTION: rash   Morphine Rash    REACTION: pt unsure of reaction   Current Outpatient Medications on File Prior to Visit  Medication Sig Dispense Refill   acetaminophen (TYLENOL) 500 MG tablet Take 1,000 mg by mouth every 6 (six) hours as needed for mild pain or headache.     alendronate (FOSAMAX) 70 MG tablet TAKE ONE TABLET BY MOUTH ONCE WEEKLY BEFORE BREAKFAST ON MONDAY 12 tablet 2   Ascorbic Acid (VITAMIN C) 500 MG CAPS Take by mouth.     ASPIRIN 81 PO Take by mouth daily.     atorvastatin (LIPITOR) 40 MG tablet TAKE ONE TABLET BY  MOUTH EVERYDAY AT BEDTIME 90 tablet 1   Cholecalciferol (VITAMIN D3 PO) Take 1 capsule by mouth daily.     dicyclomine (BENTYL) 20 MG tablet TAKE ONE TABLET BY MOUTH EVERY MORNING and TAKE ONE TABLET BY MOUTH EVERYDAY AT BEDTIME 180 tablet 2   donepezil (ARICEPT) 10 MG tablet TAKE ONE TABLET BY MOUTH EVERYDAY AT BEDTIME 90 tablet 1   FIBER PO Take 1 tablet by mouth daily.     furosemide (LASIX) 40 MG tablet Take 1 tablet (40 mg total) by mouth daily. 90 tablet 3   meloxicam (MOBIC) 7.5 MG tablet Take 1 tablet by mouth once daily 30 tablet 2   Multiple Vitamins-Minerals (ZINC PO) Take 1 tablet by mouth daily.     nystatin (MYCOSTATIN/NYSTOP) powder Use as directed twice daily as needed to affected area 60 g 01   pioglitazone (ACTOS) 30 MG tablet TAKE  ONE TABLET BY MOUTH EVERY MORNING 90 tablet 1   Probiotic Product (PROBIOTIC PO) Take 1 capsule by mouth daily.     vitamin B-12 1000 MCG tablet Take 1 tablet (1,000 mcg total) by mouth daily. 30 tablet 1   No current facility-administered medications on file prior to visit.        ROS:  All others reviewed and negative.  Objective        PE:  BP 102/60 (BP Location: Right Arm, Patient Position: Sitting, Cuff Size: Large)   Pulse (!) 44   Ht 5\' 1"  (1.549 m)   Wt 248 lb (112.5 kg)   SpO2 97%   BMI 46.86 kg/m                 Constitutional: Pt appears in NAD               HENT: Head: NCAT.                Right Ear: External ear normal.                 Left Ear: External ear normal.                Eyes: . Pupils are equal, round, and reactive to light. Conjunctivae and EOM are normal               Nose: without d/c or deformity               Neck: Neck supple. Gross normal ROM               Cardiovascular: Normal rate and regular rhythm.                 Pulmonary/Chest: Effort normal and breath sounds without rales or wheezing.                Abd:  Soft, NT, ND, + BS, no organomegaly               Neurological: Pt is alert. At baseline orientation, motor grossly intact               Skin: Skin is warm. No rashes, no other new lesions, LE edema - none               Psychiatric: Pt behavior is normal without agitation   Micro: none  Cardiac tracings I have personally interpreted today:  none  Pertinent Radiological findings (summarize): none   Lab Results  Component Value Date   WBC 5.7 04/02/2021   HGB 13.0  04/02/2021   HCT 40.1 04/02/2021   PLT 167.0 04/02/2021   GLUCOSE 110 (H) 04/02/2021   CHOL 153 04/02/2021   TRIG 97.0 04/02/2021   HDL 45.40 04/02/2021   LDLDIRECT 74.0 12/06/2015   LDLCALC 88 04/02/2021   ALT 12 04/02/2021   AST 15 04/02/2021   NA 141 04/02/2021   K 5.4 No hemolysis seen (H) 04/02/2021   CL 102 04/02/2021   CREATININE 1.35 (H) 04/02/2021   BUN  48 (H) 04/02/2021   CO2 34 (H) 04/02/2021   TSH 1.52 04/02/2021   HGBA1C 6.9 (H) 04/02/2021   MICROALBUR 1.8 08/14/2020   Assessment/Plan:  SHELIAH Thomas is a 86 y.o. White or Caucasian [1] female with  has a past medical history of Acute sinusitis, unspecified (05/28/2008), ANEMIA-IRON DEFICIENCY (10/26/2008), ANXIETY (10/06/2007), CHOLELITHIASIS (10/06/2007), COLONIC POLYPS, HX OF (01/26/2007), DIABETES MELLITUS, TYPE II (10/06/2007), DIVERTICULOSIS, COLON (01/26/2007), ECCHYMOSES, SPONTANEOUS (06/04/2010), GERD (01/26/2007), Heart murmur, HERNIATED DISC (01/26/2007), HYPERLIPIDEMIA (10/06/2007), HYPERTENSION (01/26/2007), LOW BACK PAIN (01/29/2007), MENOPAUSAL DISORDER (10/26/2008), OSTEOPOROSIS (06/04/2010), SHOULDER PAIN, LEFT (10/06/2007), and Stroke (HCC).  Diastolic CHF (HCC) Volume appear to be stable to mild dry, so I suspect Kathy is getting the daily lasix though daughter is correct that her med list today does not include the lasix; will check renal lab today  DM type 2 (diabetes mellitus, type 2) (HCC) Lab Results  Component Value Date   HGBA1C 6.9 (H) 04/02/2021   Stable, pt to continue current medical treatment actos   Essential hypertension BP Readings from Last 3 Encounters:  04/02/21 102/60  08/14/20 124/78  03/15/20 128/84   Low normal, for decreased losartan to 50 qd given her recent weakness and presumed increased risk of fall   Gait disorder Ok for toilet riser,  to f/u any worsening symptoms or concerns; also fur ua and cx given recent weakness, and to continue PT/Ot to finish  Dementia without behavioral disturbance (HCC) O/w stable, to continue aricept  Generalized weakness As above per gait disorder, no recent falls  Followup: Return in about 6 months (around 09/30/2021).  Oliver Barre, MD 04/02/2021 9:46 PM [facility]

## 2021-04-02 NOTE — Assessment & Plan Note (Signed)
Volume appear to be stable to mild dry, so I suspect she is getting the daily lasix though daughter is correct that her med list today does not include the lasix; will check renal lab today

## 2021-04-02 NOTE — Assessment & Plan Note (Signed)
O/w stable, to continue aricept

## 2021-04-03 NOTE — Telephone Encounter (Signed)
Kathy Thomas with [facility] has called to clarify the primary diagnosis to be used for coding for pt. To continue home health and PT.   Please advise.    Callback #- [phone]

## 2021-04-03 NOTE — Telephone Encounter (Signed)
Spoke with Byrd Hesselbach from [facility] with diagnosis

## 2021-04-04 MED ORDER — LOSARTAN POTASSIUM 50 MG PO TABS: 50.0000 mg | ORAL_TABLET | Freq: Every day | ORAL | 3 refills | Status: DC

## 2021-04-15 ENCOUNTER — Encounter: Payer: Self-pay | Admitting: Internal Medicine

## 2021-04-15 MED ORDER — CIPROFLOXACIN HCL 500 MG PO TABS: 500.0000 mg | ORAL_TABLET | Freq: Two times a day (BID) | ORAL | 0 refills | Status: DC

## 2021-04-15 NOTE — Telephone Encounter (Signed)
Kathy Thomas or staff -   Kathy Thomas has a UTI - I have sent a rx for Cipro 500 bid for 10 days to the pharmacy  I believe she is in a facility [facility]evere -   Please confirm with her family, I believe it is her daughter - confirm which facility and the fax number - then please try to have a office MD fax an order to the facility to assist in the cipro Rx I sent to [facility] in [facility]

## 2021-04-25 ENCOUNTER — Encounter: Payer: Self-pay | Admitting: Podiatry

## 2021-04-25 ENCOUNTER — Other Ambulatory Visit: Payer: Self-pay

## 2021-04-25 ENCOUNTER — Ambulatory Visit (INDEPENDENT_AMBULATORY_CARE_PROVIDER_SITE_OTHER): Payer: Medicare HMO | Admitting: Podiatry

## 2021-04-25 DIAGNOSIS — M79674 Pain in right toe(s): Secondary | ICD-10-CM

## 2021-04-25 DIAGNOSIS — B351 Tinea unguium: Secondary | ICD-10-CM | POA: Diagnosis not present

## 2021-04-25 DIAGNOSIS — E119 Type 2 diabetes mellitus without complications: Secondary | ICD-10-CM | POA: Diagnosis not present

## 2021-04-25 DIAGNOSIS — M79675 Pain in left toe(s): Secondary | ICD-10-CM

## 2021-04-28 ENCOUNTER — Ambulatory Visit: Payer: Medicare HMO | Admitting: Cardiology

## 2021-04-30 NOTE — Progress Notes (Signed)
  Subjective:  Patient ID: Kathy Thomas, female    DOB: 29-Aug-1934,  MRN: [id number]  DANISHA BRASSFIELD presents to clinic today for preventative diabetic foot care and painful elongated mycotic toenails 1-5 bilaterally which are tender when wearing enclosed shoe gear. Pain is relieved with periodic professional debridement.  Her daughter, [name],  is present during today's visit.   Ms. Ahmed is a resident of [facility] in [place]. Daughter states Ms. Spiewak does not see facility PCP.   Patient does not monitor blood glucose daily.  PCP is [name], MD , and last visit was 04/02/2021.  Allergies  Allergen Reactions   Codeine Hives   Penicillins     REACTION: rash   Morphine Rash    REACTION: pt unsure of reaction    Review of Systems: Negative except as noted in the HPI. Objective:   Constitutional Kathy Thomas is a pleasant 86 y.o. Caucasian female, morbidly obese in NAD. AAO x 3.   Vascular CFT immediate b/l LE. Palpable DP/PT pulses b/l LE. Digital hair present b/l. Skin temperature gradient WNL b/l. No pain with calf compression b/l. No edema noted b/l. No cyanosis or clubbing noted b/l LE.  Neurologic Normal speech. Oriented to person, place, and time. Protective sensation intact 5/5 intact bilaterally with 10g monofilament b/l. Vibratory sensation intact b/l.  Dermatologic Pedal skin is warm and supple b/l LE. No open wounds b/l LE. No interdigital macerations noted b/l LE. Toenails 1-5 b/l elongated, discolored, dystrophic, thickened, crumbly with subungual debris and tenderness to dorsal palpation.  Orthopedic: Muscle strength 5/5 to all lower extremity muscle groups bilaterally. No gross bony deformities bilaterally.   Radiographs: None  Last A1c:  Hemoglobin A1C Latest Ref Rng & Units 04/02/2021 08/14/2020  HGBA1C 4.6 - 6.5 % 6.9(H) 6.0  Some recent data might be hidden   Assessment:   1. Pain due to onychomycosis of toenails of both feet    2. Diabetes mellitus without complication (HCC)    Plan:  Patient was evaluated and treated and all questions answered. Consent given for treatment as described below: -Examined patient. -No new findings. No new orders. -Mycotic toenails 1-5 bilaterally were debrided in length and girth with sterile nail nippers and dremel without incident. -Patient/POA to call should there be question/concern in the interim.  Return in about 3 months (around 07/24/2021).  Freddie Breech, DPM

## 2021-05-08 ENCOUNTER — Other Ambulatory Visit: Payer: Self-pay | Admitting: Internal Medicine

## 2021-05-12 NOTE — Telephone Encounter (Signed)
[facility] states to [facility] the [facility]  And a new order for the nystatin powder as well.

## 2021-05-16 MED ORDER — CIPROFLOXACIN HCL 500 MG PO TABS: 500.0000 mg | ORAL_TABLET | Freq: Two times a day (BID) | ORAL | 0 refills | Status: AC

## 2021-05-16 MED ORDER — NYSTATIN 100000 UNIT/GM EX POWD: CUTANEOUS | Status: DC

## 2021-05-16 MED ORDER — CIPROFLOXACIN HCL 500 MG PO TABS: 500.0000 mg | ORAL_TABLET | Freq: Two times a day (BID) | ORAL | 0 refills | Status: DC

## 2021-05-16 NOTE — Telephone Encounter (Signed)
Ok both done to [facility]

## 2021-05-21 ENCOUNTER — Other Ambulatory Visit: Payer: Self-pay | Admitting: Internal Medicine

## 2021-05-24 NOTE — Progress Notes (Deleted)
Cardiology Office Note:    Date:  05/24/2021   ID:  [name], DOB Aug 03, 1934, MRN [id number]  PCP:  Biagio Borg, MD   [facility] Cardiologist:  Lenna Sciara, MD Referring MD: Biagio Borg, MD   Chief Complaint/Reason for Referral: Chronic diastolic heart failure  ASSESSMENT:    Chronic diastolic heart failure ([place])    PLAN:    In order of problems listed above:  1.  We will add Jardiance to her regimen as SGLT2 inhibitors are really the only proven medication class to show tremendous benefit.  Spironolactone could be added as well.          {Are you ordering a CV Procedure (e.g. stress test, cath, DCCV, TEE, etc)?   Press F2        :[id number]   Dispo:  No follow-ups on file.     Medication Adjustments/Labs and Tests Ordered: Current medicines are reviewed at length with the patient today.  Concerns regarding medicines are outlined above.   Tests Ordered: No orders of the defined types were placed in this encounter.   Medication Changes: No orders of the defined types were placed in this encounter.   History of Present Illness:    FOCUSED CARDIOVASCULAR PROBLEM LIST:   1.  Diastolic dysfunction 2.  Type 2 diabetes 3.  Hypertension 4.  Hyperlipidemia  The patient is a 86 y.o. female with the indicated medical history here for recommendations regarding diastolic dysfunction.  The patient was seen by her primary care provider November.  At that time she was euvolemic and doing well very well.  Her dementia was relatively stable.  An echocardiogram in June of last year demonstrated a preserved ejection fraction with diastolic dysfunction and no significant valvular abnormalities.     Previous Medical History: Past Medical History:  Diagnosis Date   Acute sinusitis, unspecified 05/28/2008   ANEMIA-IRON DEFICIENCY 10/26/2008   ANXIETY 10/06/2007   CHOLELITHIASIS 10/06/2007   COLONIC POLYPS, HX OF 01/26/2007   DIABETES MELLITUS, TYPE II  10/06/2007   DIVERTICULOSIS, COLON 01/26/2007   ECCHYMOSES, SPONTANEOUS 06/04/2010   GERD 01/26/2007   Heart murmur    slight heart murmur   HERNIATED DISC 01/26/2007   HYPERLIPIDEMIA 10/06/2007   HYPERTENSION 01/26/2007   LOW BACK PAIN 01/29/2007   MENOPAUSAL DISORDER 10/26/2008   OSTEOPOROSIS 06/04/2010   SHOULDER PAIN, LEFT 10/06/2007   Stroke ([place])    TIA's     Current Medications: No outpatient medications have been marked as taking for the 05/27/21 encounter (Appointment) with Early Osmond, MD.     Allergies:    Codeine, Penicillins, and Morphine   Social History:   Social History   Tobacco Use   Smoking status: Former    Packs/day: 0.00    Types: Cigarettes    Quit date: 06/29/2013    Years since quitting: 7.9   Smokeless tobacco: Never  Vaping Use   Vaping Use: Never used  Substance Use Topics   Alcohol use: No   Drug use: No     Family Hx: Family History  Problem Relation Age of Onset   Heart disease Father    Hyperlipidemia Other    Stroke Other    Hypertension Other    Esophageal cancer Neg Hx    Colon polyps Neg Hx    Rectal cancer Neg Hx    Stomach cancer Neg Hx      Review of Systems:   Please see the history of present illness.  All other systems reviewed and are negative.  EKGs/Labs/Other Test Reviewed:    EKG:  EKG today:***; prior EKG: ***  Prior CV studies:  ECHO COMPLETE WITH IMAGING ENHANCING AGENT 10/24/2020  Narrative ECHOCARDIOGRAM REPORT    Patient Name:   Kathy Thomas Date of Exam: 10/24/2020 Medical Rec #:  [id number]       Height:       61.0 in Accession #:    [id number]      Weight:       230.0 lb Date of Birth:  1934-07-17      BSA:          2.004 m Patient Age:    23 years        BP:           124/78 mmHg Patient Gender: F               HR:           52 bpm. Exam Location:  [facility]  Procedure: 2D Echo, Cardiac Doppler, Color Doppler and Intracardiac Opacification Agent  Indications:    I50.22 CHF  History:         Patient has prior history of Echocardiogram examinations, most recent 07/06/2014. CHF; Risk Factors:Hypertension, Diabetes, Dyslipidemia and Former Smoker. COVID-19. Anemia.  Sonographer:    Jessee Avers, RDCS Referring Phys: [place] Comments: Technically difficult study due to poor echo windows and suboptimal apical window. Image acquisition challenging due to patient body habitus. Global longitudinal strain was attempted. IMPRESSIONS   1. Left ventricular ejection fraction, by estimation, is 50 to 55%. The left ventricle has low normal function. The left ventricle has no regional wall motion abnormalities. The left ventricular internal cavity size was mildly dilated. Left ventricular diastolic parameters are consistent with Grade II diastolic dysfunction (pseudonormalization). 2. Right ventricular systolic function is normal. The right ventricular size is normal. 3. Left atrial size was mild to moderately dilated. 4. Right atrial size was mildly dilated. 5. The mitral valve is normal in structure. Trivial mitral valve regurgitation. No evidence of mitral stenosis. 6. The aortic valve is tricuspid. There is mild calcification of the aortic valve. Aortic valve regurgitation is not visualized. Mild aortic valve sclerosis is present, with no evidence of aortic valve stenosis. 7. The inferior vena cava is normal in size with greater than 50% respiratory variability, suggesting right atrial pressure of 3 mmHg.  Comparison(s): Prior images unable to be directly viewed, comparison made by report only. 07/06/14 EF 40%; cannot see prior images but by comparison EF improved.  Conclusion(s)/Recommendation(s): Otherwise normal echocardiogram, with minor abnormalities described in the report.  FINDINGS Left Ventricle: Left ventricular ejection fraction, by estimation, is 50 to 55%. The left ventricle has low normal function. The left ventricle has no regional wall motion  abnormalities. Definity contrast agent was given IV to delineate the left ventricular endocardial borders. The left ventricular internal cavity size was mildly dilated. There is no left ventricular hypertrophy. Left ventricular diastolic parameters are consistent with Grade II diastolic dysfunction (pseudonormalization).  Right Ventricle: The right ventricular size is normal. Right vetricular wall thickness was not well visualized. Right ventricular systolic function is normal.  Left Atrium: Left atrial size was mild to moderately dilated.  Right Atrium: Right atrial size was mildly dilated.  Pericardium: There is no evidence of pericardial effusion. Presence of pericardial fat pad.  Mitral Valve: The mitral valve is normal in structure. Trivial mitral valve regurgitation.  No evidence of mitral valve stenosis.  Tricuspid Valve: The tricuspid valve is normal in structure. Tricuspid valve regurgitation is trivial.  Aortic Valve: The aortic valve is tricuspid. There is mild calcification of the aortic valve. Aortic valve regurgitation is not visualized. Mild aortic valve sclerosis is present, with no evidence of aortic valve stenosis.  Pulmonic Valve: The pulmonic valve was grossly normal. Pulmonic valve regurgitation is not visualized. No evidence of pulmonic stenosis.  Aorta: The aortic root, ascending aorta and aortic arch are all structurally normal, with no evidence of dilitation or obstruction.  Venous: The inferior vena cava is normal in size with greater than 50% respiratory variability, suggesting right atrial pressure of 3 mmHg.  IAS/Shunts: The atrial septum is grossly normal.   LEFT VENTRICLE PLAX 2D LVIDd:         5.80 cm  Diastology LVIDs:         3.80 cm  LV e' medial:    6.93 cm/s LV PW:         0.90 cm  LV E/e' medial:  17.7 LV IVS:        1.10 cm  LV e' lateral:   8.62 cm/s LVOT diam:     2.00 cm  LV E/e' lateral: 14.3 LV SV:         124 LV SV Index:   62 LVOT Area:      3.14 cm   RIGHT VENTRICLE RV Basal diam:  3.90 cm RV S prime:     9.93 cm/s TAPSE (M-mode): 3.4 cm  LEFT ATRIUM             Index       RIGHT ATRIUM           Index LA diam:        4.80 cm 2.39 cm/m  RA Pressure: 3.00 mmHg LA Vol (A2C):   51.9 ml 25.89 ml/m RA Area:     19.30 cm LA Vol (A4C):   79.9 ml 39.86 ml/m RA Volume:   54.00 ml  26.94 ml/m LA Biplane Vol: 64.9 ml 32.38 ml/m AORTIC VALVE LVOT Vmax:   158.00 cm/s LVOT Vmean:  96.700 cm/s LVOT VTI:    0.395 m  AORTA Ao Root diam: 3.00 cm Ao Asc diam:  2.80 cm  MITRAL VALVE                TRICUSPID VALVE Estimated RAP:  3.00 mmHg  MV E velocity: 123.00 cm/s  SHUNTS MV A velocity: 86.30 cm/s   Systemic VTI:  0.40 m MV E/A ratio:  1.43         Systemic Diam: 2.00 cm  Jodelle Red MD Electronically signed by Jodelle Red MD Signature Date/Time: 10/24/2020/9:05:06 PM    Final     Imaging studies that I have independently reviewed today: ***  Recent Labs: 04/02/2021: ALT 12; BUN 48; Creatinine, Ser 1.35; Hemoglobin 13.0; Platelets 167.0; Potassium 5.4 No hemolysis seen; Pro B Natriuretic peptide (BNP) 181.0; Sodium 141; TSH 1.52   Recent Lipid Panel Lab Results  Component Value Date/Time   CHOL 153 04/02/2021 03:55 PM   TRIG 97.0 04/02/2021 03:55 PM   HDL 45.40 04/02/2021 03:55 PM   LDLCALC 88 04/02/2021 03:55 PM   LDLDIRECT 74.0 12/06/2015 03:32 PM    Risk Assessment/Calculations:    {Does this patient have ATRIAL FIBRILLATION?:[phone]}      Physical Exam:    VS:  There were no vitals taken for this visit.   Wt  Readings from Last 3 Encounters:  04/02/21 248 lb (112.5 kg)  08/14/20 230 lb (104.3 kg)  03/15/20 214 lb (97.1 kg)    GENERAL:  No apparent distress, AOx3 HEENT:  No carotid bruits, +2 carotid impulses, no scleral icterus CAR: RRR Irregular RR*** no murmurs***, gallops, rubs, or thrills RES:  Clear to auscultation bilaterally ABD:  Soft, nontender, nondistended,  positive bowel sounds x 4 VASC:  +2 radial pulses, +2 carotid pulses, palpable pedal pulses NEURO:  CN 2-12 grossly intact; motor and sensory grossly intact PSYCH:  No active depression or anxiety EXT:  No edema, ecchymosis, or cyanosis  Signed, Early Osmond, MD  05/24/2021 8:34 AM    [address], [place], [place]  [address] Phone: [phone]; Fax: [phone]   Note:  This document was prepared using Dragon voice recognition software and may include unintentional dictation errors.

## 2021-05-27 ENCOUNTER — Ambulatory Visit: Payer: Medicare HMO | Admitting: Internal Medicine

## 2021-05-27 DIAGNOSIS — D649 Anemia, unspecified: Secondary | ICD-10-CM | POA: Diagnosis not present

## 2021-05-27 DIAGNOSIS — M810 Age-related osteoporosis without current pathological fracture: Secondary | ICD-10-CM | POA: Diagnosis not present

## 2021-05-27 DIAGNOSIS — I10 Essential (primary) hypertension: Secondary | ICD-10-CM | POA: Diagnosis not present

## 2021-05-27 DIAGNOSIS — F419 Anxiety disorder, unspecified: Secondary | ICD-10-CM | POA: Diagnosis not present

## 2021-05-27 DIAGNOSIS — M545 Low back pain, unspecified: Secondary | ICD-10-CM | POA: Diagnosis not present

## 2021-05-27 DIAGNOSIS — F0390 Unspecified dementia without behavioral disturbance: Secondary | ICD-10-CM | POA: Diagnosis not present

## 2021-05-27 DIAGNOSIS — E119 Type 2 diabetes mellitus without complications: Secondary | ICD-10-CM | POA: Diagnosis not present

## 2021-05-27 DIAGNOSIS — K5790 Diverticulosis of intestine, part unspecified, without perforation or abscess without bleeding: Secondary | ICD-10-CM | POA: Diagnosis not present

## 2021-05-27 DIAGNOSIS — I5032 Chronic diastolic (congestive) heart failure: Secondary | ICD-10-CM

## 2021-05-27 DIAGNOSIS — E785 Hyperlipidemia, unspecified: Secondary | ICD-10-CM | POA: Diagnosis not present

## 2021-06-01 DIAGNOSIS — M545 Low back pain, unspecified: Secondary | ICD-10-CM | POA: Diagnosis not present

## 2021-06-01 DIAGNOSIS — S32038A Other fracture of third lumbar vertebra, initial encounter for closed fracture: Secondary | ICD-10-CM | POA: Diagnosis not present

## 2021-06-01 DIAGNOSIS — R2689 Other abnormalities of gait and mobility: Secondary | ICD-10-CM | POA: Diagnosis not present

## 2021-06-01 DIAGNOSIS — Y998 Other external cause status: Secondary | ICD-10-CM | POA: Diagnosis not present

## 2021-06-01 DIAGNOSIS — M1711 Unilateral primary osteoarthritis, right knee: Secondary | ICD-10-CM | POA: Diagnosis not present

## 2021-06-01 DIAGNOSIS — E119 Type 2 diabetes mellitus without complications: Secondary | ICD-10-CM | POA: Diagnosis not present

## 2021-06-01 DIAGNOSIS — M48061 Spinal stenosis, lumbar region without neurogenic claudication: Secondary | ICD-10-CM | POA: Diagnosis not present

## 2021-06-01 DIAGNOSIS — W1839XA Other fall on same level, initial encounter: Secondary | ICD-10-CM | POA: Diagnosis not present

## 2021-06-01 DIAGNOSIS — M25551 Pain in right hip: Secondary | ICD-10-CM | POA: Diagnosis not present

## 2021-06-01 DIAGNOSIS — R52 Pain, unspecified: Secondary | ICD-10-CM | POA: Diagnosis not present

## 2021-06-01 DIAGNOSIS — M170 Bilateral primary osteoarthritis of knee: Secondary | ICD-10-CM | POA: Diagnosis not present

## 2021-06-01 DIAGNOSIS — I5030 Unspecified diastolic (congestive) heart failure: Secondary | ICD-10-CM | POA: Diagnosis not present

## 2021-06-01 DIAGNOSIS — Z20822 Contact with and (suspected) exposure to covid-19: Secondary | ICD-10-CM | POA: Diagnosis not present

## 2021-06-01 DIAGNOSIS — I10 Essential (primary) hypertension: Secondary | ICD-10-CM | POA: Diagnosis not present

## 2021-06-01 DIAGNOSIS — N179 Acute kidney failure, unspecified: Secondary | ICD-10-CM | POA: Diagnosis not present

## 2021-06-01 DIAGNOSIS — M2578 Osteophyte, vertebrae: Secondary | ICD-10-CM | POA: Diagnosis not present

## 2021-06-01 DIAGNOSIS — I110 Hypertensive heart disease with heart failure: Secondary | ICD-10-CM | POA: Diagnosis not present

## 2021-06-01 DIAGNOSIS — E785 Hyperlipidemia, unspecified: Secondary | ICD-10-CM | POA: Diagnosis not present

## 2021-06-01 DIAGNOSIS — Z743 Need for continuous supervision: Secondary | ICD-10-CM | POA: Diagnosis not present

## 2021-06-01 DIAGNOSIS — S32030A Wedge compression fracture of third lumbar vertebra, initial encounter for closed fracture: Secondary | ICD-10-CM | POA: Diagnosis not present

## 2021-06-01 DIAGNOSIS — M5136 Other intervertebral disc degeneration, lumbar region: Secondary | ICD-10-CM | POA: Diagnosis not present

## 2021-06-01 DIAGNOSIS — M25561 Pain in right knee: Secondary | ICD-10-CM | POA: Diagnosis not present

## 2021-06-02 DIAGNOSIS — M48061 Spinal stenosis, lumbar region without neurogenic claudication: Secondary | ICD-10-CM | POA: Diagnosis not present

## 2021-06-02 DIAGNOSIS — E785 Hyperlipidemia, unspecified: Secondary | ICD-10-CM | POA: Diagnosis not present

## 2021-06-02 DIAGNOSIS — M25551 Pain in right hip: Secondary | ICD-10-CM | POA: Diagnosis not present

## 2021-06-02 DIAGNOSIS — N179 Acute kidney failure, unspecified: Secondary | ICD-10-CM | POA: Diagnosis not present

## 2021-06-02 DIAGNOSIS — I5030 Unspecified diastolic (congestive) heart failure: Secondary | ICD-10-CM | POA: Diagnosis not present

## 2021-06-02 DIAGNOSIS — S32030A Wedge compression fracture of third lumbar vertebra, initial encounter for closed fracture: Secondary | ICD-10-CM | POA: Diagnosis not present

## 2021-06-02 DIAGNOSIS — E119 Type 2 diabetes mellitus without complications: Secondary | ICD-10-CM | POA: Diagnosis not present

## 2021-06-02 DIAGNOSIS — M25561 Pain in right knee: Secondary | ICD-10-CM | POA: Diagnosis not present

## 2021-06-02 DIAGNOSIS — I110 Hypertensive heart disease with heart failure: Secondary | ICD-10-CM | POA: Diagnosis not present

## 2021-06-03 DIAGNOSIS — M25561 Pain in right knee: Secondary | ICD-10-CM | POA: Diagnosis not present

## 2021-06-03 DIAGNOSIS — E785 Hyperlipidemia, unspecified: Secondary | ICD-10-CM | POA: Diagnosis not present

## 2021-06-03 DIAGNOSIS — M25551 Pain in right hip: Secondary | ICD-10-CM | POA: Diagnosis not present

## 2021-06-03 DIAGNOSIS — I110 Hypertensive heart disease with heart failure: Secondary | ICD-10-CM | POA: Diagnosis not present

## 2021-06-03 DIAGNOSIS — N179 Acute kidney failure, unspecified: Secondary | ICD-10-CM | POA: Diagnosis not present

## 2021-06-03 DIAGNOSIS — E119 Type 2 diabetes mellitus without complications: Secondary | ICD-10-CM | POA: Diagnosis not present

## 2021-06-03 DIAGNOSIS — I5030 Unspecified diastolic (congestive) heart failure: Secondary | ICD-10-CM | POA: Diagnosis not present

## 2021-06-03 DIAGNOSIS — M48061 Spinal stenosis, lumbar region without neurogenic claudication: Secondary | ICD-10-CM | POA: Diagnosis not present

## 2021-06-03 DIAGNOSIS — S32030A Wedge compression fracture of third lumbar vertebra, initial encounter for closed fracture: Secondary | ICD-10-CM | POA: Diagnosis not present

## 2021-06-04 DIAGNOSIS — M25551 Pain in right hip: Secondary | ICD-10-CM | POA: Diagnosis not present

## 2021-06-04 DIAGNOSIS — S32030A Wedge compression fracture of third lumbar vertebra, initial encounter for closed fracture: Secondary | ICD-10-CM | POA: Diagnosis not present

## 2021-06-04 DIAGNOSIS — E860 Dehydration: Secondary | ICD-10-CM | POA: Diagnosis not present

## 2021-06-04 DIAGNOSIS — R269 Unspecified abnormalities of gait and mobility: Secondary | ICD-10-CM | POA: Diagnosis not present

## 2021-06-04 DIAGNOSIS — Z8673 Personal history of transient ischemic attack (TIA), and cerebral infarction without residual deficits: Secondary | ICD-10-CM | POA: Diagnosis not present

## 2021-06-04 DIAGNOSIS — I10 Essential (primary) hypertension: Secondary | ICD-10-CM | POA: Diagnosis not present

## 2021-06-04 DIAGNOSIS — M25561 Pain in right knee: Secondary | ICD-10-CM | POA: Diagnosis not present

## 2021-06-04 DIAGNOSIS — I5030 Unspecified diastolic (congestive) heart failure: Secondary | ICD-10-CM | POA: Diagnosis not present

## 2021-06-04 DIAGNOSIS — R279 Unspecified lack of coordination: Secondary | ICD-10-CM | POA: Diagnosis not present

## 2021-06-04 DIAGNOSIS — E1159 Type 2 diabetes mellitus with other circulatory complications: Secondary | ICD-10-CM | POA: Diagnosis not present

## 2021-06-04 DIAGNOSIS — Z79899 Other long term (current) drug therapy: Secondary | ICD-10-CM | POA: Diagnosis not present

## 2021-06-04 DIAGNOSIS — R2689 Other abnormalities of gait and mobility: Secondary | ICD-10-CM | POA: Diagnosis not present

## 2021-06-04 DIAGNOSIS — Z20822 Contact with and (suspected) exposure to covid-19: Secondary | ICD-10-CM | POA: Diagnosis not present

## 2021-06-04 DIAGNOSIS — E785 Hyperlipidemia, unspecified: Secondary | ICD-10-CM | POA: Diagnosis not present

## 2021-06-04 DIAGNOSIS — E119 Type 2 diabetes mellitus without complications: Secondary | ICD-10-CM | POA: Diagnosis not present

## 2021-06-04 DIAGNOSIS — Z743 Need for continuous supervision: Secondary | ICD-10-CM | POA: Diagnosis not present

## 2021-06-04 DIAGNOSIS — M170 Bilateral primary osteoarthritis of knee: Secondary | ICD-10-CM | POA: Diagnosis not present

## 2021-06-04 DIAGNOSIS — N179 Acute kidney failure, unspecified: Secondary | ICD-10-CM | POA: Diagnosis not present

## 2021-06-04 DIAGNOSIS — I110 Hypertensive heart disease with heart failure: Secondary | ICD-10-CM | POA: Diagnosis not present

## 2021-06-04 DIAGNOSIS — R296 Repeated falls: Secondary | ICD-10-CM | POA: Diagnosis not present

## 2021-06-04 DIAGNOSIS — M545 Low back pain, unspecified: Secondary | ICD-10-CM | POA: Diagnosis not present

## 2021-06-04 DIAGNOSIS — M48061 Spinal stenosis, lumbar region without neurogenic claudication: Secondary | ICD-10-CM | POA: Diagnosis not present

## 2021-06-04 DIAGNOSIS — E559 Vitamin D deficiency, unspecified: Secondary | ICD-10-CM | POA: Diagnosis not present

## 2021-06-04 DIAGNOSIS — M5136 Other intervertebral disc degeneration, lumbar region: Secondary | ICD-10-CM | POA: Diagnosis not present

## 2021-06-04 DIAGNOSIS — S32030D Wedge compression fracture of third lumbar vertebra, subsequent encounter for fracture with routine healing: Secondary | ICD-10-CM | POA: Diagnosis not present

## 2021-06-06 ENCOUNTER — Other Ambulatory Visit: Payer: Self-pay | Admitting: Internal Medicine

## 2021-06-07 DIAGNOSIS — R269 Unspecified abnormalities of gait and mobility: Secondary | ICD-10-CM | POA: Diagnosis not present

## 2021-06-07 DIAGNOSIS — R296 Repeated falls: Secondary | ICD-10-CM | POA: Diagnosis not present

## 2021-06-07 DIAGNOSIS — I5030 Unspecified diastolic (congestive) heart failure: Secondary | ICD-10-CM | POA: Diagnosis not present

## 2021-06-07 DIAGNOSIS — E1159 Type 2 diabetes mellitus with other circulatory complications: Secondary | ICD-10-CM | POA: Diagnosis not present

## 2021-08-04 ENCOUNTER — Ambulatory Visit: Payer: Medicare HMO | Admitting: Podiatry

## 2021-08-11 ENCOUNTER — Ambulatory Visit: Payer: Medicare HMO | Admitting: Podiatry

## 2021-08-13 ENCOUNTER — Ambulatory Visit (INDEPENDENT_AMBULATORY_CARE_PROVIDER_SITE_OTHER): Payer: Medicare HMO | Admitting: Podiatry

## 2021-08-13 DIAGNOSIS — Z91199 Patient's noncompliance with other medical treatment and regimen due to unspecified reason: Secondary | ICD-10-CM

## 2021-08-17 NOTE — Progress Notes (Signed)
   Complete physical exam  Patient: Kathy Thomas   DOB: 03/14/1997   87 y.o. Female  MRN: [id number]  Subjective:    No chief complaint on file.   Kathy Thomas is a 87 y.o. female who presents today for a complete physical exam. She reports consuming a {diet types:17450} diet. {types:19826} She generally feels {DESC; WELL/FAIRLY WELL/POORLY:18703}. She reports sleeping {DESC; WELL/FAIRLY WELL/POORLY:18703}. She {does/does not:200015} have additional problems to discuss today.    Most recent fall risk assessment:    11/19/2021   10:42 AM  Fall Risk   Falls in the past year? 0  Number falls in past yr: 0  Injury with Fall? 0  Risk for fall due to : No Fall Risks  Follow up Falls evaluation completed     Most recent depression screenings:    11/19/2021   10:42 AM 10/10/2020   10:46 AM  PHQ 2/9 Scores  PHQ - 2 Score 0 0  PHQ- 9 Score 5     {VISON DENTAL STD PSA (Optional):27386}  {History (Optional):23778}  Patient Care Team: Jessup, Joy, NP as PCP - General (Nurse Practitioner)   Outpatient Medications Prior to Visit  Medication Sig   fluticasone (FLONASE) 50 MCG/ACT nasal spray Place 2 sprays into both nostrils in the morning and at bedtime. After 7 days, reduce to once daily.   norgestimate-ethinyl estradiol (SPRINTEC 28) 0.25-35 MG-MCG tablet Take 1 tablet by mouth daily.   Nystatin POWD Apply liberally to affected area 2 times per day   spironolactone (ALDACTONE) 100 MG tablet Take 1 tablet (100 mg total) by mouth daily.   No facility-administered medications prior to visit.    ROS        Objective:     There were no vitals taken for this visit. {Vitals History (Optional):23777}  Physical Exam   No results found for any visits on 12/25/21. {Show previous labs (optional):23779}    Assessment & Plan:    Routine Health Maintenance and Physical Exam  Immunization History  Administered Date(s) Administered   DTaP 05/28/1999, 07/24/1999,  10/02/1999, 06/17/2000, 01/01/2004   Hepatitis A 10/28/2007, 11/02/2008   Hepatitis B 03/15/1999, 04/22/1999, 10/02/1999   HiB (PRP-OMP) 05/28/1999, 07/24/1999, 10/02/1999, 06/17/2000   IPV 05/28/1999, 07/24/1999, 03/22/2000, 01/01/2004   Influenza,inj,Quad PF,6+ Mos 02/02/2014   Influenza-Unspecified 05/04/2012   MMR 03/22/2001, 01/01/2004   Meningococcal Polysaccharide 11/02/2011   Pneumococcal Conjugate-13 06/17/2000   Pneumococcal-Unspecified 10/02/1999, 12/16/1999   Tdap 11/02/2011   Varicella 03/22/2000, 10/28/2007    Health Maintenance  Topic Date Due   HIV Screening  Never done   Hepatitis C Screening  Never done   INFLUENZA VACCINE  12/23/2021   PAP-Cervical Cytology Screening  12/25/2021 (Originally 03/13/2020)   PAP SMEAR-Modifier  12/25/2021 (Originally 03/13/2020)   TETANUS/TDAP  12/25/2021 (Originally 11/01/2021)   HPV VACCINES  Discontinued   COVID-19 Vaccine  Discontinued    Discussed health benefits of physical activity, and encouraged her to engage in regular exercise appropriate for her age and condition.  Problem List Items Addressed This Visit   None Visit Diagnoses     Annual physical exam    -  Primary   Cervical cancer screening       Need for Tdap vaccination          No follow-ups on file.     Joy Jessup, NP   

## 2021-08-21 DIAGNOSIS — L602 Onychogryphosis: Secondary | ICD-10-CM | POA: Diagnosis not present

## 2021-08-21 DIAGNOSIS — I739 Peripheral vascular disease, unspecified: Secondary | ICD-10-CM | POA: Diagnosis not present

## 2021-09-10 ENCOUNTER — Telehealth: Payer: Self-pay | Admitting: Internal Medicine

## 2021-09-10 NOTE — Telephone Encounter (Signed)
N/A unable to leave a message for patient to call back to schedule Medicare Annual Wellness Visit  ? ?Last AWV  10/20/19 ? ?Please schedule at anytime with [facility] Advisor if patient calls the office back.   ? ?  ? ?Any questions, please call me at [phone]  ?

## 2021-10-02 ENCOUNTER — Ambulatory Visit: Payer: Medicare HMO | Admitting: Internal Medicine

## 2021-10-04 DIAGNOSIS — Z79899 Other long term (current) drug therapy: Secondary | ICD-10-CM | POA: Diagnosis not present

## 2021-10-09 DIAGNOSIS — R0602 Shortness of breath: Secondary | ICD-10-CM | POA: Diagnosis not present

## 2021-10-23 DIAGNOSIS — I10 Essential (primary) hypertension: Secondary | ICD-10-CM | POA: Diagnosis not present

## 2021-10-23 DIAGNOSIS — R0602 Shortness of breath: Secondary | ICD-10-CM | POA: Diagnosis not present

## 2021-10-23 DIAGNOSIS — I5032 Chronic diastolic (congestive) heart failure: Secondary | ICD-10-CM | POA: Diagnosis not present

## 2021-10-28 DIAGNOSIS — Z79899 Other long term (current) drug therapy: Secondary | ICD-10-CM | POA: Diagnosis not present

## 2021-10-28 DIAGNOSIS — I5032 Chronic diastolic (congestive) heart failure: Secondary | ICD-10-CM | POA: Diagnosis not present

## 2021-11-04 DIAGNOSIS — I509 Heart failure, unspecified: Secondary | ICD-10-CM | POA: Diagnosis not present

## 2021-11-11 DIAGNOSIS — I509 Heart failure, unspecified: Secondary | ICD-10-CM | POA: Diagnosis not present

## 2021-11-18 DIAGNOSIS — I509 Heart failure, unspecified: Secondary | ICD-10-CM | POA: Diagnosis not present

## 2021-11-18 DIAGNOSIS — Z79899 Other long term (current) drug therapy: Secondary | ICD-10-CM | POA: Diagnosis not present

## 2021-11-27 DIAGNOSIS — H903 Sensorineural hearing loss, bilateral: Secondary | ICD-10-CM | POA: Diagnosis not present

## 2021-12-11 DIAGNOSIS — I739 Peripheral vascular disease, unspecified: Secondary | ICD-10-CM | POA: Diagnosis not present

## 2021-12-11 DIAGNOSIS — B351 Tinea unguium: Secondary | ICD-10-CM | POA: Diagnosis not present

## 2021-12-11 DIAGNOSIS — L602 Onychogryphosis: Secondary | ICD-10-CM | POA: Diagnosis not present

## 2021-12-18 DIAGNOSIS — F0390 Unspecified dementia without behavioral disturbance: Secondary | ICD-10-CM | POA: Diagnosis not present

## 2021-12-18 DIAGNOSIS — E119 Type 2 diabetes mellitus without complications: Secondary | ICD-10-CM | POA: Diagnosis not present

## 2021-12-18 DIAGNOSIS — Z961 Presence of intraocular lens: Secondary | ICD-10-CM | POA: Diagnosis not present

## 2021-12-25 ENCOUNTER — Ambulatory Visit: Payer: Self-pay | Admitting: Licensed Clinical Social Worker

## 2021-12-25 NOTE — Patient Outreach (Signed)
  Care Coordination  Initial Visit Note   12/25/2021 Name: Kathy Thomas MRN: [id number] DOB: [birth year]  Kathy Thomas is a 87 y.o. year old female who sees Kathy Levins, MD for primary care. I  spoke with patient's daughter Kathy Thomas.  What matters to the patients health and wellness today?  Patient is living in a LTC facility    Goals Addressed   None    SDOH assessments and interventions completed:  No   Care Coordination Interventions Activated:  No  Care Coordination Interventions:  No, not indicated   Follow up plan: No further intervention required.   Encounter Outcome:  Pt. Refused   Kathy Hines, LCSW Care Coordination  [facility] [phone]

## 2022-01-07 DIAGNOSIS — L988 Other specified disorders of the skin and subcutaneous tissue: Secondary | ICD-10-CM | POA: Diagnosis not present

## 2022-01-20 DIAGNOSIS — S32030D Wedge compression fracture of third lumbar vertebra, subsequent encounter for fracture with routine healing: Secondary | ICD-10-CM | POA: Diagnosis not present

## 2022-01-20 DIAGNOSIS — R2681 Unsteadiness on feet: Secondary | ICD-10-CM | POA: Diagnosis not present

## 2022-01-20 DIAGNOSIS — R293 Abnormal posture: Secondary | ICD-10-CM | POA: Diagnosis not present

## 2022-01-20 DIAGNOSIS — Z4789 Encounter for other orthopedic aftercare: Secondary | ICD-10-CM | POA: Diagnosis not present

## 2022-01-20 DIAGNOSIS — R278 Other lack of coordination: Secondary | ICD-10-CM | POA: Diagnosis not present

## 2022-01-20 DIAGNOSIS — M6281 Muscle weakness (generalized): Secondary | ICD-10-CM | POA: Diagnosis not present

## 2022-01-26 DIAGNOSIS — S32030D Wedge compression fracture of third lumbar vertebra, subsequent encounter for fracture with routine healing: Secondary | ICD-10-CM | POA: Diagnosis not present

## 2022-01-26 DIAGNOSIS — R2681 Unsteadiness on feet: Secondary | ICD-10-CM | POA: Diagnosis not present

## 2022-01-26 DIAGNOSIS — R278 Other lack of coordination: Secondary | ICD-10-CM | POA: Diagnosis not present

## 2022-01-26 DIAGNOSIS — M6281 Muscle weakness (generalized): Secondary | ICD-10-CM | POA: Diagnosis not present

## 2022-01-26 DIAGNOSIS — R293 Abnormal posture: Secondary | ICD-10-CM | POA: Diagnosis not present

## 2022-01-28 DIAGNOSIS — R293 Abnormal posture: Secondary | ICD-10-CM | POA: Diagnosis not present

## 2022-01-28 DIAGNOSIS — R2681 Unsteadiness on feet: Secondary | ICD-10-CM | POA: Diagnosis not present

## 2022-01-28 DIAGNOSIS — M6281 Muscle weakness (generalized): Secondary | ICD-10-CM | POA: Diagnosis not present

## 2022-01-28 DIAGNOSIS — S32030D Wedge compression fracture of third lumbar vertebra, subsequent encounter for fracture with routine healing: Secondary | ICD-10-CM | POA: Diagnosis not present

## 2022-01-28 DIAGNOSIS — R278 Other lack of coordination: Secondary | ICD-10-CM | POA: Diagnosis not present

## 2022-02-03 DIAGNOSIS — R2681 Unsteadiness on feet: Secondary | ICD-10-CM | POA: Diagnosis not present

## 2022-02-03 DIAGNOSIS — M6281 Muscle weakness (generalized): Secondary | ICD-10-CM | POA: Diagnosis not present

## 2022-02-03 DIAGNOSIS — R293 Abnormal posture: Secondary | ICD-10-CM | POA: Diagnosis not present

## 2022-02-03 DIAGNOSIS — S32030D Wedge compression fracture of third lumbar vertebra, subsequent encounter for fracture with routine healing: Secondary | ICD-10-CM | POA: Diagnosis not present

## 2022-02-03 DIAGNOSIS — R278 Other lack of coordination: Secondary | ICD-10-CM | POA: Diagnosis not present

## 2022-02-20 DIAGNOSIS — Z23 Encounter for immunization: Secondary | ICD-10-CM | POA: Diagnosis not present

## 2022-03-08 DIAGNOSIS — I10 Essential (primary) hypertension: Secondary | ICD-10-CM | POA: Diagnosis not present

## 2022-03-08 DIAGNOSIS — I5032 Chronic diastolic (congestive) heart failure: Secondary | ICD-10-CM | POA: Diagnosis not present

## 2022-03-08 DIAGNOSIS — Z79899 Other long term (current) drug therapy: Secondary | ICD-10-CM | POA: Diagnosis not present

## 2022-03-08 DIAGNOSIS — E559 Vitamin D deficiency, unspecified: Secondary | ICD-10-CM | POA: Diagnosis not present

## 2022-03-08 DIAGNOSIS — I509 Heart failure, unspecified: Secondary | ICD-10-CM | POA: Diagnosis not present

## 2022-03-09 ENCOUNTER — Telehealth: Payer: Self-pay | Admitting: Internal Medicine

## 2022-03-09 DIAGNOSIS — I5032 Chronic diastolic (congestive) heart failure: Secondary | ICD-10-CM | POA: Diagnosis not present

## 2022-03-09 DIAGNOSIS — I509 Heart failure, unspecified: Secondary | ICD-10-CM | POA: Diagnosis not present

## 2022-03-09 DIAGNOSIS — E559 Vitamin D deficiency, unspecified: Secondary | ICD-10-CM | POA: Diagnosis not present

## 2022-03-09 DIAGNOSIS — Z79899 Other long term (current) drug therapy: Secondary | ICD-10-CM | POA: Diagnosis not present

## 2022-03-09 DIAGNOSIS — I10 Essential (primary) hypertension: Secondary | ICD-10-CM | POA: Diagnosis not present

## 2022-03-09 NOTE — Telephone Encounter (Signed)
Left message for patient to call back to schedule Medicare Annual Wellness Visit   Last AWV  10/20/19  Please schedule at anytime with [facility] if patient calls the office back.      Any questions, please call me at [phone]

## 2022-03-16 DIAGNOSIS — I10 Essential (primary) hypertension: Secondary | ICD-10-CM | POA: Diagnosis not present

## 2022-03-23 DIAGNOSIS — I10 Essential (primary) hypertension: Secondary | ICD-10-CM | POA: Diagnosis not present

## 2022-03-30 DIAGNOSIS — R5382 Chronic fatigue, unspecified: Secondary | ICD-10-CM | POA: Diagnosis not present

## 2022-05-12 DIAGNOSIS — L603 Nail dystrophy: Secondary | ICD-10-CM | POA: Diagnosis not present

## 2022-05-12 DIAGNOSIS — I739 Peripheral vascular disease, unspecified: Secondary | ICD-10-CM | POA: Diagnosis not present

## 2022-07-14 DIAGNOSIS — I739 Peripheral vascular disease, unspecified: Secondary | ICD-10-CM | POA: Diagnosis not present

## 2022-07-14 DIAGNOSIS — L603 Nail dystrophy: Secondary | ICD-10-CM | POA: Diagnosis not present

## 2022-07-14 DIAGNOSIS — B351 Tinea unguium: Secondary | ICD-10-CM | POA: Diagnosis not present

## 2022-07-17 DIAGNOSIS — E1159 Type 2 diabetes mellitus with other circulatory complications: Secondary | ICD-10-CM | POA: Diagnosis not present

## 2022-07-17 DIAGNOSIS — R21 Rash and other nonspecific skin eruption: Secondary | ICD-10-CM | POA: Diagnosis not present

## 2022-07-17 DIAGNOSIS — K1379 Other lesions of oral mucosa: Secondary | ICD-10-CM | POA: Diagnosis not present

## 2022-07-17 DIAGNOSIS — I5030 Unspecified diastolic (congestive) heart failure: Secondary | ICD-10-CM | POA: Diagnosis not present

## 2022-07-18 DIAGNOSIS — Z79899 Other long term (current) drug therapy: Secondary | ICD-10-CM | POA: Diagnosis not present

## 2022-07-18 DIAGNOSIS — I5032 Chronic diastolic (congestive) heart failure: Secondary | ICD-10-CM | POA: Diagnosis not present

## 2022-07-22 DIAGNOSIS — L988 Other specified disorders of the skin and subcutaneous tissue: Secondary | ICD-10-CM | POA: Diagnosis not present

## 2022-07-29 DIAGNOSIS — L988 Other specified disorders of the skin and subcutaneous tissue: Secondary | ICD-10-CM | POA: Diagnosis not present

## 2022-08-15 DIAGNOSIS — E1159 Type 2 diabetes mellitus with other circulatory complications: Secondary | ICD-10-CM | POA: Diagnosis not present

## 2022-08-15 DIAGNOSIS — R21 Rash and other nonspecific skin eruption: Secondary | ICD-10-CM | POA: Diagnosis not present

## 2022-08-15 DIAGNOSIS — I5030 Unspecified diastolic (congestive) heart failure: Secondary | ICD-10-CM | POA: Diagnosis not present

## 2022-08-15 DIAGNOSIS — F0390 Unspecified dementia without behavioral disturbance: Secondary | ICD-10-CM | POA: Diagnosis not present

## 2022-09-02 DIAGNOSIS — E119 Type 2 diabetes mellitus without complications: Secondary | ICD-10-CM | POA: Diagnosis not present

## 2022-10-20 DIAGNOSIS — B351 Tinea unguium: Secondary | ICD-10-CM | POA: Diagnosis not present

## 2022-10-20 DIAGNOSIS — I739 Peripheral vascular disease, unspecified: Secondary | ICD-10-CM | POA: Diagnosis not present

## 2022-10-20 DIAGNOSIS — L603 Nail dystrophy: Secondary | ICD-10-CM | POA: Diagnosis not present

## 2022-11-11 DIAGNOSIS — F0390 Unspecified dementia without behavioral disturbance: Secondary | ICD-10-CM | POA: Diagnosis not present

## 2022-11-11 DIAGNOSIS — E1159 Type 2 diabetes mellitus with other circulatory complications: Secondary | ICD-10-CM | POA: Diagnosis not present

## 2022-11-11 DIAGNOSIS — R21 Rash and other nonspecific skin eruption: Secondary | ICD-10-CM | POA: Diagnosis not present

## 2022-11-11 DIAGNOSIS — I5030 Unspecified diastolic (congestive) heart failure: Secondary | ICD-10-CM | POA: Diagnosis not present

## 2022-11-30 DIAGNOSIS — Z79899 Other long term (current) drug therapy: Secondary | ICD-10-CM | POA: Diagnosis not present

## 2022-12-21 DIAGNOSIS — M6281 Muscle weakness (generalized): Secondary | ICD-10-CM | POA: Diagnosis not present

## 2022-12-21 DIAGNOSIS — R2681 Unsteadiness on feet: Secondary | ICD-10-CM | POA: Diagnosis not present

## 2022-12-21 DIAGNOSIS — Z79899 Other long term (current) drug therapy: Secondary | ICD-10-CM | POA: Diagnosis not present

## 2022-12-21 DIAGNOSIS — E119 Type 2 diabetes mellitus without complications: Secondary | ICD-10-CM | POA: Diagnosis not present

## 2022-12-21 DIAGNOSIS — I5030 Unspecified diastolic (congestive) heart failure: Secondary | ICD-10-CM | POA: Diagnosis not present

## 2022-12-22 DIAGNOSIS — E119 Type 2 diabetes mellitus without complications: Secondary | ICD-10-CM | POA: Diagnosis not present

## 2022-12-22 DIAGNOSIS — M6281 Muscle weakness (generalized): Secondary | ICD-10-CM | POA: Diagnosis not present

## 2022-12-22 DIAGNOSIS — E875 Hyperkalemia: Secondary | ICD-10-CM | POA: Diagnosis not present

## 2022-12-22 DIAGNOSIS — I5030 Unspecified diastolic (congestive) heart failure: Secondary | ICD-10-CM | POA: Diagnosis not present

## 2022-12-22 DIAGNOSIS — R7989 Other specified abnormal findings of blood chemistry: Secondary | ICD-10-CM | POA: Diagnosis not present

## 2022-12-22 DIAGNOSIS — R2681 Unsteadiness on feet: Secondary | ICD-10-CM | POA: Diagnosis not present

## 2022-12-23 DIAGNOSIS — E119 Type 2 diabetes mellitus without complications: Secondary | ICD-10-CM | POA: Diagnosis not present

## 2022-12-23 DIAGNOSIS — R2681 Unsteadiness on feet: Secondary | ICD-10-CM | POA: Diagnosis not present

## 2022-12-23 DIAGNOSIS — M6281 Muscle weakness (generalized): Secondary | ICD-10-CM | POA: Diagnosis not present

## 2022-12-23 DIAGNOSIS — I5030 Unspecified diastolic (congestive) heart failure: Secondary | ICD-10-CM | POA: Diagnosis not present

## 2022-12-24 DIAGNOSIS — M6281 Muscle weakness (generalized): Secondary | ICD-10-CM | POA: Diagnosis not present

## 2022-12-24 DIAGNOSIS — E119 Type 2 diabetes mellitus without complications: Secondary | ICD-10-CM | POA: Diagnosis not present

## 2022-12-24 DIAGNOSIS — I5030 Unspecified diastolic (congestive) heart failure: Secondary | ICD-10-CM | POA: Diagnosis not present

## 2022-12-24 DIAGNOSIS — R2681 Unsteadiness on feet: Secondary | ICD-10-CM | POA: Diagnosis not present

## 2022-12-25 DIAGNOSIS — M6281 Muscle weakness (generalized): Secondary | ICD-10-CM | POA: Diagnosis not present

## 2022-12-25 DIAGNOSIS — E119 Type 2 diabetes mellitus without complications: Secondary | ICD-10-CM | POA: Diagnosis not present

## 2022-12-25 DIAGNOSIS — R2681 Unsteadiness on feet: Secondary | ICD-10-CM | POA: Diagnosis not present

## 2022-12-25 DIAGNOSIS — I5030 Unspecified diastolic (congestive) heart failure: Secondary | ICD-10-CM | POA: Diagnosis not present

## 2022-12-28 DIAGNOSIS — E119 Type 2 diabetes mellitus without complications: Secondary | ICD-10-CM | POA: Diagnosis not present

## 2022-12-28 DIAGNOSIS — R2681 Unsteadiness on feet: Secondary | ICD-10-CM | POA: Diagnosis not present

## 2022-12-28 DIAGNOSIS — I5030 Unspecified diastolic (congestive) heart failure: Secondary | ICD-10-CM | POA: Diagnosis not present

## 2022-12-28 DIAGNOSIS — M6281 Muscle weakness (generalized): Secondary | ICD-10-CM | POA: Diagnosis not present

## 2022-12-30 DIAGNOSIS — R2681 Unsteadiness on feet: Secondary | ICD-10-CM | POA: Diagnosis not present

## 2022-12-30 DIAGNOSIS — M6281 Muscle weakness (generalized): Secondary | ICD-10-CM | POA: Diagnosis not present

## 2022-12-30 DIAGNOSIS — E119 Type 2 diabetes mellitus without complications: Secondary | ICD-10-CM | POA: Diagnosis not present

## 2022-12-30 DIAGNOSIS — I5030 Unspecified diastolic (congestive) heart failure: Secondary | ICD-10-CM | POA: Diagnosis not present

## 2022-12-31 DIAGNOSIS — I5030 Unspecified diastolic (congestive) heart failure: Secondary | ICD-10-CM | POA: Diagnosis not present

## 2022-12-31 DIAGNOSIS — E119 Type 2 diabetes mellitus without complications: Secondary | ICD-10-CM | POA: Diagnosis not present

## 2022-12-31 DIAGNOSIS — R2681 Unsteadiness on feet: Secondary | ICD-10-CM | POA: Diagnosis not present

## 2022-12-31 DIAGNOSIS — M6281 Muscle weakness (generalized): Secondary | ICD-10-CM | POA: Diagnosis not present

## 2023-01-05 DIAGNOSIS — I5030 Unspecified diastolic (congestive) heart failure: Secondary | ICD-10-CM | POA: Diagnosis not present

## 2023-01-05 DIAGNOSIS — M6281 Muscle weakness (generalized): Secondary | ICD-10-CM | POA: Diagnosis not present

## 2023-01-05 DIAGNOSIS — E119 Type 2 diabetes mellitus without complications: Secondary | ICD-10-CM | POA: Diagnosis not present

## 2023-01-05 DIAGNOSIS — R2681 Unsteadiness on feet: Secondary | ICD-10-CM | POA: Diagnosis not present

## 2023-01-06 DIAGNOSIS — I5030 Unspecified diastolic (congestive) heart failure: Secondary | ICD-10-CM | POA: Diagnosis not present

## 2023-01-06 DIAGNOSIS — M6281 Muscle weakness (generalized): Secondary | ICD-10-CM | POA: Diagnosis not present

## 2023-01-06 DIAGNOSIS — R2681 Unsteadiness on feet: Secondary | ICD-10-CM | POA: Diagnosis not present

## 2023-01-06 DIAGNOSIS — E119 Type 2 diabetes mellitus without complications: Secondary | ICD-10-CM | POA: Diagnosis not present

## 2023-01-12 DIAGNOSIS — Z961 Presence of intraocular lens: Secondary | ICD-10-CM | POA: Diagnosis not present

## 2023-01-12 DIAGNOSIS — F0390 Unspecified dementia without behavioral disturbance: Secondary | ICD-10-CM | POA: Diagnosis not present

## 2023-01-12 DIAGNOSIS — E119 Type 2 diabetes mellitus without complications: Secondary | ICD-10-CM | POA: Diagnosis not present

## 2023-01-12 DIAGNOSIS — H524 Presbyopia: Secondary | ICD-10-CM | POA: Diagnosis not present

## 2023-02-13 DIAGNOSIS — R0602 Shortness of breath: Secondary | ICD-10-CM | POA: Diagnosis not present

## 2023-03-03 DIAGNOSIS — L24A2 Irritant contact dermatitis due to fecal, urinary or dual incontinence: Secondary | ICD-10-CM | POA: Diagnosis not present

## 2023-03-03 DIAGNOSIS — L988 Other specified disorders of the skin and subcutaneous tissue: Secondary | ICD-10-CM | POA: Diagnosis not present

## 2023-03-10 DIAGNOSIS — L24A2 Irritant contact dermatitis due to fecal, urinary or dual incontinence: Secondary | ICD-10-CM | POA: Diagnosis not present

## 2023-03-11 DIAGNOSIS — R0989 Other specified symptoms and signs involving the circulatory and respiratory systems: Secondary | ICD-10-CM | POA: Diagnosis not present

## 2023-03-11 DIAGNOSIS — D649 Anemia, unspecified: Secondary | ICD-10-CM | POA: Diagnosis not present

## 2023-03-12 ENCOUNTER — Encounter (HOSPITAL_COMMUNITY): Payer: Self-pay | Admitting: Internal Medicine

## 2023-03-12 DIAGNOSIS — N390 Urinary tract infection, site not specified: Secondary | ICD-10-CM | POA: Diagnosis not present

## 2023-03-14 ENCOUNTER — Inpatient Hospital Stay (HOSPITAL_COMMUNITY)
Admission: EM | Admit: 2023-03-14 | Discharge: 2023-04-02 | DRG: 193 | Disposition: A | Discharge status: Skilled Nursing Facility | Payer: Medicare HMO | Source: Skilled Nursing Facility | Attending: Internal Medicine | Admitting: Internal Medicine

## 2023-03-14 ENCOUNTER — Encounter (HOSPITAL_COMMUNITY): Payer: Self-pay

## 2023-03-14 ENCOUNTER — Emergency Department (HOSPITAL_COMMUNITY): Payer: Medicare HMO

## 2023-03-14 ENCOUNTER — Other Ambulatory Visit: Payer: Self-pay

## 2023-03-14 DIAGNOSIS — B370 Candidal stomatitis: Secondary | ICD-10-CM | POA: Diagnosis present

## 2023-03-14 DIAGNOSIS — I517 Cardiomegaly: Secondary | ICD-10-CM | POA: Diagnosis not present

## 2023-03-14 DIAGNOSIS — I82432 Acute embolism and thrombosis of left popliteal vein: Secondary | ICD-10-CM | POA: Diagnosis present

## 2023-03-14 DIAGNOSIS — Z8249 Family history of ischemic heart disease and other diseases of the circulatory system: Secondary | ICD-10-CM

## 2023-03-14 DIAGNOSIS — I10 Essential (primary) hypertension: Secondary | ICD-10-CM | POA: Diagnosis present

## 2023-03-14 DIAGNOSIS — E875 Hyperkalemia: Secondary | ICD-10-CM | POA: Diagnosis present

## 2023-03-14 DIAGNOSIS — I5022 Chronic systolic (congestive) heart failure: Secondary | ICD-10-CM | POA: Diagnosis not present

## 2023-03-14 DIAGNOSIS — R4182 Altered mental status, unspecified: Secondary | ICD-10-CM | POA: Diagnosis not present

## 2023-03-14 DIAGNOSIS — E785 Hyperlipidemia, unspecified: Secondary | ICD-10-CM | POA: Diagnosis present

## 2023-03-14 DIAGNOSIS — Z66 Do not resuscitate: Secondary | ICD-10-CM | POA: Diagnosis present

## 2023-03-14 DIAGNOSIS — Z791 Long term (current) use of non-steroidal anti-inflammatories (NSAID): Secondary | ICD-10-CM

## 2023-03-14 DIAGNOSIS — E662 Morbid (severe) obesity with alveolar hypoventilation: Secondary | ICD-10-CM | POA: Diagnosis present

## 2023-03-14 DIAGNOSIS — R4189 Other symptoms and signs involving cognitive functions and awareness: Secondary | ICD-10-CM | POA: Diagnosis not present

## 2023-03-14 DIAGNOSIS — Z7901 Long term (current) use of anticoagulants: Secondary | ICD-10-CM

## 2023-03-14 DIAGNOSIS — K219 Gastro-esophageal reflux disease without esophagitis: Secondary | ICD-10-CM | POA: Diagnosis present

## 2023-03-14 DIAGNOSIS — L89322 Pressure ulcer of left buttock, stage 2: Secondary | ICD-10-CM | POA: Diagnosis present

## 2023-03-14 DIAGNOSIS — E08641 Diabetes mellitus due to underlying condition with hypoglycemia with coma: Secondary | ICD-10-CM | POA: Diagnosis not present

## 2023-03-14 DIAGNOSIS — L89302 Pressure ulcer of unspecified buttock, stage 2: Secondary | ICD-10-CM | POA: Diagnosis present

## 2023-03-14 DIAGNOSIS — R011 Cardiac murmur, unspecified: Secondary | ICD-10-CM | POA: Diagnosis present

## 2023-03-14 DIAGNOSIS — Z7984 Long term (current) use of oral hypoglycemic drugs: Secondary | ICD-10-CM

## 2023-03-14 DIAGNOSIS — R1310 Dysphagia, unspecified: Secondary | ICD-10-CM | POA: Diagnosis present

## 2023-03-14 DIAGNOSIS — Z6841 Body Mass Index (BMI) 40.0 and over, adult: Secondary | ICD-10-CM

## 2023-03-14 DIAGNOSIS — I5A Non-ischemic myocardial injury (non-traumatic): Secondary | ICD-10-CM | POA: Diagnosis present

## 2023-03-14 DIAGNOSIS — Y95 Nosocomial condition: Secondary | ICD-10-CM | POA: Diagnosis present

## 2023-03-14 DIAGNOSIS — I82409 Acute embolism and thrombosis of unspecified deep veins of unspecified lower extremity: Secondary | ICD-10-CM | POA: Diagnosis present

## 2023-03-14 DIAGNOSIS — Z86711 Personal history of pulmonary embolism: Secondary | ICD-10-CM | POA: Diagnosis not present

## 2023-03-14 DIAGNOSIS — J690 Pneumonitis due to inhalation of food and vomit: Secondary | ICD-10-CM | POA: Diagnosis present

## 2023-03-14 DIAGNOSIS — I5033 Acute on chronic diastolic (congestive) heart failure: Secondary | ICD-10-CM | POA: Diagnosis present

## 2023-03-14 DIAGNOSIS — G9341 Metabolic encephalopathy: Secondary | ICD-10-CM | POA: Diagnosis not present

## 2023-03-14 DIAGNOSIS — L89312 Pressure ulcer of right buttock, stage 2: Secondary | ICD-10-CM | POA: Diagnosis present

## 2023-03-14 DIAGNOSIS — E873 Alkalosis: Secondary | ICD-10-CM | POA: Diagnosis not present

## 2023-03-14 DIAGNOSIS — Z1152 Encounter for screening for COVID-19: Secondary | ICD-10-CM

## 2023-03-14 DIAGNOSIS — R0600 Dyspnea, unspecified: Secondary | ICD-10-CM | POA: Diagnosis not present

## 2023-03-14 DIAGNOSIS — Z794 Long term (current) use of insulin: Secondary | ICD-10-CM | POA: Diagnosis not present

## 2023-03-14 DIAGNOSIS — J189 Pneumonia, unspecified organism: Secondary | ICD-10-CM | POA: Diagnosis not present

## 2023-03-14 DIAGNOSIS — I2699 Other pulmonary embolism without acute cor pulmonale: Secondary | ICD-10-CM | POA: Diagnosis present

## 2023-03-14 DIAGNOSIS — E8809 Other disorders of plasma-protein metabolism, not elsewhere classified: Secondary | ICD-10-CM | POA: Diagnosis present

## 2023-03-14 DIAGNOSIS — Z7983 Long term (current) use of bisphosphonates: Secondary | ICD-10-CM

## 2023-03-14 DIAGNOSIS — Z885 Allergy status to narcotic agent status: Secondary | ICD-10-CM

## 2023-03-14 DIAGNOSIS — R918 Other nonspecific abnormal finding of lung field: Secondary | ICD-10-CM | POA: Diagnosis not present

## 2023-03-14 DIAGNOSIS — R0989 Other specified symptoms and signs involving the circulatory and respiratory systems: Secondary | ICD-10-CM | POA: Diagnosis not present

## 2023-03-14 DIAGNOSIS — J90 Pleural effusion, not elsewhere classified: Secondary | ICD-10-CM | POA: Diagnosis not present

## 2023-03-14 DIAGNOSIS — Z7982 Long term (current) use of aspirin: Secondary | ICD-10-CM

## 2023-03-14 DIAGNOSIS — N179 Acute kidney failure, unspecified: Secondary | ICD-10-CM | POA: Diagnosis present

## 2023-03-14 DIAGNOSIS — R001 Bradycardia, unspecified: Secondary | ICD-10-CM | POA: Diagnosis present

## 2023-03-14 DIAGNOSIS — I6782 Cerebral ischemia: Secondary | ICD-10-CM | POA: Diagnosis not present

## 2023-03-14 DIAGNOSIS — E1169 Type 2 diabetes mellitus with other specified complication: Secondary | ICD-10-CM | POA: Diagnosis not present

## 2023-03-14 DIAGNOSIS — Z860100 Personal history of colon polyps, unspecified: Secondary | ICD-10-CM

## 2023-03-14 DIAGNOSIS — J984 Other disorders of lung: Secondary | ICD-10-CM | POA: Diagnosis not present

## 2023-03-14 DIAGNOSIS — F0390 Unspecified dementia without behavioral disturbance: Secondary | ICD-10-CM | POA: Diagnosis present

## 2023-03-14 DIAGNOSIS — E11649 Type 2 diabetes mellitus with hypoglycemia without coma: Secondary | ICD-10-CM | POA: Diagnosis present

## 2023-03-14 DIAGNOSIS — Z8673 Personal history of transient ischemic attack (TIA), and cerebral infarction without residual deficits: Secondary | ICD-10-CM

## 2023-03-14 DIAGNOSIS — Z823 Family history of stroke: Secondary | ICD-10-CM

## 2023-03-14 DIAGNOSIS — I110 Hypertensive heart disease with heart failure: Secondary | ICD-10-CM | POA: Diagnosis present

## 2023-03-14 DIAGNOSIS — Z87891 Personal history of nicotine dependence: Secondary | ICD-10-CM

## 2023-03-14 DIAGNOSIS — E119 Type 2 diabetes mellitus without complications: Secondary | ICD-10-CM | POA: Diagnosis not present

## 2023-03-14 DIAGNOSIS — E1165 Type 2 diabetes mellitus with hyperglycemia: Secondary | ICD-10-CM | POA: Diagnosis not present

## 2023-03-14 DIAGNOSIS — R233 Spontaneous ecchymoses: Secondary | ICD-10-CM | POA: Diagnosis present

## 2023-03-14 DIAGNOSIS — J9621 Acute and chronic respiratory failure with hypoxia: Secondary | ICD-10-CM | POA: Diagnosis present

## 2023-03-14 DIAGNOSIS — E870 Hyperosmolality and hypernatremia: Secondary | ICD-10-CM | POA: Diagnosis present

## 2023-03-14 DIAGNOSIS — L8990 Pressure ulcer of unspecified site, unspecified stage: Secondary | ICD-10-CM | POA: Diagnosis present

## 2023-03-14 DIAGNOSIS — M7989 Other specified soft tissue disorders: Secondary | ICD-10-CM | POA: Diagnosis present

## 2023-03-14 DIAGNOSIS — Z880 Allergy status to penicillin: Secondary | ICD-10-CM

## 2023-03-14 DIAGNOSIS — I959 Hypotension, unspecified: Secondary | ICD-10-CM | POA: Diagnosis not present

## 2023-03-14 DIAGNOSIS — Z90710 Acquired absence of both cervix and uterus: Secondary | ICD-10-CM

## 2023-03-14 DIAGNOSIS — E161 Other hypoglycemia: Secondary | ICD-10-CM | POA: Diagnosis not present

## 2023-03-14 DIAGNOSIS — J188 Other pneumonia, unspecified organism: Secondary | ICD-10-CM | POA: Diagnosis not present

## 2023-03-14 DIAGNOSIS — R0602 Shortness of breath: Secondary | ICD-10-CM | POA: Diagnosis present

## 2023-03-14 DIAGNOSIS — R0902 Hypoxemia: Secondary | ICD-10-CM | POA: Diagnosis not present

## 2023-03-14 DIAGNOSIS — R410 Disorientation, unspecified: Secondary | ICD-10-CM | POA: Diagnosis not present

## 2023-03-14 DIAGNOSIS — M810 Age-related osteoporosis without current pathological fracture: Secondary | ICD-10-CM | POA: Diagnosis present

## 2023-03-14 DIAGNOSIS — Z79899 Other long term (current) drug therapy: Secondary | ICD-10-CM

## 2023-03-14 DIAGNOSIS — G4733 Obstructive sleep apnea (adult) (pediatric): Secondary | ICD-10-CM | POA: Diagnosis not present

## 2023-03-14 LAB — CBC WITH DIFFERENTIAL/PLATELET
Abs Immature Granulocytes: 0.12 10*3/uL — ABNORMAL HIGH (ref 0.00–0.07)
Basophils Absolute: 0.1 10*3/uL (ref 0.0–0.1)
Basophils Relative: 0 %
Eosinophils Absolute: 0 10*3/uL (ref 0.0–0.5)
Eosinophils Relative: 0 %
HCT: 42.7 % (ref 36.0–46.0)
Hemoglobin: 12.1 g/dL (ref 12.0–15.0)
Immature Granulocytes: 1 %
Lymphocytes Relative: 4 %
Lymphs Abs: 0.6 10*3/uL — ABNORMAL LOW (ref 0.7–4.0)
MCH: 25.6 pg — ABNORMAL LOW (ref 26.0–34.0)
MCHC: 28.3 g/dL — ABNORMAL LOW (ref 30.0–36.0)
MCV: 90.5 fL (ref 80.0–100.0)
Monocytes Absolute: 0.8 10*3/uL (ref 0.1–1.0)
Monocytes Relative: 6 %
Neutro Abs: 12.3 10*3/uL — ABNORMAL HIGH (ref 1.7–7.7)
Neutrophils Relative %: 89 %
Platelets: 220 10*3/uL (ref 150–400)
RBC: 4.72 MIL/uL (ref 3.87–5.11)
RDW: 17.3 % — ABNORMAL HIGH (ref 11.5–15.5)
WBC: 13.9 10*3/uL — ABNORMAL HIGH (ref 4.0–10.5)
nRBC: 0 % (ref 0.0–0.2)

## 2023-03-14 LAB — TROPONIN I (HIGH SENSITIVITY)
Troponin I (High Sensitivity): 312 ng/L (ref <18)
Troponin I (High Sensitivity): 241 ng/L (ref <18)

## 2023-03-14 LAB — COMPREHENSIVE METABOLIC PANEL
ALT: 15 U/L (ref 0–44)
AST: 14 U/L — ABNORMAL LOW (ref 15–41)
Albumin: 2.1 g/dL — ABNORMAL LOW (ref 3.5–5.0)
Alkaline Phosphatase: 64 U/L (ref 38–126)
Anion gap: 9 (ref 5–15)
BUN: 54 mg/dL — ABNORMAL HIGH (ref 8–23)
CO2: 30 mmol/L (ref 22–32)
Calcium: 7.8 mg/dL — ABNORMAL LOW (ref 8.9–10.3)
Chloride: 96 mmol/L — ABNORMAL LOW (ref 98–111)
Creatinine, Ser: 2.28 mg/dL — ABNORMAL HIGH (ref 0.44–1.00)
GFR, Estimated: 21 mL/min — ABNORMAL LOW (ref >60)
Glucose, Bld: 148 mg/dL — ABNORMAL HIGH (ref 70–99)
Potassium: 5.1 mmol/L (ref 3.5–5.1)
Sodium: 135 mmol/L (ref 135–145)
Total Bilirubin: 0.5 mg/dL (ref 0.3–1.2)
Total Protein: 6.5 g/dL (ref 6.5–8.1)

## 2023-03-14 LAB — MRSA NEXT GEN BY PCR, NASAL: MRSA by PCR Next Gen: NOT DETECTED

## 2023-03-14 LAB — URINALYSIS, W/ REFLEX TO CULTURE (INFECTION SUSPECTED)
Bilirubin Urine: NEGATIVE
Glucose, UA: >=500 mg/dL — AB
Hgb urine dipstick: NEGATIVE
Ketones, ur: NEGATIVE mg/dL
Leukocytes,Ua: NEGATIVE
Nitrite: NEGATIVE
Protein, ur: NEGATIVE mg/dL
Specific Gravity, Urine: 1.012 (ref 1.005–1.030)
pH: 5 (ref 5.0–8.0)

## 2023-03-14 LAB — I-STAT CG4 LACTIC ACID, ED
Lactic Acid, Venous: 1.5 mmol/L (ref 0.5–1.9)
Lactic Acid, Venous: 0.9 mmol/L (ref 0.5–1.9)

## 2023-03-14 LAB — BLOOD GAS, ARTERIAL
Acid-Base Excess: 5.1 mmol/L — ABNORMAL HIGH (ref 0.0–2.0)
Bicarbonate: 33.5 mmol/L — ABNORMAL HIGH (ref 20.0–28.0)
Drawn by: 20012
O2 Saturation: 96.8 %
Patient temperature: 36.4
pCO2 arterial: 66 mm[Hg] (ref 32–48)
pH, Arterial: 7.31 — ABNORMAL LOW (ref 7.35–7.45)
pO2, Arterial: 77 mm[Hg] — ABNORMAL LOW (ref 83–108)

## 2023-03-14 LAB — PROTIME-INR
INR: 1.1 (ref 0.8–1.2)
Prothrombin Time: 14.1 s (ref 11.4–15.2)

## 2023-03-14 LAB — BLOOD GAS, VENOUS
Acid-Base Excess: 3.6 mmol/L — ABNORMAL HIGH (ref 0.0–2.0)
Bicarbonate: 33.3 mmol/L — ABNORMAL HIGH (ref 20.0–28.0)
O2 Saturation: 43.1 %
Patient temperature: 37
pCO2, Ven: 76 mm[Hg] (ref 44–60)
pH, Ven: 7.25 (ref 7.25–7.43)
pO2, Ven: <31 mm[Hg] — CL (ref 32–45)

## 2023-03-14 LAB — RESP PANEL BY RT-PCR (RSV, FLU A&B, COVID)  RVPGX2
Influenza A by PCR: NEGATIVE
Influenza B by PCR: NEGATIVE
Resp Syncytial Virus by PCR: NEGATIVE
SARS Coronavirus 2 by RT PCR: NEGATIVE

## 2023-03-14 LAB — CBG MONITORING, ED: Glucose-Capillary: 128 mg/dL — ABNORMAL HIGH (ref 70–99)

## 2023-03-14 LAB — GLUCOSE, CAPILLARY
Glucose-Capillary: 57 mg/dL — ABNORMAL LOW (ref 70–99)
Glucose-Capillary: 86 mg/dL (ref 70–99)
Glucose-Capillary: 150 mg/dL — ABNORMAL HIGH (ref 70–99)

## 2023-03-14 LAB — BRAIN NATRIURETIC PEPTIDE: B Natriuretic Peptide: 997.4 pg/mL — ABNORMAL HIGH (ref 0.0–100.0)

## 2023-03-14 MED ORDER — ALBUTEROL SULFATE (2.5 MG/3ML) 0.083% IN NEBU
2.5000 mg | INHALATION_SOLUTION | Freq: Four times a day (QID) | RESPIRATORY_TRACT | Status: DC
  Administered 2023-03-14 – 2023-03-15 (×3): 2.5 mg via RESPIRATORY_TRACT
  Filled 2023-03-14 (×2): qty 3

## 2023-03-14 MED ORDER — DONEPEZIL HCL 10 MG PO TABS
10.0000 mg | ORAL_TABLET | Freq: Every day | ORAL | Status: DC
  Administered 2023-03-14 – 2023-04-01 (×18): 10 mg via ORAL
  Filled 2023-03-14 (×19): qty 1

## 2023-03-14 MED ORDER — ENOXAPARIN SODIUM 30 MG/0.3ML IJ SOSY
30.0000 mg | PREFILLED_SYRINGE | INTRAMUSCULAR | Status: DC
  Administered 2023-03-14: 30 mg via SUBCUTANEOUS
  Filled 2023-03-14: qty 0.3

## 2023-03-14 MED ORDER — ACETAMINOPHEN 650 MG RE SUPP: 650.0000 mg | Freq: Four times a day (QID) | RECTAL | Status: DC | PRN

## 2023-03-14 MED ORDER — VANCOMYCIN HCL IN DEXTROSE 1-5 GM/200ML-% IV SOLN
1000.0000 mg | Freq: Once | INTRAVENOUS | Status: AC
  Administered 2023-03-14: 1000 mg via INTRAVENOUS
  Filled 2023-03-14: qty 200

## 2023-03-14 MED ORDER — ACETAMINOPHEN 325 MG PO TABS
650.0000 mg | ORAL_TABLET | Freq: Four times a day (QID) | ORAL | Status: DC | PRN
  Administered 2023-03-16 – 2023-04-01 (×4): 650 mg via ORAL
  Filled 2023-03-14 (×4): qty 2

## 2023-03-14 MED ORDER — ENOXAPARIN SODIUM 40 MG/0.4ML IJ SOSY: 40.0000 mg | PREFILLED_SYRINGE | INTRAMUSCULAR | Status: DC

## 2023-03-14 MED ORDER — SODIUM CHLORIDE 0.9 % IV SOLN
2.0000 g | INTRAVENOUS | Status: DC
  Administered 2023-03-15: 2 g via INTRAVENOUS
  Filled 2023-03-14: qty 12.5

## 2023-03-14 MED ORDER — OXYCODONE HCL 5 MG PO TABS
5.0000 mg | ORAL_TABLET | ORAL | Status: DC | PRN
  Administered 2023-03-20: 5 mg via ORAL
  Filled 2023-03-14: qty 1

## 2023-03-14 MED ORDER — VANCOMYCIN VARIABLE DOSE PER UNSTABLE RENAL FUNCTION (PHARMACIST DOSING): Status: DC

## 2023-03-14 MED ORDER — ONDANSETRON HCL 4 MG/2ML IJ SOLN: 4.0000 mg | Freq: Four times a day (QID) | INTRAMUSCULAR | Status: DC | PRN

## 2023-03-14 MED ORDER — ONDANSETRON HCL 4 MG PO TABS: 4.0000 mg | ORAL_TABLET | Freq: Four times a day (QID) | ORAL | Status: DC | PRN

## 2023-03-14 MED ORDER — SODIUM CHLORIDE 0.9 % IV SOLN
2.0000 g | Freq: Once | INTRAVENOUS | Status: AC
  Administered 2023-03-14: 2 g via INTRAVENOUS
  Filled 2023-03-14: qty 12.5

## 2023-03-14 NOTE — ED Triage Notes (Signed)
Pt arrived by ambulance from [facility] nursing home. Called received for shob. On EMS arrival patient had CBG of 45- gave 2mg  of Glucagon. After IV established, patient received IV D10 25gm. CBG after was 163. EMS reports patient was recently diagnosed with Pleural effusion and put on O2 at home maintaining  94-95 percent on 2 L. Today patient dropped in 80's and facility staff called [phone]. Arrived on on 6L. Staff reports she is scheduled for a CT this week for f/u findings on chest X-ray. No other complaints at this time. Patient AAO4. No acute distress noted on arrival

## 2023-03-14 NOTE — ED Notes (Signed)
ED TO INPATIENT HANDOFF REPORT  ED Nurse Name and Phone #: Crist Infante, RN [phone]  S Name/Age/Gender Kathy Thomas 88 y.o. female Room/Bed: WA17/WA17  Code Status   Code Status: Prior  Home/SNF/Other Skilled nursing facility Patient oriented to: self, place, time, and situation Is this baseline? Yes   Triage Complete: Triage complete  Chief Complaint Community acquired pneumonia [J18.9]  Triage Note Pt arrived by ambulance from [facility] nursing home. Called received for shob. On EMS arrival patient had CBG of 45- gave 2mg  of Glucagon. After IV established, patient received IV D10 25gm. CBG after was 163. EMS reports patient was recently diagnosed with Pleural effusion and put on O2 at home maintaining  94-95 percent on 2 L. Today patient dropped in 80's and facility staff called [phone]. Arrived on on 6L. Staff reports she is scheduled for a CT this week for f/u findings on chest X-ray. No other complaints at this time. Patient AAO4. No acute distress noted on arrival   Allergies Allergies  Allergen Reactions   Codeine Hives   Penicillins     REACTION: rash   Morphine Rash    REACTION: pt unsure of reaction    Level of Care/Admitting Diagnosis ED Disposition     ED Disposition  Admit   Condition  --   Comment  Hospital Area: [facility] [[id number]]  Level of Care: Progressive [102]  Admit to Progressive based on following criteria: RESPIRATORY PROBLEMS hypoxemic/hypercapnic respiratory failure that is responsive to NIPPV (BiPAP) or High Flow Nasal Cannula (6-80 lpm). Frequent assessment/intervention, no > Q2 hrs < Q4 hrs, to maintain oxygenation and pulmonary hygiene.  May admit patient to [facility] or [facility] if equivalent level of care is available:: Yes  Covid Evaluation: Asymptomatic - no recent exposure (last 10 days) testing not required  Diagnosis: Community acquired pneumonia [027253]  Admitting Physician: Alan Mulder [[id number]]  Attending  Physician: Alan Mulder [[id number]]  Certification:: I certify this patient will need inpatient services for at least 2 midnights  Expected Medical Readiness: 03/18/2023          B Medical/Surgery History Past Medical History:  Diagnosis Date   Acute sinusitis, unspecified 05/28/2008   ANEMIA-IRON DEFICIENCY 10/26/2008   ANXIETY 10/06/2007   CHOLELITHIASIS 10/06/2007   COLONIC POLYPS, HX OF 01/26/2007   DIABETES MELLITUS, TYPE II 10/06/2007   DIVERTICULOSIS, COLON 01/26/2007   ECCHYMOSES, SPONTANEOUS 06/04/2010   GERD 01/26/2007   Heart murmur    slight heart murmur   HERNIATED DISC 01/26/2007   HYPERLIPIDEMIA 10/06/2007   HYPERTENSION 01/26/2007   LOW BACK PAIN 01/29/2007   MENOPAUSAL DISORDER 10/26/2008   OSTEOPOROSIS 06/04/2010   SHOULDER PAIN, LEFT 10/06/2007   Stroke (HCC)    TIA's   Past Surgical History:  Procedure Laterality Date   ABDOMINAL HYSTERECTOMY     APPENDECTOMY     Fracture left  wrist  2010   Dr. Amanda Pea   Herniated disk   1995   C-spine   Left ankle-screw placement  1980   Left elbow-nerve impingement  1985   TONSILLECTOMY       A IV Location/Drains/Wounds Patient Lines/Drains/Airways Status     Active Line/Drains/Airways     Name Placement date Placement time Site Days   Peripheral IV 03/14/23 20 G Right Antecubital 03/14/23  1406  Antecubital  less than 1            Intake/Output Last 24 hours No intake or output data in the 24 hours ending  03/14/23 1659  Labs/Imaging Results for orders placed or performed during the hospital encounter of 03/14/23 (from the past 48 hour(s))  CBG monitoring, ED     Status: Abnormal   Collection Time: 03/14/23  1:49 PM  Result Value Ref Range   Glucose-Capillary 128 (H) 70 - 99 mg/dL    Comment: Glucose reference range applies only to samples taken after fasting for at least 8 hours.  CBC with Differential     Status: Abnormal   Collection Time: 03/14/23  1:50 PM  Result Value Ref Range   WBC 13.9 (H) 4.0 - 10.5  K/uL   RBC 4.72 3.87 - 5.11 MIL/uL   Hemoglobin 12.1 12.0 - 15.0 g/dL   HCT 16.1 09.6 - 04.5 %   MCV 90.5 80.0 - 100.0 fL   MCH 25.6 (L) 26.0 - 34.0 pg   MCHC 28.3 (L) 30.0 - 36.0 g/dL   RDW 40.9 (H) 81.1 - 91.4 %   Platelets 220 150 - 400 K/uL   nRBC 0.0 0.0 - 0.2 %   Neutrophils Relative % 89 %   Neutro Abs 12.3 (H) 1.7 - 7.7 K/uL   Lymphocytes Relative 4 %   Lymphs Abs 0.6 (L) 0.7 - 4.0 K/uL   Monocytes Relative 6 %   Monocytes Absolute 0.8 0.1 - 1.0 K/uL   Eosinophils Relative 0 %   Eosinophils Absolute 0.0 0.0 - 0.5 K/uL   Basophils Relative 0 %   Basophils Absolute 0.1 0.0 - 0.1 K/uL   Immature Granulocytes 1 %   Abs Immature Granulocytes 0.12 (H) 0.00 - 0.07 K/uL    Comment: Performed at [facility], [address] [place], [place] [address]  Protime-INR     Status: None   Collection Time: 03/14/23  1:50 PM  Result Value Ref Range   Prothrombin Time 14.1 11.4 - 15.2 seconds   INR 1.1 0.8 - 1.2    Comment: (NOTE) INR goal varies based on device and disease states. Performed at [facility], [address], [place], [place] [address]   Brain natriuretic peptide     Status: Abnormal   Collection Time: 03/14/23  1:50 PM  Result Value Ref Range   B Natriuretic Peptide 997.4 (H) 0.0 - 100.0 pg/mL    Comment: Performed at [facility], [address], [place], [place] [address]  Blood gas, venous     Status: Abnormal   Collection Time: 03/14/23  1:51 PM  Result Value Ref Range   pH, Ven 7.25 7.25 - 7.43   pCO2, Ven 76 (HH) 44 - 60 mmHg    Comment: CRITICAL RESULT CALLED TO, READ BACK BY AND VERIFIED WITH: HOPKINS, D RN AT 1443 ON 03/14/2023 BY XIONG, K    pO2, Ven <31 (LL) 32 - 45 mmHg    Comment: CRITICAL RESULT CALLED TO, READ BACK BY AND VERIFIED WITH: HOPKINS, D RN AT 1443 ON 02/2023 BY Deedra Ehrich, K    Bicarbonate 33.3 (H) 20.0 - 28.0 mmol/L   Acid-Base Excess 3.6 (H) 0.0 - 2.0 mmol/L   O2 Saturation 43.1 %    Patient temperature 37.0     Comment: Performed at [facility], [address], [place], [place] [address]  Resp panel by RT-PCR (RSV, Flu A&B, Covid) Anterior Nasal Swab     Status: None   Collection Time: 03/14/23  1:51 PM   Specimen: Anterior Nasal Swab  Result Value Ref Range   SARS Coronavirus 2  by RT PCR NEGATIVE NEGATIVE    Comment: (NOTE) SARS-CoV-2 target nucleic acids are NOT DETECTED.  The SARS-CoV-2 RNA is generally detectable in upper respiratory specimens during the acute phase of infection. The lowest concentration of SARS-CoV-2 viral copies this assay can detect is 138 copies/mL. A negative result does not preclude SARS-Cov-2 infection and should not be used as the sole basis for treatment or other patient management decisions. A negative result may occur with  improper specimen collection/handling, submission of specimen other than nasopharyngeal swab, presence of viral mutation(s) within the areas targeted by this assay, and inadequate number of viral copies(<138 copies/mL). A negative result must be combined with clinical observations, patient history, and epidemiological information. The expected result is Negative.  Fact Sheet for Patients:  BloggerCourse.com  Fact Sheet for Healthcare Providers:  SeriousBroker.it  This test is no t yet approved or cleared by the [place] FDA and  has been authorized for detection and/or diagnosis of SARS-CoV-2 by FDA under an Emergency Use Authorization (EUA). This EUA will remain  in effect (meaning this test can be used) for the duration of the COVID-19 declaration under Section 564(b)(1) of the Act, 21 U.S.C.section 360bbb-3(b)(1), unless the authorization is terminated  or revoked sooner.       Influenza A by PCR NEGATIVE NEGATIVE   Influenza B by PCR NEGATIVE NEGATIVE    Comment: (NOTE) The Xpert Xpress SARS-CoV-2/FLU/RSV plus assay is  intended as an aid in the diagnosis of influenza from Nasopharyngeal swab specimens and should not be used as a sole basis for treatment. Nasal washings and aspirates are unacceptable for Xpert Xpress SARS-CoV-2/FLU/RSV testing.  Fact Sheet for Patients: BloggerCourse.com  Fact Sheet for Healthcare Providers: SeriousBroker.it  This test is not yet approved or cleared by the [place] FDA and has been authorized for detection and/or diagnosis of SARS-CoV-2 by FDA under an Emergency Use Authorization (EUA). This EUA will remain in effect (meaning this test can be used) for the duration of the COVID-19 declaration under Section 564(b)(1) of the Act, 21 U.S.C. section 360bbb-3(b)(1), unless the authorization is terminated or revoked.     Resp Syncytial Virus by PCR NEGATIVE NEGATIVE    Comment: (NOTE) Fact Sheet for Patients: BloggerCourse.com  Fact Sheet for Healthcare Providers: SeriousBroker.it  This test is not yet approved or cleared by the [place] FDA and has been authorized for detection and/or diagnosis of SARS-CoV-2 by FDA under an Emergency Use Authorization (EUA). This EUA will remain in effect (meaning this test can be used) for the duration of the COVID-19 declaration under Section 564(b)(1) of the Act, 21 U.S.C. section 360bbb-3(b)(1), unless the authorization is terminated or revoked.  Performed at [facility], [address], [place], [place] [address]   I-Stat Lactic Acid     Status: None   Collection Time: 03/14/23  2:24 PM  Result Value Ref Range   Lactic Acid, Venous 1.5 0.5 - 1.9 mmol/L  Comprehensive metabolic panel     Status: Abnormal   Collection Time: 03/14/23  2:57 PM  Result Value Ref Range   Sodium 135 135 - 145 mmol/L   Potassium 5.1 3.5 - 5.1 mmol/L   Chloride 96 (L) 98 - 111 mmol/L   CO2 30 22 - 32 mmol/L    Glucose, Bld 148 (H) 70 - 99 mg/dL    Comment: Glucose reference range applies only to samples taken after fasting for at least 8 hours.   BUN 54 (H) 8 - 23  mg/dL   Creatinine, Ser 1.61 (H) 0.44 - 1.00 mg/dL   Calcium 7.8 (L) 8.9 - 10.3 mg/dL   Total Protein 6.5 6.5 - 8.1 g/dL   Albumin 2.1 (L) 3.5 - 5.0 g/dL   AST 14 (L) 15 - 41 U/L   ALT 15 0 - 44 U/L   Alkaline Phosphatase 64 38 - 126 U/L   Total Bilirubin 0.5 0.3 - 1.2 mg/dL   GFR, Estimated 21 (L) >60 mL/min    Comment: (NOTE) Calculated using the CKD-EPI Creatinine Equation (2021)    Anion gap 9 5 - 15    Comment: Performed at [facility], [address], [place], [place] [address]  Troponin I (High Sensitivity)     Status: Abnormal   Collection Time: 03/14/23  2:57 PM  Result Value Ref Range   Troponin I (High Sensitivity) 312 (HH) <18 ng/L    Comment: CRITICAL RESULT CALLED TO, READ BACK BY AND VERIFIED WITH Saahas Hidrogo,T. RN AT 1614 03/14/23 MULLINS,T (NOTE) Elevated high sensitivity troponin I (hsTnI) values and significant  changes across serial measurements may suggest ACS but many other  chronic and acute conditions are known to elevate hsTnI results.  Refer to the "Links" section for chest pain algorithms and additional  guidance. Performed at [facility], [address] [place], [place] [address]   Blood gas, arterial     Status: Abnormal   Collection Time: 03/14/23  2:57 PM  Result Value Ref Range   FIO2 NASAL CANNULA %   pH, Arterial 7.31 (L) 7.35 - 7.45   pCO2 arterial 66 (HH) 32 - 48 mmHg    Comment: CRITICAL RESULT CALLED TO, READ BACK BY AND VERIFIED WITH: Dale Okolona RN AT 1533 ON 03/14/2023 BY XIONG, K    pO2, Arterial 77 (L) 83 - 108 mmHg   Bicarbonate 33.5 (H) 20.0 - 28.0 mmol/L   Acid-Base Excess 5.1 (H) 0.0 - 2.0 mmol/L   O2 Saturation 96.8 %   Patient temperature 36.4    Collection site RIGHT RADIAL    Drawn by [id number]    Allens test (pass/fail) PASS PASS     Comment: Performed at [facility], [address] [place], [place] [address]  I-Stat Lactic Acid     Status: None   Collection Time: 03/14/23  4:33 PM  Result Value Ref Range   Lactic Acid, Venous 0.9 0.5 - 1.9 mmol/L   DG Chest Port 1 View  Result Date: 03/14/2023 CLINICAL DATA:  Shortness of breath EXAM: PORTABLE CHEST 1 VIEW COMPARISON:  01/31/2020 FINDINGS: Cardiomegaly. Aortic atherosclerosis. Low lung volumes. Prominent interstitial markings bilaterally with dense airspace consolidation in the periphery of the right mid lung. Small right and probable trace left pleural effusions. No pneumothorax. IMPRESSION: 1. Dense airspace consolidation in the periphery of the right mid lung suspicious for pneumonia. Radiographic follow-up to resolution is recommended. 2. Cardiomegaly with prominent interstitial markings bilaterally, which may be related to atypical/viral infection or superimposed edema. 3. Small right and probable trace left pleural effusions. Electronically Signed   By: Duanne Guess D.O.   On: 03/14/2023 15:41    Pending Labs Unresulted Labs (From admission, onward)     Start     Ordered   03/14/23 1351  Urinalysis, w/ Reflex to Culture (Infection Suspected) -Urine, Catheterized  Once,   URGENT       Question:  Specimen Source  Answer:  Urine, Catheterized   03/14/23 1351   03/14/23 1351  Culture, blood (routine x 2)  BLOOD CULTURE X 2,   R (with STAT occurrences)      03/14/23 1351            Vitals/Pain Today's Vitals   03/14/23 1407 03/14/23 1410 03/14/23 1500 03/14/23 1619  BP:  127/62 119/61 (!) 125/54  Pulse: (!) 51 (!) 52 (!) 49 (!) 50  Resp: (!) 21 (!) 21 12 17   Temp:  97.6 F (36.4 C)  98 F (36.7 C)  TempSrc:  Oral  Oral  SpO2: 100% 100% 100% 100%    Isolation Precautions No active isolations  Medications Medications  vancomycin (VANCOCIN) IVPB 1000 mg/200 mL premix (has no administration in time range)  ceFEPIme (MAXIPIME)  2 g in sodium chloride 0.9 % 100 mL IVPB (has no administration in time range)    Mobility walks with device     Focused Assessments Neuro Assessment Handoff:  Swallow screen pass?  N/A         Neuro Assessment:   Neuro Checks:      Has TPA been given? No If patient is a Neuro Trauma and patient is going to OR before floor call report to 4N Charge nurse: [phone] or [phone]   R Recommendations: See Admitting Provider Note  Report given to:   Additional Notes:

## 2023-03-14 NOTE — Progress Notes (Signed)
Pharmacy Antibiotic Note  Kathy Thomas is a 88 y.o. female admitted on 03/14/2023 with HCAP.  Pharmacy has been consulted for vancomycin and cefepime dosing.  Plan: Cefepime 2 gr IV q24h  Vancomycin 2000 mg IV ( 1000 mg gived in ED, additional 1000 mg ordered for a loading dose of 2000 mg)  x1, then  further doses to be per levels as pt in AKI.  As SCr improves, will adjust to standing orders  Monitor clinical course, renal function, cultures as available   Height: 5\' 1"  (154.9 cm) Weight: 116.6 kg (257 lb 0.9 oz) IBW/kg (Calculated) : 47.8  Temp (24hrs), Avg:98.2 F (36.8 C), Min:97.6 F (36.4 C), Max:98.9 F (37.2 C)  Recent Labs  Lab 03/14/23 1350 03/14/23 1424 03/14/23 1457 03/14/23 1633  WBC 13.9*  --   --   --   CREATININE  --   --  2.28*  --   LATICACIDVEN  --  1.5  --  0.9    Estimated Creatinine Clearance: 21.4 mL/min (A) (by C-G formula based on SCr of 2.28 mg/dL (H)).    Allergies  Allergen Reactions   Codeine Hives   Penicillins     REACTION: rash   Morphine Rash    REACTION: pt unsure of reaction    Antimicrobials this admission: 10/20 cefepime >>  10/20 vancomycin >>   Dose adjustments this admission:   Microbiology results: 10/20 BCx:  10/20 MRSA PCR:     Adalberto Cole, PharmD, BCPS 03/14/2023 7:55 PM

## 2023-03-14 NOTE — Progress Notes (Signed)
Pharmacy Note   A consult was received from an ED physician for cevefpime and vancomycin per pharmacy dosing.    The patient's profile has been reviewed for ht/wt/allergies/indication/available labs.    A one time order has been placed for cefepime 2 gr IV x1 and vancomycin 1000 mg IV x1 .  Of note, no recent weight in system so will give the initial 1000 mg IV x1 vancomycin dose     Further antibiotics/pharmacy consults should be ordered by admitting physician if indicated.                       Thank you,  Adalberto Cole, PharmD, BCPS 03/14/2023 4:52 PM

## 2023-03-14 NOTE — ED Notes (Signed)
Critical labs    blood gas  76    po2 less than 31   gave results to doctor Messick  need another light green drawn

## 2023-03-14 NOTE — H&P (Signed)
History and Physical    Kathy Thomas:[id number] DOB: [birth year] DOA: 03/14/2023  PCP: Marden Noble, MD   Chief Complaint: sob  HPI: Kathy Thomas is a 88 y.o. female with medical history significant of dementia, hypertension who presented to the emergency department due to shortness of breath.  Patient was found to be hypoxic 3 days ago and underwent chest x-ray which showed concern for community-acquired pneumonia.  She was started on ertapenem and received 3 doses.  By the nursing facility she developed tachypnea and shortness of breath so EMS was called to bring her to the emergency department further assessment.  On EMS arrival she was found to be hypoglycemic.  She was brought to the ER where she was hypoxic requiring up to 6 L nasal cannula.  She was otherwise afebrile and hemodynamically stable.  Labs were obtained on presentation which revealed WBC 13.9, hemoglobin 12.1, platelets 220, INR 1.1, BNP 997, venous blood gas showed pCO2 76, pH 7.25.  Respiratory viral panel was obtained which was negative.  Lactic acid showed 1.5, troponin 319, creatinine 2.2 baseline around 1.  Repeat troponin showed downtrend to 241.  Urinalysis demonstrated no evidence of infection.  Due to patient's failure of ertapenem patient was given vancomycin and cefepime and treated for hospital-acquired pneumonia.  On evaluation she was breathing comfortably on 6 L.  She endorsed cough but denied any shortness of breath or weight gain.  No fevers or chills.   Review of Systems: Review of Systems  Constitutional: Negative.  Negative for fever.  Eyes: Negative.   Respiratory:  Positive for cough and shortness of breath.   Cardiovascular:  Negative for chest pain.  Gastrointestinal: Negative.   Genitourinary: Negative.   Musculoskeletal: Negative.   Skin: Negative.   Neurological: Negative.   Endo/Heme/Allergies: Negative.   Psychiatric/Behavioral: Negative.    All other systems reviewed and are  negative.    As per HPI otherwise 10 point review of systems negative.   Allergies  Allergen Reactions   Codeine Hives   Penicillins     REACTION: rash   Morphine Rash    REACTION: pt unsure of reaction    Past Medical History:  Diagnosis Date   Acute sinusitis, unspecified 05/28/2008   ANEMIA-IRON DEFICIENCY 10/26/2008   ANXIETY 10/06/2007   CHOLELITHIASIS 10/06/2007   COLONIC POLYPS, HX OF 01/26/2007   DIABETES MELLITUS, TYPE II 10/06/2007   DIVERTICULOSIS, COLON 01/26/2007   ECCHYMOSES, SPONTANEOUS 06/04/2010   GERD 01/26/2007   Heart murmur    slight heart murmur   HERNIATED DISC 01/26/2007   HYPERLIPIDEMIA 10/06/2007   HYPERTENSION 01/26/2007   LOW BACK PAIN 01/29/2007   MENOPAUSAL DISORDER 10/26/2008   OSTEOPOROSIS 06/04/2010   SHOULDER PAIN, LEFT 10/06/2007   Stroke (HCC)    TIA's    Past Surgical History:  Procedure Laterality Date   ABDOMINAL HYSTERECTOMY     APPENDECTOMY     Fracture left  wrist  2010   Dr. Amanda Pea   Herniated disk   1995   C-spine   Left ankle-screw placement  1980   Left elbow-nerve impingement  1985   TONSILLECTOMY       reports that she quit smoking about 9 years ago. Her smoking use included cigarettes. She has never used smokeless tobacco. She reports that she does not drink alcohol and does not use drugs.  Family History  Problem Relation Age of Onset   Heart disease Father    Hyperlipidemia Other    Stroke Other  Hypertension Other    Esophageal cancer Neg Hx    Colon polyps Neg Hx    Rectal cancer Neg Hx    Stomach cancer Neg Hx     Prior to Admission medications   Medication Sig Start Date End Date Taking? Authorizing Provider  acetaminophen (TYLENOL) 500 MG tablet Take 1,000 mg by mouth every 6 (six) hours as needed for mild pain or headache.   Yes [provider]  ASPIRIN 81 PO Take 1 tablet by mouth daily.   Yes [provider]  Cholecalciferol (VITAMIN D3 PO) Take 1 capsule by mouth daily.   Yes [provider]  coal tar (NEUTROGENA T-GEL) 0.5 % shampoo Apply 1 Application topically as needed. Monday and thursdays   Yes [provider]  donepezil (ARICEPT) 10 MG tablet TAKE ONE TABLET BY MOUTH EVERYDAY AT BEDTIME 03/17/21  Yes [name], MD  glimepiride (AMARYL) 2 MG tablet Take 2 mg by mouth daily.   Yes [provider]  JARDIANCE 25 MG TABS tablet Take 25 mg by mouth daily.   Yes [provider]  ketoconazole (NIZORAL) 200 MG tablet Take 200 mg by mouth daily.   Yes [provider]  losartan (COZAAR) 50 MG tablet Take 1 tablet (50 mg total) by mouth daily. 04/04/21  Yes [name], MD  meloxicam (MOBIC) 7.5 MG tablet TAKE ONE TABLET BY MOUTH AT NOON 05/21/21  Yes [name], MD  NOVOLOG FLEXPEN 100 UNIT/ML FlexPen Inject 0-12 Units into the skin in the morning and at bedtime. Sliding 0-200 =0 units 201-250=4units 251-300=6units 301-350=8units 351-400=10 units 401-550=12 units 10/09/22  Yes [provider]  psyllium (REGULOID) 0.52 g capsule Take 0.52 g by mouth daily.   Yes [provider]  vitamin B-12 1000 MCG tablet Take 1 tablet (1,000 mcg total) by mouth daily. 07/08/14  Yes Conley Canal, MD  alendronate (FOSAMAX) 70 MG tablet TAKE ONE TABLET BY MOUTH ONCE WEEKLY BEFORE BREAKFAST ON MONDAY 01/21/21   [name], MD  furosemide (LASIX) 40 MG tablet TAKE ONE TABLET BY MOUTH daily 06/06/21   [name], MD    Physical Exam: Vitals:   03/14/23 1407 03/14/23 1410 03/14/23 1500 03/14/23 1619  BP:  127/62 119/61 (!) 125/54  Pulse: (!) 51 (!) 52 (!) 49 (!) 50  Resp: (!) 21 (!) 21 12 17   Temp:  97.6 F (36.4 C)  98 F (36.7 C)  TempSrc:  Oral  Oral  SpO2: 100% 100% 100% 100%   Physical Exam Constitutional:      Appearance: She is normal weight.  Neurological:     Mental Status: She is alert.       Labs on Admission: I have personally reviewed the patients's labs and imaging  studies.  Assessment/Plan  # Hospital-acquired pneumonia - Patient had infiltrates on chest x-ray in setting of worsening shortness of breath - Completed 3 days of ertapenem without improvement -Requiring up to 6 L nasal cannula  Plan: Broadened to vancomycin and cefepime Order CT chest Scheduled albuterol Wean oxygen as able Hold diuresis for now given kidney function  # AKI-baseline creatinine around one 2.2 on presentation.  Will treat for pneumonia and trend creatinine  # Hypertension-hold antihypertensives in setting of pneumonia  # Cognitive impairment-continue donepezil  #Hypoglycemia-placed on regular diet. Hold insulin, antiglycemics    Admission status: Inpatient Progressive  Certification: The appropriate patient status for this patient is INPATIENT. Inpatient status is judged to be reasonable  and necessary in order to provide the required intensity of service to ensure the patient's safety. The patient's presenting symptoms, physical exam findings, and initial radiographic and laboratory data in the context of their chronic comorbidities is felt to place them at high risk for further clinical deterioration. Furthermore, it is not anticipated that the patient will be medically stable for discharge from the hospital within 2 midnights of admission.   * I certify that at the point of admission it is my clinical judgment that the patient will require inpatient hospital care spanning beyond 2 midnights from the point of admission due to high intensity of service, high risk for further deterioration and high frequency of surveillance required.Alan Mulder MD [facility] If 7PM-7AM, please contact night-coverage [email or web address]  03/14/2023, 5:45 PM

## 2023-03-14 NOTE — ED Notes (Signed)
Patient transported to CT 

## 2023-03-14 NOTE — ED Provider Notes (Signed)
EMERGENCY DEPARTMENT AT [facility] Provider Note   CSN: [id number] Arrival date & time: 03/14/23  1326     History  Chief Complaint  Patient presents with   Hypoglycemia   Shortness of Breath    Kathy Thomas is a 88 y.o. female.  88 year old female with prior medical history as detailed below presents for evaluation with EMS transport from [facility].  EMS reports that patient was hypoglycemic with initial evaluation today.  Initial CBG was 45.  Patient was given 2 mg of glucagon.  IV was established.  Patient was then given 25 g of D10 IV.  CBG improved to 163.  Per records from [facility], patient noted to have hypoxia on room air on October 17.  Chest x-ray suggested pleural effusion versus infection versus mass.  Patient was placed on supplemental O2 and given Invanz (starting on the 17th - 3 doses total).  Patient appears to be comfortable on my initial evaluation.  She denies pain.  She denies significant shortness of breath.  She is on supplemental O2 at 5 to 6 L.  The history is provided by the patient and medical records.       Home Medications Prior to Admission medications   Medication Sig Start Date End Date Taking? Authorizing Provider  acetaminophen (TYLENOL) 500 MG tablet Take 1,000 mg by mouth every 6 (six) hours as needed for mild pain or headache.    [provider]  alendronate (FOSAMAX) 70 MG tablet TAKE ONE TABLET BY MOUTH ONCE WEEKLY BEFORE BREAKFAST ON MONDAY 01/21/21   [name], MD  Ascorbic Acid (VITAMIN C) 500 MG CAPS Take by mouth.    [provider]  ASPIRIN 81 PO Take by mouth daily.    [provider]  atorvastatin (LIPITOR) 40 MG tablet TAKE ONE TABLET BY MOUTH EVERYDAY AT BEDTIME 03/17/21   [name], MD  Cholecalciferol (VITAMIN D3 PO) Take 1 capsule by mouth daily.    [provider]  dicyclomine (BENTYL) 20 MG tablet TAKE ONE TABLET BY MOUTH EVERY MORNING and TAKE ONE  TABLET BY MOUTH EVERYDAY AT BEDTIME 06/06/21   [name], MD  donepezil (ARICEPT) 10 MG tablet TAKE ONE TABLET BY MOUTH EVERYDAY AT BEDTIME 03/17/21   [name], MD  FIBER PO Take 1 tablet by mouth daily.    [provider]  furosemide (LASIX) 40 MG tablet TAKE ONE TABLET BY MOUTH daily 06/06/21   [name], MD  losartan (COZAAR) 50 MG tablet Take 1 tablet (50 mg total) by mouth daily. 04/04/21   [name], MD  meloxicam (MOBIC) 7.5 MG tablet TAKE ONE TABLET BY MOUTH AT NOON 05/21/21   [name], MD  Multiple Vitamins-Minerals (ZINC PO) Take 1 tablet by mouth daily.    [provider]  nystatin (MYCOSTATIN/NYSTOP) powder Use as directed twice daily as needed to affected area 05/16/21   [name], MD  pioglitazone (ACTOS) 30 MG tablet TAKE ONE TABLET BY MOUTH EVERY MORNING 03/17/21   [name], MD  Probiotic Product (PROBIOTIC PO) Take 1 capsule by mouth daily.    [provider]  vitamin B-12 1000 MCG tablet Take 1 tablet (1,000 mcg total) by mouth daily. 07/08/14   Conley Canal, MD      Allergies    Codeine, Penicillins, and Morphine    Review of Systems   Review of Systems  All other systems reviewed and are  negative.   Physical Exam Updated Vital Signs There were no vitals taken for this visit. Physical Exam Vitals and nursing note reviewed.  Constitutional:      General: She is not in acute distress.    Appearance: Normal appearance. She is well-developed.  HENT:     Head: Normocephalic and atraumatic.  Eyes:     Conjunctiva/sclera: Conjunctivae normal.     Pupils: Pupils are equal, round, and reactive to light.  Cardiovascular:     Rate and Rhythm: Normal rate and regular rhythm.     Heart sounds: Normal heart sounds.  Pulmonary:     Effort: Pulmonary effort is normal. No respiratory distress.     Comments: Decreased breath sounds at bases bilaterally Abdominal:     General: There is no distension.     Palpations:  Abdomen is soft.     Tenderness: There is no abdominal tenderness.  Musculoskeletal:        General: No deformity. Normal range of motion.     Cervical back: Normal range of motion and neck supple.  Skin:    General: Skin is warm and dry.  Neurological:     General: No focal deficit present.     Mental Status: She is alert and oriented to person, place, and time.     ED Results / Procedures / Treatments   Labs (all labs ordered are listed, but only abnormal results are displayed) Labs Reviewed  CBG MONITORING, ED - Abnormal; Notable for the following components:      Result Value   Glucose-Capillary 128 (*)    All other components within normal limits  RESP PANEL BY RT-PCR (RSV, FLU A&B, COVID)  RVPGX2  CULTURE, BLOOD (ROUTINE X 2)  CULTURE, BLOOD (ROUTINE X 2)  CBC WITH DIFFERENTIAL/PLATELET  PROTIME-INR  BRAIN NATRIURETIC PEPTIDE  COMPREHENSIVE METABOLIC PANEL  BLOOD GAS, VENOUS  URINALYSIS, W/ REFLEX TO CULTURE (INFECTION SUSPECTED)  I-STAT CG4 LACTIC ACID, ED  TROPONIN I (HIGH SENSITIVITY)    EKG None  Radiology No results found.  Procedures Procedures    Medications Ordered in ED Medications - No data to display  ED Course/ Medical Decision Making/ A&P                                 Medical Decision Making Amount and/or Complexity of Data Reviewed Labs: ordered. Radiology: ordered.  Risk Decision regarding hospitalization.    Medical Screen Complete  This patient presented to the ED with complaint of hyperglycemia, hypoxia, suspected pneumonia.  This complaint involves an extensive number of treatment options. The initial differential diagnosis includes, but is not limited to, infection, metabolic abnormality, AKI, etc.  This presentation is: Acute, Chronic, Self-Limited, Previously Undiagnosed, Uncertain Prognosis, Complicated, Systemic Symptoms, and Threat to Life/Bodily Function  Patient with known history of dementia, hypertension,  hyperlipidemia, diabetes, TIA with recently diagnosed suspected pneumonia presents for evaluation.  Patient had transient hypoglycemia earlier today and was thus sent to the ED for evaluation.  Glucose is improved with EMS treatment.  Patient with increased O2 requirement x 2 to 3 days.  Patient was diagnosed at her nursing facility with likely pneumonia and started on Invanz x 3 days.  Workup obtained today reveal likely right lower lobe infiltrate concerning for persistent pneumonia.  Patient with continued oxygen requirement.  Patient also with mild hypercarbia on ABG.  Patient with AKI with creatinine 2.2 above baseline of 1.  Initial  troponin is 312 with an elevated BNP at 997.  Patient would benefit from admission.  Antibiotics broadened to vancomycin and cefepime.  Hospitalist service made aware of case will evaluate for admission.  Additional history obtained:  Additional history obtained from EMS External records from outside sources obtained and reviewed including prior ED visits and prior Inpatient records.    Cardiac Monitoring:  The patient was maintained on a cardiac monitor.  I personally viewed and interpreted the cardiac monitor which showed an underlying rhythm of: NSR   Medicines ordered:  I ordered medication including Abx  for suspected penumonia  Reevaluation of the patient after these medicines showed that the patient: improved  Problem List / ED Course:  Transient hypoglycemia, AKI, hypercarbia, suspected pneumonia   Reevaluation:  After the interventions noted above, I reevaluated the patient and found that they have: improved  Disposition:  After consideration of the diagnostic results and the patients response to treatment, I feel that the patent would benefit from admission.   CRITICAL CARE Performed by: Wynetta Fines   Total critical care time: 30 minutes  Critical care time was exclusive of separately billable procedures and treating  other patients.  Critical care was necessary to treat or prevent imminent or life-threatening deterioration.  Critical care was time spent personally by me on the following activities: development of treatment plan with patient and/or surrogate as well as nursing, discussions with consultants, evaluation of patient's response to treatment, examination of patient, obtaining history from patient or surrogate, ordering and performing treatments and interventions, ordering and review of laboratory studies, ordering and review of radiographic studies, pulse oximetry and re-evaluation of patient's condition.          Final Clinical Impression(s) / ED Diagnoses Final diagnoses:  Dyspnea, unspecified type    Rx / DC Orders ED Discharge Orders     None         Wynetta Fines, MD 03/14/23 [phone]

## 2023-03-15 ENCOUNTER — Inpatient Hospital Stay (HOSPITAL_COMMUNITY): Payer: Medicare HMO

## 2023-03-15 DIAGNOSIS — I5022 Chronic systolic (congestive) heart failure: Secondary | ICD-10-CM | POA: Diagnosis not present

## 2023-03-15 DIAGNOSIS — R0600 Dyspnea, unspecified: Secondary | ICD-10-CM

## 2023-03-15 LAB — ECHOCARDIOGRAM COMPLETE
AR max vel: 1.66 cm2
AV Area VTI: 1.65 cm2
AV Area mean vel: 1.61 cm2
AV Mean grad: 6.5 mm[Hg]
AV Peak grad: 13.2 mm[Hg]
Ao pk vel: 1.82 m/s
Area-P 1/2: 2.66 cm2
Height: 61 in
S' Lateral: 2.6 cm
Weight: 4112.9 [oz_av]

## 2023-03-15 LAB — BASIC METABOLIC PANEL
Anion gap: 9 (ref 5–15)
BUN: 54 mg/dL — ABNORMAL HIGH (ref 8–23)
CO2: 30 mmol/L (ref 22–32)
Calcium: 8 mg/dL — ABNORMAL LOW (ref 8.9–10.3)
Chloride: 94 mmol/L — ABNORMAL LOW (ref 98–111)
Creatinine, Ser: 1.98 mg/dL — ABNORMAL HIGH (ref 0.44–1.00)
GFR, Estimated: 24 mL/min — ABNORMAL LOW (ref >60)
Glucose, Bld: 54 mg/dL — ABNORMAL LOW (ref 70–99)
Potassium: 5.4 mmol/L — ABNORMAL HIGH (ref 3.5–5.1)
Sodium: 133 mmol/L — ABNORMAL LOW (ref 135–145)

## 2023-03-15 LAB — CBC
HCT: 45.2 % (ref 36.0–46.0)
Hemoglobin: 12.9 g/dL (ref 12.0–15.0)
MCH: 25.2 pg — ABNORMAL LOW (ref 26.0–34.0)
MCHC: 28.5 g/dL — ABNORMAL LOW (ref 30.0–36.0)
MCV: 88.5 fL (ref 80.0–100.0)
Platelets: 206 10*3/uL (ref 150–400)
RBC: 5.11 MIL/uL (ref 3.87–5.11)
RDW: 16.7 % — ABNORMAL HIGH (ref 11.5–15.5)
WBC: 8.2 10*3/uL (ref 4.0–10.5)
nRBC: 0 % (ref 0.0–0.2)

## 2023-03-15 LAB — GLUCOSE, CAPILLARY
Glucose-Capillary: 87 mg/dL (ref 70–99)
Glucose-Capillary: <10 mg/dL — CL (ref 70–99)
Glucose-Capillary: 157 mg/dL — ABNORMAL HIGH (ref 70–99)
Glucose-Capillary: 119 mg/dL — ABNORMAL HIGH (ref 70–99)
Glucose-Capillary: 130 mg/dL — ABNORMAL HIGH (ref 70–99)

## 2023-03-15 LAB — HEMOGLOBIN A1C
Hgb A1c MFr Bld: 7.1 % — ABNORMAL HIGH (ref 4.8–5.6)
Mean Plasma Glucose: 157.07 mg/dL

## 2023-03-15 MED ORDER — HEPARIN (PORCINE) 25000 UT/250ML-% IV SOLN
1500.0000 [IU]/h | INTRAVENOUS | Status: DC
  Administered 2023-03-15: 1200 [IU]/h via INTRAVENOUS
  Administered 2023-03-16 – 2023-03-17 (×3): 1500 [IU]/h via INTRAVENOUS
  Filled 2023-03-15 (×4): qty 250

## 2023-03-15 MED ORDER — TECHNETIUM TO 99M ALBUMIN AGGREGATED
4.2300 | Freq: Once | INTRAVENOUS | Status: AC
  Administered 2023-03-15: 4.23 via INTRAVENOUS

## 2023-03-15 MED ORDER — ORAL CARE MOUTH RINSE: 15.0000 mL | OROMUCOSAL | Status: DC | PRN

## 2023-03-15 MED ORDER — ALBUTEROL SULFATE (2.5 MG/3ML) 0.083% IN NEBU
2.5000 mg | INHALATION_SOLUTION | Freq: Three times a day (TID) | RESPIRATORY_TRACT | Status: DC
  Administered 2023-03-15 – 2023-03-16 (×2): 2.5 mg via RESPIRATORY_TRACT
  Filled 2023-03-15 (×3): qty 3

## 2023-03-15 MED ORDER — DEXTROSE 50 % IV SOLN
INTRAVENOUS | Status: AC
  Filled 2023-03-15: qty 50

## 2023-03-15 MED ORDER — INSULIN ASPART 100 UNIT/ML IJ SOLN
0.0000 [IU] | Freq: Three times a day (TID) | INTRAMUSCULAR | Status: DC
  Administered 2023-03-15: 2 [IU] via SUBCUTANEOUS

## 2023-03-15 MED ORDER — INSULIN ASPART 100 UNIT/ML IJ SOLN: 0.0000 [IU] | Freq: Every day | INTRAMUSCULAR | Status: DC

## 2023-03-15 MED ORDER — HEPARIN BOLUS VIA INFUSION
4000.0000 [IU] | Freq: Once | INTRAVENOUS | Status: AC
  Administered 2023-03-15: 4000 [IU] via INTRAVENOUS
  Filled 2023-03-15: qty 4000

## 2023-03-15 MED ORDER — MAGIC MOUTHWASH
5.0000 mL | Freq: Three times a day (TID) | ORAL | Status: DC | PRN
  Filled 2023-03-15: qty 5

## 2023-03-15 MED ORDER — HYDRALAZINE HCL 20 MG/ML IJ SOLN: 10.0000 mg | INTRAMUSCULAR | Status: DC | PRN

## 2023-03-15 MED ORDER — DEXTROSE-SODIUM CHLORIDE 5-0.9 % IV SOLN: INTRAVENOUS | Status: AC

## 2023-03-15 MED ORDER — SODIUM ZIRCONIUM CYCLOSILICATE 5 G PO PACK
5.0000 g | PACK | Freq: Two times a day (BID) | ORAL | Status: AC
  Administered 2023-03-15 (×2): 5 g via ORAL
  Filled 2023-03-15 (×2): qty 1

## 2023-03-15 MED ORDER — HYDRALAZINE HCL 20 MG/ML IJ SOLN
10.0000 mg | INTRAMUSCULAR | Status: DC | PRN
  Administered 2023-03-16 – 2023-03-20 (×5): 10 mg via INTRAVENOUS
  Filled 2023-03-15 (×6): qty 1

## 2023-03-15 MED ORDER — DEXTROSE 50 % IV SOLN
25.0000 g | INTRAVENOUS | Status: AC
Start: 2023-03-15 — End: 2023-03-15
  Administered 2023-03-15: 25 g via INTRAVENOUS

## 2023-03-15 MED ORDER — GERHARDT'S BUTT CREAM
TOPICAL_CREAM | Freq: Two times a day (BID) | CUTANEOUS | Status: AC
  Administered 2023-03-19 – 2023-03-23 (×3): 1 via TOPICAL
  Filled 2023-03-15: qty 1

## 2023-03-15 MED ORDER — LACTATED RINGERS IV SOLN: INTRAVENOUS | Status: DC

## 2023-03-15 NOTE — Progress Notes (Addendum)
PHARMACY - ANTICOAGULATION CONSULT NOTE  Pharmacy Consult for IV heparin Indication: new PE  Allergies  Allergen Reactions   Codeine Hives   Penicillins     REACTION: rash   Morphine Rash    REACTION: pt unsure of reaction    Patient Measurements: Height: 5\' 1"  (154.9 cm) Weight: 116.6 kg (257 lb 0.9 oz) IBW/kg (Calculated) : 47.8 Heparin Dosing Weight: 76 kg  Vital Signs: Temp: 98.4 F (36.9 C) (10/21 1804) Temp Source: Oral (10/21 1804) BP: 118/103 (10/21 1804) Pulse Rate: 64 (10/21 1804)  Labs: Recent Labs    03/14/23 1350 03/14/23 1457 03/14/23 1626 03/15/23 0338  HGB 12.1  --   --  12.9  HCT 42.7  --   --  45.2  PLT 220  --   --  206  LABPROT 14.1  --   --   --   INR 1.1  --   --   --   CREATININE  --  2.28*  --  1.98*  TROPONINIHS  --  312* 241*  --     Estimated Creatinine Clearance: 24.7 mL/min (A) (by C-G formula based on SCr of 1.98 mg/dL (H)).   Medical History: Past Medical History:  Diagnosis Date   Acute sinusitis, unspecified 05/28/2008   ANEMIA-IRON DEFICIENCY 10/26/2008   ANXIETY 10/06/2007   CHOLELITHIASIS 10/06/2007   COLONIC POLYPS, HX OF 01/26/2007   DIABETES MELLITUS, TYPE II 10/06/2007   DIVERTICULOSIS, COLON 01/26/2007   ECCHYMOSES, SPONTANEOUS 06/04/2010   GERD 01/26/2007   Heart murmur    slight heart murmur   HERNIATED DISC 01/26/2007   HYPERLIPIDEMIA 10/06/2007   HYPERTENSION 01/26/2007   LOW BACK PAIN 01/29/2007   MENOPAUSAL DISORDER 10/26/2008   OSTEOPOROSIS 06/04/2010   SHOULDER PAIN, LEFT 10/06/2007   Stroke (HCC)    TIA's    Medications:  Medications Prior to Admission  Medication Sig Dispense Refill Last Dose   acetaminophen (TYLENOL) 500 MG tablet Take 1,000 mg by mouth every 6 (six) hours as needed for mild pain or headache.   unk   ASPIRIN 81 PO Take 1 tablet by mouth daily.   03/14/2023   Cholecalciferol (VITAMIN D3 PO) Take 1 capsule by mouth daily.   03/14/2023   coal tar (NEUTROGENA T-GEL) 0.5 % shampoo Apply 1 Application  topically as needed. Monday and thursdays   03/11/2023   donepezil (ARICEPT) 10 MG tablet TAKE ONE TABLET BY MOUTH EVERYDAY AT BEDTIME 90 tablet 1 03/13/2023   glimepiride (AMARYL) 2 MG tablet Take 2 mg by mouth daily.   03/14/2023   JARDIANCE 25 MG TABS tablet Take 25 mg by mouth daily.   03/14/2023   ketoconazole (NIZORAL) 200 MG tablet Take 200 mg by mouth daily.   03/13/2023   losartan (COZAAR) 50 MG tablet Take 1 tablet (50 mg total) by mouth daily. 90 tablet 3 03/14/2023   meloxicam (MOBIC) 7.5 MG tablet TAKE ONE TABLET BY MOUTH AT NOON 30 tablet 3 03/14/2023   NOVOLOG FLEXPEN 100 UNIT/ML FlexPen Inject 0-12 Units into the skin in the morning and at bedtime. Sliding 0-200 =0 units 201-250=4units 251-300=6units 301-350=8units 351-400=10 units 401-550=12 units   03/14/2023   psyllium (REGULOID) 0.52 g capsule Take 0.52 g by mouth daily.   03/14/2023   vitamin B-12 1000 MCG tablet Take 1 tablet (1,000 mcg total) by mouth daily. 30 tablet 1 03/14/2023   alendronate (FOSAMAX) 70 MG tablet TAKE ONE TABLET BY MOUTH ONCE WEEKLY BEFORE BREAKFAST ON MONDAY 12 tablet 2 03/08/2023  furosemide (LASIX) 40 MG tablet TAKE ONE TABLET BY MOUTH daily 90 tablet 3    Scheduled:   albuterol  2.5 mg Nebulization TID   donepezil  10 mg Oral QHS   Gerhardt's butt cream   Topical BID   heparin  4,000 Units Intravenous Once   insulin aspart  0-5 Units Subcutaneous QHS   insulin aspart  0-9 Units Subcutaneous TID WC   sodium zirconium cyclosilicate  5 g Oral BID   PRN: acetaminophen **OR** acetaminophen, hydrALAZINE, magic mouthwash, ondansetron **OR** ondansetron (ZOFRAN) IV, mouth rinse, oxyCODONE  Assessment: 88 yo morbidly obese female admitted 10/20 with SOB. Started treatment for PNA at SNF without much improvement, after which she presented to [facility] with hypoxia with increasing O2 requirement. Continues on treatment for cavitary PNA, but VQ scan today found to be positive for PE. Also noted to be in  AKI (last SCr for comparison in Jan 2023 was normal) vs progressive CKD.  Baseline INR WNL, aPTT not done Prior anticoagulation: none; on LMWH 30 mg daily here, LD 10/20 @ 10p  Significant events:  Today, 03/15/2023: CBC: WNL/stable SCr remains elevated but trending down since admission No bleeding or infusion issues per nursing  Goal of Therapy: Heparin level 0.3-0.7 units/ml Monitor platelets by anticoagulation protocol: Yes  Plan: Heparin 4000 units IV bolus x 1 Heparin 1200 units/hr IV infusion Check heparin level 8 hrs after start Would increase rate cautiously; poor renal function in setting of morbid obesity puts patient at high risk of accumulating heparin Daily CBC, daily heparin level once stable Monitor for signs of bleeding or thrombosis  [name], PharmD, BCPS [phone] 03/15/2023, 7:53 PM

## 2023-03-15 NOTE — Treatment Plan (Signed)
Called by Radiology. VQ scan found to be pos for PE. Have ordered heparin gtt. Will also order LE dopplers.

## 2023-03-15 NOTE — Hospital Course (Addendum)
Brief hospital course: 6 YOF w/ CVA, HTN, T2DM, dementia. 10/20> Kathy Thomas admitted for altered mental status, acute hypoxic on chronic hypercapnic respiratory failure, cavitary pneumonia. CT chest RML pneumonia with cavitation.  CT head unremarkable. 10/21> VQ scan positive for PE.  Started on anticoagulation 10/22> PCCM consult. 10/23> transferred to stepdown for BiPAP in the setting of lethargic and hypercarbia. ongoing delirium, repeat CT head unremarkable.  10/31 PCCM signed off.  Currently requiring IV diuresis.   Assessment and Plan: Aspiration pneumonia with cavitary lesion. Recurrent presentation. PCCM was consulted. Recommend antibiotic for prolonged duration. Currently on 4 weeks course of Omnicef and Flagyl.  Last day 04/10/2023. Completed prednisone taper. Kathy Thomas will follow-up with PCCM outpatient For a repeat CT chest. SLP following.  Currently on dysphagia 2 diet.  Acute on chronic respiratory failure with hypoxia and hypercarbia. Morbid obesity with obesity hypoventilation syndrome. Undiagnosed OSA,?  Central apnea Body mass index is 49.32 kg/m.  Currently on BiPAP therapy. Will require NIV/BiPAP based on chronic hypercarbia. TOC reports [facility] has arranged BiPAP for the Kathy Thomas. Desats frequently here in the hospital on current BiPAP. Will switch to [employer] and monitor.  Acute on chronic HFpEF. Echocardiogram shows EF of 55 to 60%. Grade 1 diastolic dysfunction. No significant valvular abnormality. Still with significant volume overload. Continue with IV Lasix. Plan to switch to torsemide tomorrow. Diamox as needed for contraction alkalosis.   PE. Left leg DVT. Swelling still present. Hypoxia still present. Started on Lovenox.  Currently on Eliquis.  Continue Eliquis. Given her morbid obesity Kathy Thomas remains at high risk for failure.   Type of diabetes mellitus, uncontrolled with hyperglycemia in the setting of steroids with long-term insulin  use. Home regimen includes Amaryl, Jardiance, NovoLog. Currently on Lantus 10 units and sensitive sliding scale. Would recommend restarting Jardiance.   Hyperkalemia.  Hypernatremia Corrected.   Elevated troponin. In the setting of PE. Echocardiogram unremarkable for any wall motion abnormality. Monitor.   GERD. PPI.  Continue.   HTN. Sinus bradycardia. Blood pressure was elevated earlier.  Now better. Due to swelling of the leg will discontinue amlodipine. Resume losartan at a lower dose. Monitor for now.

## 2023-03-15 NOTE — Progress Notes (Signed)
Progress Note   Patient: Kathy Thomas:[id number] DOB: Jul 15, 1934 DOA: 03/14/2023     1 DOS: the patient was seen and examined on 03/15/2023   Brief hospital course: 88 y.o. female with medical history significant of dementia, hypertension who presented to the emergency department due to shortness of breath.  Patient was found to be hypoxic 3 days ago and underwent chest x-ray which showed concern for community-acquired pneumonia.  She was started on ertapenem and received 3 doses.  By the nursing facility she developed tachypnea and shortness of breath so EMS was called to bring her to the emergency department further assessment.  On EMS arrival she was found to be hypoglycemic.  She was brought to the ER where she was hypoxic requiring up to 6 L nasal cannula. Pt was admitted for concerns of cavitary PNA  Assessment and Plan: # Cavitary Pneumonia - Patient had infiltrates on chest x-ray in setting of worsening shortness of breath -CT chest w/o contrast reviewed. Findings notable for a large masslike consolidation of the RML with central ground-glass, cavitation, and peripheral ring of dense consolidation concerning for pulm infarct vs atypical cavitary pneumonia. Small R pleural effusion noted -Reportedly completed ertapenem PTA. Currently continued on cefepime -Presently on [facility]   # AKI -Presenting Cr of 2.2 on presentation.  -continue with IVF hydration -recheck bmet in AM -losartan on hold   # Hypertension -bp suboptimally controlled. BP meds were reportedly held at time of admit -will continue on PRN hydralazine   # Cognitive impairment -continue donepezil   #Hypoglycemia -Reportedly had been given insulin PTA at facility -Currenlty continued on D5 with SSI only      Subjective:Pleasantly confused this AM  Physical Exam: Vitals:   03/15/23 0209 03/15/23 0611 03/15/23 0907 03/15/23 1804  BP:  (!) 145/92  (!) 118/103  Pulse:  (!) 59  64  Resp:  18  16  Temp:   98.3 F (36.8 C)  98.4 F (36.9 C)  TempSrc:  Oral  Oral  SpO2: 99% 98% 97% 94%  Weight:      Height:       General exam: Awake, laying in bed, in nad Respiratory system: Normal respiratory effort, decreased BS Cardiovascular system: regular rate, s1, s2 Gastrointestinal system: Soft, nondistended, positive BS Central nervous system: CN2-12 grossly intact, strength intact Extremities: Perfused, no clubbing Skin: Normal skin turgor, no notable skin lesions seen Psychiatry: difficult to assess given mentation  Data Reviewed:  Labs reviewed: Na:133, K 5.4, Cr 1.98, WBC 8.2, hgb 12.9, Plts 206   Family Communication: Pt in room, family at bedside  Disposition: Status is: Inpatient Remains inpatient appropriate because: severity of illness  Planned Discharge Destination:  Unclear at this time     Author: Rickey Barbara, MD 03/15/2023 6:46 PM  For on call review [email or web address].

## 2023-03-15 NOTE — Progress Notes (Signed)
Hypoglycemic Event  CBG: <10  Treatment: IV dextrose 50 % solution 25 g   Symptoms: lethargic; difficult to arouse; drowsy   Follow-up CBG Time: 0800 CBG Result: 87  Possible Reasons for Event: unknown; possible meal intake  Comments/MD notified: MD notified of the event. MD placed new orders. See Order History.   Erin Obando [employer]

## 2023-03-15 NOTE — TOC Initial Note (Signed)
Transition of Care [facility]) - Initial/Assessment Note    Patient Details  Name: Kathy Thomas MRN: [id number] Date of Birth: Mar 18, 1935  Transition of Care [facility]) CM/SW Contact:    Darleene Cleaver, LCSW Phone Number: 03/15/2023, 5:11 PM  Clinical Narrative:                  Patient is an 88 year old female who has some confusion.  Patient is only alert and oriented x1.  Patient is a LTC resident at [facility].  She has been living there for about 1.5 years per daughter Kathy Thomas.  Daughter states the plan is for her to return back to SNF once she is medically ready for discharge.  CSW spoke to [place] at [facility] and she confirmed patient can return once she is medically ready for discharge.  Patient's daughter was explained role of CSW and process to coordinate getting patient back to SNF.  PT and OT have orders to work with her.  Patient is currently on 5L of oxygen, plan to work on reducing oxygen use and increase her strengthening.  Expected Discharge Plan: Skilled Nursing Facility Barriers to Discharge: Continued Medical Work up   Patient Goals and CMS Choice Patient states their goals for this hospitalization and ongoing recovery are:: To return back to [facility] CMS Medicare.gov Compare Post Acute Care list provided to:: Patient Represenative (must comment) (Patient's daughter Kathy Thomas) Choice offered to / list presented to : Adult Children [place] ownership interest in [facility].provided to:: Adult Children    Expected Discharge Plan and Services  Back to [facility] where she is LTC.   Post Acute Care Choice: Skilled Nursing Facility Living arrangements for the past 2 months: Skilled Nursing Facility                                      Prior Living Arrangements/Services Living arrangements for the past 2 months: Skilled Nursing Facility Lives with:: Facility Resident Patient language and need for interpreter  reviewed:: Yes Do you feel safe going back to the place where you live?: Yes      Need for Family Participation in Patient Care: Yes (Comment) Care giver support system in place?: Yes (comment)   Criminal Activity/Legal Involvement Pertinent to Current Situation/Hospitalization: No - Comment as needed  Activities of Daily Living   ADL Screening (condition at time of admission) Independently performs ADLs?: No Does the patient have a NEW difficulty with bathing/dressing/toileting/self-feeding that is expected to last >3 days?: No Does the patient have a NEW difficulty with getting in/out of bed, walking, or climbing stairs that is expected to last >3 days?: No Does the patient have a NEW difficulty with communication that is expected to last >3 days?: No Is the patient deaf or have difficulty hearing?: No Does the patient have difficulty seeing, even when wearing glasses/contacts?: No Does the patient have difficulty concentrating, remembering, or making decisions?: Yes  Permission Sought/Granted Permission sought to share information with : Case Manager, Magazine features editor, Family Supports Permission granted to share information with : Yes, Release of Information Signed, Yes, Verbal Permission Granted  Share Information with NAME: Kathy Thomas Daughter   [phone]  Thomas,Kathy Granddaughter   [phone]  Permission granted to share info w AGENCY: SNF admissions        Emotional Assessment Appearance:: Appears stated age Attitude/Demeanor/Rapport: Other (comment) Affect (typically observed):  Accepting, Calm, Stable Orientation: : Oriented to Self Alcohol / Substance Use: Not Applicable Psych Involvement: No (comment)  Admission diagnosis:  Community acquired pneumonia [J18.9] Dyspnea, unspecified type [R06.00] Patient Active Problem List   Diagnosis Date Noted   Community acquired pneumonia 03/14/2023   Aortic atherosclerosis (HCC) 08/14/2020   Encounter  for well adult exam with abnormal findings 08/14/2020   Submandibular gland infection 03/15/2020   Pneumonia due to COVID-19 virus 02/01/2020   Acute respiratory failure with hypoxia (HCC) 02/01/2020   Protein calorie malnutrition (HCC) 02/01/2020   Thrombocytopenia (HCC) 02/01/2020   Encounter for examination for admission to assisted living facility 10/20/2019   Pain due to onychomycosis of toenails of both feet 12/20/2018   Diabetes mellitus without complication (HCC) 12/20/2018   Diarrhea 03/07/2018   Acute right ankle pain 04/08/2017   Weight loss 06/25/2016   Iron deficiency 12/30/2015   Generalized weakness 07/08/2014   Diastolic CHF (HCC) 07/07/2014   Vitamin B12 deficiency 07/06/2014   Left fibular fracture 07/05/2014   Closed fibular fracture 07/05/2014   Left ankle pain 07/04/2014   Left knee pain 07/04/2014   Gait disorder 01/10/2014   Ruptured sebaceous cyst 10/16/2013   Dementia without behavioral disturbance (HCC) 09/08/2013   Left lumbar radiculopathy 10/28/2011   Grief reaction 10/28/2011   Insect bite, infected 10/28/2011   Preventative health care 12/06/2010   Osteoporosis 06/04/2010   ECCHYMOSES, SPONTANEOUS 06/04/2010   ANEMIA-IRON DEFICIENCY 10/26/2008   MENOPAUSAL DISORDER 10/26/2008   DM type 2 (diabetes mellitus, type 2) (HCC) 10/06/2007   Hyperlipidemia 10/06/2007   ANXIETY 10/06/2007   CHOLELITHIASIS 10/06/2007   SHOULDER PAIN, LEFT 10/06/2007   LOW BACK PAIN 01/29/2007   Essential hypertension 01/26/2007   GERD 01/26/2007   DIVERTICULOSIS, COLON 01/26/2007   HERNIATED DISC 01/26/2007   History of colonic polyps 01/26/2007   PCP:  Marden Noble, MD (Inactive) Pharmacy:   [facility] [address], [place] - [address] [facility] [place] [address] Phone: [phone] Fax: [phone]  [facility] - [place], [place] - [address] [place] [address] Phone:  [phone] Fax: [phone]  [facility] - Lequita Halt, [facility] - [address] [place] [address] Phone: [phone] Fax: [phone]     Social Determinants of Health (SDOH) Social History: SDOH Screenings   Food Insecurity: No Food Insecurity (03/14/2023)  Housing: Low Risk  (03/14/2023)  Transportation Needs: No Transportation Needs (03/14/2023)  Utilities: Not At Risk (03/14/2023)  Depression (PHQ2-9): Low Risk  (08/14/2020)  Tobacco Use: Medium Risk (03/14/2023)   SDOH Interventions:     Readmission Risk Interventions     No data to display

## 2023-03-15 NOTE — Progress Notes (Signed)
Patient daughter [name] [phone]) would like an update on results of scans and plan for patient. Thanks

## 2023-03-15 NOTE — Progress Notes (Signed)
     Attempted Echo. Family member asked if we could do the test later; wanted patient to finish eating first.   Laddie Aquas 03/15/2023, 8:35 AM

## 2023-03-15 NOTE — Progress Notes (Signed)
*  PRELIMINARY RESULTS* Echocardiogram 2D Echocardiogram has been performed. Patient refused to finish exam.   Kathy Thomas 03/15/2023, 2:11 PM

## 2023-03-15 NOTE — Plan of Care (Signed)
  Problem: Coping: Goal: Level of anxiety will decrease Outcome: Progressing   Problem: Elimination: Goal: Will not experience complications related to bowel motility Outcome: Progressing Goal: Will not experience complications related to urinary retention Outcome: Progressing   Problem: Pain Managment: Goal: General experience of comfort will improve Outcome: Progressing   Problem: Safety: Goal: Ability to remain free from injury will improve Outcome: Progressing   Problem: Skin Integrity: Goal: Risk for impaired skin integrity will decrease Outcome: Progressing   Problem: Respiratory: Goal: Ability to maintain adequate ventilation will improve Outcome: Progressing

## 2023-03-15 NOTE — Consult Note (Signed)
WOC Nurse Consult Note: Reason for Consult: MASD and possible pressure injury to the buttock  Wound type: Irritant contact dermatitis ICD-10 CM Codes for Irritant Dermatitis L24A2 - Due to fecal, urinary or dual incontinence L30.4  - Erythema intertrigo. Also used for abrasion of the hand, chafing of the skin, dermatitis due to sweating and friction, friction dermatitis, friction eczema, and genital/thigh intertrigo.  Stage 2 Pressure injury in the presence of ICD Pressure Injury POA: Yes Measurement: redness to the buttocks, abdomen and groin Buttock; 1.5cm x 1.5cm  Wound RUE:[id number]  Drainage (amount, consistency, odor) none Periwound:intact  Dressing procedure/placement/frequency: Order [facility] [place] # [phone] Measure and cut length of InterDry to fit in skin folds that have skin breakdown Tuck InterDry fabric into skin folds in a single layer, allow for 2 inches of overhang from skin edges to allow for wicking to occur May remove to bathe; dry area thoroughly and then tuck into affected areas again Do not apply any creams or ointments when using InterDry DO NOT THROW AWAY FOR 5 DAYS unless soiled with stool DO NOT [facility] product, this will inactivate the silver in the material  New sheet of Interdry should be applied after 5 days of use if patient continues to have skin breakdown   To the affected areas of the groin, inflammatory, and pannus  Gerhardt's butt cream to affected areas of the bilateral buttocks Silicone foam dressings to the buttock,        change every 3 days. ASSESS UNDER dressings each shift for any acute changes in the wounds.     Re consult if needed, will not follow at this time. Thanks  Lashala Laser [employer], RN,CWOCN, CNS, CWON-AP [phone])

## 2023-03-16 ENCOUNTER — Inpatient Hospital Stay (HOSPITAL_COMMUNITY): Payer: Medicare HMO

## 2023-03-16 DIAGNOSIS — R0600 Dyspnea, unspecified: Secondary | ICD-10-CM | POA: Diagnosis not present

## 2023-03-16 DIAGNOSIS — Z86711 Personal history of pulmonary embolism: Secondary | ICD-10-CM

## 2023-03-16 DIAGNOSIS — M7989 Other specified soft tissue disorders: Secondary | ICD-10-CM

## 2023-03-16 DIAGNOSIS — I2699 Other pulmonary embolism without acute cor pulmonale: Secondary | ICD-10-CM | POA: Diagnosis present

## 2023-03-16 DIAGNOSIS — J189 Pneumonia, unspecified organism: Secondary | ICD-10-CM | POA: Diagnosis not present

## 2023-03-16 DIAGNOSIS — J984 Other disorders of lung: Secondary | ICD-10-CM

## 2023-03-16 LAB — GLUCOSE, CAPILLARY
Glucose-Capillary: 83 mg/dL (ref 70–99)
Glucose-Capillary: 186 mg/dL — ABNORMAL HIGH (ref 70–99)
Glucose-Capillary: 207 mg/dL — ABNORMAL HIGH (ref 70–99)
Glucose-Capillary: 109 mg/dL — ABNORMAL HIGH (ref 70–99)
Glucose-Capillary: 152 mg/dL — ABNORMAL HIGH (ref 70–99)

## 2023-03-16 LAB — COMPREHENSIVE METABOLIC PANEL
ALT: 12 U/L (ref 0–44)
AST: 10 U/L — ABNORMAL LOW (ref 15–41)
Albumin: 2 g/dL — ABNORMAL LOW (ref 3.5–5.0)
Alkaline Phosphatase: 55 U/L (ref 38–126)
Anion gap: 10 (ref 5–15)
BUN: 43 mg/dL — ABNORMAL HIGH (ref 8–23)
CO2: 25 mmol/L (ref 22–32)
Calcium: 8 mg/dL — ABNORMAL LOW (ref 8.9–10.3)
Chloride: 99 mmol/L (ref 98–111)
Creatinine, Ser: 1.18 mg/dL — ABNORMAL HIGH (ref 0.44–1.00)
GFR, Estimated: 45 mL/min — ABNORMAL LOW (ref >60)
Glucose, Bld: 65 mg/dL — ABNORMAL LOW (ref 70–99)
Potassium: 5.6 mmol/L — ABNORMAL HIGH (ref 3.5–5.1)
Sodium: 134 mmol/L — ABNORMAL LOW (ref 135–145)
Total Bilirubin: 0.6 mg/dL (ref 0.3–1.2)
Total Protein: 6.2 g/dL — ABNORMAL LOW (ref 6.5–8.1)

## 2023-03-16 LAB — CBC
HCT: 42 % (ref 36.0–46.0)
Hemoglobin: 11.7 g/dL — ABNORMAL LOW (ref 12.0–15.0)
MCH: 25.4 pg — ABNORMAL LOW (ref 26.0–34.0)
MCHC: 27.9 g/dL — ABNORMAL LOW (ref 30.0–36.0)
MCV: 91.1 fL (ref 80.0–100.0)
Platelets: 211 10*3/uL (ref 150–400)
RBC: 4.61 MIL/uL (ref 3.87–5.11)
RDW: 16.8 % — ABNORMAL HIGH (ref 11.5–15.5)
WBC: 8.7 10*3/uL (ref 4.0–10.5)
nRBC: 0 % (ref 0.0–0.2)

## 2023-03-16 LAB — HEPARIN LEVEL (UNFRACTIONATED)
Heparin Unfractionated: 0.16 [IU]/mL — ABNORMAL LOW (ref 0.30–0.70)
Heparin Unfractionated: 0.53 [IU]/mL (ref 0.30–0.70)
Heparin Unfractionated: 0.64 [IU]/mL (ref 0.30–0.70)

## 2023-03-16 MED ORDER — SODIUM ZIRCONIUM CYCLOSILICATE 5 G PO PACK
5.0000 g | PACK | Freq: Two times a day (BID) | ORAL | Status: DC
  Filled 2023-03-16: qty 1

## 2023-03-16 MED ORDER — HEPARIN BOLUS VIA INFUSION
2000.0000 [IU] | Freq: Once | INTRAVENOUS | Status: AC
  Administered 2023-03-16: 2000 [IU] via INTRAVENOUS
  Filled 2023-03-16: qty 2000

## 2023-03-16 MED ORDER — INSULIN ASPART 100 UNIT/ML IJ SOLN
0.0000 [IU] | Freq: Three times a day (TID) | INTRAMUSCULAR | Status: DC
  Administered 2023-03-16: 2 [IU] via SUBCUTANEOUS
  Administered 2023-03-17 (×2): 1 [IU] via SUBCUTANEOUS
  Administered 2023-03-18: 2 [IU] via SUBCUTANEOUS
  Administered 2023-03-18: 3 [IU] via SUBCUTANEOUS
  Administered 2023-03-18 – 2023-03-19 (×2): 2 [IU] via SUBCUTANEOUS

## 2023-03-16 MED ORDER — SODIUM ZIRCONIUM CYCLOSILICATE 10 G PO PACK
10.0000 g | PACK | Freq: Two times a day (BID) | ORAL | Status: AC
  Administered 2023-03-16 (×2): 10 g via ORAL
  Filled 2023-03-16 (×2): qty 1

## 2023-03-16 MED ORDER — SODIUM CHLORIDE 0.9 % IV SOLN
2.0000 g | INTRAVENOUS | Status: DC
  Administered 2023-03-16 – 2023-03-23 (×8): 2 g via INTRAVENOUS
  Filled 2023-03-16 (×8): qty 20

## 2023-03-16 MED ORDER — DEXTROSE-SODIUM CHLORIDE 5-0.9 % IV SOLN: INTRAVENOUS | Status: DC

## 2023-03-16 MED ORDER — INSULIN ASPART 100 UNIT/ML IJ SOLN
0.0000 [IU] | Freq: Every day | INTRAMUSCULAR | Status: DC
  Administered 2023-03-17: 2 [IU] via SUBCUTANEOUS
  Administered 2023-03-18: 4 [IU] via SUBCUTANEOUS

## 2023-03-16 MED ORDER — METRONIDAZOLE 500 MG/100ML IV SOLN
500.0000 mg | Freq: Two times a day (BID) | INTRAVENOUS | Status: DC
  Administered 2023-03-16 – 2023-03-24 (×17): 500 mg via INTRAVENOUS
  Filled 2023-03-16 (×17): qty 100

## 2023-03-16 MED ORDER — SODIUM CHLORIDE 0.9 % IV SOLN
2.0000 g | Freq: Two times a day (BID) | INTRAVENOUS | Status: DC
  Administered 2023-03-16: 2 g via INTRAVENOUS
  Filled 2023-03-16: qty 12.5

## 2023-03-16 MED ORDER — ALBUTEROL SULFATE (2.5 MG/3ML) 0.083% IN NEBU
2.5000 mg | INHALATION_SOLUTION | Freq: Two times a day (BID) | RESPIRATORY_TRACT | Status: DC
  Administered 2023-03-16: 2.5 mg via RESPIRATORY_TRACT
  Filled 2023-03-16: qty 3

## 2023-03-16 NOTE — Progress Notes (Addendum)
PHARMACY - ANTICOAGULATION CONSULT NOTE  Pharmacy Consult for IV heparin Indication: new PE  Allergies  Allergen Reactions   Codeine Hives   Penicillins     REACTION: rash   Morphine Rash    REACTION: pt unsure of reaction    Patient Measurements: Height: 5\' 1"  (154.9 cm) Weight: 116.6 kg (257 lb 0.9 oz) IBW/kg (Calculated) : 47.8 Heparin Dosing Weight: 76 kg  Vital Signs: Temp: 99.9 F (37.7 C) (10/22 0526) Temp Source: Oral (10/22 0526) BP: 156/105 (10/22 0526) Pulse Rate: 64 (10/22 0526)  Labs: Recent Labs    03/14/23 1350 03/14/23 1457 03/14/23 1626 03/15/23 0338 03/16/23 0419  HGB 12.1  --   --  12.9 11.7*  HCT 42.7  --   --  45.2 42.0  PLT 220  --   --  206 211  LABPROT 14.1  --   --   --   --   INR 1.1  --   --   --   --   HEPARINUNFRC  --   --   --   --  0.16*  CREATININE  --  2.28*  --  1.98* 1.18*  TROPONINIHS  --  312* 241*  --   --     Estimated Creatinine Clearance: 41.4 mL/min (A) (by C-G formula based on SCr of 1.18 mg/dL (H)).   Medical History: Past Medical History:  Diagnosis Date   Acute sinusitis, unspecified 05/28/2008   ANEMIA-IRON DEFICIENCY 10/26/2008   ANXIETY 10/06/2007   CHOLELITHIASIS 10/06/2007   COLONIC POLYPS, HX OF 01/26/2007   DIABETES MELLITUS, TYPE II 10/06/2007   DIVERTICULOSIS, COLON 01/26/2007   ECCHYMOSES, SPONTANEOUS 06/04/2010   GERD 01/26/2007   Heart murmur    slight heart murmur   HERNIATED DISC 01/26/2007   HYPERLIPIDEMIA 10/06/2007   HYPERTENSION 01/26/2007   LOW BACK PAIN 01/29/2007   MENOPAUSAL DISORDER 10/26/2008   OSTEOPOROSIS 06/04/2010   SHOULDER PAIN, LEFT 10/06/2007   Stroke (HCC)    TIA's    Medications:  Medications Prior to Admission  Medication Sig Dispense Refill Last Dose   acetaminophen (TYLENOL) 500 MG tablet Take 1,000 mg by mouth every 6 (six) hours as needed for mild pain or headache.   unk   ASPIRIN 81 PO Take 1 tablet by mouth daily.   03/14/2023   Cholecalciferol (VITAMIN D3 PO) Take 1 capsule by  mouth daily.   03/14/2023   coal tar (NEUTROGENA T-GEL) 0.5 % shampoo Apply 1 Application topically as needed. Monday and thursdays   03/11/2023   donepezil (ARICEPT) 10 MG tablet TAKE ONE TABLET BY MOUTH EVERYDAY AT BEDTIME 90 tablet 1 03/13/2023   glimepiride (AMARYL) 2 MG tablet Take 2 mg by mouth daily.   03/14/2023   JARDIANCE 25 MG TABS tablet Take 25 mg by mouth daily.   03/14/2023   ketoconazole (NIZORAL) 200 MG tablet Take 200 mg by mouth daily.   03/13/2023   losartan (COZAAR) 50 MG tablet Take 1 tablet (50 mg total) by mouth daily. 90 tablet 3 03/14/2023   meloxicam (MOBIC) 7.5 MG tablet TAKE ONE TABLET BY MOUTH AT NOON 30 tablet 3 03/14/2023   NOVOLOG FLEXPEN 100 UNIT/ML FlexPen Inject 0-12 Units into the skin in the morning and at bedtime. Sliding 0-200 =0 units 201-250=4units 251-300=6units 301-350=8units 351-400=10 units 401-550=12 units   03/14/2023   psyllium (REGULOID) 0.52 g capsule Take 0.52 g by mouth daily.   03/14/2023   vitamin B-12 1000 MCG tablet Take 1 tablet (1,000 mcg  total) by mouth daily. 30 tablet 1 03/14/2023   alendronate (FOSAMAX) 70 MG tablet TAKE ONE TABLET BY MOUTH ONCE WEEKLY BEFORE BREAKFAST ON MONDAY 12 tablet 2 03/08/2023   furosemide (LASIX) 40 MG tablet TAKE ONE TABLET BY MOUTH daily 90 tablet 3    Scheduled:   albuterol  2.5 mg Nebulization TID   donepezil  10 mg Oral QHS   Gerhardt's butt cream   Topical BID   insulin aspart  0-5 Units Subcutaneous QHS   insulin aspart  0-9 Units Subcutaneous TID WC   PRN: acetaminophen **OR** acetaminophen, hydrALAZINE, magic mouthwash, ondansetron **OR** ondansetron (ZOFRAN) IV, mouth rinse, oxyCODONE  Assessment: 88 yo morbidly obese female admitted 10/20 with SOB. Started treatment for PNA at SNF without much improvement, after which she presented to [facility] with hypoxia with increasing O2 requirement. Continues on treatment for cavitary PNA, but VQ scan today found to be positive for PE. Also noted to be  in AKI (last SCr for comparison in Jan 2023 was normal) vs progressive CKD.  Baseline INR WNL, CBC WNL Prior anticoagulation: none; on LMWH 30 mg daily here, LD 10/20 @ 10p  Today, 03/16/2023: Heparin level 0.16- subtherapeutic on IV heparin 1200 units/hr CBC- Hg 11.7- slightly low, pltc WNL No bleeding or infusion issues per nursing.  She had hematoma secondary to IV infiltration earlier but IV site changed. Scr improved to 1.18  Goal of Therapy: Heparin level 0.3-0.7 units/ml Monitor platelets by anticoagulation protocol: Yes  Plan: Heparin 2000 units IV bolus x 1 Increase Heparin 1500 units/hr IV infusion Check heparin level 8 hrs after start Daily CBC, daily heparin level once stable Monitor for signs of bleeding or thrombosis  Junita Push, PharmD, BCPS 03/16/2023, 5:40 AM

## 2023-03-16 NOTE — Plan of Care (Signed)

## 2023-03-16 NOTE — Progress Notes (Signed)
Progress Note   Patient: Kathy Thomas ZOX:[id number] DOB: [birth year] DOA: 03/14/2023     2 DOS: the patient was seen and examined on 03/16/2023   Brief hospital course: 88 y.o. female with medical history significant of dementia, hypertension who presented to the emergency department due to shortness of breath.  Patient was found to be hypoxic 3 days ago and underwent chest x-ray which showed concern for community-acquired pneumonia.  She was started on ertapenem and received 3 doses.  By the nursing facility she developed tachypnea and shortness of breath so EMS was called to bring her to the emergency department further assessment.  On EMS arrival she was found to be hypoglycemic.  She was brought to the ER where she was hypoxic requiring up to 6 L nasal cannula. Pt was admitted for concerns of cavitary PNA  Assessment and Plan: # Cavitary Pneumonia - Patient had infiltrates on chest x-ray in setting of worsening shortness of breath -CT chest w/o contrast reviewed. Findings notable for a large masslike consolidation of the RML with central ground-glass, cavitation, and peripheral ring of dense consolidation concerning for pulm infarct vs atypical cavitary pneumonia. Small R pleural effusion noted -Reportedly completed ertapenem PTA.  -Pulmonary consulted, appreciate recs. Pt now on rocephin with flagyl. Plan 4-6 weeks of antibiotics and re-image at that time -Presently on 5LNC, pt is O2 naive  #PE with L popliteal vein DVT present on admit -VQ reviewed. Findings confirm PE and dopplers confirm LLE DVT -Continued on heparin gtt, anticipate DOAC when pt is more alert   # AKI -Presenting Cr of 2.2 on presentation.  -continue with IVF hydration -losartan remains on hold -Cr is improving   # Hypertension -bp suboptimally controlled. BP meds were reportedly held at time of admit -will continue on PRN hydralazine   # Cognitive impairment -continue donepezil    #Hypoglycemia -Reportedly had been given insulin PTA at facility -Currenlty continued on D5 with SSI as needed  #Hyperkalemia -Will give lokelma -Recheck bmet in AM      Subjective:Remains very drowsy, but arousable. Unable to assess  Physical Exam: Vitals:   03/16/23 0841 03/16/23 0917 03/16/23 1220 03/16/23 1645  BP:  (!) 146/48 (!) 157/67   Pulse:  70 62   Resp:  18    Temp:  98.6 F (37 C) 97.7 F (36.5 C) 99.1 F (37.3 C)  TempSrc:  Oral Oral Oral  SpO2: 96% 94% 94%   Weight:      Height:       General exam: Asleep, arousable, in no acute distress Respiratory system: normal chest rise, clear, no audible wheezing Cardiovascular system: regular rhythm, s1-s2 Gastrointestinal system: Nondistended, nontender, pos BS Central nervous system: No seizures, no tremors Extremities: No cyanosis, no joint deformities Skin: No rashes, no pallor Psychiatry: Unable to assess given mentation  Data Reviewed:  Labs reviewed: Na:134, K 5.6, Cr 1.18, WBC 8.7, Hgb 11.7, Plts 211   Family Communication: Pt in room, family at bedside  Disposition: Status is: Inpatient Remains inpatient appropriate because: severity of illness  Planned Discharge Destination:  Unclear at this time     Author: Rickey Barbara, MD 03/16/2023 6:11 PM  For on call review [email or web address].

## 2023-03-16 NOTE — Progress Notes (Signed)
Left upper extremity venous duplex and bilateral lower extremity venous duplex has been completed. Preliminary results can be found in CV Proc through chart review.  Results were given to Joneen Roach NP.  03/16/23 12:01 PM Olen Cordial RVT

## 2023-03-16 NOTE — TOC Progression Note (Signed)
Transition of Care [facility]) - Progression Note    Patient Details  Name: KAILYNE WOOLFORK MRN: [id number] Date of Birth: [birth year]  [facility]) CM/SW Contact  Howell Rucks, RN Phone Number: 03/16/2023, 10:50 AM  Clinical Narrative:  Updated FL2 completed. DC plan for pt to return to [facility]. TOC will continue to follow.      Expected Discharge Plan: Skilled Nursing Facility Barriers to Discharge: Continued Medical Work up  Expected Discharge Plan and Services     Post Acute Care Choice: Skilled Nursing Facility Living arrangements for the past 2 months: Skilled Nursing Facility                                       Social Determinants of Health (SDOH) Interventions SDOH Screenings   Food Insecurity: No Food Insecurity (03/14/2023)  Housing: Low Risk  (03/14/2023)  Transportation Needs: No Transportation Needs (03/14/2023)  Utilities: Not At Risk (03/14/2023)  Depression (PHQ2-9): Low Risk  (08/14/2020)  Tobacco Use: Medium Risk (03/14/2023)    Readmission Risk Interventions     No data to display

## 2023-03-16 NOTE — Progress Notes (Signed)
PHARMACY - ANTICOAGULATION CONSULT NOTE  Pharmacy Consult for heparin  Indication: acute pulmonary embolus and DVT  Allergies  Allergen Reactions   Codeine Hives   Penicillins     REACTION: rash   Morphine Rash    REACTION: pt unsure of reaction    Patient Measurements: Height: 5\' 1"  (154.9 cm) Weight: 116.6 kg (257 lb 0.9 oz) IBW/kg (Calculated) : 47.8 Heparin Dosing Weight: 77 kg  Vital Signs: Temp: 97.7 F (36.5 C) (10/22 1220) Temp Source: Oral (10/22 1220) BP: 157/67 (10/22 1220) Pulse Rate: 62 (10/22 1220)  Labs: Recent Labs    03/14/23 1350 03/14/23 1457 03/14/23 1626 03/15/23 0338 03/16/23 0419  HGB 12.1  --   --  12.9 11.7*  HCT 42.7  --   --  45.2 42.0  PLT 220  --   --  206 211  LABPROT 14.1  --   --   --   --   INR 1.1  --   --   --   --   HEPARINUNFRC  --   --   --   --  0.16*  CREATININE  --  2.28*  --  1.98* 1.18*  TROPONINIHS  --  312* 241*  --   --     Estimated Creatinine Clearance: 41.4 mL/min (A) (by C-G formula based on SCr of 1.18 mg/dL (H)).   Medical History: Past Medical History:  Diagnosis Date   Acute sinusitis, unspecified 05/28/2008   ANEMIA-IRON DEFICIENCY 10/26/2008   ANXIETY 10/06/2007   CHOLELITHIASIS 10/06/2007   COLONIC POLYPS, HX OF 01/26/2007   DIABETES MELLITUS, TYPE II 10/06/2007   DIVERTICULOSIS, COLON 01/26/2007   ECCHYMOSES, SPONTANEOUS 06/04/2010   GERD 01/26/2007   Heart murmur    slight heart murmur   HERNIATED DISC 01/26/2007   HYPERLIPIDEMIA 10/06/2007   HYPERTENSION 01/26/2007   LOW BACK PAIN 01/29/2007   MENOPAUSAL DISORDER 10/26/2008   OSTEOPOROSIS 06/04/2010   SHOULDER PAIN, LEFT 10/06/2007   Stroke (HCC)    TIA's    Assessment: Patient is an 88 y.o F who presented to the ED on 03/14/23 with c/o SOB. V/Q scan on 03/14/23 showed findings that's consistent with PE. LE doppler: acute deep vein thrombosis involving the left  popliteal vein. She's currently on heparin drip for VTE treatment.  Today, 03/16/2023: -  heparin level collected at 1:43p is therapeutic at 0.53 after rate increased to 1500 units/hr - cbc stable - no bleeding documented - scr trending down    Goal of Therapy:  Heparin level 0.3-0.7 units/ml Monitor platelets by anticoagulation protocol: Yes   Plan:  - continue heparin drip at 1500 units/hr - check another 8 hr heparin level to confirm level remains therapeutic before changing to daily monitoring  - monitor for s/sx bleeding   Tharon Bomar P 03/16/2023,2:24 PM

## 2023-03-16 NOTE — Progress Notes (Signed)
Pharmacy Antibiotic Note  Kathy Thomas is a 88 y.o. female on ertapenem PTA for PNA who presented to the ED on 03/14/2023 with c/o SOB. CXR on 03/14/23 showed, "Dense airspace consolidation in the periphery of the right mid lung suspicious for pneumonia." She was started on vancomycin and cefepime on admission for infection. MRSA PCR came back negative with vancomycin d/ced on 10/21.  Today, 03/16/2023: - day #2 abx - Tmax 99.9 - wbc 8.7 - scr trending down with 1.18 today (crcl ~41) - all cultures have been negative thus far  Plan: - adjust cefepime to 2gm IV q12h - monitor renal function closely and adjust if needed  _____________________________________________ Height: 5\' 1"  (154.9 cm) Weight: 116.6 kg (257 lb 0.9 oz) IBW/kg (Calculated) : 47.8  Temp (24hrs), Avg:98.8 F (37.1 C), Min:98.1 F (36.7 C), Max:99.9 F (37.7 C)  Recent Labs  Lab 03/14/23 1350 03/14/23 1424 03/14/23 1457 03/14/23 1633 03/15/23 0338 03/16/23 0419  WBC 13.9*  --   --   --  8.2 8.7  CREATININE  --   --  2.28*  --  1.98* 1.18*  LATICACIDVEN  --  1.5  --  0.9  --   --     Estimated Creatinine Clearance: 41.4 mL/min (A) (by C-G formula based on SCr of 1.18 mg/dL (H)).    Allergies  Allergen Reactions   Codeine Hives   Penicillins     REACTION: rash   Morphine Rash    REACTION: pt unsure of reaction     Thank you for allowing pharmacy to be a part of this patient's care.  Lucia Gaskins 03/16/2023 8:55 AM

## 2023-03-16 NOTE — Evaluation (Signed)
Clinical/Bedside Swallow Evaluation Patient Details  Name: Kathy Thomas MRN: [id number] Date of Birth: Nov 06, 1934  Today's Date: 03/16/2023 Time: SLP Start Time (ACUTE ONLY): 1001 SLP Stop Time (ACUTE ONLY): 1039 SLP Time Calculation (min) (ACUTE ONLY): 38 min  Past Medical History:  Past Medical History:  Diagnosis Date   Acute sinusitis, unspecified 05/28/2008   ANEMIA-IRON DEFICIENCY 10/26/2008   ANXIETY 10/06/2007   CHOLELITHIASIS 10/06/2007   COLONIC POLYPS, HX OF 01/26/2007   DIABETES MELLITUS, TYPE II 10/06/2007   DIVERTICULOSIS, COLON 01/26/2007   ECCHYMOSES, SPONTANEOUS 06/04/2010   GERD 01/26/2007   Heart murmur    slight heart murmur   HERNIATED DISC 01/26/2007   HYPERLIPIDEMIA 10/06/2007   HYPERTENSION 01/26/2007   LOW BACK PAIN 01/29/2007   MENOPAUSAL DISORDER 10/26/2008   OSTEOPOROSIS 06/04/2010   SHOULDER PAIN, LEFT 10/06/2007   Stroke (HCC)    TIA's   Past Surgical History:  Past Surgical History:  Procedure Laterality Date   ABDOMINAL HYSTERECTOMY     APPENDECTOMY     Fracture left  wrist  2010   Dr. Amanda Pea   Herniated disk   1995   C-spine   Left ankle-screw placement  1980   Left elbow-nerve impingement  1985   TONSILLECTOMY     HPI:  Patient is a 88 year old female admitted to [facility] long hospital with altered mental status, hypoxia, diagnosed with pneumonia and placed on treatment.  Past medical history positive for COVID-19 a few months ago, dementia, CHF, gait abnormalities.  CT chest showed pulmonary infarct as well as groundglass opacities in the right middle lobe and a hiatal hernia.  Swallow evaluation ordered.  [name], daughter reports patient has been able to feed herself with her right and has not had dysphagia prior to recent illness.    Assessment / Plan / Recommendation  Clinical Impression  At this time patient is lethargic and inconsistently follow directions.  When she did awake her speech was only mildly dysarthric and likely more associated with  fatigue lethargy rather narrow based dysarthria.  Patient noted to have secretions retained especially in the left lateral sulci without awareness and without swallowing per cue.  She required consistent stimulation to attempt to stay alert.  SLP set up oral suction and provided mouth care noting patient with ulceration behind the lower lip midline, thus did not attempt to place dentures.  Patient also with some white areas on her tongue concerning for potential oral candidiasis and at this time she is being treated with Magic mouthwash.  Daughter, [name], assures SLP that she is only feeding patient when she is fully alert, thus recommended she consider teaspoons of liquids and thick creamy creamy pures as tolerates.  Advised cannot rule out aspiration and patient with baseline cough.  With known history of CHF and current pneumonia likely as source especially as her voice is clear (not gurgly) when she speaks.  Modified diet to mitigate aspiration and created swallow precautions sign.  Will follow-up to determine if instrumental evaluation is indicated but anticipate when/if patient's mentation improves, she will be able to manage a p.o. diet.  Inform daughter to recommendations and asked RN to contact this SLP if patient awakens adequately for p.o. intake.  Daughter reports patient woke yesterday to eat breakfast and then slept the remainder of the day therefore we will plan to return 10/23 unless hear otherwise. SLP Visit Diagnosis: Dysphagia, oral phase (R13.11)    Aspiration Risk  Mild aspiration risk;Moderate aspiration risk    Diet Recommendation  Dysphagia 1 (Puree);Thin liquid    Liquid Administration via: Spoon Medication Administration: Whole meds with puree Supervision: Full supervision/cueing for compensatory strategies Compensations: Minimize environmental distractions;Slow rate;Small sips/bites (Oral suction patient does not swallow)    Other  Recommendations Oral Care Recommendations:  Oral care BID Caregiver Recommendations: Have oral suction available    Recommendations for follow up therapy are one component of a multi-disciplinary discharge planning process, led by the attending physician.  Recommendations may be updated based on patient status, additional functional criteria and insurance authorization.  Follow up Recommendations Skilled nursing-short term rehab (<3 hours/day)      Assistance Recommended at Discharge    Functional Status Assessment Patient has had a recent decline in their functional status and demonstrates the ability to make significant improvements in function in a reasonable and predictable amount of time.  Frequency and Duration min 1 x/week  1 week       Prognosis Prognosis for improved oropharyngeal function: Fair Barriers to Reach Goals: Other (Comment) (Mentation)      Swallow Study   General Date of Onset: 03/15/23 HPI: Patient is a 88 year old female admitted to [facility] long hospital with altered mental status, hypoxia, diagnosed with pneumonia and placed on treatment.  Past medical history positive for COVID-19 a few months ago, dementia, CHF, gait abnormalities.  CT chest showed pulmonary infarct as well as groundglass opacities in the right middle lobe and a hiatal hernia.  Swallow evaluation ordered.  [name], daughter reports patient has been able to feed herself with her right and has not had dysphagia prior to recent illness. Type of Study: Bedside Swallow Evaluation Previous Swallow Assessment: none in the system Diet Prior to this Study: Regular;Thin liquids (Level 0) Temperature Spikes Noted: No Respiratory Status: Nasal cannula History of Recent Intubation: No Behavior/Cognition: Lethargic/Drowsy Oral Cavity Assessment: Lesions;Excessive secretions Oral Care Completed by SLP: Yes Oral Cavity - Dentition: Dentures, top;Dentures, bottom Vision: Functional for self-feeding Self-Feeding Abilities: Other (Comment) Patient  Positioning: Upright in bed Baseline Vocal Quality: Normal Volitional Cough: Cognitively unable to elicit Volitional Swallow: Unable to elicit    Oral/Motor/Sensory Function Overall Oral Motor/Sensory Function: Mild impairment (Patient is lethargic and thus is difficult to differentiate between her not following directions and slightly being tilted to the left versus slight lingual deviation to the left at rest.  However largely presents with generalized weakness) Facial ROM:  (Total cues required for patient to seal lips, patient response to daughter) Velum: Within Functional Limits   Ice Chips Ice chips: Impaired Presentation: Spoon Oral Phase Impairments: Reduced labial seal;Reduced lingual movement/coordination;Poor awareness of bolus (Patient did not orally transit single ice chip to swallow nor did she swallow on command.  SLP provided oral suction)   Thin Liquid Thin Liquid: Not tested    Nectar Thick Nectar Thick Liquid: Not tested   Honey Thick Honey Thick Liquid: Not tested   Puree Puree: Not tested   Solid  Rolena Infante, MS [facility] SLP Acute Rehab Services Office [phone]    Solid: Not tested      Chales Abrahams 03/16/2023,11:34 AM

## 2023-03-16 NOTE — Consult Note (Addendum)
NAME:  Kathy Thomas, MRN:  [id number], DOB:  [birth year], LOS: 2 ADMISSION DATE:  03/14/2023, CONSULTATION DATE:  10/22 REFERRING MD:  Dr. Rhona Leavens, CHIEF COMPLAINT: Cavitary pneumonia   History of Present Illness:  21F with PMH as below, which is significant for DM, HTN, dementia, and stroke. She resides in SNF where she was started on ertapenem for pneumonia after developing hypoxia and shortness of breath.  She did not have immediate improvement.  On 10/20 in the a.m. hours she was noted to be unresponsive with oxygen saturations in the 60s.  This was ultimately felt to be secondary to hypoglycemia and her responsiveness did somewhat improve with treatment.  She was brought to the emergency department for ongoing evaluation.  Workup for hypoxia included a CT scan of the chest which demonstrated cavitary lesion in the right middle lobe.  There was concern for PE versus atypical pneumonia.  VQ scan was done and was concerning for pulmonary embolism.  She was initiated on treatment with empiric antibiotics as well as systemic anticoagulation.  PCCM was consulted for cavitary pneumonia.  Pertinent  Medical History   has a past medical history of Acute sinusitis, unspecified (05/28/2008), ANEMIA-IRON DEFICIENCY (10/26/2008), ANXIETY (10/06/2007), CHOLELITHIASIS (10/06/2007), COLONIC POLYPS, HX OF (01/26/2007), DIABETES MELLITUS, TYPE II (10/06/2007), DIVERTICULOSIS, COLON (01/26/2007), ECCHYMOSES, SPONTANEOUS (06/04/2010), GERD (01/26/2007), Heart murmur, HERNIATED DISC (01/26/2007), HYPERLIPIDEMIA (10/06/2007), HYPERTENSION (01/26/2007), LOW BACK PAIN (01/29/2007), MENOPAUSAL DISORDER (10/26/2008), OSTEOPOROSIS (06/04/2010), SHOULDER PAIN, LEFT (10/06/2007), and Stroke (HCC).   Significant Hospital Events: Including procedures, antibiotic start and stop dates in addition to other pertinent events   10/20 admit for pneumonia 10/21 VQ + for PE 10/22 PCCM consult  Interim History / Subjective:    Objective   Blood pressure  (!) 146/48, pulse 70, temperature 98.6 F (37 C), temperature source Oral, resp. rate 18, height 5\' 1"  (1.549 m), weight 116.6 kg, SpO2 94%.        Intake/Output Summary (Last 24 hours) at 03/16/2023 1109 Last data filed at 03/16/2023 0865 Gross per 24 hour  Intake 297 ml  Output --  Net 297 ml   Filed Weights   03/14/23 1803  Weight: 116.6 kg    Examination: General: Obese elderly female in NAD HENT: [place]/AT, PERRL, no JVD Lungs: Coarse R base Cardiovascular: RRR, no MRG Abdomen: Soft, NT, ND Extremities: No acute deformity or edema.  Neuro: Somnolent. Arouses to verbal stimuli. Nods/shakes head yes/no.  Does not answer questions verbally.   Echo: LVEF 55-60%, Grade 1 DD CT chest: large mass like consolidation RML with cavitation. Pulmonary infarct vs less likely infection per rad read. Small right pleural effusion.  VQ scan: positive for pulmonary embolism with multiple areas of perfusion defect.   Resolved Hospital Problem list     Assessment & Plan:    Cavitary lesion: radiologist read concerning for pulmonary infarct from PE or atypical pneumonia. Daughter describes possible aspiration. V/Q c/w PE as well.   - Agree with empiric antibiotics. Should cover anaerobes as well. PCN allergy but looks like she tolerated carbapenem at SNF. Discussed with pharmacy team, will broaden to ceftriaxone and flagyl.  - 4-6 weeks of antibiotics and re-image - Sputum culture if able - Swallow eval when more alert  Pulmonary embolism: patient sits in wheelchair all day at facility per daughter.  DVT L popliteal: - Anticoagulation with heparin infusion while NPO - May need lifelong anticoagulation at this point.   Pleural effusion on the right: small - Will discuss if this is  something we should pursue diagnostic drainage of.   Goals of care:  - Discussed with daughter. Patient has documentation stating DNR/DNI. Daughter planning to bring the papers in. Would like her to remain  Full code at this time.   Per primary: AKI HTN Dementia Hypoglycemia.     Best Practice (right click and "Reselect all SmartList Selections" daily)   Per primary  Labs   CBC: Recent Labs  Lab 03/14/23 1350 03/15/23 0338 03/16/23 0419  WBC 13.9* 8.2 8.7  NEUTROABS 12.3*  --   --   HGB 12.1 12.9 11.7*  HCT 42.7 45.2 42.0  MCV 90.5 88.5 91.1  PLT 220 206 211    Basic Metabolic Panel: Recent Labs  Lab 03/14/23 1457 03/15/23 0338 03/16/23 0419  NA 135 133* 134*  K 5.1 5.4* 5.6*  CL 96* 94* 99  CO2 30 30 25   GLUCOSE 148* 54* 65*  BUN 54* 54* 43*  CREATININE 2.28* 1.98* 1.18*  CALCIUM 7.8* 8.0* 8.0*   GFR: Estimated Creatinine Clearance: 41.4 mL/min (A) (by C-G formula based on SCr of 1.18 mg/dL (H)). Recent Labs  Lab 03/14/23 1350 03/14/23 1424 03/14/23 1633 03/15/23 0338 03/16/23 0419  WBC 13.9*  --   --  8.2 8.7  LATICACIDVEN  --  1.5 0.9  --   --     Liver Function Tests: Recent Labs  Lab 03/14/23 1457 03/16/23 0419  AST 14* 10*  ALT 15 12  ALKPHOS 64 55  BILITOT 0.5 0.6  PROT 6.5 6.2*  ALBUMIN 2.1* 2.0*   No results for input(s): "LIPASE", "AMYLASE" in the last 168 hours. No results for input(s): "AMMONIA" in the last 168 hours.  ABG    Component Value Date/Time   PHART 7.31 (L) 03/14/2023 1457   PCO2ART 66 (HH) 03/14/2023 1457   PO2ART 77 (L) 03/14/2023 1457   HCO3 33.5 (H) 03/14/2023 1457   TCO2 27 07/05/2014 0155   O2SAT 96.8 03/14/2023 1457     Coagulation Profile: Recent Labs  Lab 03/14/23 1350  INR 1.1    Cardiac Enzymes: No results for input(s): "CKTOTAL", "CKMB", "CKMBINDEX", "TROPONINI" in the last 168 hours.  HbA1C: Hgb A1c MFr Bld  Date/Time Value Ref Range Status  03/15/2023 08:26 AM 7.1 (H) 4.8 - 5.6 % Final    Comment:    (NOTE) Pre diabetes:          5.7%-6.4%  Diabetes:              >6.4%  Glycemic control for   <7.0% adults with diabetes   04/02/2021 03:55 PM 6.9 (H) 4.6 - 6.5 % Final    Comment:     Glycemic Control Guidelines for People with Diabetes:Non Diabetic:  <6%Goal of Therapy: <7%Additional Action Suggested:  >8%     CBG: Recent Labs  Lab 03/15/23 1131 03/15/23 1633 03/15/23 2002 03/16/23 0722 03/16/23 1027  GLUCAP 157* 119* 130* 83 186*    Review of Systems:   Patient is encephalopathic and/or intubated; therefore, history has been obtained from chart review.    Past Medical History:  She,  has a past medical history of Acute sinusitis, unspecified (05/28/2008), ANEMIA-IRON DEFICIENCY (10/26/2008), ANXIETY (10/06/2007), CHOLELITHIASIS (10/06/2007), COLONIC POLYPS, HX OF (01/26/2007), DIABETES MELLITUS, TYPE II (10/06/2007), DIVERTICULOSIS, COLON (01/26/2007), ECCHYMOSES, SPONTANEOUS (06/04/2010), GERD (01/26/2007), Heart murmur, HERNIATED DISC (01/26/2007), HYPERLIPIDEMIA (10/06/2007), HYPERTENSION (01/26/2007), LOW BACK PAIN (01/29/2007), MENOPAUSAL DISORDER (10/26/2008), OSTEOPOROSIS (06/04/2010), SHOULDER PAIN, LEFT (10/06/2007), and Stroke (HCC).   Surgical History:   Past Surgical  History:  Procedure Laterality Date   ABDOMINAL HYSTERECTOMY     APPENDECTOMY     Fracture left  wrist  2010   Dr. Amanda Pea   Herniated disk   1995   C-spine   Left ankle-screw placement  1980   Left elbow-nerve impingement  1985   TONSILLECTOMY       Social History:   reports that she quit smoking about 9 years ago. Her smoking use included cigarettes. She has never used smokeless tobacco. She reports that she does not drink alcohol and does not use drugs.   Family History:  Her family history includes Heart disease in her father; Hyperlipidemia in an other family member; Hypertension in an other family member; Stroke in an other family member. There is no history of Esophageal cancer, Colon polyps, Rectal cancer, or Stomach cancer.   Allergies Allergies  Allergen Reactions   Codeine Hives   Penicillins     REACTION: rash   Morphine Rash    REACTION: pt unsure of reaction     Home  Medications  Prior to Admission medications   Medication Sig Start Date End Date Taking? Authorizing Provider  acetaminophen (TYLENOL) 500 MG tablet Take 1,000 mg by mouth every 6 (six) hours as needed for mild pain or headache.   Yes [provider]  ASPIRIN 81 PO Take 1 tablet by mouth daily.   Yes [provider]  Cholecalciferol (VITAMIN D3 PO) Take 1 capsule by mouth daily.   Yes [provider]  coal tar (NEUTROGENA T-GEL) 0.5 % shampoo Apply 1 Application topically as needed. Monday and thursdays   Yes [provider]  donepezil (ARICEPT) 10 MG tablet TAKE ONE TABLET BY MOUTH EVERYDAY AT BEDTIME 03/17/21  Yes [name], MD  glimepiride (AMARYL) 2 MG tablet Take 2 mg by mouth daily.   Yes [provider]  JARDIANCE 25 MG TABS tablet Take 25 mg by mouth daily.   Yes [provider]  ketoconazole (NIZORAL) 200 MG tablet Take 200 mg by mouth daily.   Yes [provider]  losartan (COZAAR) 50 MG tablet Take 1 tablet (50 mg total) by mouth daily. 04/04/21  Yes [name], MD  meloxicam (MOBIC) 7.5 MG tablet TAKE ONE TABLET BY MOUTH AT NOON 05/21/21  Yes [name], MD  NOVOLOG FLEXPEN 100 UNIT/ML FlexPen Inject 0-12 Units into the skin in the morning and at bedtime. Sliding 0-200 =0 units 201-250=4units 251-300=6units 301-350=8units 351-400=10 units 401-550=12 units 10/09/22  Yes [provider]  psyllium (REGULOID) 0.52 g capsule Take 0.52 g by mouth daily.   Yes [provider]  vitamin B-12 1000 MCG tablet Take 1 tablet (1,000 mcg total) by mouth daily. 07/08/14  Yes Conley Canal, MD  alendronate (FOSAMAX) 70 MG tablet TAKE ONE TABLET BY MOUTH ONCE WEEKLY BEFORE BREAKFAST ON MONDAY 01/21/21   [name], MD  furosemide (LASIX) 40 MG tablet TAKE ONE TABLET BY MOUTH daily 06/06/21   [name], MD     Critical care time:      Joneen Roach, AGACNP-BC [facility]  See  Amion for personal pager PCCM on call pager [phone] until 7pm. Please call Elink 7p-7a. [phone]  03/16/2023 12:20 PM

## 2023-03-16 NOTE — [place] (Signed)
[place] MEDICAID [id number] LEVEL OF CARE FORM     IDENTIFICATION  Patient Name: Kathy Thomas Birthdate: [birth year] Sex: female Admission Date (Current Location): 03/14/2023  [facility] and [place] Number:  Producer, television/film/video and Address:  [facility],  [address], [place] [address]      Provider Number: [id number]  Attending Physician Name and Address:  Jerald Kief, MD  Relative Name and Phone Number:  [name] (Daughter)  [phone] (Mobile)    Current Level of Care: SNF Recommended Level of Care: Skilled Nursing Facility (Long Term Care) Prior Approval Number:    Date Approved/Denied:   PASRR Number: [id number]  Discharge Plan: SNF (Long Term Care)    Current Diagnoses: Patient Active Problem List   Diagnosis Date Noted   Community acquired pneumonia 03/14/2023   Aortic atherosclerosis (HCC) 08/14/2020   Encounter for well adult exam with abnormal findings 08/14/2020   Submandibular gland infection 03/15/2020   Pneumonia due to COVID-19 virus 02/01/2020   Acute respiratory failure with hypoxia (HCC) 02/01/2020   Protein calorie malnutrition (HCC) 02/01/2020   Thrombocytopenia (HCC) 02/01/2020   Encounter for examination for admission to assisted living facility 10/20/2019   Pain due to onychomycosis of toenails of both feet 12/20/2018   Diabetes mellitus without complication (HCC) 12/20/2018   Diarrhea 03/07/2018   Acute right ankle pain 04/08/2017   Weight loss 06/25/2016   Iron deficiency 12/30/2015   Generalized weakness 07/08/2014   Diastolic CHF (HCC) 07/07/2014   Vitamin B12 deficiency 07/06/2014   Left fibular fracture 07/05/2014   Closed fibular fracture 07/05/2014   Left ankle pain 07/04/2014   Left knee pain 07/04/2014   Gait disorder 01/10/2014   Ruptured sebaceous cyst 10/16/2013   Dementia without behavioral disturbance (HCC) 09/08/2013   Left lumbar radiculopathy 10/28/2011   Grief reaction 10/28/2011   Insect  bite, infected 10/28/2011   Preventative health care 12/06/2010   Osteoporosis 06/04/2010   ECCHYMOSES, SPONTANEOUS 06/04/2010   ANEMIA-IRON DEFICIENCY 10/26/2008   MENOPAUSAL DISORDER 10/26/2008   DM type 2 (diabetes mellitus, type 2) (HCC) 10/06/2007   Hyperlipidemia 10/06/2007   ANXIETY 10/06/2007   CHOLELITHIASIS 10/06/2007   SHOULDER PAIN, LEFT 10/06/2007   LOW BACK PAIN 01/29/2007   Essential hypertension 01/26/2007   GERD 01/26/2007   DIVERTICULOSIS, COLON 01/26/2007   HERNIATED DISC 01/26/2007   History of colonic polyps 01/26/2007    Orientation RESPIRATION BLADDER Height & Weight     Self  O2 Incontinent, External catheter Weight: 116.6 kg Height:  5\' 1"  (154.9 cm)  BEHAVIORAL SYMPTOMS/MOOD NEUROLOGICAL BOWEL NUTRITION STATUS      Incontinent Diet (dysphagia 1, fluid thin liquids)  AMBULATORY STATUS COMMUNICATION OF NEEDS Skin   Extensive Assist Verbally PU Stage and Appropriate Care   PU Stage 2 Dressing:  (Stage 2 pressure injury buttocks, foam dressing change prn)                   Personal Care Assistance Level of Assistance  Total care Bathing Assistance: Maximum assistance Feeding assistance: Maximum assistance Dressing Assistance: Maximum assistance Total Care Assistance: Maximum assistance   Functional Limitations Info  Sight, Hearing, Speech Sight Info: Impaired Hearing Info: Adequate Speech Info: Adequate    SPECIAL CARE FACTORS FREQUENCY                       Contractures Contractures Info: Not present    Additional Factors Info  Code Status, Allergies, Psychotropic Code Status Info: Full  Code Allergies Info: Codeine, Penicillins, Morphine Psychotropic Info: N/A         Current Medications (03/16/2023):  This is the current hospital active medication list Current Facility-Administered Medications  Medication Dose Route Frequency Provider Last Rate Last Admin   acetaminophen (TYLENOL) tablet 650 mg  650 mg Oral Q6H PRN  Alan Mulder, MD       Or   acetaminophen (TYLENOL) suppository 650 mg  650 mg Rectal Q6H PRN Alan Mulder, MD       albuterol (PROVENTIL) (2.5 MG/3ML) 0.083% nebulizer solution 2.5 mg  2.5 mg Nebulization BID Jerald Kief, MD       ceFEPIme (MAXIPIME) 2 g in sodium chloride 0.9 % 100 mL IVPB  2 g Intravenous Q12H Pham, Anh P, RPH 200 mL/hr at 03/16/23 0928 2 g at 03/16/23 0928   dextrose 5 %-0.9 % sodium chloride infusion   Intravenous Continuous Jerald Kief, MD       donepezil (ARICEPT) tablet 10 mg  10 mg Oral Florence Canner, MD   10 mg at 03/15/23 2156   Gerhardt's butt cream   Topical BID Jerald Kief, MD   Given at 03/15/23 2157   heparin ADULT infusion 100 units/mL (25000 units/270mL)  1,500 Units/hr Intravenous Continuous Phylliss Blakes, RPH 15 mL/hr at 03/16/23 0603 1,500 Units/hr at 03/16/23 0603   hydrALAZINE (APRESOLINE) injection 10 mg  10 mg Intravenous Q4H PRN Jerald Kief, MD       insulin aspart (novoLOG) injection 0-5 Units  0-5 Units Subcutaneous QHS Jerald Kief, MD       insulin aspart (novoLOG) injection 0-9 Units  0-9 Units Subcutaneous TID WC Jerald Kief, MD   2 Units at 03/15/23 1202   magic mouthwash  5 mL Oral TID PRN Jerald Kief, MD       ondansetron [facility]) tablet 4 mg  4 mg Oral Q6H PRN Alan Mulder, MD       Or   ondansetron (ZOFRAN) injection 4 mg  4 mg Intravenous Q6H PRN Alan Mulder, MD       Oral care mouth rinse  15 mL Mouth Rinse PRN Jerald Kief, MD       oxyCODONE (Oxy IR/ROXICODONE) immediate release tablet 5 mg  5 mg Oral Q4H PRN Dorrell, Robert, MD       sodium zirconium cyclosilicate (LOKELMA) packet 10 g  10 g Oral BID Jerald Kief, MD   10 g at 03/16/23 0920     Discharge Medications: Please see discharge summary for a list of discharge medications.  Relevant Imaging Results:  Relevant Lab Results:   Additional Information SSN: [id number]  Howell Rucks, RN

## 2023-03-16 NOTE — Progress Notes (Signed)
   03/16/23 2108  Assess: MEWS Score  Temp (!) 101.1 F (38.4 C)  BP (!) 168/64  MAP (mmHg) 86  Pulse Rate 69  Resp (!) 21  SpO2 (!) 89 %  Assess: MEWS Score  MEWS Temp 1  MEWS Systolic 0  MEWS Pulse 0  MEWS RR 1  MEWS LOC 1  MEWS Score 3  MEWS Score Color Yellow  Assess: if the MEWS score is Yellow or Red  Were vital signs accurate and taken at a resting state? Yes  Does the patient meet 2 or more of the SIRS criteria? Yes  Does the patient have a confirmed or suspected source of infection? Yes  MEWS guidelines implemented  Yes, yellow  Treat  MEWS Interventions Considered administering scheduled or prn medications/treatments as ordered  Take Vital Signs  Increase Vital Sign Frequency  Yellow: Q2hr x1, continue Q4hrs until patient remains green for 12hrs  Escalate  MEWS: Escalate Yellow: Discuss with charge nurse and consider notifying provider and/or RRT  Notify: Charge Nurse/RN  Name of Charge Nurse/RN Notified Mardene Celeste, RN  Provider Notification  Provider Name/Title Art Buff, NP  Date Provider Notified 03/16/23  Time Provider Notified 2125  Method of Notification Page (secure chat)  Notification Reason Other (Comment) (fever and increased blood pressure)  Provider response See new orders  Date of Provider Response 03/16/23  Time of Provider Response 2026  Assess: SIRS CRITERIA  SIRS Temperature  1  SIRS Pulse 0  SIRS Respirations  1  SIRS WBC 0  SIRS Score Sum  2   Patient is drowsy. PRN tylenol and hydralazine given. Provider made aware. Will continue to monitor patient.

## 2023-03-17 ENCOUNTER — Inpatient Hospital Stay (HOSPITAL_COMMUNITY): Payer: Medicare HMO

## 2023-03-17 ENCOUNTER — Other Ambulatory Visit (HOSPITAL_COMMUNITY): Payer: Self-pay

## 2023-03-17 DIAGNOSIS — F0390 Unspecified dementia without behavioral disturbance: Secondary | ICD-10-CM | POA: Diagnosis not present

## 2023-03-17 DIAGNOSIS — L89302 Pressure ulcer of unspecified buttock, stage 2: Secondary | ICD-10-CM

## 2023-03-17 DIAGNOSIS — J984 Other disorders of lung: Secondary | ICD-10-CM | POA: Diagnosis not present

## 2023-03-17 DIAGNOSIS — L8990 Pressure ulcer of unspecified site, unspecified stage: Secondary | ICD-10-CM | POA: Insufficient documentation

## 2023-03-17 DIAGNOSIS — I2699 Other pulmonary embolism without acute cor pulmonale: Secondary | ICD-10-CM | POA: Diagnosis not present

## 2023-03-17 DIAGNOSIS — N179 Acute kidney failure, unspecified: Secondary | ICD-10-CM

## 2023-03-17 DIAGNOSIS — I10 Essential (primary) hypertension: Secondary | ICD-10-CM

## 2023-03-17 DIAGNOSIS — E1169 Type 2 diabetes mellitus with other specified complication: Secondary | ICD-10-CM

## 2023-03-17 DIAGNOSIS — I82432 Acute embolism and thrombosis of left popliteal vein: Secondary | ICD-10-CM

## 2023-03-17 DIAGNOSIS — E875 Hyperkalemia: Secondary | ICD-10-CM | POA: Diagnosis not present

## 2023-03-17 DIAGNOSIS — B370 Candidal stomatitis: Secondary | ICD-10-CM | POA: Diagnosis present

## 2023-03-17 DIAGNOSIS — I82409 Acute embolism and thrombosis of unspecified deep veins of unspecified lower extremity: Secondary | ICD-10-CM | POA: Diagnosis present

## 2023-03-17 DIAGNOSIS — J189 Pneumonia, unspecified organism: Secondary | ICD-10-CM | POA: Diagnosis not present

## 2023-03-17 LAB — URINALYSIS, COMPLETE (UACMP) WITH MICROSCOPIC
Bilirubin Urine: NEGATIVE
Glucose, UA: >=500 mg/dL — AB
Hgb urine dipstick: NEGATIVE
Ketones, ur: NEGATIVE mg/dL
Nitrite: NEGATIVE
Protein, ur: NEGATIVE mg/dL
Specific Gravity, Urine: 1.017 (ref 1.005–1.030)
pH: 5 (ref 5.0–8.0)

## 2023-03-17 LAB — BLOOD GAS, ARTERIAL
Acid-Base Excess: 4 mmol/L — ABNORMAL HIGH (ref 0.0–2.0)
Bicarbonate: 32.2 mmol/L — ABNORMAL HIGH (ref 20.0–28.0)
Drawn by: 213381
O2 Content: 4 L/min
O2 Saturation: 95 %
Patient temperature: 37.7
pCO2 arterial: 66 mm[Hg] (ref 32–48)
pH, Arterial: 7.3 — ABNORMAL LOW (ref 7.35–7.45)
pO2, Arterial: 73 mm[Hg] — ABNORMAL LOW (ref 83–108)

## 2023-03-17 LAB — GLUCOSE, CAPILLARY
Glucose-Capillary: 127 mg/dL — ABNORMAL HIGH (ref 70–99)
Glucose-Capillary: 183 mg/dL — ABNORMAL HIGH (ref 70–99)
Glucose-Capillary: 181 mg/dL — ABNORMAL HIGH (ref 70–99)
Glucose-Capillary: 211 mg/dL — ABNORMAL HIGH (ref 70–99)

## 2023-03-17 LAB — BLOOD GAS, VENOUS
Acid-Base Excess: 6 mmol/L — ABNORMAL HIGH (ref 0.0–2.0)
Bicarbonate: 34 mmol/L — ABNORMAL HIGH (ref 20.0–28.0)
O2 Saturation: 92.1 %
Patient temperature: 36.7
pCO2, Ven: 65 mm[Hg] — ABNORMAL HIGH (ref 44–60)
pH, Ven: 7.32 (ref 7.25–7.43)
pO2, Ven: 61 mm[Hg] — ABNORMAL HIGH (ref 32–45)

## 2023-03-17 LAB — CBC
HCT: 47.7 % — ABNORMAL HIGH (ref 36.0–46.0)
Hemoglobin: 13.2 g/dL (ref 12.0–15.0)
MCH: 25.3 pg — ABNORMAL LOW (ref 26.0–34.0)
MCHC: 27.7 g/dL — ABNORMAL LOW (ref 30.0–36.0)
MCV: 91.4 fL (ref 80.0–100.0)
Platelets: 183 10*3/uL (ref 150–400)
RBC: 5.22 MIL/uL — ABNORMAL HIGH (ref 3.87–5.11)
RDW: 16.8 % — ABNORMAL HIGH (ref 11.5–15.5)
WBC: 7.8 10*3/uL (ref 4.0–10.5)
nRBC: 0 % (ref 0.0–0.2)

## 2023-03-17 LAB — COMPREHENSIVE METABOLIC PANEL
ALT: 13 U/L (ref 0–44)
AST: 12 U/L — ABNORMAL LOW (ref 15–41)
Albumin: 2.2 g/dL — ABNORMAL LOW (ref 3.5–5.0)
Alkaline Phosphatase: 55 U/L (ref 38–126)
Anion gap: 8 (ref 5–15)
BUN: 37 mg/dL — ABNORMAL HIGH (ref 8–23)
CO2: 26 mmol/L (ref 22–32)
Calcium: 8.1 mg/dL — ABNORMAL LOW (ref 8.9–10.3)
Chloride: 104 mmol/L (ref 98–111)
Creatinine, Ser: 0.99 mg/dL (ref 0.44–1.00)
GFR, Estimated: 56 mL/min — ABNORMAL LOW (ref >60)
Glucose, Bld: 114 mg/dL — ABNORMAL HIGH (ref 70–99)
Potassium: 5.3 mmol/L — ABNORMAL HIGH (ref 3.5–5.1)
Sodium: 138 mmol/L (ref 135–145)
Total Bilirubin: 0.4 mg/dL (ref 0.3–1.2)
Total Protein: 6.7 g/dL (ref 6.5–8.1)

## 2023-03-17 LAB — HEPARIN LEVEL (UNFRACTIONATED): Heparin Unfractionated: 0.65 [IU]/mL (ref 0.30–0.70)

## 2023-03-17 LAB — MAGNESIUM: Magnesium: 2.7 mg/dL — ABNORMAL HIGH (ref 1.7–2.4)

## 2023-03-17 LAB — AMMONIA: Ammonia: 41 umol/L — ABNORMAL HIGH (ref 9–35)

## 2023-03-17 MED ORDER — LACTULOSE 10 GM/15ML PO SOLN
20.0000 g | Freq: Two times a day (BID) | ORAL | Status: DC
  Administered 2023-03-17 – 2023-03-21 (×8): 20 g via ORAL
  Filled 2023-03-17 (×9): qty 30

## 2023-03-17 MED ORDER — SODIUM ZIRCONIUM CYCLOSILICATE 10 G PO PACK
10.0000 g | PACK | Freq: Two times a day (BID) | ORAL | Status: AC
  Administered 2023-03-17 (×2): 10 g via ORAL
  Filled 2023-03-17 (×2): qty 1

## 2023-03-17 MED ORDER — CHLORHEXIDINE GLUCONATE CLOTH 2 % EX PADS
6.0000 | MEDICATED_PAD | Freq: Every day | CUTANEOUS | Status: DC
  Administered 2023-03-17 – 2023-03-26 (×9): 6 via TOPICAL

## 2023-03-17 MED ORDER — METOPROLOL TARTRATE 5 MG/5ML IV SOLN
5.0000 mg | Freq: Three times a day (TID) | INTRAVENOUS | Status: DC
  Administered 2023-03-17: 5 mg via INTRAVENOUS
  Filled 2023-03-17: qty 5

## 2023-03-17 MED ORDER — PANTOPRAZOLE SODIUM 40 MG IV SOLR
40.0000 mg | INTRAVENOUS | Status: DC
  Administered 2023-03-17 – 2023-03-21 (×5): 40 mg via INTRAVENOUS
  Filled 2023-03-17 (×5): qty 10

## 2023-03-17 MED ORDER — DEXTROSE-SODIUM CHLORIDE 5-0.9 % IV SOLN: INTRAVENOUS | Status: AC

## 2023-03-17 MED ORDER — LACTULOSE ENEMA
300.0000 mL | Freq: Once | ORAL | Status: DC
  Filled 2023-03-17: qty 300

## 2023-03-17 MED ORDER — IPRATROPIUM-ALBUTEROL 0.5-2.5 (3) MG/3ML IN SOLN
3.0000 mL | Freq: Four times a day (QID) | RESPIRATORY_TRACT | Status: DC
  Administered 2023-03-17 – 2023-03-21 (×19): 3 mL via RESPIRATORY_TRACT
  Filled 2023-03-17 (×19): qty 3

## 2023-03-17 MED ORDER — FLUTICASONE PROPIONATE 50 MCG/ACT NA SUSP
2.0000 | Freq: Every day | NASAL | Status: DC
  Administered 2023-03-17 – 2023-04-02 (×16): 2 via NASAL
  Filled 2023-03-17: qty 16

## 2023-03-17 MED ORDER — NYSTATIN 100000 UNIT/ML MT SUSP
5.0000 mL | Freq: Four times a day (QID) | OROMUCOSAL | Status: DC
  Administered 2023-03-18 – 2023-04-02 (×54): 500000 [IU] via ORAL
  Filled 2023-03-17 (×50): qty 5

## 2023-03-17 MED ORDER — METHYLPREDNISOLONE SODIUM SUCC 40 MG IJ SOLR
40.0000 mg | Freq: Two times a day (BID) | INTRAMUSCULAR | Status: DC
Start: 2023-03-17 — End: 2023-03-19
  Administered 2023-03-17 – 2023-03-18 (×4): 40 mg via INTRAVENOUS
  Filled 2023-03-17 (×4): qty 1

## 2023-03-17 MED ORDER — ORAL CARE MOUTH RINSE: 15.0000 mL | OROMUCOSAL | Status: DC | PRN

## 2023-03-17 MED ORDER — CYANOCOBALAMIN 1000 MCG/ML IJ SOLN
1000.0000 ug | Freq: Every day | INTRAMUSCULAR | Status: DC
  Administered 2023-03-18 – 2023-03-29 (×12): 1000 ug via SUBCUTANEOUS
  Filled 2023-03-17 (×12): qty 1

## 2023-03-17 MED ORDER — ORAL CARE MOUTH RINSE
15.0000 mL | OROMUCOSAL | Status: DC
  Administered 2023-03-17 – 2023-04-02 (×62): 15 mL via OROMUCOSAL

## 2023-03-17 MED ORDER — LORATADINE 10 MG PO TABS
10.0000 mg | ORAL_TABLET | Freq: Every day | ORAL | Status: DC
  Administered 2023-03-17 – 2023-04-02 (×17): 10 mg via ORAL
  Filled 2023-03-17 (×17): qty 1

## 2023-03-17 MED ORDER — IPRATROPIUM-ALBUTEROL 0.5-2.5 (3) MG/3ML IN SOLN: 3.0000 mL | RESPIRATORY_TRACT | Status: DC | PRN

## 2023-03-17 MED ORDER — BUDESONIDE 0.5 MG/2ML IN SUSP
0.5000 mg | Freq: Two times a day (BID) | RESPIRATORY_TRACT | Status: DC
  Administered 2023-03-17 – 2023-04-02 (×33): 0.5 mg via RESPIRATORY_TRACT
  Filled 2023-03-17 (×33): qty 2

## 2023-03-17 NOTE — Progress Notes (Signed)
PT Cancellation Note  Patient Details Name: Kathy Thomas MRN: [id number] DOB: 11/01/1934   Cancelled Treatment:    Reason Eval/Treat Not Completed: Medical issues which prohibited therapy--pt currently on bipap. will check back another time/day as schedule allows.    Faye Ramsay, PT Acute Rehabilitation  Office: [phone]

## 2023-03-17 NOTE — Progress Notes (Signed)
Yes                                      +---------+---------------+---------+-----------+----------+-------------------+ FV Distal               Yes      Yes                                      +---------+---------------+---------+-----------+----------+-------------------+ PFV                                                   Not well visualized +---------+---------------+---------+-----------+----------+-------------------+ POP      None           No       No                   Acute                +---------+---------------+---------+-----------+----------+-------------------+ PTV      Full                                                             +---------+---------------+---------+-----------+----------+-------------------+ PERO                                                  Not well visualized +---------+---------------+---------+-----------+----------+-------------------+     Summary: RIGHT: - There is no evidence of deep vein thrombosis in the lower extremity. However, portions of this examination were limited- see technologist comments above.  - No cystic structure found in the popliteal fossa.  LEFT: - Findings consistent with acute deep vein thrombosis involving the left popliteal vein.  - No cystic structure found in the popliteal fossa.  *See table(s) above for measurements and observations. Electronically signed by Lemar Livings MD on 03/16/2023 at 4:36:50 PM.    Final    VAS [place] UPPER EXTREMITY VENOUS DUPLEX  Result Date: 03/16/2023 UPPER VENOUS STUDY  Patient Name:  YORLENI HIBDON  Date of Exam:   03/16/2023 Medical Rec #: [id number]        Accession #:    [id number] Date of Birth: [birth year]       Patient Gender: F Patient Age:   88 years Exam Location:  [facility] Procedure:      VAS [place] UPPER EXTREMITY VENOUS DUPLEX Referring Phys: STEPHEN CHIU --------------------------------------------------------------------------------  Indications: Swelling Risk Factors: Confirmed PE. Anticoagulation: Heparin. Limitations: Poor ultrasound/tissue interface, body habitus and patient positioning, patient movement, poor patient cooperation. Comparison Study: No prior studies. Performing Technologist: Chanda Busing RVT  Examination Guidelines: A complete evaluation includes B-mode imaging, spectral Doppler, color Doppler, and power Doppler as needed of all accessible portions of each vessel. Bilateral testing is considered an integral part of a complete examination. Limited  examinations for reoccurring indications may be performed as noted.  Right Findings: +----------+------------+---------+-----------+----------+-------+ RIGHT  PROGRESS NOTE    SABIAN SPROW  NAT:[id number] DOB: 1935-01-22 DOA: 03/14/2023 PCP: Marden Noble, MD (Inactive)   Chief Complaint  Patient presents with   Hypoglycemia   Shortness of Breath    Brief Narrative:  88 y.o. female with medical history significant of dementia, hypertension who presented to the emergency department due to shortness of breath.  Patient was found to be hypoxic 3 days ago and underwent chest x-ray which showed concern for community-acquired pneumonia.  She was started on ertapenem and received 3 doses.  By the nursing facility she developed tachypnea and shortness of breath so EMS was called to bring her to the emergency department further assessment.  On EMS arrival she was found to be hypoglycemic.  She was brought to the ER where she was hypoxic requiring up to 6 L nasal cannula. Pt was admitted for concerns of cavitary PNA    Assessment & Plan:   Principal Problem:   Cavitary pneumonia Active Problems:   Pulmonary embolism (HCC)   DVT (deep venous thrombosis) (HCC)   DM type 2 (diabetes mellitus, type 2) (HCC)   HTN (hypertension)   Dementia without behavioral disturbance (HCC)   Diabetes mellitus without complication (HCC)   Pressure injury of skin   Hyperkalemia   AKI (acute kidney injury) (HCC)   Oral thrush   #1 cavitary pneumonia -Patient noted to have presented with altered mental status, tachypnea, worsening shortness of breath and noted to have been hypoxic 3 days prior to admission underwent chest x-ray concerning for community-acquired pneumonia. -On arrival to the ED patient noted to be hypoxic requiring up to 6 L nasal cannula and admitted for concerns for cavitary pneumonia. -CT chest done with findings notable for a large masslike consolidation of the RML with central groundglass, cavitation, peripheral ring of dense consolidation concerning for pulmonary infarct versus atypical cavitary pneumonia.  Small right pleural effusion  noted. -Patient noted to have completed ertapenem prior to admission. -Patient somewhat drowsy/lethargic noted overnight and also this morning. -Patient on 4 to 5 L nasal cannula with sats of 96%. -Patient seen in consultation by pulmonary who assessed the patient and recommending continuation of antibiotics of IV Rocephin and Flagyl and recommended total of 4 to 6 weeks of antibiotics await reimaging in the outpatient setting. -Patient assessed by SLP. -Supportive care.  2.  PE/left popliteal DVT -Noted on V/Q scan with findings confirming PE. -Lower extremity Dopplers with left lower extremity DVT. -2D echo with EF of 55 to 60%, mild concentric LVH, grade 1 DD, full study not completed as patient noted to have refused exam. -Continue heparin drip and once more alert and clinically improved could likely change to a DOAC. -Per pulmonary likely need lifelong DOAC.  3.  Acute metabolic encephalopathy -Likely secondary to acute illness of cavitary pneumonia, concerns for hypercarbia with prior history of tobacco use. -Patient with no focal neurological deficits however daughter concerned due to patient's family history and current mental status concern for possible CVA. -CT head done on admission negative for any acute abnormalities. -Repeat head CT. -Ammonia levels slightly elevated at 41. -ABG with a pH of 7.3/pCO2 of 66/pO2 of 73/bicarb of 32. -Placed on lactulose 20 g p.o. twice daily. -Continue treatment as in problem #1 and 2. -Trial of BiPAP. -Placed on Pulmicort, Flonase, scheduled DuoNebs, Claritin, Solu-Medrol 40 mg IV every 12 hours, IV PPI.  4.  Hyperkalemia - Lokelma 10 g p.o. twice daily x 1 day  5.  AKI -Likely secondary  PENDING  Incomplete  Culture, blood (routine x 2)     Status: None (Preliminary result)   Collection Time: 03/14/23  2:57 PM   Specimen: BLOOD  Result Value Ref Range Status   Specimen Description   Final    BLOOD BLOOD RIGHT ARM AEROBIC BOTTLE ONLY ANAEROBIC BOTTLE ONLY Performed at [facility], [address] [place], [place] [address]    Special Requests   Final    BOTTLES DRAWN AEROBIC AND ANAEROBIC Blood Culture adequate volume Performed at [facility], [address] [place], [place] [address]    Culture   Final    NO GROWTH 3 DAYS Performed at [facility], [address], [place], [place] [address]    Report Status PENDING  Incomplete  MRSA Next Gen by PCR, Nasal     Status: None   Collection Time: 03/14/23 10:15 PM   Specimen: Nasal Mucosa; Nasal Swab  Result Value Ref Range Status   MRSA by PCR Next Gen NOT DETECTED NOT DETECTED Final    Comment: (NOTE) The GeneXpert MRSA Assay (FDA approved for NASAL specimens only), is one component of a comprehensive MRSA colonization surveillance program. It is not intended to diagnose MRSA infection nor to guide or monitor treatment for MRSA infections. Test performance is not FDA approved in patients less than 12 years old. Performed at [facility], [address] [place], [place] [address]          Radiology Studies: VAS [place] LOWER EXTREMITY VENOUS (DVT)  Result Date: 03/16/2023  Lower Venous DVT Study Patient Name:  TAKELIA MCBRAYER  Date of Exam:   03/16/2023 Medical Rec #: [id number]        Accession #:    [id number] Date of Birth: 11-Sep-1934       Patient Gender: F Patient Age:   10 years Exam Location:  [facility] Procedure:      VAS [place] LOWER EXTREMITY VENOUS (DVT) Referring Phys: STEPHEN CHIU  --------------------------------------------------------------------------------  Indications: Pulmonary embolism.  Risk Factors: Confirmed PE. Anticoagulation: Heparin. Limitations: Body habitus, poor ultrasound/tissue interface and patient positioning, patient movement, patient pain tolerance, poor patient cooperation. Comparison Study: No prior studies. Performing Technologist: Chanda Busing RVT  Examination Guidelines: A complete evaluation includes B-mode imaging, spectral Doppler, color Doppler, and power Doppler as needed of all accessible portions of each vessel. Bilateral testing is considered an integral part of a complete examination. Limited examinations for reoccurring indications may be performed as noted. The reflux portion of the exam is performed with the patient in reverse Trendelenburg.  +---------+---------------+---------+-----------+----------+-------------------+ RIGHT    CompressibilityPhasicitySpontaneityPropertiesThrombus Aging      +---------+---------------+---------+-----------+----------+-------------------+ CFV      Full           Yes      Yes                                      +---------+---------------+---------+-----------+----------+-------------------+ SFJ      Full                                                             +---------+---------------+---------+-----------+----------+-------------------+ FV Prox  Full                                                             +---------+---------------+---------+-----------+----------+-------------------+  PROGRESS NOTE    SABIAN SPROW  NAT:[id number] DOB: 1935-01-22 DOA: 03/14/2023 PCP: Marden Noble, MD (Inactive)   Chief Complaint  Patient presents with   Hypoglycemia   Shortness of Breath    Brief Narrative:  88 y.o. female with medical history significant of dementia, hypertension who presented to the emergency department due to shortness of breath.  Patient was found to be hypoxic 3 days ago and underwent chest x-ray which showed concern for community-acquired pneumonia.  She was started on ertapenem and received 3 doses.  By the nursing facility she developed tachypnea and shortness of breath so EMS was called to bring her to the emergency department further assessment.  On EMS arrival she was found to be hypoglycemic.  She was brought to the ER where she was hypoxic requiring up to 6 L nasal cannula. Pt was admitted for concerns of cavitary PNA    Assessment & Plan:   Principal Problem:   Cavitary pneumonia Active Problems:   Pulmonary embolism (HCC)   DVT (deep venous thrombosis) (HCC)   DM type 2 (diabetes mellitus, type 2) (HCC)   HTN (hypertension)   Dementia without behavioral disturbance (HCC)   Diabetes mellitus without complication (HCC)   Pressure injury of skin   Hyperkalemia   AKI (acute kidney injury) (HCC)   Oral thrush   #1 cavitary pneumonia -Patient noted to have presented with altered mental status, tachypnea, worsening shortness of breath and noted to have been hypoxic 3 days prior to admission underwent chest x-ray concerning for community-acquired pneumonia. -On arrival to the ED patient noted to be hypoxic requiring up to 6 L nasal cannula and admitted for concerns for cavitary pneumonia. -CT chest done with findings notable for a large masslike consolidation of the RML with central groundglass, cavitation, peripheral ring of dense consolidation concerning for pulmonary infarct versus atypical cavitary pneumonia.  Small right pleural effusion  noted. -Patient noted to have completed ertapenem prior to admission. -Patient somewhat drowsy/lethargic noted overnight and also this morning. -Patient on 4 to 5 L nasal cannula with sats of 96%. -Patient seen in consultation by pulmonary who assessed the patient and recommending continuation of antibiotics of IV Rocephin and Flagyl and recommended total of 4 to 6 weeks of antibiotics await reimaging in the outpatient setting. -Patient assessed by SLP. -Supportive care.  2.  PE/left popliteal DVT -Noted on V/Q scan with findings confirming PE. -Lower extremity Dopplers with left lower extremity DVT. -2D echo with EF of 55 to 60%, mild concentric LVH, grade 1 DD, full study not completed as patient noted to have refused exam. -Continue heparin drip and once more alert and clinically improved could likely change to a DOAC. -Per pulmonary likely need lifelong DOAC.  3.  Acute metabolic encephalopathy -Likely secondary to acute illness of cavitary pneumonia, concerns for hypercarbia with prior history of tobacco use. -Patient with no focal neurological deficits however daughter concerned due to patient's family history and current mental status concern for possible CVA. -CT head done on admission negative for any acute abnormalities. -Repeat head CT. -Ammonia levels slightly elevated at 41. -ABG with a pH of 7.3/pCO2 of 66/pO2 of 73/bicarb of 32. -Placed on lactulose 20 g p.o. twice daily. -Continue treatment as in problem #1 and 2. -Trial of BiPAP. -Placed on Pulmicort, Flonase, scheduled DuoNebs, Claritin, Solu-Medrol 40 mg IV every 12 hours, IV PPI.  4.  Hyperkalemia - Lokelma 10 g p.o. twice daily x 1 day  5.  AKI -Likely secondary  PENDING  Incomplete  Culture, blood (routine x 2)     Status: None (Preliminary result)   Collection Time: 03/14/23  2:57 PM   Specimen: BLOOD  Result Value Ref Range Status   Specimen Description   Final    BLOOD BLOOD RIGHT ARM AEROBIC BOTTLE ONLY ANAEROBIC BOTTLE ONLY Performed at [facility], [address] [place], [place] [address]    Special Requests   Final    BOTTLES DRAWN AEROBIC AND ANAEROBIC Blood Culture adequate volume Performed at [facility], [address] [place], [place] [address]    Culture   Final    NO GROWTH 3 DAYS Performed at [facility], [address], [place], [place] [address]    Report Status PENDING  Incomplete  MRSA Next Gen by PCR, Nasal     Status: None   Collection Time: 03/14/23 10:15 PM   Specimen: Nasal Mucosa; Nasal Swab  Result Value Ref Range Status   MRSA by PCR Next Gen NOT DETECTED NOT DETECTED Final    Comment: (NOTE) The GeneXpert MRSA Assay (FDA approved for NASAL specimens only), is one component of a comprehensive MRSA colonization surveillance program. It is not intended to diagnose MRSA infection nor to guide or monitor treatment for MRSA infections. Test performance is not FDA approved in patients less than 12 years old. Performed at [facility], [address] [place], [place] [address]          Radiology Studies: VAS [place] LOWER EXTREMITY VENOUS (DVT)  Result Date: 03/16/2023  Lower Venous DVT Study Patient Name:  TAKELIA MCBRAYER  Date of Exam:   03/16/2023 Medical Rec #: [id number]        Accession #:    [id number] Date of Birth: 11-Sep-1934       Patient Gender: F Patient Age:   10 years Exam Location:  [facility] Procedure:      VAS [place] LOWER EXTREMITY VENOUS (DVT) Referring Phys: STEPHEN CHIU  --------------------------------------------------------------------------------  Indications: Pulmonary embolism.  Risk Factors: Confirmed PE. Anticoagulation: Heparin. Limitations: Body habitus, poor ultrasound/tissue interface and patient positioning, patient movement, patient pain tolerance, poor patient cooperation. Comparison Study: No prior studies. Performing Technologist: Chanda Busing RVT  Examination Guidelines: A complete evaluation includes B-mode imaging, spectral Doppler, color Doppler, and power Doppler as needed of all accessible portions of each vessel. Bilateral testing is considered an integral part of a complete examination. Limited examinations for reoccurring indications may be performed as noted. The reflux portion of the exam is performed with the patient in reverse Trendelenburg.  +---------+---------------+---------+-----------+----------+-------------------+ RIGHT    CompressibilityPhasicitySpontaneityPropertiesThrombus Aging      +---------+---------------+---------+-----------+----------+-------------------+ CFV      Full           Yes      Yes                                      +---------+---------------+---------+-----------+----------+-------------------+ SFJ      Full                                                             +---------+---------------+---------+-----------+----------+-------------------+ FV Prox  Full                                                             +---------+---------------+---------+-----------+----------+-------------------+  Yes                                      +---------+---------------+---------+-----------+----------+-------------------+ FV Distal               Yes      Yes                                      +---------+---------------+---------+-----------+----------+-------------------+ PFV                                                   Not well visualized +---------+---------------+---------+-----------+----------+-------------------+ POP      None           No       No                   Acute                +---------+---------------+---------+-----------+----------+-------------------+ PTV      Full                                                             +---------+---------------+---------+-----------+----------+-------------------+ PERO                                                  Not well visualized +---------+---------------+---------+-----------+----------+-------------------+     Summary: RIGHT: - There is no evidence of deep vein thrombosis in the lower extremity. However, portions of this examination were limited- see technologist comments above.  - No cystic structure found in the popliteal fossa.  LEFT: - Findings consistent with acute deep vein thrombosis involving the left popliteal vein.  - No cystic structure found in the popliteal fossa.  *See table(s) above for measurements and observations. Electronically signed by Lemar Livings MD on 03/16/2023 at 4:36:50 PM.    Final    VAS [place] UPPER EXTREMITY VENOUS DUPLEX  Result Date: 03/16/2023 UPPER VENOUS STUDY  Patient Name:  YORLENI HIBDON  Date of Exam:   03/16/2023 Medical Rec #: [id number]        Accession #:    [id number] Date of Birth: [birth year]       Patient Gender: F Patient Age:   88 years Exam Location:  [facility] Procedure:      VAS [place] UPPER EXTREMITY VENOUS DUPLEX Referring Phys: STEPHEN CHIU --------------------------------------------------------------------------------  Indications: Swelling Risk Factors: Confirmed PE. Anticoagulation: Heparin. Limitations: Poor ultrasound/tissue interface, body habitus and patient positioning, patient movement, poor patient cooperation. Comparison Study: No prior studies. Performing Technologist: Chanda Busing RVT  Examination Guidelines: A complete evaluation includes B-mode imaging, spectral Doppler, color Doppler, and power Doppler as needed of all accessible portions of each vessel. Bilateral testing is considered an integral part of a complete examination. Limited  examinations for reoccurring indications may be performed as noted.  Right Findings: +----------+------------+---------+-----------+----------+-------+ RIGHT  PROGRESS NOTE    SABIAN SPROW  NAT:[id number] DOB: 1935-01-22 DOA: 03/14/2023 PCP: Marden Noble, MD (Inactive)   Chief Complaint  Patient presents with   Hypoglycemia   Shortness of Breath    Brief Narrative:  88 y.o. female with medical history significant of dementia, hypertension who presented to the emergency department due to shortness of breath.  Patient was found to be hypoxic 3 days ago and underwent chest x-ray which showed concern for community-acquired pneumonia.  She was started on ertapenem and received 3 doses.  By the nursing facility she developed tachypnea and shortness of breath so EMS was called to bring her to the emergency department further assessment.  On EMS arrival she was found to be hypoglycemic.  She was brought to the ER where she was hypoxic requiring up to 6 L nasal cannula. Pt was admitted for concerns of cavitary PNA    Assessment & Plan:   Principal Problem:   Cavitary pneumonia Active Problems:   Pulmonary embolism (HCC)   DVT (deep venous thrombosis) (HCC)   DM type 2 (diabetes mellitus, type 2) (HCC)   HTN (hypertension)   Dementia without behavioral disturbance (HCC)   Diabetes mellitus without complication (HCC)   Pressure injury of skin   Hyperkalemia   AKI (acute kidney injury) (HCC)   Oral thrush   #1 cavitary pneumonia -Patient noted to have presented with altered mental status, tachypnea, worsening shortness of breath and noted to have been hypoxic 3 days prior to admission underwent chest x-ray concerning for community-acquired pneumonia. -On arrival to the ED patient noted to be hypoxic requiring up to 6 L nasal cannula and admitted for concerns for cavitary pneumonia. -CT chest done with findings notable for a large masslike consolidation of the RML with central groundglass, cavitation, peripheral ring of dense consolidation concerning for pulmonary infarct versus atypical cavitary pneumonia.  Small right pleural effusion  noted. -Patient noted to have completed ertapenem prior to admission. -Patient somewhat drowsy/lethargic noted overnight and also this morning. -Patient on 4 to 5 L nasal cannula with sats of 96%. -Patient seen in consultation by pulmonary who assessed the patient and recommending continuation of antibiotics of IV Rocephin and Flagyl and recommended total of 4 to 6 weeks of antibiotics await reimaging in the outpatient setting. -Patient assessed by SLP. -Supportive care.  2.  PE/left popliteal DVT -Noted on V/Q scan with findings confirming PE. -Lower extremity Dopplers with left lower extremity DVT. -2D echo with EF of 55 to 60%, mild concentric LVH, grade 1 DD, full study not completed as patient noted to have refused exam. -Continue heparin drip and once more alert and clinically improved could likely change to a DOAC. -Per pulmonary likely need lifelong DOAC.  3.  Acute metabolic encephalopathy -Likely secondary to acute illness of cavitary pneumonia, concerns for hypercarbia with prior history of tobacco use. -Patient with no focal neurological deficits however daughter concerned due to patient's family history and current mental status concern for possible CVA. -CT head done on admission negative for any acute abnormalities. -Repeat head CT. -Ammonia levels slightly elevated at 41. -ABG with a pH of 7.3/pCO2 of 66/pO2 of 73/bicarb of 32. -Placed on lactulose 20 g p.o. twice daily. -Continue treatment as in problem #1 and 2. -Trial of BiPAP. -Placed on Pulmicort, Flonase, scheduled DuoNebs, Claritin, Solu-Medrol 40 mg IV every 12 hours, IV PPI.  4.  Hyperkalemia - Lokelma 10 g p.o. twice daily x 1 day  5.  AKI -Likely secondary  PROGRESS NOTE    SABIAN SPROW  NAT:[id number] DOB: 1935-01-22 DOA: 03/14/2023 PCP: Marden Noble, MD (Inactive)   Chief Complaint  Patient presents with   Hypoglycemia   Shortness of Breath    Brief Narrative:  88 y.o. female with medical history significant of dementia, hypertension who presented to the emergency department due to shortness of breath.  Patient was found to be hypoxic 3 days ago and underwent chest x-ray which showed concern for community-acquired pneumonia.  She was started on ertapenem and received 3 doses.  By the nursing facility she developed tachypnea and shortness of breath so EMS was called to bring her to the emergency department further assessment.  On EMS arrival she was found to be hypoglycemic.  She was brought to the ER where she was hypoxic requiring up to 6 L nasal cannula. Pt was admitted for concerns of cavitary PNA    Assessment & Plan:   Principal Problem:   Cavitary pneumonia Active Problems:   Pulmonary embolism (HCC)   DVT (deep venous thrombosis) (HCC)   DM type 2 (diabetes mellitus, type 2) (HCC)   HTN (hypertension)   Dementia without behavioral disturbance (HCC)   Diabetes mellitus without complication (HCC)   Pressure injury of skin   Hyperkalemia   AKI (acute kidney injury) (HCC)   Oral thrush   #1 cavitary pneumonia -Patient noted to have presented with altered mental status, tachypnea, worsening shortness of breath and noted to have been hypoxic 3 days prior to admission underwent chest x-ray concerning for community-acquired pneumonia. -On arrival to the ED patient noted to be hypoxic requiring up to 6 L nasal cannula and admitted for concerns for cavitary pneumonia. -CT chest done with findings notable for a large masslike consolidation of the RML with central groundglass, cavitation, peripheral ring of dense consolidation concerning for pulmonary infarct versus atypical cavitary pneumonia.  Small right pleural effusion  noted. -Patient noted to have completed ertapenem prior to admission. -Patient somewhat drowsy/lethargic noted overnight and also this morning. -Patient on 4 to 5 L nasal cannula with sats of 96%. -Patient seen in consultation by pulmonary who assessed the patient and recommending continuation of antibiotics of IV Rocephin and Flagyl and recommended total of 4 to 6 weeks of antibiotics await reimaging in the outpatient setting. -Patient assessed by SLP. -Supportive care.  2.  PE/left popliteal DVT -Noted on V/Q scan with findings confirming PE. -Lower extremity Dopplers with left lower extremity DVT. -2D echo with EF of 55 to 60%, mild concentric LVH, grade 1 DD, full study not completed as patient noted to have refused exam. -Continue heparin drip and once more alert and clinically improved could likely change to a DOAC. -Per pulmonary likely need lifelong DOAC.  3.  Acute metabolic encephalopathy -Likely secondary to acute illness of cavitary pneumonia, concerns for hypercarbia with prior history of tobacco use. -Patient with no focal neurological deficits however daughter concerned due to patient's family history and current mental status concern for possible CVA. -CT head done on admission negative for any acute abnormalities. -Repeat head CT. -Ammonia levels slightly elevated at 41. -ABG with a pH of 7.3/pCO2 of 66/pO2 of 73/bicarb of 32. -Placed on lactulose 20 g p.o. twice daily. -Continue treatment as in problem #1 and 2. -Trial of BiPAP. -Placed on Pulmicort, Flonase, scheduled DuoNebs, Claritin, Solu-Medrol 40 mg IV every 12 hours, IV PPI.  4.  Hyperkalemia - Lokelma 10 g p.o. twice daily x 1 day  5.  AKI -Likely secondary  PENDING  Incomplete  Culture, blood (routine x 2)     Status: None (Preliminary result)   Collection Time: 03/14/23  2:57 PM   Specimen: BLOOD  Result Value Ref Range Status   Specimen Description   Final    BLOOD BLOOD RIGHT ARM AEROBIC BOTTLE ONLY ANAEROBIC BOTTLE ONLY Performed at [facility], [address] [place], [place] [address]    Special Requests   Final    BOTTLES DRAWN AEROBIC AND ANAEROBIC Blood Culture adequate volume Performed at [facility], [address] [place], [place] [address]    Culture   Final    NO GROWTH 3 DAYS Performed at [facility], [address], [place], [place] [address]    Report Status PENDING  Incomplete  MRSA Next Gen by PCR, Nasal     Status: None   Collection Time: 03/14/23 10:15 PM   Specimen: Nasal Mucosa; Nasal Swab  Result Value Ref Range Status   MRSA by PCR Next Gen NOT DETECTED NOT DETECTED Final    Comment: (NOTE) The GeneXpert MRSA Assay (FDA approved for NASAL specimens only), is one component of a comprehensive MRSA colonization surveillance program. It is not intended to diagnose MRSA infection nor to guide or monitor treatment for MRSA infections. Test performance is not FDA approved in patients less than 12 years old. Performed at [facility], [address] [place], [place] [address]          Radiology Studies: VAS [place] LOWER EXTREMITY VENOUS (DVT)  Result Date: 03/16/2023  Lower Venous DVT Study Patient Name:  TAKELIA MCBRAYER  Date of Exam:   03/16/2023 Medical Rec #: [id number]        Accession #:    [id number] Date of Birth: 11-Sep-1934       Patient Gender: F Patient Age:   10 years Exam Location:  [facility] Procedure:      VAS [place] LOWER EXTREMITY VENOUS (DVT) Referring Phys: STEPHEN CHIU  --------------------------------------------------------------------------------  Indications: Pulmonary embolism.  Risk Factors: Confirmed PE. Anticoagulation: Heparin. Limitations: Body habitus, poor ultrasound/tissue interface and patient positioning, patient movement, patient pain tolerance, poor patient cooperation. Comparison Study: No prior studies. Performing Technologist: Chanda Busing RVT  Examination Guidelines: A complete evaluation includes B-mode imaging, spectral Doppler, color Doppler, and power Doppler as needed of all accessible portions of each vessel. Bilateral testing is considered an integral part of a complete examination. Limited examinations for reoccurring indications may be performed as noted. The reflux portion of the exam is performed with the patient in reverse Trendelenburg.  +---------+---------------+---------+-----------+----------+-------------------+ RIGHT    CompressibilityPhasicitySpontaneityPropertiesThrombus Aging      +---------+---------------+---------+-----------+----------+-------------------+ CFV      Full           Yes      Yes                                      +---------+---------------+---------+-----------+----------+-------------------+ SFJ      Full                                                             +---------+---------------+---------+-----------+----------+-------------------+ FV Prox  Full                                                             +---------+---------------+---------+-----------+----------+-------------------+

## 2023-03-17 NOTE — Progress Notes (Signed)
Pt set up on Bipap per MD order.  Pt is on 10/5, Rate 10, 35% FIO2.  HR 55, sats 96%. Pt is tolerating well.  Granddaughter at bedside.  RN made aware to watch pt due to her being somewhat obtunded based on her grogginess and ABG results.

## 2023-03-17 NOTE — Progress Notes (Signed)
PHARMACY - ANTICOAGULATION CONSULT NOTE  Pharmacy Consult for heparin  Indication: acute pulmonary embolus and DVT  Allergies  Allergen Reactions   Codeine Hives   Penicillins     REACTION: rash   Morphine Rash    REACTION: pt unsure of reaction    Patient Measurements: Height: 5\' 1"  (154.9 cm) Weight: 116.6 kg (257 lb 0.9 oz) IBW/kg (Calculated) : 47.8 Heparin Dosing Weight: 77 kg  Vital Signs: Temp: 99.1 F (37.3 C) (10/23 0506) Temp Source: Oral (10/23 0506) BP: 163/132 (10/23 0506) Pulse Rate: 66 (10/23 0506)  Labs: Recent Labs    03/14/23 1350 03/14/23 1350 03/14/23 1457 03/14/23 1626 03/15/23 0338 03/15/23 0338 03/16/23 0419 03/16/23 1343 03/16/23 2236 03/17/23 0449  HGB 12.1  --   --   --  12.9  --  11.7*  --   --  13.2  HCT 42.7  --   --   --  45.2  --  42.0  --   --  47.7*  PLT 220  --   --   --  206  --  211  --   --  183  LABPROT 14.1  --   --   --   --   --   --   --   --   --   INR 1.1  --   --   --   --   --   --   --   --   --   HEPARINUNFRC  --   --   --   --   --    < > 0.16* 0.53 0.64 0.65  CREATININE  --    < > 2.28*  --  1.98*  --  1.18*  --   --  0.99  TROPONINIHS  --   --  312* 241*  --   --   --   --   --   --    < > = values in this interval not displayed.    Estimated Creatinine Clearance: 49.4 mL/min (by C-G formula based on SCr of 0.99 mg/dL).   Medical History: Past Medical History:  Diagnosis Date   Acute sinusitis, unspecified 05/28/2008   ANEMIA-IRON DEFICIENCY 10/26/2008   ANXIETY 10/06/2007   CHOLELITHIASIS 10/06/2007   COLONIC POLYPS, HX OF 01/26/2007   DIABETES MELLITUS, TYPE II 10/06/2007   DIVERTICULOSIS, COLON 01/26/2007   ECCHYMOSES, SPONTANEOUS 06/04/2010   GERD 01/26/2007   Heart murmur    slight heart murmur   HERNIATED DISC 01/26/2007   HYPERLIPIDEMIA 10/06/2007   HYPERTENSION 01/26/2007   LOW BACK PAIN 01/29/2007   MENOPAUSAL DISORDER 10/26/2008   OSTEOPOROSIS 06/04/2010   SHOULDER PAIN, LEFT 10/06/2007   Stroke (HCC)     TIA's    Assessment: Patient is an 88 y.o F who presented to the ED on 03/14/23 with c/o SOB. V/Q scan on 03/14/23 showed findings that's consistent with PE. LE doppler: acute deep vein thrombosis involving the left  popliteal vein. She's currently on heparin drip for VTE treatment.  Today, 03/17/2023: - heparin level remains therapeutic at 0.65 with current rate of 1500 units/hr - cbc stable - no bleeding documented - scr trending down    Goal of Therapy:  Heparin level 0.3-0.7 units/ml Monitor platelets by anticoagulation protocol: Yes   Plan:  - continue heparin drip at 1500 units/hr - daily heparin level and cbc  - monitor for s/sx bleeding   Blue Ruggerio P 03/17/2023,7:53 AM

## 2023-03-17 NOTE — Progress Notes (Signed)
Speech Language Pathology Treatment: Dysphagia  Patient Details Name: NIKOLA RENCK MRN: [id number] DOB: 1934-08-05 Today's Date: 03/17/2023 Time: 3818-2993 SLP Time Calculation (min) (ACUTE ONLY): 21 min  Assessment / Plan / Recommendation Clinical Impression  Pt seen to assess po tolerance - Daughter at bedside and breakfast tray arrived just prior to SLP visit.  Pt remains lethargic but briefly would open her eyes and mouth to accept po intake.  Pt consumed a few sips of milk, coffee and bites of yogurt, pureed eggs and cream of wheat. NO clinical indication of aspiration but pt's swallow is very delayed - with episodic sub-swallows.    Pt also continues with significant whitish coating on lingual surface but denies pain. She is a poor historian due to her dementia - and obtaining information from her is sub-optimal at best.  SLP ceased meal as pt began laying her head on her pillow and was not opening to accept any more intake.  Daughter questions if she should hold on po which SLP confirmed.  Daughter reports patient needs nutrition - and her mentation is improved - advised not to feed pt when lethargic due to increased aspiration pneumonia risk.  Will continue efforts - but given pt's ongoing lethargy, her ability to obtain nutrition is suboptimal at best.       HPI HPI: Patient is a 88 year old female admitted to [facility] long hospital with altered mental status, hypoxia, diagnosed with pneumonia and placed on treatment.  Past medical history positive for COVID-19 a few months ago, dementia, CHF, gait abnormalities.  CT chest showed pulmonary infarct as well as groundglass opacities in the right middle lobe and a hiatal hernia.  Swallow evaluation ordered.  [name], daughter reports patient has been able to feed herself with her right and has not had dysphagia prior to recent illness.      SLP Plan  MBS      Recommendations for follow up therapy are one component of a multi-disciplinary  discharge planning process, led by the attending physician.  Recommendations may be updated based on patient status, additional functional criteria and insurance authorization.    Recommendations  Diet recommendations: Dysphagia 1 (puree);Thin liquid Liquids provided via: Straw;Teaspoon;Cup Medication Administration: Crushed with puree Supervision: Full supervision/cueing for compensatory strategies Compensations: Slow rate;Small sips/bites;Other (Comment) (assure pt swallows prior to giving more po intake please) Postural Changes and/or Swallow Maneuvers: Seated upright 90 degrees;Upright 30-60 min after meal                  Oral care BID   None Dysphagia, oral phase (R13.11)     Continue GOC   Rolena Infante, MS [facility] SLP Acute Rehab Services Office [phone]   Chales Abrahams  03/17/2023, 10:40 AM

## 2023-03-17 NOTE — Progress Notes (Signed)
PHARMACY - ANTICOAGULATION CONSULT NOTE  Pharmacy Consult for heparin  Indication: acute pulmonary embolus and DVT  Allergies  Allergen Reactions   Codeine Hives   Penicillins     REACTION: rash   Morphine Rash    REACTION: pt unsure of reaction    Patient Measurements: Height: 5\' 1"  (154.9 cm) Weight: 116.6 kg (257 lb 0.9 oz) IBW/kg (Calculated) : 47.8 Heparin Dosing Weight: 77 kg  Vital Signs: Temp: 99.6 F (37.6 C) (10/22 2322) Temp Source: Oral (10/22 2322) BP: 151/57 (10/22 2322) Pulse Rate: 63 (10/22 2322)  Labs: Recent Labs    03/14/23 1350 03/14/23 1457 03/14/23 1626 03/15/23 0338 03/16/23 0419 03/16/23 1343 03/16/23 2236  HGB 12.1  --   --  12.9 11.7*  --   --   HCT 42.7  --   --  45.2 42.0  --   --   PLT 220  --   --  206 211  --   --   LABPROT 14.1  --   --   --   --   --   --   INR 1.1  --   --   --   --   --   --   HEPARINUNFRC  --   --   --   --  0.16* 0.53 0.64  CREATININE  --  2.28*  --  1.98* 1.18*  --   --   TROPONINIHS  --  312* 241*  --   --   --   --     Estimated Creatinine Clearance: 41.4 mL/min (A) (by C-G formula based on SCr of 1.18 mg/dL (H)).   Medical History: Past Medical History:  Diagnosis Date   Acute sinusitis, unspecified 05/28/2008   ANEMIA-IRON DEFICIENCY 10/26/2008   ANXIETY 10/06/2007   CHOLELITHIASIS 10/06/2007   COLONIC POLYPS, HX OF 01/26/2007   DIABETES MELLITUS, TYPE II 10/06/2007   DIVERTICULOSIS, COLON 01/26/2007   ECCHYMOSES, SPONTANEOUS 06/04/2010   GERD 01/26/2007   Heart murmur    slight heart murmur   HERNIATED DISC 01/26/2007   HYPERLIPIDEMIA 10/06/2007   HYPERTENSION 01/26/2007   LOW BACK PAIN 01/29/2007   MENOPAUSAL DISORDER 10/26/2008   OSTEOPOROSIS 06/04/2010   SHOULDER PAIN, LEFT 10/06/2007   Stroke (HCC)    TIA's    Assessment: Patient is an 88 y.o F who presented to the ED on 03/14/23 with c/o SOB. V/Q scan on 03/14/23 showed findings that's consistent with PE. LE doppler: acute deep vein thrombosis  involving the left  popliteal vein. She's currently on heparin drip for VTE treatment.  Today, 03/17/2023: - heparin level collected at 2236 is therapeutic at 0.64 x2 after rate increased to 1500 units/hr - cbc stable - no bleeding or infusion related concerns reported by RN - scr trending down    Goal of Therapy:  Heparin level 0.3-0.7 units/ml Monitor platelets by anticoagulation protocol: Yes   Plan:  - continue heparin drip at 1500 units/hr - Daily heparin level & CBC  - monitor for s/sx bleeding   Junita Push PharmD 03/17/2023,12:16 AM

## 2023-03-17 NOTE — Progress Notes (Signed)
PT moved on the bipap from room 1422 to ICU/SDU room 1233 with no complications.  RNs at bedside.

## 2023-03-17 NOTE — TOC Benefit Eligibility Note (Signed)
Patient Product/process development scientist completed.    The patient is insured through [employer]. Patient has Medicare and is not eligible for a copay card, but may be able to apply for patient assistance, if available.    Ran test claim for Eliquis 5 mg and the current 30 day co-pay is $0.00.  Ran test claim for Xarelto 20 mg and the current 30 day co-pay is $0.00.   This test claim was processed through [facility]- copay amounts may vary at other pharmacies due to pharmacy/plan contracts, or as the patient moves through the different stages of their insurance plan.     Roland Earl, CPHT Pharmacy Technician III Certified Patient Advocate [facility] Patient Advocate Team Direct Number: [phone]  Fax: [phone]

## 2023-03-17 NOTE — Progress Notes (Signed)
Patient sleeping and still having obstructive sleep apnea episodes despite BiPAP.  Settings adjusted to Ipap= 16, Epap= 10, R-14, 40% FiO2

## 2023-03-17 NOTE — Plan of Care (Signed)
  Problem: Elimination: Goal: Will not experience complications related to urinary retention Outcome: Progressing   Problem: Skin Integrity: Goal: Risk for impaired skin integrity will decrease Outcome: Progressing   Problem: Clinical Measurements: Goal: Diagnostic test results will improve Outcome: Not Progressing Goal: Respiratory complications will improve Outcome: Not Progressing   Problem: Nutrition: Goal: Adequate nutrition will be maintained Outcome: Not Progressing

## 2023-03-17 NOTE — Consult Note (Signed)
NAME:  Kathy Thomas, MRN:  [id number], DOB:  1934-11-16, LOS: 3 ADMISSION DATE:  03/14/2023, CONSULTATION DATE:  10/22 REFERRING MD:  Dr. Rhona Leavens, CHIEF COMPLAINT: Cavitary pneumonia   History of Present Illness:  49F with PMH as below, which is significant for DM, HTN, dementia, and stroke. She resides in SNF where she was started on ertapenem for pneumonia after developing hypoxia and shortness of breath.  She did not have immediate improvement.  On 10/20 in the a.m. hours she was noted to be unresponsive with oxygen saturations in the 60s.  This was ultimately felt to be secondary to hypoglycemia and her responsiveness did somewhat improve with treatment.  She was brought to the emergency department for ongoing evaluation.  Workup for hypoxia included a CT scan of the chest which demonstrated cavitary lesion in the right middle lobe.  There was concern for PE versus atypical pneumonia.  VQ scan was done and was concerning for pulmonary embolism.  She was initiated on treatment with empiric antibiotics as well as systemic anticoagulation.  PCCM was consulted for cavitary pneumonia.  Pertinent  Medical History   has a past medical history of Acute sinusitis, unspecified (05/28/2008), ANEMIA-IRON DEFICIENCY (10/26/2008), ANXIETY (10/06/2007), CHOLELITHIASIS (10/06/2007), COLONIC POLYPS, HX OF (01/26/2007), DIABETES MELLITUS, TYPE II (10/06/2007), DIVERTICULOSIS, COLON (01/26/2007), ECCHYMOSES, SPONTANEOUS (06/04/2010), GERD (01/26/2007), Heart murmur, HERNIATED DISC (01/26/2007), HYPERLIPIDEMIA (10/06/2007), HYPERTENSION (01/26/2007), LOW BACK PAIN (01/29/2007), MENOPAUSAL DISORDER (10/26/2008), OSTEOPOROSIS (06/04/2010), SHOULDER PAIN, LEFT (10/06/2007), and Stroke (HCC).   Significant Hospital Events: Including procedures, antibiotic start and stop dates in addition to other pertinent events   10/20 admit for pneumonia 10/21 VQ + for PE 10/22 PCCM consult  Interim History / Subjective:   More awake today but not back  to baseline.  Objective   Blood pressure (!) 163/132, pulse 66, temperature 99.1 F (37.3 C), temperature source Oral, resp. rate (!) 21, height 5\' 1"  (1.549 m), weight 116.6 kg, SpO2 96%.        Intake/Output Summary (Last 24 hours) at 03/17/2023 0947 Last data filed at 03/17/2023 0400 Gross per 24 hour  Intake 2412.68 ml  Output 850 ml  Net 1562.68 ml   Filed Weights   03/14/23 1803  Weight: 116.6 kg    Examination: Gen:      No acute distress HEENT:  EOMI, sclera anicteric Neck:     No masses; no thyromegaly Lungs:   Clear with no crackles appreciated CV:         Regular rate and rhythm; no murmurs Abd:      + bowel sounds; soft, non-tender; no palpable masses, no distension Ext:    No edema; adequate peripheral perfusion Skin:      Warm and dry; no rash Neuro: Awake, does not answer questions  Echo: LVEF 55-60%, Grade 1 DD CT chest: large mass like consolidation RML with cavitation. Pulmonary infarct vs less likely infection per rad read. Small right pleural effusion.  VQ scan: positive for pulmonary embolism with multiple areas of perfusion defect.   Resolved Hospital Problem list     Assessment & Plan:    Cavitary lesion: radiologist read concerning for pulmonary infarct from PE or atypical pneumonia. Daughter describes possible aspiration. V/Q c/w PE as well.   - Agree with empiric antibiotics.  On ceftriaxone and Flagyl.  Recommend 4 to 6 weeks of antibiotics and will need reimaging as an outpatient  - Sputum culture if able - Swallow eval when more alert  Pulmonary embolism: patient sits in  wheelchair all day at facility per daughter.  DVT L popliteal: - Continue heparin.  Can change to DOAC - May need lifelong anticoagulation at this point.   Goals of care:  - Discussed with daughter. Patient has documentation stating DNR/DNI. Daughter planning to bring the papers in. Would like her to remain Full code at this time.   Per  primary: AKI HTN Dementia Hypoglycemia.    Best Practice (right click and "Reselect all SmartList Selections" daily)   Per primary  Signature:    Chilton Greathouse MD [facility] See Amion for pager  If no response to pager , please call [phone] until 7pm After 7:00 pm call Elink  [phone] 03/17/2023, 9:48 AM

## 2023-03-17 NOTE — Progress Notes (Signed)
OT Cancellation Note  Patient Details Name: Kathy Thomas MRN: [id number] DOB: [birth year]   Cancelled Treatment:    Reason Eval/Treat Not Completed: Medical issues which prohibited therapy. Pt now on Bipap and pending possible transfer to higher level of care, per nurse.   Reuben Likes, OTR/L 03/17/2023, 5:14 PM

## 2023-03-18 DIAGNOSIS — E875 Hyperkalemia: Secondary | ICD-10-CM | POA: Diagnosis not present

## 2023-03-18 DIAGNOSIS — F0390 Unspecified dementia without behavioral disturbance: Secondary | ICD-10-CM | POA: Diagnosis not present

## 2023-03-18 DIAGNOSIS — I2699 Other pulmonary embolism without acute cor pulmonale: Secondary | ICD-10-CM | POA: Diagnosis not present

## 2023-03-18 DIAGNOSIS — J189 Pneumonia, unspecified organism: Secondary | ICD-10-CM | POA: Diagnosis not present

## 2023-03-18 LAB — URINE CULTURE: Culture: 30000 — AB

## 2023-03-18 LAB — BLOOD CULTURE ID PANEL (REFLEXED) - BCID2

## 2023-03-18 LAB — RENAL FUNCTION PANEL
Albumin: 2.1 g/dL — ABNORMAL LOW (ref 3.5–5.0)
Anion gap: 7 (ref 5–15)
BUN: 32 mg/dL — ABNORMAL HIGH (ref 8–23)
CO2: 27 mmol/L (ref 22–32)
Calcium: 8.2 mg/dL — ABNORMAL LOW (ref 8.9–10.3)
Chloride: 106 mmol/L (ref 98–111)
Creatinine, Ser: 0.96 mg/dL (ref 0.44–1.00)
GFR, Estimated: 58 mL/min — ABNORMAL LOW (ref >60)
Glucose, Bld: 280 mg/dL — ABNORMAL HIGH (ref 70–99)
Phosphorus: 3.5 mg/dL (ref 2.5–4.6)
Potassium: 5.5 mmol/L — ABNORMAL HIGH (ref 3.5–5.1)
Sodium: 140 mmol/L (ref 135–145)

## 2023-03-18 LAB — BLOOD GAS, ARTERIAL
Acid-Base Excess: 2.4 mmol/L — ABNORMAL HIGH (ref 0.0–2.0)
Bicarbonate: 31 mmol/L — ABNORMAL HIGH (ref 20.0–28.0)
Drawn by: 20012
FIO2: 36 %
O2 Content: 4 L/min
O2 Saturation: 96 %
Patient temperature: 36.3
pCO2 arterial: 64 mm[Hg] — ABNORMAL HIGH (ref 32–48)
pH, Arterial: 7.29 — ABNORMAL LOW (ref 7.35–7.45)
pO2, Arterial: 66 mm[Hg] — ABNORMAL LOW (ref 83–108)

## 2023-03-18 LAB — CBC
HCT: 44 % (ref 36.0–46.0)
Hemoglobin: 11.9 g/dL — ABNORMAL LOW (ref 12.0–15.0)
MCH: 25.1 pg — ABNORMAL LOW (ref 26.0–34.0)
MCHC: 27 g/dL — ABNORMAL LOW (ref 30.0–36.0)
MCV: 92.6 fL (ref 80.0–100.0)
Platelets: 218 10*3/uL (ref 150–400)
RBC: 4.75 MIL/uL (ref 3.87–5.11)
RDW: 16.5 % — ABNORMAL HIGH (ref 11.5–15.5)
WBC: 5.4 10*3/uL (ref 4.0–10.5)
nRBC: 0 % (ref 0.0–0.2)

## 2023-03-18 LAB — BASIC METABOLIC PANEL
Anion gap: 7 (ref 5–15)
BUN: 32 mg/dL — ABNORMAL HIGH (ref 8–23)
CO2: 25 mmol/L (ref 22–32)
Calcium: 8.1 mg/dL — ABNORMAL LOW (ref 8.9–10.3)
Chloride: 107 mmol/L (ref 98–111)
Creatinine, Ser: 0.96 mg/dL (ref 0.44–1.00)
GFR, Estimated: 58 mL/min — ABNORMAL LOW (ref >60)
Glucose, Bld: 279 mg/dL — ABNORMAL HIGH (ref 70–99)
Potassium: 5.6 mmol/L — ABNORMAL HIGH (ref 3.5–5.1)
Sodium: 139 mmol/L (ref 135–145)

## 2023-03-18 LAB — POTASSIUM: Potassium: 5.9 mmol/L — ABNORMAL HIGH (ref 3.5–5.1)

## 2023-03-18 LAB — GLUCOSE, CAPILLARY
Glucose-Capillary: 245 mg/dL — ABNORMAL HIGH (ref 70–99)
Glucose-Capillary: 258 mg/dL — ABNORMAL HIGH (ref 70–99)
Glucose-Capillary: 225 mg/dL — ABNORMAL HIGH (ref 70–99)
Glucose-Capillary: 316 mg/dL — ABNORMAL HIGH (ref 70–99)

## 2023-03-18 LAB — HEPARIN LEVEL (UNFRACTIONATED): Heparin Unfractionated: >1.1 [IU]/mL — ABNORMAL HIGH (ref 0.30–0.70)

## 2023-03-18 LAB — AMMONIA: Ammonia: 24 umol/L (ref 9–35)

## 2023-03-18 MED ORDER — FUROSEMIDE 10 MG/ML IJ SOLN
40.0000 mg | Freq: Once | INTRAMUSCULAR | Status: AC
Start: 2023-03-18 — End: 2023-03-18
  Administered 2023-03-18: 40 mg via INTRAVENOUS
  Filled 2023-03-18: qty 4

## 2023-03-18 MED ORDER — AMLODIPINE BESYLATE 5 MG PO TABS
5.0000 mg | ORAL_TABLET | Freq: Every day | ORAL | Status: DC
  Administered 2023-03-18 – 2023-03-26 (×9): 5 mg via ORAL
  Filled 2023-03-18 (×11): qty 1

## 2023-03-18 MED ORDER — ALBUMIN HUMAN 25 % IV SOLN
25.0000 g | Freq: Once | INTRAVENOUS | Status: AC
Start: 2023-03-18 — End: 2023-03-18
  Administered 2023-03-18: 25 g via INTRAVENOUS
  Filled 2023-03-18: qty 100

## 2023-03-18 MED ORDER — SODIUM CHLORIDE 0.9 % IV SOLN: INTRAVENOUS | Status: DC

## 2023-03-18 MED ORDER — SODIUM ZIRCONIUM CYCLOSILICATE 10 G PO PACK
10.0000 g | PACK | Freq: Two times a day (BID) | ORAL | Status: DC
  Administered 2023-03-18 (×2): 10 g via ORAL
  Filled 2023-03-18 (×2): qty 1

## 2023-03-18 MED ORDER — HEPARIN (PORCINE) 25000 UT/250ML-% IV SOLN: 1300.0000 [IU]/h | INTRAVENOUS | Status: DC

## 2023-03-18 MED ORDER — ENOXAPARIN SODIUM 120 MG/0.8ML IJ SOSY
1.0000 mg/kg | PREFILLED_SYRINGE | Freq: Two times a day (BID) | INTRAMUSCULAR | Status: DC
  Administered 2023-03-18 – 2023-03-21 (×7): 120 mg via SUBCUTANEOUS
  Filled 2023-03-18 (×7): qty 0.8

## 2023-03-18 NOTE — Progress Notes (Addendum)
SLP Cancellation Note  Patient Details Name: Kathy Thomas MRN: [id number] DOB: 1935/02/18   Cancelled treatment:       Reason Eval/Treat Not Completed: Other (comment);Medical issues which prohibited therapy (pt on Bipap at this time)    Of note, during session yesterday, daughter reported pt had not had anything to eat by mouth including supplement that was left. Informed her that staff would not feed [name] unless she was fully alert; [name] reported she understood.   Rolena Infante, MS [facility] SLP Acute Rehab Services Office [phone]   Chales Abrahams 03/18/2023, 7:35 AM

## 2023-03-18 NOTE — Progress Notes (Signed)
OT Cancellation Note  Patient Details Name: ABIHA IM MRN: [id number] DOB: May 05, 1935   Cancelled Treatment:    Reason Eval/Treat Not Completed: Medical issues which prohibited therapy. Pt now transferred to ICU and on Bipap. Will continue to follow, as appropriate.    Reuben Likes, OTR/L 03/18/2023, 9:13 AM

## 2023-03-18 NOTE — Plan of Care (Signed)
  Problem: Education: Goal: Knowledge of General Education information will improve Description: Including pain rating scale, medication(s)/side effects and non-pharmacologic comfort measures Outcome: Progressing   Problem: Health Behavior/Discharge Planning: Goal: Ability to manage health-related needs will improve Outcome: Progressing   Problem: Clinical Measurements: Goal: Ability to maintain clinical measurements within normal limits will improve Outcome: Progressing Goal: Will remain free from infection Outcome: Progressing Goal: Diagnostic test results will improve Outcome: Progressing Goal: Respiratory complications will improve Outcome: Progressing Goal: Cardiovascular complication will be avoided Outcome: Progressing   Problem: Activity: Goal: Risk for activity intolerance will decrease Outcome: Progressing   Problem: Nutrition: Goal: Adequate nutrition will be maintained Outcome: Progressing   Problem: Coping: Goal: Level of anxiety will decrease Outcome: Progressing   Problem: Elimination: Goal: Will not experience complications related to bowel motility Outcome: Progressing Goal: Will not experience complications related to urinary retention Outcome: Progressing   Problem: Pain Managment: Goal: General experience of comfort will improve Outcome: Progressing   Problem: Safety: Goal: Ability to remain free from injury will improve Outcome: Progressing   Problem: Skin Integrity: Goal: Risk for impaired skin integrity will decrease Outcome: Progressing   Problem: Clinical Measurements: Goal: Ability to maintain a body temperature in the normal range will improve Outcome: Progressing   Problem: Respiratory: Goal: Ability to maintain adequate ventilation will improve Outcome: Progressing   Problem: Education: Goal: Ability to describe self-care measures that may prevent or decrease complications (Diabetes Survival Skills Education) will  improve Outcome: Progressing Goal: Individualized Educational Video(s) Outcome: Progressing   Problem: Coping: Goal: Ability to adjust to condition or change in health will improve Outcome: Progressing   Problem: Fluid Volume: Goal: Ability to maintain a balanced intake and output will improve Outcome: Progressing   Problem: Health Behavior/Discharge Planning: Goal: Ability to identify and utilize available resources and services will improve Outcome: Progressing Goal: Ability to manage health-related needs will improve Outcome: Progressing   Problem: Metabolic: Goal: Ability to maintain appropriate glucose levels will improve Outcome: Progressing   Problem: Nutritional: Goal: Maintenance of adequate nutrition will improve Outcome: Progressing Goal: Progress toward achieving an optimal weight will improve Outcome: Progressing   Problem: Skin Integrity: Goal: Risk for impaired skin integrity will decrease Outcome: Progressing   Problem: Tissue Perfusion: Goal: Adequacy of tissue perfusion will improve Outcome: Progressing

## 2023-03-18 NOTE — Progress Notes (Signed)
PT Cancellation Note  Patient Details Name: Kathy Thomas MRN: [id number] DOB: 15-Nov-1934   Cancelled Treatment:    Reason Eval/Treat Not Completed: Medical issues which prohibited therapy, patient is currently on BiPAP, transferred to ICU .  Blanchard Kelch PT Acute Rehabilitation Services Office [phone] Weekend pager-[phone]    Rada Hay 03/18/2023, 7:54 AM

## 2023-03-18 NOTE — Progress Notes (Signed)
NAME:  Kathy Thomas, MRN:  [id number], DOB:  02-Jun-1934, LOS: 4 ADMISSION DATE:  03/14/2023, CONSULTATION DATE:  10/22 REFERRING MD:  Dr. Rhona Leavens, CHIEF COMPLAINT: Cavitary pneumonia   History of Present Illness:  12F with PMH as below, which is significant for DM, HTN, dementia, and stroke. She resides in SNF where she was started on ertapenem for pneumonia after developing hypoxia and shortness of breath.  She did not have immediate improvement.  On 10/20 in the a.m. hours she was noted to be unresponsive with oxygen saturations in the 60s.  This was ultimately felt to be secondary to hypoglycemia and her responsiveness did somewhat improve with treatment.  She was brought to the emergency department for ongoing evaluation.  Workup for hypoxia included a CT scan of the chest which demonstrated cavitary lesion in the right middle lobe.  There was concern for PE versus atypical pneumonia.  VQ scan was done and was concerning for pulmonary embolism.  She was initiated on treatment with empiric antibiotics as well as systemic anticoagulation.  PCCM was consulted for cavitary pneumonia.  Pertinent  Medical History   has a past medical history of Acute sinusitis, unspecified (05/28/2008), ANEMIA-IRON DEFICIENCY (10/26/2008), ANXIETY (10/06/2007), CHOLELITHIASIS (10/06/2007), COLONIC POLYPS, HX OF (01/26/2007), DIABETES MELLITUS, TYPE II (10/06/2007), DIVERTICULOSIS, COLON (01/26/2007), ECCHYMOSES, SPONTANEOUS (06/04/2010), GERD (01/26/2007), Heart murmur, HERNIATED DISC (01/26/2007), HYPERLIPIDEMIA (10/06/2007), HYPERTENSION (01/26/2007), LOW BACK PAIN (01/29/2007), MENOPAUSAL DISORDER (10/26/2008), OSTEOPOROSIS (06/04/2010), SHOULDER PAIN, LEFT (10/06/2007), and Stroke (HCC).   Significant Hospital Events: Including procedures, antibiotic start and stop dates in addition to other pertinent events   10/20 admit for pneumonia 10/21 VQ + for PE 10/22 PCCM consult for cavitary pneumonia 10/23 tx to SDU for BiPAP in the setting  of lethargy and hypercarbia  Interim History / Subjective:  Tolerated BiPAP most of the night.  More wakeful this morning Witnessed apneic periods when drowns with desaturations to the 70s.   Objective   Blood pressure (!) 158/61, pulse (!) 56, temperature (!) 97.3 F (36.3 C), temperature source Axillary, resp. rate 18, height 5\' 1"  (1.549 m), weight 119.8 kg, SpO2 96%.    FiO2 (%):  [35 %-50 %] 40 %   Intake/Output Summary (Last 24 hours) at 03/18/2023 0831 Last data filed at 03/18/2023 0640 Gross per 24 hour  Intake 1853.2 ml  Output 1000 ml  Net 853.2 ml   Filed Weights   03/14/23 1803 03/17/23 1800  Weight: 116.6 kg 119.8 kg    Examination:  General:  obese elderly female in NAD Neuro:  Arouses to voice. In good spirits. Oriented to self. Recognizes daughter.  HEENT:  [place]/AT, No JVD noted, PERRL Cardiovascular:  RRR, no MRG Lungs:  Diminished bases Abdomen:  Soft, non-distended, non-tender.  Musculoskeletal:  No acute deformity Skin:  Intact, MMM   Echo: LVEF 55-60%, Grade 1 DD CT chest: large mass like consolidation RML with cavitation. Pulmonary infarct vs less likely infection per rad read. Small right pleural effusion.  VQ scan: positive for pulmonary embolism with multiple areas of perfusion defect. LE doppler positive for DVT   Resolved Hospital Problem list     Assessment & Plan:   Cavitary pneumonia: radiologist read concerning for pulmonary infarct from PE or atypical pneumonia. Daughter describes possible aspiration. V/Q c/w PE as well.   - Agree with empiric antibiotics with anaerobic coverage.  On ceftriaxone and Flagyl.  Recommend 4 weeks of antibiotics and will need reimaging as an outpatient.  - Sputum culture if able -  Ongoing evaluation of swallowing/aspiration risk.   Acute respiratory failure with hypercarbia: 10/23 ABG with pH 7.3 and CO2 in the 60s.  Overnight staff noted apneic periods despite BiPAP. She likely has undiagnosed sleep  disordered breathing.  - at bedtime BIPAP + rate - Can repeat ABG if she becomes lethargic once again.  - Will likely need NIVM at discharge.   Pulmonary embolism: patient sits in wheelchair all day at facility per daughter.  DVT L popliteal: - Continue heparin.  Can change to DOAC perprimary - May need lifelong anticoagulation at this point.   Goals of care:  - Discussed with daughter. Patient has previously been DNR/DNI. Family would like to keep full code for now. Discussed pros/cons of code status changes a couple times over the past few days. May be reasonable to involve palliative care team if family is supportive.  Per primary: AKI HTN Dementia Hypoglycemia   Best Practice (right click and "Reselect all SmartList Selections" daily)   Per primary  Signature:     Joneen Roach, AGACNP-BC [facility]  See Amion for personal pager PCCM on call pager [phone] until 7pm. Please call Elink 7p-7a. [phone]  03/18/2023 8:38 AM

## 2023-03-18 NOTE — Progress Notes (Signed)
No                   Acute               +---------+---------------+---------+-----------+----------+-------------------+ PTV      Full                                                             +---------+---------------+---------+-----------+----------+-------------------+ PERO                                                  Not well visualized +---------+---------------+---------+-----------+----------+-------------------+     Summary: RIGHT: - There is no evidence of deep vein thrombosis in the lower extremity. However, portions of this examination were limited- see technologist comments above.  - No cystic structure found in the popliteal fossa.  LEFT: - Findings consistent with acute deep vein thrombosis involving the left popliteal vein.  - No cystic structure found in the popliteal fossa.  *See table(s) above for measurements and observations. Electronically signed by Lemar Livings MD on 03/16/2023 at 4:36:50 PM.    Final    VAS [place] UPPER EXTREMITY VENOUS DUPLEX  Result Date: 03/16/2023 UPPER VENOUS STUDY  Patient Name:  Kathy Thomas  Date of Exam:   03/16/2023 Medical Rec #: [id number]        Accession #:    [id number] Date of Birth: February 05, 1935       Patient Gender: F Patient Age:   88 years Exam Location:  [facility] Procedure:      VAS [place] UPPER EXTREMITY VENOUS DUPLEX Referring Phys: STEPHEN CHIU --------------------------------------------------------------------------------  Indications: Swelling Risk Factors: Confirmed PE. Anticoagulation: Heparin. Limitations: Poor ultrasound/tissue interface, body habitus and patient positioning, patient movement, poor patient cooperation. Comparison Study: No prior studies. Performing Technologist: Chanda Busing RVT  Examination Guidelines: A complete evaluation includes B-mode imaging, spectral Doppler, color  Doppler, and power Doppler as needed of all accessible portions of each vessel. Bilateral testing is considered an integral part of a complete examination. Limited examinations for reoccurring indications may be performed as noted.  Right Findings: +----------+------------+---------+-----------+----------+-------+ RIGHT     CompressiblePhasicitySpontaneousPropertiesSummary +----------+------------+---------+-----------+----------+-------+ Subclavian    Full       Yes       Yes                      +----------+------------+---------+-----------+----------+-------+  Left Findings: +----------+------------+---------+-----------+----------+-------+ LEFT      CompressiblePhasicitySpontaneousPropertiesSummary +----------+------------+---------+-----------+----------+-------+ IJV           Full       Yes       Yes                      +----------+------------+---------+-----------+----------+-------+ Subclavian    Full       Yes       Yes                      +----------+------------+---------+-----------+----------+-------+ Axillary      Full       Yes       Yes                      +----------+------------+---------+-----------+----------+-------+  of the brain. Skull: Negative Sinuses/Orbits: Clear/normal Other: None IMPRESSION: No acute CT finding. Advanced generalized brain volume loss. Widespread chronic small-vessel ischemic changes of the white matter. Electronically Signed   By: Paulina Fusi M.D.   On: 03/17/2023 16:39   VAS [place] LOWER EXTREMITY VENOUS (DVT)  Result Date: 03/16/2023  Lower Venous DVT Study Patient Name:  Kathy Thomas  Date of Exam:   03/16/2023 Medical Rec  #: [id number]        Accession #:    [id number] Date of Birth: 30-Jan-1935       Patient Gender: F Patient Age:   88 years Exam Location:  [facility] Procedure:      VAS [place] LOWER EXTREMITY VENOUS (DVT) Referring Phys: STEPHEN CHIU --------------------------------------------------------------------------------  Indications: Pulmonary embolism.  Risk Factors: Confirmed PE. Anticoagulation: Heparin. Limitations: Body habitus, poor ultrasound/tissue interface and patient positioning, patient movement, patient pain tolerance, poor patient cooperation. Comparison Study: No prior studies. Performing Technologist: Chanda Busing RVT  Examination Guidelines: A complete evaluation includes B-mode imaging, spectral Doppler, color Doppler, and power Doppler as needed of all accessible portions of each vessel. Bilateral testing is considered an integral part of a complete examination. Limited examinations for reoccurring indications may be performed as noted. The reflux portion of the exam is performed with the patient in reverse Trendelenburg.  +---------+---------------+---------+-----------+----------+-------------------+ RIGHT    CompressibilityPhasicitySpontaneityPropertiesThrombus Aging      +---------+---------------+---------+-----------+----------+-------------------+ CFV      Full           Yes      Yes                                      +---------+---------------+---------+-----------+----------+-------------------+ SFJ      Full                                                             +---------+---------------+---------+-----------+----------+-------------------+ FV Prox  Full                                                             +---------+---------------+---------+-----------+----------+-------------------+ FV Mid                  Yes      Yes                                      +---------+---------------+---------+-----------+----------+-------------------+  FV Distal               Yes      Yes                                      +---------+---------------+---------+-----------+----------+-------------------+ PFV  of the brain. Skull: Negative Sinuses/Orbits: Clear/normal Other: None IMPRESSION: No acute CT finding. Advanced generalized brain volume loss. Widespread chronic small-vessel ischemic changes of the white matter. Electronically Signed   By: Paulina Fusi M.D.   On: 03/17/2023 16:39   VAS [place] LOWER EXTREMITY VENOUS (DVT)  Result Date: 03/16/2023  Lower Venous DVT Study Patient Name:  Kathy Thomas  Date of Exam:   03/16/2023 Medical Rec  #: [id number]        Accession #:    [id number] Date of Birth: 30-Jan-1935       Patient Gender: F Patient Age:   88 years Exam Location:  [facility] Procedure:      VAS [place] LOWER EXTREMITY VENOUS (DVT) Referring Phys: STEPHEN CHIU --------------------------------------------------------------------------------  Indications: Pulmonary embolism.  Risk Factors: Confirmed PE. Anticoagulation: Heparin. Limitations: Body habitus, poor ultrasound/tissue interface and patient positioning, patient movement, patient pain tolerance, poor patient cooperation. Comparison Study: No prior studies. Performing Technologist: Chanda Busing RVT  Examination Guidelines: A complete evaluation includes B-mode imaging, spectral Doppler, color Doppler, and power Doppler as needed of all accessible portions of each vessel. Bilateral testing is considered an integral part of a complete examination. Limited examinations for reoccurring indications may be performed as noted. The reflux portion of the exam is performed with the patient in reverse Trendelenburg.  +---------+---------------+---------+-----------+----------+-------------------+ RIGHT    CompressibilityPhasicitySpontaneityPropertiesThrombus Aging      +---------+---------------+---------+-----------+----------+-------------------+ CFV      Full           Yes      Yes                                      +---------+---------------+---------+-----------+----------+-------------------+ SFJ      Full                                                             +---------+---------------+---------+-----------+----------+-------------------+ FV Prox  Full                                                             +---------+---------------+---------+-----------+----------+-------------------+ FV Mid                  Yes      Yes                                      +---------+---------------+---------+-----------+----------+-------------------+  FV Distal               Yes      Yes                                      +---------+---------------+---------+-----------+----------+-------------------+ PFV  of the brain. Skull: Negative Sinuses/Orbits: Clear/normal Other: None IMPRESSION: No acute CT finding. Advanced generalized brain volume loss. Widespread chronic small-vessel ischemic changes of the white matter. Electronically Signed   By: Paulina Fusi M.D.   On: 03/17/2023 16:39   VAS [place] LOWER EXTREMITY VENOUS (DVT)  Result Date: 03/16/2023  Lower Venous DVT Study Patient Name:  Kathy Thomas  Date of Exam:   03/16/2023 Medical Rec  #: [id number]        Accession #:    [id number] Date of Birth: 30-Jan-1935       Patient Gender: F Patient Age:   88 years Exam Location:  [facility] Procedure:      VAS [place] LOWER EXTREMITY VENOUS (DVT) Referring Phys: STEPHEN CHIU --------------------------------------------------------------------------------  Indications: Pulmonary embolism.  Risk Factors: Confirmed PE. Anticoagulation: Heparin. Limitations: Body habitus, poor ultrasound/tissue interface and patient positioning, patient movement, patient pain tolerance, poor patient cooperation. Comparison Study: No prior studies. Performing Technologist: Chanda Busing RVT  Examination Guidelines: A complete evaluation includes B-mode imaging, spectral Doppler, color Doppler, and power Doppler as needed of all accessible portions of each vessel. Bilateral testing is considered an integral part of a complete examination. Limited examinations for reoccurring indications may be performed as noted. The reflux portion of the exam is performed with the patient in reverse Trendelenburg.  +---------+---------------+---------+-----------+----------+-------------------+ RIGHT    CompressibilityPhasicitySpontaneityPropertiesThrombus Aging      +---------+---------------+---------+-----------+----------+-------------------+ CFV      Full           Yes      Yes                                      +---------+---------------+---------+-----------+----------+-------------------+ SFJ      Full                                                             +---------+---------------+---------+-----------+----------+-------------------+ FV Prox  Full                                                             +---------+---------------+---------+-----------+----------+-------------------+ FV Mid                  Yes      Yes                                      +---------+---------------+---------+-----------+----------+-------------------+  FV Distal               Yes      Yes                                      +---------+---------------+---------+-----------+----------+-------------------+ PFV  of the brain. Skull: Negative Sinuses/Orbits: Clear/normal Other: None IMPRESSION: No acute CT finding. Advanced generalized brain volume loss. Widespread chronic small-vessel ischemic changes of the white matter. Electronically Signed   By: Paulina Fusi M.D.   On: 03/17/2023 16:39   VAS [place] LOWER EXTREMITY VENOUS (DVT)  Result Date: 03/16/2023  Lower Venous DVT Study Patient Name:  Kathy Thomas  Date of Exam:   03/16/2023 Medical Rec  #: [id number]        Accession #:    [id number] Date of Birth: 30-Jan-1935       Patient Gender: F Patient Age:   88 years Exam Location:  [facility] Procedure:      VAS [place] LOWER EXTREMITY VENOUS (DVT) Referring Phys: STEPHEN CHIU --------------------------------------------------------------------------------  Indications: Pulmonary embolism.  Risk Factors: Confirmed PE. Anticoagulation: Heparin. Limitations: Body habitus, poor ultrasound/tissue interface and patient positioning, patient movement, patient pain tolerance, poor patient cooperation. Comparison Study: No prior studies. Performing Technologist: Chanda Busing RVT  Examination Guidelines: A complete evaluation includes B-mode imaging, spectral Doppler, color Doppler, and power Doppler as needed of all accessible portions of each vessel. Bilateral testing is considered an integral part of a complete examination. Limited examinations for reoccurring indications may be performed as noted. The reflux portion of the exam is performed with the patient in reverse Trendelenburg.  +---------+---------------+---------+-----------+----------+-------------------+ RIGHT    CompressibilityPhasicitySpontaneityPropertiesThrombus Aging      +---------+---------------+---------+-----------+----------+-------------------+ CFV      Full           Yes      Yes                                      +---------+---------------+---------+-----------+----------+-------------------+ SFJ      Full                                                             +---------+---------------+---------+-----------+----------+-------------------+ FV Prox  Full                                                             +---------+---------------+---------+-----------+----------+-------------------+ FV Mid                  Yes      Yes                                      +---------+---------------+---------+-----------+----------+-------------------+  FV Distal               Yes      Yes                                      +---------+---------------+---------+-----------+----------+-------------------+ PFV  PROGRESS NOTE    Kathy Thomas  NFA:[id number] DOB: 10/16/1934 DOA: 03/14/2023 PCP: Marden Noble, MD (Inactive)   Chief Complaint  Patient presents with   Hypoglycemia   Shortness of Breath    Brief Narrative:  88 y.o. female with medical history significant of dementia, hypertension who presented to the emergency department due to shortness of breath.  Patient was found to be hypoxic 3 days ago and underwent chest x-ray which showed concern for community-acquired pneumonia.  She was started on ertapenem and received 3 doses.  By the nursing facility she developed tachypnea and shortness of breath so EMS was called to bring her to the emergency department further assessment.  On EMS arrival she was found to be hypoglycemic.  She was brought to the ER where she was hypoxic requiring up to 6 L nasal cannula. Pt was admitted for concerns of cavitary PNA    Assessment & Plan:   Principal Problem:   Cavitary pneumonia Active Problems:   Pulmonary embolism (HCC)   DVT (deep venous thrombosis) (HCC)   DM type 2 (diabetes mellitus, type 2) (HCC)   HTN (hypertension)   Dementia without behavioral disturbance (HCC)   Diabetes mellitus without complication (HCC)   Pressure injury of skin   Hyperkalemia   AKI (acute kidney injury) (HCC)   Oral thrush   #1 cavitary pneumonia -Patient noted to have presented with altered mental status, tachypnea, worsening shortness of breath and noted to have been hypoxic 3 days prior to admission underwent chest x-ray concerning for community-acquired pneumonia. -On arrival to the ED patient noted to be hypoxic requiring up to 6 L nasal cannula and admitted for concerns for cavitary pneumonia. -CT chest done with findings notable for a large masslike consolidation of the RML with central groundglass, cavitation, peripheral ring of dense consolidation concerning for pulmonary infarct versus atypical cavitary pneumonia.  Small right pleural effusion  noted. -Patient noted to have completed ertapenem prior to admission. -Patient drowsy/lethargic the evening of 03/16/2023 and in the morning of 03/17/2023 noted overnight and also this morning. -Patient was placed on BiPAP yesterday and overnight with clinical improvement currently on 3 to 4 L nasal cannula with sats of 96% -Patient seen in consultation by pulmonary who assessed the patient and recommending continuation of antibiotics of IV Rocephin and Flagyl and recommended total of 4 to 6 weeks of antibiotics await reimaging in the outpatient setting. -Patient assessed by SLP. -Supportive care.  2.  PE/left popliteal DVT -Noted on V/Q scan with findings confirming PE. -Lower extremity Dopplers with left lower extremity DVT. -2D echo with EF of 55 to 60%, mild concentric LVH, grade 1 DD, full study not completed as patient noted to have refused exam. -Change heparin drip to full dose Lovenox and if patient continues to be more clinically improved and alert could likely change to a DOAC in the next 24 hours.  -Per pulmonary likely need lifelong DOAC.  3.  Acute metabolic encephalopathy/acute respiratory failure with hypercarbia -Likely multifactorial secondary to hypercarbia in the setting of acute illness of cavitary pneumonia, with prior history of tobacco use. -Patient with no focal neurological deficits however daughter concerned due to patient's family history and current mental status concern for possible CVA. -CT head done on admission negative for any acute abnormalities. -Repeat head CT negative for any acute abnormalities.. -Ammonia levels slightly elevated at 41 and trending down with lactulose. -ABG with a pH of 7.3/pCO2 of 66/pO2 of 73/bicarb of 32. -Patient with clinical improvement  of the brain. Skull: Negative Sinuses/Orbits: Clear/normal Other: None IMPRESSION: No acute CT finding. Advanced generalized brain volume loss. Widespread chronic small-vessel ischemic changes of the white matter. Electronically Signed   By: Paulina Fusi M.D.   On: 03/17/2023 16:39   VAS [place] LOWER EXTREMITY VENOUS (DVT)  Result Date: 03/16/2023  Lower Venous DVT Study Patient Name:  Kathy Thomas  Date of Exam:   03/16/2023 Medical Rec  #: [id number]        Accession #:    [id number] Date of Birth: 30-Jan-1935       Patient Gender: F Patient Age:   88 years Exam Location:  [facility] Procedure:      VAS [place] LOWER EXTREMITY VENOUS (DVT) Referring Phys: STEPHEN CHIU --------------------------------------------------------------------------------  Indications: Pulmonary embolism.  Risk Factors: Confirmed PE. Anticoagulation: Heparin. Limitations: Body habitus, poor ultrasound/tissue interface and patient positioning, patient movement, patient pain tolerance, poor patient cooperation. Comparison Study: No prior studies. Performing Technologist: Chanda Busing RVT  Examination Guidelines: A complete evaluation includes B-mode imaging, spectral Doppler, color Doppler, and power Doppler as needed of all accessible portions of each vessel. Bilateral testing is considered an integral part of a complete examination. Limited examinations for reoccurring indications may be performed as noted. The reflux portion of the exam is performed with the patient in reverse Trendelenburg.  +---------+---------------+---------+-----------+----------+-------------------+ RIGHT    CompressibilityPhasicitySpontaneityPropertiesThrombus Aging      +---------+---------------+---------+-----------+----------+-------------------+ CFV      Full           Yes      Yes                                      +---------+---------------+---------+-----------+----------+-------------------+ SFJ      Full                                                             +---------+---------------+---------+-----------+----------+-------------------+ FV Prox  Full                                                             +---------+---------------+---------+-----------+----------+-------------------+ FV Mid                  Yes      Yes                                      +---------+---------------+---------+-----------+----------+-------------------+  FV Distal               Yes      Yes                                      +---------+---------------+---------+-----------+----------+-------------------+ PFV  No                   Acute               +---------+---------------+---------+-----------+----------+-------------------+ PTV      Full                                                             +---------+---------------+---------+-----------+----------+-------------------+ PERO                                                  Not well visualized +---------+---------------+---------+-----------+----------+-------------------+     Summary: RIGHT: - There is no evidence of deep vein thrombosis in the lower extremity. However, portions of this examination were limited- see technologist comments above.  - No cystic structure found in the popliteal fossa.  LEFT: - Findings consistent with acute deep vein thrombosis involving the left popliteal vein.  - No cystic structure found in the popliteal fossa.  *See table(s) above for measurements and observations. Electronically signed by Lemar Livings MD on 03/16/2023 at 4:36:50 PM.    Final    VAS [place] UPPER EXTREMITY VENOUS DUPLEX  Result Date: 03/16/2023 UPPER VENOUS STUDY  Patient Name:  Kathy Thomas  Date of Exam:   03/16/2023 Medical Rec #: [id number]        Accession #:    [id number] Date of Birth: February 05, 1935       Patient Gender: F Patient Age:   88 years Exam Location:  [facility] Procedure:      VAS [place] UPPER EXTREMITY VENOUS DUPLEX Referring Phys: STEPHEN CHIU --------------------------------------------------------------------------------  Indications: Swelling Risk Factors: Confirmed PE. Anticoagulation: Heparin. Limitations: Poor ultrasound/tissue interface, body habitus and patient positioning, patient movement, poor patient cooperation. Comparison Study: No prior studies. Performing Technologist: Chanda Busing RVT  Examination Guidelines: A complete evaluation includes B-mode imaging, spectral Doppler, color  Doppler, and power Doppler as needed of all accessible portions of each vessel. Bilateral testing is considered an integral part of a complete examination. Limited examinations for reoccurring indications may be performed as noted.  Right Findings: +----------+------------+---------+-----------+----------+-------+ RIGHT     CompressiblePhasicitySpontaneousPropertiesSummary +----------+------------+---------+-----------+----------+-------+ Subclavian    Full       Yes       Yes                      +----------+------------+---------+-----------+----------+-------+  Left Findings: +----------+------------+---------+-----------+----------+-------+ LEFT      CompressiblePhasicitySpontaneousPropertiesSummary +----------+------------+---------+-----------+----------+-------+ IJV           Full       Yes       Yes                      +----------+------------+---------+-----------+----------+-------+ Subclavian    Full       Yes       Yes                      +----------+------------+---------+-----------+----------+-------+ Axillary      Full       Yes       Yes                      +----------+------------+---------+-----------+----------+-------+  No                   Acute               +---------+---------------+---------+-----------+----------+-------------------+ PTV      Full                                                             +---------+---------------+---------+-----------+----------+-------------------+ PERO                                                  Not well visualized +---------+---------------+---------+-----------+----------+-------------------+     Summary: RIGHT: - There is no evidence of deep vein thrombosis in the lower extremity. However, portions of this examination were limited- see technologist comments above.  - No cystic structure found in the popliteal fossa.  LEFT: - Findings consistent with acute deep vein thrombosis involving the left popliteal vein.  - No cystic structure found in the popliteal fossa.  *See table(s) above for measurements and observations. Electronically signed by Lemar Livings MD on 03/16/2023 at 4:36:50 PM.    Final    VAS [place] UPPER EXTREMITY VENOUS DUPLEX  Result Date: 03/16/2023 UPPER VENOUS STUDY  Patient Name:  Kathy Thomas  Date of Exam:   03/16/2023 Medical Rec #: [id number]        Accession #:    [id number] Date of Birth: February 05, 1935       Patient Gender: F Patient Age:   88 years Exam Location:  [facility] Procedure:      VAS [place] UPPER EXTREMITY VENOUS DUPLEX Referring Phys: STEPHEN CHIU --------------------------------------------------------------------------------  Indications: Swelling Risk Factors: Confirmed PE. Anticoagulation: Heparin. Limitations: Poor ultrasound/tissue interface, body habitus and patient positioning, patient movement, poor patient cooperation. Comparison Study: No prior studies. Performing Technologist: Chanda Busing RVT  Examination Guidelines: A complete evaluation includes B-mode imaging, spectral Doppler, color  Doppler, and power Doppler as needed of all accessible portions of each vessel. Bilateral testing is considered an integral part of a complete examination. Limited examinations for reoccurring indications may be performed as noted.  Right Findings: +----------+------------+---------+-----------+----------+-------+ RIGHT     CompressiblePhasicitySpontaneousPropertiesSummary +----------+------------+---------+-----------+----------+-------+ Subclavian    Full       Yes       Yes                      +----------+------------+---------+-----------+----------+-------+  Left Findings: +----------+------------+---------+-----------+----------+-------+ LEFT      CompressiblePhasicitySpontaneousPropertiesSummary +----------+------------+---------+-----------+----------+-------+ IJV           Full       Yes       Yes                      +----------+------------+---------+-----------+----------+-------+ Subclavian    Full       Yes       Yes                      +----------+------------+---------+-----------+----------+-------+ Axillary      Full       Yes       Yes                      +----------+------------+---------+-----------+----------+-------+  No                   Acute               +---------+---------------+---------+-----------+----------+-------------------+ PTV      Full                                                             +---------+---------------+---------+-----------+----------+-------------------+ PERO                                                  Not well visualized +---------+---------------+---------+-----------+----------+-------------------+     Summary: RIGHT: - There is no evidence of deep vein thrombosis in the lower extremity. However, portions of this examination were limited- see technologist comments above.  - No cystic structure found in the popliteal fossa.  LEFT: - Findings consistent with acute deep vein thrombosis involving the left popliteal vein.  - No cystic structure found in the popliteal fossa.  *See table(s) above for measurements and observations. Electronically signed by Lemar Livings MD on 03/16/2023 at 4:36:50 PM.    Final    VAS [place] UPPER EXTREMITY VENOUS DUPLEX  Result Date: 03/16/2023 UPPER VENOUS STUDY  Patient Name:  Kathy Thomas  Date of Exam:   03/16/2023 Medical Rec #: [id number]        Accession #:    [id number] Date of Birth: February 05, 1935       Patient Gender: F Patient Age:   88 years Exam Location:  [facility] Procedure:      VAS [place] UPPER EXTREMITY VENOUS DUPLEX Referring Phys: STEPHEN CHIU --------------------------------------------------------------------------------  Indications: Swelling Risk Factors: Confirmed PE. Anticoagulation: Heparin. Limitations: Poor ultrasound/tissue interface, body habitus and patient positioning, patient movement, poor patient cooperation. Comparison Study: No prior studies. Performing Technologist: Chanda Busing RVT  Examination Guidelines: A complete evaluation includes B-mode imaging, spectral Doppler, color  Doppler, and power Doppler as needed of all accessible portions of each vessel. Bilateral testing is considered an integral part of a complete examination. Limited examinations for reoccurring indications may be performed as noted.  Right Findings: +----------+------------+---------+-----------+----------+-------+ RIGHT     CompressiblePhasicitySpontaneousPropertiesSummary +----------+------------+---------+-----------+----------+-------+ Subclavian    Full       Yes       Yes                      +----------+------------+---------+-----------+----------+-------+  Left Findings: +----------+------------+---------+-----------+----------+-------+ LEFT      CompressiblePhasicitySpontaneousPropertiesSummary +----------+------------+---------+-----------+----------+-------+ IJV           Full       Yes       Yes                      +----------+------------+---------+-----------+----------+-------+ Subclavian    Full       Yes       Yes                      +----------+------------+---------+-----------+----------+-------+ Axillary      Full       Yes       Yes                      +----------+------------+---------+-----------+----------+-------+

## 2023-03-18 NOTE — Progress Notes (Signed)
PHARMACY - ANTICOAGULATION CONSULT NOTE  Pharmacy Consult for Lovenox Indication: pulmonary embolus and DVT  Allergies  Allergen Reactions   Codeine Hives   Penicillins     REACTION: rash   Morphine Rash    REACTION: pt unsure of reaction    Patient Measurements: Height: 5\' 1"  (154.9 cm) Weight: 119.8 kg (264 lb 1.8 oz) IBW/kg (Calculated) : 47.8 Heparin Dosing Weight: 77 kg  Vital Signs: Temp: 97.4 F (36.3 C) (10/24 0800) Temp Source: Axillary (10/24 0800) BP: 156/56 (10/24 0900) Pulse Rate: 53 (10/24 0900)  Labs: Recent Labs    03/16/23 0419 03/16/23 1343 03/16/23 2236 03/17/23 0449 03/18/23 0335 03/18/23 0347  HGB 11.7*  --   --  13.2 11.9*  --   HCT 42.0  --   --  47.7* 44.0  --   PLT 211  --   --  183 218  --   HEPARINUNFRC 0.16*   < > 0.64 0.65 >1.10*  --   CREATININE 1.18*  --   --  0.99 0.96 0.96   < > = values in this interval not displayed.    Estimated Creatinine Clearance: 51.8 mL/min (by C-G formula based on SCr of 0.96 mg/dL).   Medical History: Past Medical History:  Diagnosis Date   Acute sinusitis, unspecified 05/28/2008   ANEMIA-IRON DEFICIENCY 10/26/2008   ANXIETY 10/06/2007   CHOLELITHIASIS 10/06/2007   COLONIC POLYPS, HX OF 01/26/2007   DIABETES MELLITUS, TYPE II 10/06/2007   DIVERTICULOSIS, COLON 01/26/2007   ECCHYMOSES, SPONTANEOUS 06/04/2010   GERD 01/26/2007   Heart murmur    slight heart murmur   HERNIATED DISC 01/26/2007   HYPERLIPIDEMIA 10/06/2007   HYPERTENSION 01/26/2007   LOW BACK PAIN 01/29/2007   MENOPAUSAL DISORDER 10/26/2008   OSTEOPOROSIS 06/04/2010   SHOULDER PAIN, LEFT 10/06/2007   Stroke (HCC)    TIA's    Assessment: Patient is an 88 y.o F who presented to the ED on 03/14/23 with c/o SOB. V/Q scan on 03/14/23 showed findings that's consistent with PE. LE doppler: acute deep vein thrombosis involving the left  popliteal vein. Pharmacy was consulted to dose heparin IV for VTE treatment while unable to take PO meds.  Now, transition  to Lovenox since still unreliable PO intake.    Heparin level most recently elevated at > 1.1 CBC:  Hgb decreased to 11.9 (fluctuates 11.7-13.2), Plt WNL No bleeding or infusion related concerns reported by RN SCr 0.96, CrCl ~ 52 ml/min   Goal of Therapy:  Anti-Xa level 0.6-1 units/ml 4hrs after LMWH dose given Monitor platelets by anticoagulation protocol: Yes   Plan:  - D/C heparin infusion - Start Lovenox 1 mg/kg (120mg ) SQ q12h - Noted plans to switch to DOAC once able to consistently take PO meds.   Lynann Beaver PharmD, BCPS [facility] main pharmacy [phone] 03/18/2023 11:06 AM

## 2023-03-18 NOTE — Progress Notes (Signed)
PHARMACY - PHYSICIAN COMMUNICATION CRITICAL VALUE ALERT - BLOOD CULTURE IDENTIFICATION (BCID)  Kathy Thomas is an 88 y.o. female who presented to [facility] on 03/14/2023 with cavitary pneumonia  Assessment: BCID + methicillin resistant staph epidermidis in 1 out of 4 bottles.  Patient afebrile; normal WBC.  Name of physician (or Provider) Contacted: Anthoney Harada, FNP  Current antibiotics: Ceftriaxone, Metronidazole  Changes to prescribed antibiotics recommended:  No changes at this time  Results for orders placed or performed during the hospital encounter of 03/14/23  Blood Culture ID Panel (Reflexed) (Collected: 03/14/2023  2:57 PM)  Result Value Ref Range   Enterococcus faecalis NOT DETECTED NOT DETECTED   Enterococcus Faecium NOT DETECTED NOT DETECTED   Listeria monocytogenes NOT DETECTED NOT DETECTED   Staphylococcus species DETECTED (A) NOT DETECTED   Staphylococcus aureus (BCID) NOT DETECTED NOT DETECTED   Staphylococcus epidermidis DETECTED (A) NOT DETECTED   Staphylococcus lugdunensis NOT DETECTED NOT DETECTED   Streptococcus species NOT DETECTED NOT DETECTED   Streptococcus agalactiae NOT DETECTED NOT DETECTED   Streptococcus pneumoniae NOT DETECTED NOT DETECTED   Streptococcus pyogenes NOT DETECTED NOT DETECTED   A.calcoaceticus-baumannii NOT DETECTED NOT DETECTED   Bacteroides fragilis NOT DETECTED NOT DETECTED   Enterobacterales NOT DETECTED NOT DETECTED   Enterobacter cloacae complex NOT DETECTED NOT DETECTED   Escherichia coli NOT DETECTED NOT DETECTED   Klebsiella aerogenes NOT DETECTED NOT DETECTED   Klebsiella oxytoca NOT DETECTED NOT DETECTED   Klebsiella pneumoniae NOT DETECTED NOT DETECTED   Proteus species NOT DETECTED NOT DETECTED   Salmonella species NOT DETECTED NOT DETECTED   Serratia marcescens NOT DETECTED NOT DETECTED   Haemophilus influenzae NOT DETECTED NOT DETECTED   Neisseria meningitidis NOT DETECTED NOT DETECTED   Pseudomonas  aeruginosa NOT DETECTED NOT DETECTED   Stenotrophomonas maltophilia NOT DETECTED NOT DETECTED   Candida albicans NOT DETECTED NOT DETECTED   Candida auris NOT DETECTED NOT DETECTED   Candida glabrata NOT DETECTED NOT DETECTED   Candida krusei NOT DETECTED NOT DETECTED   Candida parapsilosis NOT DETECTED NOT DETECTED   Candida tropicalis NOT DETECTED NOT DETECTED   Cryptococcus neoformans/gattii NOT DETECTED NOT DETECTED   Methicillin resistance mecA/C DETECTED (A) NOT DETECTED    Marbeth Smedley, Joselyn Glassman, PharmD 03/18/2023  11:05 PM

## 2023-03-18 NOTE — Plan of Care (Signed)
  Problem: Nutrition: Goal: Adequate nutrition will be maintained Outcome: Progressing   Problem: Safety: Goal: Ability to remain free from injury will improve Outcome: Progressing   Problem: Skin Integrity: Goal: Risk for impaired skin integrity will decrease Outcome: Progressing   Problem: Health Behavior/Discharge Planning: Goal: Ability to identify and utilize available resources and services will improve Outcome: Not Progressing

## 2023-03-18 NOTE — Progress Notes (Signed)
PHARMACY - ANTICOAGULATION CONSULT NOTE  Pharmacy Consult for heparin  Indication: acute pulmonary embolus and DVT  Allergies  Allergen Reactions   Codeine Hives   Penicillins     REACTION: rash   Morphine Rash    REACTION: pt unsure of reaction    Patient Measurements: Height: 5\' 1"  (154.9 cm) Weight: 119.8 kg (264 lb 1.8 oz) IBW/kg (Calculated) : 47.8 Heparin Dosing Weight: 77 kg  Vital Signs: Temp: 97.3 F (36.3 C) (10/24 0404) Temp Source: Axillary (10/24 0404) BP: 142/68 (10/24 0200) Pulse Rate: 52 (10/24 0255)  Labs: Recent Labs    03/16/23 0419 03/16/23 1343 03/16/23 2236 03/17/23 0449 03/18/23 0335 03/18/23 0347  HGB 11.7*  --   --  13.2 11.9*  --   HCT 42.0  --   --  47.7* 44.0  --   PLT 211  --   --  183 218  --   HEPARINUNFRC 0.16*   < > 0.64 0.65 >1.10*  --   CREATININE 1.18*  --   --  0.99 0.96 0.96   < > = values in this interval not displayed.    Estimated Creatinine Clearance: 51.8 mL/min (by C-G formula based on SCr of 0.96 mg/dL).   Medical History: Past Medical History:  Diagnosis Date   Acute sinusitis, unspecified 05/28/2008   ANEMIA-IRON DEFICIENCY 10/26/2008   ANXIETY 10/06/2007   CHOLELITHIASIS 10/06/2007   COLONIC POLYPS, HX OF 01/26/2007   DIABETES MELLITUS, TYPE II 10/06/2007   DIVERTICULOSIS, COLON 01/26/2007   ECCHYMOSES, SPONTANEOUS 06/04/2010   GERD 01/26/2007   Heart murmur    slight heart murmur   HERNIATED DISC 01/26/2007   HYPERLIPIDEMIA 10/06/2007   HYPERTENSION 01/26/2007   LOW BACK PAIN 01/29/2007   MENOPAUSAL DISORDER 10/26/2008   OSTEOPOROSIS 06/04/2010   SHOULDER PAIN, LEFT 10/06/2007   Stroke (HCC)    TIA's    Assessment: Patient is an 88 y.o F who presented to the ED on 03/14/23 with c/o SOB. V/Q scan on 03/14/23 showed findings that's consistent with PE. LE doppler: acute deep vein thrombosis involving the left  popliteal vein. She's currently on heparin drip for VTE treatment.  Today, 03/18/2023: - heparin level >1.1- now  supra-therapeutic on IV heparin 1500 units/hr Verified that lab drawn correctly from arm opposite heparin infusion   - cbc stable - no bleeding or infusion related concerns reported by RN   Goal of Therapy:  Heparin level 0.3-0.7 units/ml Monitor platelets by anticoagulation protocol: Yes   Plan:  - Hold heparin x 90 minutes then restart heparin drip at lower rate of 1300 units/hr - Recheck heparin level 8h after heparin resumes - daily heparin level and cbc  - monitor for s/sx bleeding  -Noted plans to switch to DOAC once more stable  Junita Push PharmD 03/18/2023,4:35 AM

## 2023-03-18 NOTE — Inpatient Diabetes Management (Signed)
Inpatient Diabetes Program Recommendations  AACE/ADA: New Consensus Statement on Inpatient Glycemic Control (2015)  Target Ranges:  Prepandial:   less than 140 mg/dL      Peak postprandial:   less than 180 mg/dL (1-2 hours)      Critically ill patients:  140 - 180 mg/dL   Lab Results  Component Value Date   GLUCAP 245 (H) 03/18/2023   HGBA1C 7.1 (H) 03/15/2023    Review of Glycemic Control  Latest Reference Range & Units 03/17/23 07:29 03/17/23 11:15 03/17/23 16:22 03/17/23 21:27 03/18/23 07:26  Glucose-Capillary 70 - 99 mg/dL 119 (H) 147 (H) 829 (H) 211 (H) 245 (H)   Diabetes history: DM 2 Outpatient Diabetes medications: Novolog 0-12 BID per SSI + Amaryl 2 mg Daily + Jardiance 25 mg Daily  Current orders for Inpatient glycemic control:  Novolog 0-6 units tid + hs  A1c 7.1 on 10/21 Solumedrol 40 mg Q12 hrs Note: pt seems to have low percentage of PO intake  Inpatient Diabetes Program Recommendations:    -  Increase Novolog Correction to 0-9 units tid + hs  Thanks,  Christena Deem RN, MSN, BC-ADM Inpatient Diabetes Coordinator Team Pager [phone] (8a-5p)

## 2023-03-19 DIAGNOSIS — I2699 Other pulmonary embolism without acute cor pulmonale: Secondary | ICD-10-CM | POA: Diagnosis not present

## 2023-03-19 DIAGNOSIS — J189 Pneumonia, unspecified organism: Secondary | ICD-10-CM | POA: Diagnosis not present

## 2023-03-19 DIAGNOSIS — J984 Other disorders of lung: Secondary | ICD-10-CM | POA: Diagnosis not present

## 2023-03-19 DIAGNOSIS — F0390 Unspecified dementia without behavioral disturbance: Secondary | ICD-10-CM | POA: Diagnosis not present

## 2023-03-19 DIAGNOSIS — E875 Hyperkalemia: Secondary | ICD-10-CM | POA: Diagnosis not present

## 2023-03-19 LAB — BASIC METABOLIC PANEL
Anion gap: 7 (ref 5–15)
BUN: 39 mg/dL — ABNORMAL HIGH (ref 8–23)
CO2: 28 mmol/L (ref 22–32)
Calcium: 8.1 mg/dL — ABNORMAL LOW (ref 8.9–10.3)
Chloride: 110 mmol/L (ref 98–111)
Creatinine, Ser: 0.95 mg/dL (ref 0.44–1.00)
GFR, Estimated: 59 mL/min — ABNORMAL LOW (ref >60)
Glucose, Bld: 281 mg/dL — ABNORMAL HIGH (ref 70–99)
Potassium: 4.7 mmol/L (ref 3.5–5.1)
Sodium: 145 mmol/L (ref 135–145)

## 2023-03-19 LAB — GLUCOSE, CAPILLARY
Glucose-Capillary: 204 mg/dL — ABNORMAL HIGH (ref 70–99)
Glucose-Capillary: 206 mg/dL — ABNORMAL HIGH (ref 70–99)
Glucose-Capillary: 242 mg/dL — ABNORMAL HIGH (ref 70–99)
Glucose-Capillary: 225 mg/dL — ABNORMAL HIGH (ref 70–99)

## 2023-03-19 LAB — CBC
HCT: 42.9 % (ref 36.0–46.0)
Hemoglobin: 11.8 g/dL — ABNORMAL LOW (ref 12.0–15.0)
MCH: 25.7 pg — ABNORMAL LOW (ref 26.0–34.0)
MCHC: 27.5 g/dL — ABNORMAL LOW (ref 30.0–36.0)
MCV: 93.5 fL (ref 80.0–100.0)
Platelets: 229 10*3/uL (ref 150–400)
RBC: 4.59 MIL/uL (ref 3.87–5.11)
RDW: 16.8 % — ABNORMAL HIGH (ref 11.5–15.5)
WBC: 9.5 10*3/uL (ref 4.0–10.5)
nRBC: 0 % (ref 0.0–0.2)

## 2023-03-19 LAB — MAGNESIUM: Magnesium: 2.5 mg/dL — ABNORMAL HIGH (ref 1.7–2.4)

## 2023-03-19 LAB — CULTURE, BLOOD (ROUTINE X 2)
Culture: NO GROWTH
Special Requests: ADEQUATE
Special Requests: ADEQUATE

## 2023-03-19 MED ORDER — INSULIN GLARGINE-YFGN 100 UNIT/ML ~~LOC~~ SOLN
5.0000 [IU] | Freq: Every day | SUBCUTANEOUS | Status: DC
Start: 2023-03-19 — End: 2023-03-21
  Administered 2023-03-19 – 2023-03-20 (×2): 5 [IU] via SUBCUTANEOUS
  Filled 2023-03-19 (×3): qty 0.05

## 2023-03-19 MED ORDER — PREDNISONE 20 MG PO TABS
40.0000 mg | ORAL_TABLET | Freq: Every day | ORAL | Status: AC
  Administered 2023-03-20 – 2023-03-22 (×3): 40 mg via ORAL
  Filled 2023-03-19 (×3): qty 2

## 2023-03-19 MED ORDER — GLUCERNA SHAKE PO LIQD
237.0000 mL | Freq: Two times a day (BID) | ORAL | Status: DC
  Administered 2023-03-20 – 2023-04-01 (×8): 237 mL via ORAL
  Filled 2023-03-19 (×28): qty 237

## 2023-03-19 MED ORDER — INSULIN ASPART 100 UNIT/ML IJ SOLN
0.0000 [IU] | Freq: Three times a day (TID) | INTRAMUSCULAR | Status: DC
  Administered 2023-03-19 (×2): 3 [IU] via SUBCUTANEOUS
  Administered 2023-03-20: 7 [IU] via SUBCUTANEOUS
  Administered 2023-03-20: 3 [IU] via SUBCUTANEOUS
  Administered 2023-03-20: 2 [IU] via SUBCUTANEOUS
  Administered 2023-03-21: 5 [IU] via SUBCUTANEOUS
  Administered 2023-03-21 (×2): 2 [IU] via SUBCUTANEOUS
  Administered 2023-03-22: 3 [IU] via SUBCUTANEOUS
  Administered 2023-03-22 – 2023-03-23 (×3): 1 [IU] via SUBCUTANEOUS
  Administered 2023-03-23: 3 [IU] via SUBCUTANEOUS
  Administered 2023-03-23: 2 [IU] via SUBCUTANEOUS
  Administered 2023-03-24: 3 [IU] via SUBCUTANEOUS
  Administered 2023-03-24 – 2023-03-25 (×4): 1 [IU] via SUBCUTANEOUS
  Administered 2023-03-25: 5 [IU] via SUBCUTANEOUS
  Administered 2023-03-26: 1 [IU] via SUBCUTANEOUS
  Administered 2023-03-26 (×2): 3 [IU] via SUBCUTANEOUS
  Administered 2023-03-27 (×2): 2 [IU] via SUBCUTANEOUS
  Administered 2023-03-27: 1 [IU] via SUBCUTANEOUS
  Administered 2023-03-28 (×2): 2 [IU] via SUBCUTANEOUS
  Administered 2023-03-28: 1 [IU] via SUBCUTANEOUS
  Administered 2023-03-29 (×2): 2 [IU] via SUBCUTANEOUS
  Administered 2023-03-29: 3 [IU] via SUBCUTANEOUS
  Administered 2023-03-30 (×2): 2 [IU] via SUBCUTANEOUS
  Administered 2023-03-30: 3 [IU] via SUBCUTANEOUS
  Administered 2023-03-31: 5 [IU] via SUBCUTANEOUS
  Administered 2023-03-31 – 2023-04-01 (×3): 2 [IU] via SUBCUTANEOUS
  Administered 2023-04-01 (×2): 3 [IU] via SUBCUTANEOUS
  Administered 2023-04-02: 2 [IU] via SUBCUTANEOUS
  Administered 2023-04-02: 3 [IU] via SUBCUTANEOUS

## 2023-03-19 NOTE — TOC Initial Note (Addendum)
Transition of Care [facility]) - Initial/Assessment Note    Patient Details  Name: Kathy Thomas MRN: [id number] Date of Birth: November 30, 1934  [facility] CM/SW Contact:    Harriett Sine, RN Phone Number: 03/19/2023, 12:00 PM  Clinical Narrative:                 Pt from [facility], pt has pcp. Spoke with at bedside about d/c plans. Pt states she will be returning to [facility] and gave permission to [facility] to speak with family Kathy Thomas [phone]. No SDOH needs at this time. TOC following  [employer] contacted for NIV for BIPAP.   Expected Discharge Plan: Skilled Nursing Facility Barriers to Discharge: Continued Medical Work up   Patient Goals and CMS Choice Patient states their goals for this hospitalization and ongoing recovery are:: To return back to [facility] CMS Medicare.gov Compare Post Acute Care list provided to:: Patient Represenative (must comment) (Patient's daughter Kathy Thomas) Choice offered to / list presented to : Adult Children [place] ownership interest in [facility].provided to:: Adult Children    Expected Discharge Plan and Services     Post Acute Care Choice: Skilled Nursing Facility Living arrangements for the past 2 months: Skilled Nursing Facility                                      Prior Living Arrangements/Services Living arrangements for the past 2 months: Skilled Nursing Facility Lives with:: Facility Resident Patient language and need for interpreter reviewed:: Yes Do you feel safe going back to the place where you live?: Yes      Need for Family Participation in Patient Care: Yes (Comment) Care giver support system in place?: Yes (comment)   Criminal Activity/Legal Involvement Pertinent to Current Situation/Hospitalization: No - Comment as needed  Activities of Daily Living   ADL Screening (condition at time of admission) Independently performs ADLs?: No Does the patient have a NEW difficulty with  bathing/dressing/toileting/self-feeding that is expected to last >3 days?: No Does the patient have a NEW difficulty with getting in/out of bed, walking, or climbing stairs that is expected to last >3 days?: No Does the patient have a NEW difficulty with communication that is expected to last >3 days?: No Is the patient deaf or have difficulty hearing?: No Does the patient have difficulty seeing, even when wearing glasses/contacts?: No Does the patient have difficulty concentrating, remembering, or making decisions?: Yes  Permission Sought/Granted Permission sought to share information with : Case Manager, Magazine features editor, Family Supports Permission granted to share information with : Yes, Release of Information Signed, Yes, Verbal Permission Granted  Share Information with NAME: Kathy Thomas Daughter   [phone]  Kathy Thomas Granddaughter   [phone]  Permission granted to share info w AGENCY: SNF admissions        Emotional Assessment Appearance:: Appears stated age Attitude/Demeanor/Rapport: Other (comment) Affect (typically observed): Accepting, Calm, Stable Orientation: : Oriented to Self Alcohol / Substance Use: Not Applicable Psych Involvement: No (comment)  Admission diagnosis:  Community acquired pneumonia [J18.9] Dyspnea, unspecified type [R06.00] Patient Active Problem List   Diagnosis Date Noted   Pressure injury of skin 03/17/2023   Hyperkalemia 03/17/2023   AKI (acute kidney injury) (HCC) 03/17/2023   DVT (deep venous thrombosis) (HCC) 03/17/2023   Oral thrush 03/17/2023   Pulmonary embolism (HCC) 03/16/2023   Cavitary pneumonia 03/14/2023   Aortic atherosclerosis (  HCC) 08/14/2020   Encounter for well adult exam with abnormal findings 08/14/2020   Submandibular gland infection 03/15/2020   Pneumonia due to COVID-19 virus 02/01/2020   Acute respiratory failure with hypoxia (HCC) 02/01/2020   Protein calorie malnutrition (HCC) 02/01/2020    Thrombocytopenia (HCC) 02/01/2020   Encounter for examination for admission to assisted living facility 10/20/2019   Pain due to onychomycosis of toenails of both feet 12/20/2018   Diabetes mellitus without complication (HCC) 12/20/2018   Diarrhea 03/07/2018   Acute right ankle pain 04/08/2017   Weight loss 06/25/2016   Iron deficiency 12/30/2015   Generalized weakness 07/08/2014   Diastolic CHF (HCC) 07/07/2014   Vitamin B12 deficiency 07/06/2014   Left fibular fracture 07/05/2014   Closed fibular fracture 07/05/2014   Left ankle pain 07/04/2014   Left knee pain 07/04/2014   Gait disorder 01/10/2014   Ruptured sebaceous cyst 10/16/2013   Dementia without behavioral disturbance (HCC) 09/08/2013   Left lumbar radiculopathy 10/28/2011   Grief reaction 10/28/2011   Insect bite, infected 10/28/2011   Preventative health care 12/06/2010   Osteoporosis 06/04/2010   ECCHYMOSES, SPONTANEOUS 06/04/2010   ANEMIA-IRON DEFICIENCY 10/26/2008   MENOPAUSAL DISORDER 10/26/2008   DM type 2 (diabetes mellitus, type 2) (HCC) 10/06/2007   Hyperlipidemia 10/06/2007   ANXIETY 10/06/2007   CHOLELITHIASIS 10/06/2007   SHOULDER PAIN, LEFT 10/06/2007   LOW BACK PAIN 01/29/2007   HTN (hypertension) 01/26/2007   GERD 01/26/2007   DIVERTICULOSIS, COLON 01/26/2007   HERNIATED DISC 01/26/2007   History of colonic polyps 01/26/2007   PCP:  Marden Noble, MD (Inactive) Pharmacy:   [facility] [address] [place] - [address] [facility] [place] [address] Phone: [phone] Fax: [phone]  [facility] - [place], [place] - [address] [place] [address] Phone: [phone] Fax: [phone]  [facility] [facility] [place]ita Halt, [facility] - [address] [place] [address] Phone: [phone] Fax: [phone]     Social Determinants of Health (SDOH) Social  History: SDOH Screenings   Food Insecurity: No Food Insecurity (03/14/2023)  Housing: Low Risk  (03/14/2023)  Transportation Needs: No Transportation Needs (03/14/2023)  Utilities: Not At Risk (03/14/2023)  Depression (PHQ2-9): Low Risk  (08/14/2020)  Tobacco Use: Medium Risk (03/14/2023)   SDOH Interventions:     Readmission Risk Interventions     No data to display

## 2023-03-19 NOTE — Progress Notes (Addendum)
NAME:  Kathy Thomas, MRN:  [id number], DOB:  1934-06-10, LOS: 5 ADMISSION DATE:  03/14/2023, CONSULTATION DATE:  10/22 REFERRING MD:  Dr. Rhona Leavens, CHIEF COMPLAINT: Cavitary pneumonia   History of Present Illness:  71F with PMH as below, which is significant for DM, HTN, dementia, and stroke. She resides in SNF where she was started on ertapenem for pneumonia after developing hypoxia and shortness of breath.  She did not have immediate improvement.  On 10/20 in the a.m. hours she was noted to be unresponsive with oxygen saturations in the 60s.  This was ultimately felt to be secondary to hypoglycemia and her responsiveness did somewhat improve with treatment.  She was brought to the emergency department for ongoing evaluation.  Workup for hypoxia included a CT scan of the chest which demonstrated cavitary lesion in the right middle lobe.  There was concern for PE versus atypical pneumonia.  VQ scan was done and was concerning for pulmonary embolism.  She was initiated on treatment with empiric antibiotics as well as systemic anticoagulation.  PCCM was consulted for cavitary pneumonia.  Pertinent  Medical History   has a past medical history of Acute sinusitis, unspecified (05/28/2008), ANEMIA-IRON DEFICIENCY (10/26/2008), ANXIETY (10/06/2007), CHOLELITHIASIS (10/06/2007), COLONIC POLYPS, HX OF (01/26/2007), DIABETES MELLITUS, TYPE II (10/06/2007), DIVERTICULOSIS, COLON (01/26/2007), ECCHYMOSES, SPONTANEOUS (06/04/2010), GERD (01/26/2007), Heart murmur, HERNIATED DISC (01/26/2007), HYPERLIPIDEMIA (10/06/2007), HYPERTENSION (01/26/2007), LOW BACK PAIN (01/29/2007), MENOPAUSAL DISORDER (10/26/2008), OSTEOPOROSIS (06/04/2010), SHOULDER PAIN, LEFT (10/06/2007), and Stroke (HCC).   Significant Hospital Events: Including procedures, antibiotic start and stop dates in addition to other pertinent events   10/20 admit for pneumonia 10/21 VQ + for PE 10/22 PCCM consult for cavitary pneumonia 10/23 tx to SDU for BiPAP in the setting  of lethargy and hypercarbia 10/24 Tolerated BiPAP most of the night. More wakeful this morning 10/25 no acute events overnight, utilize BiPAP  Interim History / Subjective:  Alert but altered this a.m. Daughter at bedside and updated  Objective   Blood pressure 119/80, pulse (!) 59, temperature 97.9 F (36.6 C), temperature source Axillary, resp. rate 15, height 5\' 1"  (1.549 m), weight 119.8 kg, SpO2 96%.    FiO2 (%):  [32 %-36 %] 32 %   Intake/Output Summary (Last 24 hours) at 03/19/2023 0841 Last data filed at 03/19/2023 0600 Gross per 24 hour  Intake 1889.84 ml  Output 1400 ml  Net 489.84 ml   Filed Weights   03/14/23 1803 03/17/23 1800  Weight: 116.6 kg 119.8 kg    Examination:  General: Acute on chronic ill-appearing elderly female lying in bed in no acute distress HEENT: [place]/AT, MM pink/moist, PERRL,  Neuro: Alert and oriented to self, confused to situation and time CV: s1s2 regular rate and rhythm, no murmur, rubs, or gallops,  PULM: Diminished bilaterally, no increased work of breathing, no added breath sounds, on 3 L nasal cannula GI: soft, bowel sounds active in all 4 quadrants, non-tender, non-distended Extremities: warm/dry, no edema  Skin: no rashes or lesions  Resolved Hospital Problem list     Assessment & Plan:  Acute respiratory failure with hypercarbia -10/23 ABG with pH 7.3 and CO2 in the 60s.  Overnight staff noted apneic periods despite BiPAP. She likely has undiagnosed sleep disordered breathing.  Cavitary pneumonia -Radiologist read concerning for pulmonary infarct from PE or atypical pneumonia. Daughter describes possible aspiration. V/Q c/w PE as well.   Pulmonary embolism/DVT L popliteal -Patient sits in wheelchair all day at facility per daughter P: Continue empiric antibiotics x  4 weeks of total therapy Follow sputum culture Will need repeat outpatient imaging Aspiration precautions Will likely need NIV at discharge Continue heparin  transition to DOAC per primary,  Wean steroids to oral prednisone   Goals of care:  - Discussed with daughter. Patient has previously been DNR/DNI. Family would like to keep full code for now. Discussed pros/cons of code status changes a couple times over the past few days. May be reasonable to involve palliative care team if family is supportive.  Per primary: AKI HTN Dementia Hypoglycemia   Best Practice (right click and "Reselect all SmartList Selections" daily)   Per primary  Signature:   Reaghan Kawa D. Harris, NP-C [facility] Personal contact information can be found on Amion  If no contact or response made please call [phone] 03/19/2023, 8:45 AM

## 2023-03-19 NOTE — Plan of Care (Signed)
?  Problem: Education: ?Goal: Knowledge of General Education information will improve ?Description: Including pain rating scale, medication(s)/side effects and non-pharmacologic comfort measures ?Outcome: Progressing ?  ?Problem: Health Behavior/Discharge Planning: ?Goal: Ability to manage health-related needs will improve ?Outcome: Progressing ?  ?Problem: Clinical Measurements: ?Goal: Ability to maintain clinical measurements within normal limits will improve ?Outcome: Progressing ?Goal: Respiratory complications will improve ?Outcome: Progressing ?Goal: Cardiovascular complication will be avoided ?Outcome: Progressing ?  ?Problem: Nutrition: ?Goal: Adequate nutrition will be maintained ?Outcome: Progressing ?  ?Problem: Coping: ?Goal: Level of anxiety will decrease ?Outcome: Progressing ?  ?Problem: Elimination: ?Goal: Will not experience complications related to bowel motility ?Outcome: Progressing ?Goal: Will not experience complications related to urinary retention ?Outcome: Progressing ?  ?Problem: Pain Managment: ?Goal: General experience of comfort will improve ?Outcome: Progressing ?  ?Problem: Safety: ?Goal: Ability to remain free from injury will improve ?Outcome: Progressing ?  ?Problem: Skin Integrity: ?Goal: Risk for impaired skin integrity will decrease ?Outcome: Progressing ?  ?

## 2023-03-19 NOTE — Evaluation (Signed)
Occupational Therapy Evaluation Patient Details Name: Kathy Thomas MRN: [id number] DOB: 29-Aug-1934 Today's Date: 03/19/2023   History of Present Illness 88 yr old. female with medical history significant of dementia, hypertension who presented to the emergency department due to shortness of breath.  Patient was found to be hypoxic 3 days ago and underwent chest x-ray which showed concern for community-acquired pneumonia.   By the nursing facility she developed tachypnea and shortness of breath so EMS was called to bring her to the emergency department further assessment.  On EMS arrival she was found to be hypoglycemic.  She was brought to the ER where she was hypoxic requiring up to 6 L nasal cannula. Pt was admitted for concerns of cavitary PNA   Clinical Impression   The pt is currently limited by the below listed deficits, which compromise her overall ADL performance (see OT problem list). During the session today, she required max assist x2 to achieve sitting EOB. Once seated EOB, she presented with intermittent L sided lean, requiring min-mod assist for sitting balance. At current, she requires significant for self-care tasks, such as dressing and toileting at bed level. She will benefit from further OT services in the acute care setting to facilitate improved ADL performance and to decrease the risk for further weakness and deconditioning.       If plan is discharge home, recommend the following: Direct supervision/assist for medications management;Two people to help with walking and/or transfers;A lot of help with bathing/dressing/bathroom    Functional Status Assessment  Patient has had a recent decline in their functional status and demonstrates the ability to make significant improvements in function in a reasonable and predictable amount of time.  Equipment Recommendations  None recommended by OT    Recommendations for Other Services       Precautions / Restrictions  Precautions Precautions: Fall Precaution Comments: Restrictions Weight Bearing Restrictions: No Other Position/Activity Restrictions: monitor sats      Mobility Bed Mobility Overal bed mobility: Needs Assistance Bed Mobility: Rolling, Supine to Sit, Sit to Supine Rolling: +2 for physical assistance, +2 for safety/equipment, Max assist   Supine to sit: Max assist, +2 for physical assistance, +2 for safety/equipment, HOB elevated Sit to supine: +2 for safety/equipment, +2 for physical assistance, Total assist   General bed mobility comments: patient  does assist with cueing to reach for rails, did move legs  some toward bed edge. Assisted trunk and legs to sit up and return to supine    Transfers  General transfer comment: not assessed      Balance     Sitting balance-Leahy Scale: Poor Sitting balance - Comments: sits EOB however leans to left, close guard to prevent forward flexion, sat EOB approximately 10 mins, does follow simple directions for  shifting weight        ADL either performed or assessed with clinical judgement   ADL Overall ADL's : Needs assistance/impaired Eating/Feeding: Bed level;Minimal assistance Eating/Feeding Details (indicate cue type and reason): based on clinical judgement Grooming: Minimal assistance;Moderate assistance;Bed level Grooming Details (indicate cue type and reason): based on clinical judgement         Upper Body Dressing : Moderate assistance;Bed level Upper Body Dressing Details (indicate cue type and reason): based on clinical judgement Lower Body Dressing: Total assistance;Bed level Lower Body Dressing Details (indicate cue type and reason): based on clinical judgement     Toileting- Clothing Manipulation and Hygiene: Maximal assistance;Total assistance;Bed level Toileting - Clothing Manipulation Details (indicate cue type  and reason): based on clinical judgement                          Pertinent Vitals/Pain Pain  Assessment Pain Assessment: No/denies pain     Extremity/Trunk Assessment Upper Extremity Assessment Upper Extremity Assessment: Right hand dominant;LUE deficits/detail;RUE deficits/detail;Generalized weakness RUE Deficits / Details: AROM WFL. Functional grip strength LUE Deficits / Details: AROM WFL. Functional grip strength   Lower Extremity Assessment Lower Extremity Assessment: Generalized weakness      Communication Communication Communication: No apparent difficulties   Cognition Arousal: Alert Behavior During Therapy: WFL for tasks assessed/performed Overall Cognitive Status: History of cognitive impairments - at baseline          General Comments: patient able to follow simple directions; oriented to self, oriented to being in [place], disoriented to time and situation                Home Living Family/patient expects to be discharged to:: Skilled nursing facility (long-term care resident at [facility] for 1.5 yrs)              Prior Functioning/Environment Prior Level of Function : Needs assist  Cognitive Assist : Mobility (cognitive);ADLs (cognitive) Mobility (Cognitive): Step by step cues ADLs (Cognitive): Step by step cues Physical Assist : Mobility (physical) Mobility (physical): Bed mobility;Transfers   Mobility Comments: daughter reports that patient was able to stand-pivot to and from wheelchair with assist from staff; when last able to ambulate using a RW and with staff assist, daughter thought not too long ago but uncertain of exact time frame ADLs Comments: Pt was independent with feeding, however required assist for dressing, bathing, and toileting.         OT Problem List: Decreased strength;Decreased activity tolerance;Impaired balance (sitting and/or standing);Decreased knowledge of use of DME or AE;Cardiopulmonary status limiting activity;Increased edema      OT Treatment/Interventions: Self-care/ADL training;Therapeutic  exercise;Energy conservation;DME and/or AE instruction;Therapeutic activities;Cognitive remediation/compensation;Patient/family education;Balance training    OT Goals(Current goals can be found in the care plan section) Acute Rehab OT Goals OT Goal Formulation: With patient/family Time For Goal Achievement: 04/02/23 Potential to Achieve Goals: Fair ADL Goals Pt Will Perform Grooming: with set-up;with supervision;sitting Pt Will Perform Upper Body Dressing: with min assist;sitting Pt Will Transfer to Toilet: with min assist;stand pivot transfer;bedside commode Additional ADL Goal #1: The pt will perform bed mobility with min assist, in prep for progressive ADL participation.  OT Frequency: Min 1X/week    Co-evaluation PT/OT/SLP Co-Evaluation/Treatment: Yes Reason for Co-Treatment: To address functional/ADL transfers;For patient/therapist safety PT goals addressed during session: Mobility/safety with mobility OT goals addressed during session: ADL's and self-care      AM-PAC OT "6 Clicks" Daily Activity     Outcome Measure Help from another person eating meals?: A Little Help from another person taking care of personal grooming?: A Lot Help from another person toileting, which includes using toliet, bedpan, or urinal?: Total Help from another person bathing (including washing, rinsing, drying)?: A Lot Help from another person to put on and taking off regular upper body clothing?: A Lot Help from another person to put on and taking off regular lower body clothing?: Total 6 Click Score: 11   End of Session Equipment Utilized During Treatment: Oxygen Nurse Communication: Mobility status  Activity Tolerance: Patient limited by fatigue Patient left: in bed;with call bell/phone within reach;with bed alarm set;with family/visitor present  OT Visit Diagnosis: Muscle weakness (generalized) (M62.81)  Time: 1110-1145 OT Time Calculation (min): 35 min Charges:  OT General  Charges $OT Visit: 1 Visit OT Evaluation $OT Eval Moderate Complexity: 1 Mod    Helvi Royals L Jessie Schrieber, OTR/L 03/19/2023, 1:53 PM

## 2023-03-19 NOTE — Evaluation (Signed)
Physical Therapy Evaluation Patient Details Name: Kathy Thomas MRN: [id number] DOB: 01/07/1935 Today's Date: 03/19/2023  History of Present Illness  88 y.o. female with medical history significant of dementia, hypertension who presented to the emergency department due to shortness of breath.  Patient was found to be hypoxic 3 days ago and underwent chest x-ray which showed concern for community-acquired pneumonia.  She was started on ertapenem and received 3 doses.  By the nursing facility she developed tachypnea and shortness of breath so EMS was called to bring her to the emergency department further assessment.  On EMS arrival she was found to be hypoglycemic.  She was brought to the ER where she was hypoxic requiring up to 6 L nasal cannula. Pt was admitted for concerns of cavitary PNA  Clinical Impression    Pt admitted with above diagnosis.  Pt currently with functional limitations due to the deficits listed below (see PT Problem List). Pt will benefit from acute skilled PT to increase their independence and safety with mobility to allow discharge.   The patient's daughter is present to provide information. Patient  comes from  [facility], > 1 year. Per daughter, reports that patient was able to to trnasfer with assistance to a WC. Unsure if ambulatory recently.  Plans are  to return to SNF/LTC.  Patient  is alert,  oriented to self, and able to follow simple  directions, actually joking comments at times.  Patient required max/total assist for  mobility to sitting. Not deemed safe to attempt standing from  bed due to  weakness and body habitus and stature. Patient remained on 4 L , SPO2 > 92%. Not SOB with mobility.    If plan is discharge home, recommend the following: A lot of help with bathing/dressing/bathroom;Direct supervision/assist for medications management;Two people to help with walking and/or transfers;Two people to help with bathing/dressing/bathroom   Can travel by private  vehicle   No    Equipment Recommendations None recommended by PT  Recommendations for Other Services       Functional Status Assessment Patient has had a recent decline in their functional status and/or demonstrates limited ability to make significant improvements in function in a reasonable and predictable amount of time     Precautions / Restrictions Precautions Precautions: Fall Precaution Comments: body habitus, monitor sats Restrictions Weight Bearing Restrictions: No      Mobility  Bed Mobility Overal bed mobility: Needs Assistance Bed Mobility: Rolling, Supine to Sit, Sit to Supine Rolling: +2 for physical assistance, +2 for safety/equipment, Max assist   Supine to sit: Max assist, +2 for physical assistance, +2 for safety/equipment, HOB elevated Sit to supine: +2 for safety/equipment, +2 for physical assistance, Total assist   General bed mobility comments: patient  does assist with cueing to reach for rails, did move legs  some toward bed edge. Assisted trunk and legs to sit up and return to supine    Transfers                   General transfer comment: NT    Ambulation/Gait                  Stairs            Wheelchair Mobility     Tilt Bed    Modified Rankin (Stroke Patients Only)       Balance Overall balance assessment: Needs assistance Sitting-balance support: Bilateral upper extremity supported, Feet supported Sitting balance-Leahy Scale: Poor Sitting balance -  Comments: sits   leans to left, clse guard to prevent forward flexion, sat x ~ 10 mins, engaged in joking , does follow simple directions for  shifting weight                                     Pertinent Vitals/Pain Pain Assessment Pain Assessment: No/denies pain    Home Living Family/patient expects to be discharged to:: Skilled nursing facility                   Additional Comments: patient has been at [facility] 1.5 yrs    Prior  Function Prior Level of Function : Needs assist  Cognitive Assist : Mobility (cognitive);ADLs (cognitive) Mobility (Cognitive): Step by step cues ADLs (Cognitive): Step by step cues Physical Assist : Mobility (physical) Mobility (physical): Bed mobility;Transfers   Mobility Comments: dtr reports that patient was able to pivot to WC PTA, ? when last able to  ambulate, dtr thought not too long ago but uncertain ADLs Comments: staff performed with assistnace     Extremity/Trunk Assessment        Lower Extremity Assessment Lower Extremity Assessment: Generalized weakness    Cervical / Trunk Assessment Cervical / Trunk Assessment: Other exceptions Cervical / Trunk Exceptions: body habitus  Communication   Communication Communication: No apparent difficulties  Cognition Arousal: Alert   Overall Cognitive Status: History of cognitive impairments - at baseline                                 General Comments: patient able to follow simple directions  oriented to self        General Comments      Exercises     Assessment/Plan    PT Assessment Patient needs continued PT services  PT Problem List Decreased strength;Decreased balance;Decreased cognition;Decreased knowledge of precautions;Cardiopulmonary status limiting activity;Decreased mobility;Obesity       PT Treatment Interventions DME instruction;Therapeutic activities;Cognitive remediation;Gait training;Patient/family education;Functional mobility training;Balance training    PT Goals (Current goals can be found in the Care Plan section)  Acute Rehab PT Goals Patient Stated Goal: to get better PT Goal Formulation: With family Time For Goal Achievement: 04/02/23 Potential to Achieve Goals: Fair    Frequency Min 1X/week     Co-evaluation PT/OT/SLP Co-Evaluation/Treatment: Yes Reason for Co-Treatment: To address functional/ADL transfers;For patient/therapist safety PT goals addressed during  session: Mobility/safety with mobility OT goals addressed during session: ADL's and self-care       AM-PAC PT "6 Clicks" Mobility  Outcome Measure                  End of Session   Activity Tolerance: Patient tolerated treatment well Patient left: in bed;with bed alarm set;with call bell/phone within reach;with family/visitor present Nurse Communication: Mobility status;Need for lift equipment (sky) PT Visit Diagnosis: Other abnormalities of gait and mobility (R26.89)    Time: 1110-1145 PT Time Calculation (min) (ACUTE ONLY): 35 min   Charges:   PT Evaluation $PT Eval Low Complexity: 1 Low   PT General Charges $$ ACUTE PT VISIT: 1 Visit        Blanchard Kelch PT Acute Rehabilitation Services Office [phone] Weekend [phone]   Rada Hay 03/19/2023, 1:25 PM

## 2023-03-19 NOTE — TOC Progression Note (Signed)
Transition of Care [facility]) - Progression Note    Patient Details  Name: Kathy Thomas MRN: [id number] Date of Birth: Sep 28, 1934  Transition of Care [facility]) CM/SW Contact  Harriett Sine, RN Phone Number: 03/19/2023, 2:59 PM  Clinical Narrative:     Lucien Mons representative with [employer] referred NCM to [facility]. Receptionist asked for a Rx for the DME to be faxed to office and they would let us know. Called [place] with [employer] [phone] on DME , left message.     Expected Discharge Plan: Skilled Nursing Facility Barriers to Discharge: No Barriers Identified  Expected Discharge Plan and Services In-house Referral: NA Discharge Planning Services: NA Post Acute Care Choice: Skilled Nursing Facility Living arrangements for the past 2 months: Skilled Nursing Facility                 DME Arranged: [facility] DME Agency: [employer] Date DME Agency Contacted: 03/19/23 Time DME Agency Contacted: 1213 Representative spoke with at DME Agency: Vaughan Basta HH Arranged:  (NA)           Social Determinants of Health (SDOH) Interventions SDOH Screenings   Food Insecurity: No Food Insecurity (03/14/2023)  Housing: Low Risk  (03/14/2023)  Transportation Needs: No Transportation Needs (03/14/2023)  Utilities: Not At Risk (03/14/2023)  Depression (PHQ2-9): Low Risk  (08/14/2020)  Tobacco Use: Medium Risk (03/14/2023)    Readmission Risk Interventions     No data to display

## 2023-03-19 NOTE — Progress Notes (Signed)
PROGRESS NOTE    Kathy Thomas  DDU:[id number] DOB: Oct 03, 1934 DOA: 03/14/2023 PCP: Marden Noble, MD (Inactive)   Chief Complaint  Patient presents with   Hypoglycemia   Shortness of Breath    Brief Narrative:  88 y.o. female with medical history significant of dementia, hypertension who presented to the emergency department due to shortness of breath.  Patient was found to be hypoxic 3 days ago and underwent chest x-ray which showed concern for community-acquired pneumonia.  She was started on ertapenem and received 3 doses.  By the nursing facility she developed tachypnea and shortness of breath so EMS was called to bring her to the emergency department further assessment.  On EMS arrival she was found to be hypoglycemic.  She was brought to the ER where she was hypoxic requiring up to 6 L nasal cannula. Pt was admitted for concerns of cavitary PNA    Assessment & Plan:   Principal Problem:   Cavitary pneumonia Active Problems:   Pulmonary embolism (HCC)   DVT (deep venous thrombosis) (HCC)   DM type 2 (diabetes mellitus, type 2) (HCC)   HTN (hypertension)   Dementia without behavioral disturbance (HCC)   Diabetes mellitus without complication (HCC)   Pressure injury of skin   Hyperkalemia   AKI (acute kidney injury) (HCC)   Oral thrush   #1 cavitary pneumonia -Patient noted to have presented with altered mental status, tachypnea, worsening shortness of breath and noted to have been hypoxic 3 days prior to admission underwent chest x-ray concerning for community-acquired pneumonia. -On arrival to the ED patient noted to be hypoxic requiring up to 6 L nasal cannula and admitted for concerns for cavitary pneumonia. -CT chest done with findings notable for a large masslike consolidation of the RML with central groundglass, cavitation, peripheral ring of dense consolidation concerning for pulmonary infarct versus atypical cavitary pneumonia.  Small right pleural effusion  noted. -Patient noted to have completed ertapenem prior to admission. -Patient drowsy/lethargic the evening of 03/16/2023 and in the morning of 03/17/2023 noted overnight. -Patient was placed on BiPAP 03/17/2023 and overnight with clinical improvement currently on 3 to 4 L nasal cannula with sats of 96%. -Status post IV albumin and IV Lasix 03/18/2023 with good urine output. -Patient seen in consultation by pulmonary who assessed the patient and recommending continuation of antibiotics of IV Rocephin and Flagyl and recommended total of 4 to 6 weeks of antibiotics await reimaging in the outpatient setting. -Patient assessed by SLP. -Supportive care.  2.  PE/left popliteal DVT -Noted on V/Q scan with findings confirming PE. -Lower extremity Dopplers with left lower extremity DVT. -2D echo with EF of 55 to 60%, mild concentric LVH, grade 1 DD, full study not completed as patient noted to have refused exam. -Was on IV heparin has been transition to full dose Lovenox, if patient continues to be more clinically improved and alert could likely change to a DOAC in the next 24-48 hours.  -Per pulmonary likely need lifelong DOAC.  3.  Acute metabolic encephalopathy/acute respiratory failure with hypercarbia -Likely multifactorial secondary to hypercarbia in the setting of acute illness of cavitary pneumonia, with prior history of tobacco use. -Patient with no focal neurological deficits however daughter concerned due to patient's family history and current mental status concern for possible CVA. -CT head done on admission negative for any acute abnormalities. -Repeat head CT negative for any acute abnormalities.. -Ammonia levels slightly elevated at 41 and trending down with lactulose. -ABG with a pH of  7.3/pCO2 of 66/pO2 of 73/bicarb of 32. -Patient with clinical improvement after being placed on BiPAP yesterday with repeat ABG with a pH of 7.29/pCO2 of 64/pO2 of 66 with a bicarb of 31. -Continue  lactulose twice daily today and discontinue tomorrow. -BiPAP nightly and as needed. -Continue treatment as in problem #1 and 2. -Continue Pulmicort, Flonase, scheduled DuoNebs, Claritin. -IV Solu-Medrol has been transition to oral prednisone.  Continue PPI.  -BiPAP as needed and nightly. -Patient likely with probable OSA. -Pulmonary following and feel patient would likely need  NIVM at discharge.  4.  Hyperkalemia -Resolved with Lokelma, potassium of 4.7 this morning.   -DC Lokelma.   -Repeat labs in the AM.   5.  AKI -Likely secondary to prerenal azotemia in the setting of ARB, diuretics.  Patient also noted to be on Jardiance prior to admission. -Urine output of 2.1 L over the past 24 hours after receiving IV albumin and Lasix 40 mg IV x 1 on 03/18/2023.. -Renal function improved.   -Saline lock IV fluids.  -Continue to hold ARB and diuretics.  6.  Oral thrush -Nystatin swish and swallow.    7.  Hypertension -BP has improved.   -ARB, diuretics on hold. -IV hydralazine as needed. -Patient with some bradycardia this morning and as such IV Lopressor held.  -Discontinued IV Lopressor and start patient on Norvasc 5 mg daily.   8.  Cognitive impairment -Continue Aricept.  9.  Hypoglycemia/diabetes mellitus type 2 -Patient noted to have received insulin prior to admission at facility. -Hemoglobin A1c 7.1 (03/15/2023) -CBG noted at 204 this morning. -Saline lock IV fluids. -Continue to hold oral hypoglycemic agents of Amaryl and Jardiance. -Patient started on steroids and as such monitor CBGs. -Increase NovoLog correction scale to 0 to 9 units 3 times daily and at bedtime.  -Start Semglee 5 units daily and uptitrated as needed for better blood glucose control.   10.  Bilateral upper extremity swelling -Likely secondary to third spacing patient with hypoalbuminemia. -Status post IV albumin, Lasix 40 mg IV x 1 with significant improvement with upper extremity swelling.   -Saline  lock IV fluids.  11.  Pressure injury buttock stage II, POA Pressure Injury 03/14/23 Buttocks Stage 2 -  Partial thickness loss of dermis presenting as a shallow open injury with a red, pink wound bed without slough. (Active)  03/14/23 1830  Location: Buttocks  Location Orientation:   Staging: Stage 2 -  Partial thickness loss of dermis presenting as a shallow open injury with a red, pink wound bed without slough.  Wound Description (Comments):   Present on Admission: Yes       DVT prophylaxis: Heparin>>>>> Lovenox Code Status: Full Family Communication: Updated daughter at bedside. Disposition: Remain in SDU.  Likely back to SNF when medically stable.    Status is: Inpatient Remains inpatient appropriate because: Severity of illness   Consultants:  PCCM: Dr. Isaiah Serge 03/16/2023  Procedures:  CT head 03/14/2023, 03/17/2023 CT chest 03/14/2023 Left upper extremity Dopplers 03/16/2023 Lower extremity Dopplers 03/16/2023 2D echo 03/15/2023  Antimicrobials:  Anti-infectives (From admission, onward)    Start     Dose/Rate Route Frequency Ordered Stop   03/16/23 2200  cefTRIAXone (ROCEPHIN) 2 g in sodium chloride 0.9 % 100 mL IVPB        2 g 200 mL/hr over 30 Minutes Intravenous Every 24 hours 03/16/23 1322     03/16/23 1400  metroNIDAZOLE (FLAGYL) IVPB 500 mg        500 mg  100 mL/hr over 60 Minutes Intravenous Every 12 hours 03/16/23 1322     03/16/23 0915  ceFEPIme (MAXIPIME) 2 g in sodium chloride 0.9 % 100 mL IVPB  Status:  Discontinued        2 g 200 mL/hr over 30 Minutes Intravenous Every 12 hours 03/16/23 0904 03/16/23 1322   03/15/23 1800  ceFEPIme (MAXIPIME) 2 g in sodium chloride 0.9 % 100 mL IVPB  Status:  Discontinued        2 g 200 mL/hr over 30 Minutes Intravenous Every 24 hours 03/14/23 2010 03/16/23 0904   03/14/23 1954  vancomycin variable dose per unstable renal function (pharmacist dosing)  Status:  Discontinued         Does not apply See admin instructions  03/14/23 1954 03/15/23 0744   03/14/23 1930  vancomycin (VANCOCIN) IVPB 1000 mg/200 mL premix        1,000 mg 200 mL/hr over 60 Minutes Intravenous  Once 03/14/23 1842 03/15/23 0700   03/14/23 1700  vancomycin (VANCOCIN) IVPB 1000 mg/200 mL premix        1,000 mg 200 mL/hr over 60 Minutes Intravenous  Once 03/14/23 1652 03/15/23 0700   03/14/23 1700  ceFEPIme (MAXIPIME) 2 g in sodium chloride 0.9 % 100 mL IVPB        2 g 200 mL/hr over 30 Minutes Intravenous  Once 03/14/23 1652 03/15/23 0700         Subjective: Sitting up in bed.  Alert.  Daughter at bedside.  Noted to tolerate BiPAP overnight.  Required BiPAP intermittently during the day yesterday.  Denies any chest pain or shortness of breath.    Objective: Vitals:   03/19/23 1400 03/19/23 1530 03/19/23 1604 03/19/23 1800  BP: (!) 161/49  (!) 138/28 (!) 134/36  Pulse: 63 75 70 (!) 59  Resp: 20 (!) 26 20 (!) 22  Temp:      TempSrc:      SpO2: 95% 92% 94% 92%  Weight:      Height:        Intake/Output Summary (Last 24 hours) at 03/19/2023 1827 Last data filed at 03/19/2023 1813 Gross per 24 hour  Intake 1339.88 ml  Output 1050 ml  Net 289.88 ml   Filed Weights   03/14/23 1803 03/17/23 1800  Weight: 116.6 kg 119.8 kg    Examination:  General exam: Alert.  Following commands.  Oral thrush.  Respiratory system: Scattered coarse breath sounds.  No wheezing.  Fair air movement.  Speaking in full sentences.   Cardiovascular system: RRR no murmurs rubs or gallops.  No JVD.  No lower extremity edema.  Gastrointestinal system: Abdomen is soft, obese, nontender, nondistended, positive bowel sounds.  No rebound.  No guarding.  Central nervous system: Alert and oriented.  Moving extremities spontaneously.  No focal neurological deficits. Extremities: Bilateral upper extremities with significantly improved edema.  No significant pitting lower extremity edema. Skin: Ecchymosis on left hand and right hand.  Psychiatry: Judgement  and insight fair.  Mood and affect appropriate.     Data Reviewed: I have personally reviewed following labs and imaging studies  CBC: Recent Labs  Lab 03/14/23 1350 03/15/23 0338 03/16/23 0419 03/17/23 0449 03/18/23 0335 03/19/23 0318  WBC 13.9* 8.2 8.7 7.8 5.4 9.5  NEUTROABS 12.3*  --   --   --   --   --   HGB 12.1 12.9 11.7* 13.2 11.9* 11.8*  HCT 42.7 45.2 42.0 47.7* 44.0 42.9  MCV 90.5 88.5 91.1  91.4 92.6 93.5  PLT 220 206 211 183 218 229    Basic Metabolic Panel: Recent Labs  Lab 03/16/23 0419 03/17/23 0449 03/18/23 0335 03/18/23 0347 03/18/23 0845 03/19/23 0318  NA 134* 138 139 140  --  145  K 5.6* 5.3* 5.6* 5.5* 5.9* 4.7  CL 99 104 107 106  --  110  CO2 25 26 25 27   --  28  GLUCOSE 65* 114* 279* 280*  --  281*  BUN 43* 37* 32* 32*  --  39*  CREATININE 1.18* 0.99 0.96 0.96  --  0.95  CALCIUM 8.0* 8.1* 8.1* 8.2*  --  8.1*  MG  --  2.7*  --   --   --  2.5*  PHOS  --   --   --  3.5  --   --     GFR: Estimated Creatinine Clearance: 52.4 mL/min (by C-G formula based on SCr of 0.95 mg/dL).  Liver Function Tests: Recent Labs  Lab 03/14/23 1457 03/16/23 0419 03/17/23 0449 03/18/23 0347  AST 14* 10* 12*  --   ALT 15 12 13   --   ALKPHOS 64 55 55  --   BILITOT 0.5 0.6 0.4  --   PROT 6.5 6.2* 6.7  --   ALBUMIN 2.1* 2.0* 2.2* 2.1*    CBG: Recent Labs  Lab 03/18/23 1645 03/18/23 2212 03/19/23 0818 03/19/23 1136 03/19/23 1620  GLUCAP 225* 316* 204* 206* 242*     Recent Results (from the past 240 hour(s))  Resp panel by RT-PCR (RSV, Flu A&B, Covid) Anterior Nasal Swab     Status: None   Collection Time: 03/14/23  1:51 PM   Specimen: Anterior Nasal Swab  Result Value Ref Range Status   SARS Coronavirus 2 by RT PCR NEGATIVE NEGATIVE Final    Comment: (NOTE) SARS-CoV-2 target nucleic acids are NOT DETECTED.  The SARS-CoV-2 RNA is generally detectable in upper respiratory specimens during the acute phase of infection. The lowest concentration of  SARS-CoV-2 viral copies this assay can detect is 138 copies/mL. A negative result does not preclude SARS-Cov-2 infection and should not be used as the sole basis for treatment or other patient management decisions. A negative result may occur with  improper specimen collection/handling, submission of specimen other than nasopharyngeal swab, presence of viral mutation(s) within the areas targeted by this assay, and inadequate number of viral copies(<138 copies/mL). A negative result must be combined with clinical observations, patient history, and epidemiological information. The expected result is Negative.  Fact Sheet for Patients:  BloggerCourse.com  Fact Sheet for Healthcare Providers:  SeriousBroker.it  This test is no t yet approved or cleared by the [place] FDA and  has been authorized for detection and/or diagnosis of SARS-CoV-2 by FDA under an Emergency Use Authorization (EUA). This EUA will remain  in effect (meaning this test can be used) for the duration of the COVID-19 declaration under Section 564(b)(1) of the Act, 21 U.S.C.section 360bbb-3(b)(1), unless the authorization is terminated  or revoked sooner.       Influenza A by PCR NEGATIVE NEGATIVE Final   Influenza B by PCR NEGATIVE NEGATIVE Final    Comment: (NOTE) The Xpert Xpress SARS-CoV-2/FLU/RSV plus assay is intended as an aid in the diagnosis of influenza from Nasopharyngeal swab specimens and should not be used as a sole basis for treatment. Nasal washings and aspirates are unacceptable for Xpert Xpress SARS-CoV-2/FLU/RSV testing.  Fact Sheet for Patients: BloggerCourse.com  Fact Sheet for Healthcare Providers:  SeriousBroker.it  This test is not yet approved or cleared by the [place] and has been authorized for detection and/or diagnosis of SARS-CoV-2 by FDA under an Emergency Use  Authorization (EUA). This EUA will remain in effect (meaning this test can be used) for the duration of the COVID-19 declaration under Section 564(b)(1) of the Act, 21 U.S.C. section 360bbb-3(b)(1), unless the authorization is terminated or revoked.     Resp Syncytial Virus by PCR NEGATIVE NEGATIVE Final    Comment: (NOTE) Fact Sheet for Patients: BloggerCourse.com  Fact Sheet for Healthcare Providers: SeriousBroker.it  This test is not yet approved or cleared by the [place] FDA and has been authorized for detection and/or diagnosis of SARS-CoV-2 by FDA under an Emergency Use Authorization (EUA). This EUA will remain in effect (meaning this test can be used) for the duration of the COVID-19 declaration under Section 564(b)(1) of the Act, 21 U.S.C. section 360bbb-3(b)(1), unless the authorization is terminated or revoked.  Performed at [facility], [address] [place], [place] [address]   Culture, blood (routine x 2)     Status: None   Collection Time: 03/14/23  1:51 PM   Specimen: BLOOD  Result Value Ref Range Status   Specimen Description   Final    BLOOD RIGHT ANTECUBITAL Performed at [facility], [address] [place], [place] [address]    Special Requests   Final    BOTTLES DRAWN AEROBIC AND ANAEROBIC Blood Culture adequate volume Performed at [facility], [address] [place], [place] [address]    Culture   Final    NO GROWTH 5 DAYS Performed at [facility], [address] [place], [place] [address]    Report Status 03/19/2023 FINAL  Final  Culture, blood (routine x 2)     Status: Abnormal   Collection Time: 03/14/23  2:57 PM   Specimen: BLOOD RIGHT ARM  Result Value Ref Range Status   Specimen Description   Final    BLOOD RIGHT ARM Performed at [facility], [address] [place], [place] [address]    Special Requests    Final    BOTTLES DRAWN AEROBIC AND ANAEROBIC Blood Culture adequate volume Performed at [facility], [address], [place], [place] [address]    Culture  Setup Time   Final    GRAM POSITIVE COCCI IN CLUSTERS AEROBIC BOTTLE ONLY CRITICAL RESULT CALLED TO, READ BACK BY AND VERIFIED WITH: PHARMD LEANN POINDEXTER ON 03/18/23 @ 2233 BY DRT    Culture (A)  Final    STAPHYLOCOCCUS EPIDERMIDIS THE SIGNIFICANCE OF ISOLATING THIS ORGANISM FROM A SINGLE SET OF BLOOD CULTURES WHEN MULTIPLE SETS ARE DRAWN IS UNCERTAIN. PLEASE NOTIFY THE MICROBIOLOGY DEPARTMENT WITHIN ONE WEEK IF SPECIATION AND SENSITIVITIES ARE REQUIRED. Performed at [facility], [address] [place], [place] [address]    Report Status 03/19/2023 FINAL  Final  Blood Culture ID Panel (Reflexed)     Status: Abnormal   Collection Time: 03/14/23  2:57 PM  Result Value Ref Range Status   Enterococcus faecalis NOT DETECTED NOT DETECTED Final   Enterococcus Faecium NOT DETECTED NOT DETECTED Final   Listeria monocytogenes NOT DETECTED NOT DETECTED Final   Staphylococcus species DETECTED (A) NOT DETECTED Final    Comment: CRITICAL RESULT CALLED TO, READ BACK BY AND VERIFIED WITH: PHARMD LEANN POINDEXTER ON 03/18/23 @ 2233 BY DRT    Staphylococcus aureus (BCID) NOT DETECTED  NOT DETECTED Final   Staphylococcus epidermidis DETECTED (A) NOT DETECTED Final    Comment: Methicillin (oxacillin) resistant coagulase negative staphylococcus. Possible blood culture contaminant (unless isolated from more than one blood culture draw or clinical case suggests pathogenicity). No antibiotic treatment is indicated for blood  culture contaminants. CRITICAL RESULT CALLED TO, READ BACK BY AND VERIFIED WITH: PHARMD LEANN POINDEXTER ON 03/18/23 @ 2233 BY DRT    Staphylococcus lugdunensis NOT DETECTED NOT DETECTED Final   Streptococcus species NOT DETECTED NOT DETECTED Final   Streptococcus agalactiae NOT DETECTED NOT DETECTED  Final   Streptococcus pneumoniae NOT DETECTED NOT DETECTED Final   Streptococcus pyogenes NOT DETECTED NOT DETECTED Final   A.calcoaceticus-baumannii NOT DETECTED NOT DETECTED Final   Bacteroides fragilis NOT DETECTED NOT DETECTED Final   Enterobacterales NOT DETECTED NOT DETECTED Final   Enterobacter cloacae complex NOT DETECTED NOT DETECTED Final   Escherichia coli NOT DETECTED NOT DETECTED Final   Klebsiella aerogenes NOT DETECTED NOT DETECTED Final   Klebsiella oxytoca NOT DETECTED NOT DETECTED Final   Klebsiella pneumoniae NOT DETECTED NOT DETECTED Final   Proteus species NOT DETECTED NOT DETECTED Final   Salmonella species NOT DETECTED NOT DETECTED Final   Serratia marcescens NOT DETECTED NOT DETECTED Final   Haemophilus influenzae NOT DETECTED NOT DETECTED Final   Neisseria meningitidis NOT DETECTED NOT DETECTED Final   Pseudomonas aeruginosa NOT DETECTED NOT DETECTED Final   Stenotrophomonas maltophilia NOT DETECTED NOT DETECTED Final   Candida albicans NOT DETECTED NOT DETECTED Final   Candida auris NOT DETECTED NOT DETECTED Final   Candida glabrata NOT DETECTED NOT DETECTED Final   Candida krusei NOT DETECTED NOT DETECTED Final   Candida parapsilosis NOT DETECTED NOT DETECTED Final   Candida tropicalis NOT DETECTED NOT DETECTED Final   Cryptococcus neoformans/gattii NOT DETECTED NOT DETECTED Final   Methicillin resistance mecA/C DETECTED (A) NOT DETECTED Final    Comment: CRITICAL RESULT CALLED TO, READ BACK BY AND VERIFIED WITH: PHARMD LEANN POINDEXTER ON 03/18/23 @ 2233 BY DRT Performed at [facility], [address] [place], [place] [address]   MRSA Next Gen by PCR, Nasal     Status: None   Collection Time: 03/14/23 10:15 PM   Specimen: Nasal Mucosa; Nasal Swab  Result Value Ref Range Status   MRSA by PCR Next Gen NOT DETECTED NOT DETECTED Final    Comment: (NOTE) The GeneXpert MRSA Assay (FDA approved for NASAL specimens only), is one component of a  comprehensive MRSA colonization surveillance program. It is not intended to diagnose MRSA infection nor to guide or monitor treatment for MRSA infections. Test performance is not FDA approved in patients less than 43 years old. Performed at [facility], [address], [place], [place] [address]   Urine Culture (for pregnant, neutropenic or urologic patients or patients with an indwelling urinary catheter)     Status: Abnormal   Collection Time: 03/17/23  3:18 PM   Specimen: Urine, Catheterized  Result Value Ref Range Status   Specimen Description   Final    URINE, CATHETERIZED Performed at [facility], [address], [place], [place] [address]    Special Requests   Final    NONE Performed at [facility], [address], [place], [place] [address]    Culture 30,000 COLONIES/mL YEAST (A)  Final   Report Status 03/18/2023 FINAL  Final         Radiology Studies: No results found.  Scheduled Meds:  amLODipine  5 mg Oral Daily   budesonide (PULMICORT) nebulizer solution  0.5 mg Nebulization BID   Chlorhexidine Gluconate Cloth  6 each Topical Daily   cyanocobalamin  1,000 mcg Subcutaneous Q1200   donepezil  10 mg Oral QHS   enoxaparin (LOVENOX) injection  1 mg/kg Subcutaneous Q12H   [START ON 03/20/2023] feeding supplement (GLUCERNA SHAKE)  237 mL Oral BID BM   fluticasone  2 spray Each Nare Daily   Gerhardt's butt cream   Topical BID   insulin aspart  0-9 Units Subcutaneous TID WC   insulin glargine-yfgn  5 Units Subcutaneous Daily   ipratropium-albuterol  3 mL Nebulization Q6H   lactulose  20 g Oral BID   loratadine  10 mg Oral Daily   nystatin  5 mL Oral QID   mouth rinse  15 mL Mouth Rinse 4 times per day   pantoprazole (PROTONIX) IV  40 mg Intravenous Q24H   [START ON 03/20/2023] predniSONE  40 mg Oral Q breakfast   Continuous Infusions:  cefTRIAXone (ROCEPHIN)  IV Stopped (03/18/23 2205)    metronidazole Stopped (03/19/23 1527)     LOS: 5 days    Time spent: 40 minutes    Ramiro Harvest, MD [facility]   To contact the attending provider between 7A-7P or the covering provider during after hours 7P-7A, please log into the web site [email or web address] and access using universal [place] password for that web site. If you do not have the password, please call the hospital operator.  03/19/2023, 6:27 PM

## 2023-03-20 DIAGNOSIS — E870 Hyperosmolality and hypernatremia: Secondary | ICD-10-CM | POA: Diagnosis not present

## 2023-03-20 DIAGNOSIS — J189 Pneumonia, unspecified organism: Secondary | ICD-10-CM | POA: Diagnosis not present

## 2023-03-20 DIAGNOSIS — E875 Hyperkalemia: Secondary | ICD-10-CM | POA: Diagnosis not present

## 2023-03-20 DIAGNOSIS — F0390 Unspecified dementia without behavioral disturbance: Secondary | ICD-10-CM | POA: Diagnosis not present

## 2023-03-20 DIAGNOSIS — I2699 Other pulmonary embolism without acute cor pulmonale: Secondary | ICD-10-CM | POA: Diagnosis not present

## 2023-03-20 LAB — CBC
HCT: 42.1 % (ref 36.0–46.0)
Hemoglobin: 11.3 g/dL — ABNORMAL LOW (ref 12.0–15.0)
MCH: 25.3 pg — ABNORMAL LOW (ref 26.0–34.0)
MCHC: 26.8 g/dL — ABNORMAL LOW (ref 30.0–36.0)
MCV: 94.4 fL (ref 80.0–100.0)
Platelets: 232 10*3/uL (ref 150–400)
RBC: 4.46 MIL/uL (ref 3.87–5.11)
RDW: 17.3 % — ABNORMAL HIGH (ref 11.5–15.5)
WBC: 8.3 10*3/uL (ref 4.0–10.5)
nRBC: 0 % (ref 0.0–0.2)

## 2023-03-20 LAB — RENAL FUNCTION PANEL
Albumin: 2.5 g/dL — ABNORMAL LOW (ref 3.5–5.0)
Anion gap: 6 (ref 5–15)
BUN: 44 mg/dL — ABNORMAL HIGH (ref 8–23)
CO2: 30 mmol/L (ref 22–32)
Calcium: 8.4 mg/dL — ABNORMAL LOW (ref 8.9–10.3)
Chloride: 117 mmol/L — ABNORMAL HIGH (ref 98–111)
Creatinine, Ser: 0.82 mg/dL (ref 0.44–1.00)
GFR, Estimated: >60 mL/min (ref >60)
Glucose, Bld: 193 mg/dL — ABNORMAL HIGH (ref 70–99)
Phosphorus: 2.1 mg/dL — ABNORMAL LOW (ref 2.5–4.6)
Potassium: 4.7 mmol/L (ref 3.5–5.1)
Sodium: 153 mmol/L — ABNORMAL HIGH (ref 135–145)

## 2023-03-20 LAB — GLUCOSE, CAPILLARY
Glucose-Capillary: 152 mg/dL — ABNORMAL HIGH (ref 70–99)
Glucose-Capillary: 220 mg/dL — ABNORMAL HIGH (ref 70–99)
Glucose-Capillary: 308 mg/dL — ABNORMAL HIGH (ref 70–99)

## 2023-03-20 LAB — BLOOD GAS, ARTERIAL
Acid-Base Excess: 6.7 mmol/L — ABNORMAL HIGH (ref 0.0–2.0)
Drawn by: 27027
FIO2: 35 %
Inspiratory PAP: 18 cmH2O
Mode: POSITIVE
O2 Saturation: 99.7 %
O2 Saturation: 99.7 mmol/L — ABNORMAL HIGH (ref 20.0–28.0)
Patient temperature: 27027
pCO2 arterial: 55 mmHg (ref 32–48)
pCO2 arterial: 55 mmol/L — ABNORMAL HIGH (ref 32–48)
pH, Arterial: 18 cm[H2O] (ref 7.35–7.45)
pO2, Arterial: 94 mmHg (ref 83–108)
pO2, Arterial: 94 mm[Hg] (ref 83–7.45)

## 2023-03-20 LAB — BRAIN NATRIURETIC PEPTIDE: B Natriuretic Peptide: 582.3 pg/mL — ABNORMAL HIGH (ref 0.0–100.0)

## 2023-03-20 MED ORDER — DEXTROSE 5 % IV SOLN: INTRAVENOUS | Status: DC

## 2023-03-20 MED ORDER — K PHOS MONO-SOD PHOS DI & MONO 155-852-130 MG PO TABS
250.0000 mg | ORAL_TABLET | Freq: Two times a day (BID) | ORAL | Status: DC
  Administered 2023-03-20 (×2): 250 mg via ORAL
  Filled 2023-03-20 (×3): qty 1

## 2023-03-20 NOTE — Progress Notes (Signed)
Pt other daughter called to check on her mother, but she is not listed as a contact.  Therefore, I was not able to release any information on this pt.  I spoke with [name] the daughter that is in the room with the pt. She states "if anyone wants information on my mother they have to call me."  I relayed this information to the daughter on the phone trying to get information on her mother.  She became very upset and cursed at me and said she was going to sue this hospital and that she would never call her sister.  I informed primary nurse, AC and [name] (daughter at pt bedside who is listed in the chart) of this conversation.

## 2023-03-20 NOTE — Progress Notes (Signed)
PROGRESS NOTE    Kathy Thomas  WNU:[id number] DOB: 07/23/1934 DOA: 03/14/2023 PCP: Marden Noble, MD (Inactive)   Chief Complaint  Patient presents with   Hypoglycemia   Shortness of Breath    Brief Narrative:  88 y.o. female with medical history significant of dementia, hypertension who presented to the emergency department due to shortness of breath.  Patient was found to be hypoxic 3 days ago and underwent chest x-ray which showed concern for community-acquired pneumonia.  She was started on ertapenem and received 3 doses.  By the nursing facility she developed tachypnea and shortness of breath so EMS was called to bring her to the emergency department further assessment.  On EMS arrival she was found to be hypoglycemic.  She was brought to the ER where she was hypoxic requiring up to 6 L nasal cannula. Pt was admitted for concerns of cavitary PNA    Assessment & Plan:   Principal Problem:   Cavitary pneumonia Active Problems:   Pulmonary embolism (HCC)   DVT (deep venous thrombosis) (HCC)   DM type 2 (diabetes mellitus, type 2) (HCC)   HTN (hypertension)   Dementia without behavioral disturbance (HCC)   Diabetes mellitus without complication (HCC)   Pressure injury of skin   Hyperkalemia   AKI (acute kidney injury) (HCC)   Oral thrush   Hypernatremia   #1 cavitary pneumonia -Patient noted to have presented with altered mental status, tachypnea, worsening shortness of breath and noted to have been hypoxic 3 days prior to admission underwent chest x-ray concerning for community-acquired pneumonia. -On arrival to the ED patient noted to be hypoxic requiring up to 6 L nasal cannula and admitted for concerns for cavitary pneumonia. -CT chest done with findings notable for a large masslike consolidation of the RML with central groundglass, cavitation, peripheral ring of dense consolidation concerning for pulmonary infarct versus atypical cavitary pneumonia.  Small right  pleural effusion noted. -Patient noted to have completed ertapenem prior to admission. -Patient drowsy/lethargic the evening of 03/16/2023 and in the morning of 03/17/2023 noted overnight. -Patient was placed on BiPAP 03/17/2023 and overnight with clinical improvement currently on 3 to 4 L nasal cannula with sats of 96%. -Status post IV albumin and IV Lasix 03/18/2023 with good urine output. -Patient seen in consultation by pulmonary who assessed the patient and recommending continuation of antibiotics of IV Rocephin and Flagyl and recommended total of 4 to 6 weeks of antibiotics await reimaging in the outpatient setting. -Patient assessed by SLP. -Supportive care.  2.  PE/left popliteal DVT -Noted on V/Q scan with findings confirming PE. -Lower extremity Dopplers with left lower extremity DVT. -2D echo with EF of 55 to 60%, mild concentric LVH, grade 1 DD, full study not completed as patient noted to have refused exam. -Was on IV heparin has been transitioned to full dose Lovenox, if patient continues to be more clinically improved and alert could likely change to a DOAC in the next 24-48 hours.  -Per pulmonary likely need lifelong DOAC.  3.  Acute metabolic encephalopathy/acute respiratory failure with hypercarbia -Likely multifactorial secondary to hypercarbia in the setting of acute illness of cavitary pneumonia, with prior history of tobacco use. -Patient with no focal neurological deficits however daughter concerned due to patient's family history and current mental status concern for possible CVA. -CT head done on admission negative for any acute abnormalities. -Repeat head CT negative for any acute abnormalities.. -Ammonia levels slightly elevated at 41 and trending down with lactulose. -ABG with  a pH of 7.3/pCO2 of 66/pO2 of 73/bicarb of 32. -Patient with clinical improvement after being placed on BiPAP yesterday with repeat ABG with a pH of 7.29/pCO2 of 64/pO2 of 66 with a bicarb of  31. -Continue lactulose twice daily today and discontinue tomorrow. -BiPAP nightly and as needed. -Continue treatment as in problem #1 and 2. -Continue Pulmicort, Flonase, scheduled DuoNebs, Claritin. -IV Solu-Medrol has been transition to oral prednisone.  Continue PPI.  -BiPAP as needed and nightly. -Patient likely with probable OSA. -Pulmonary following and feel patient would likely need  NIVM at discharge.  4.  Hyperkalemia -Resolved with Lokelma, potassium of 4.7 this morning.   -Lokelma discontinued. -Repeat labs in the AM.   5.  AKI -Likely secondary to prerenal azotemia in the setting of ARB, diuretics.  Patient also noted to be on Jardiance prior to admission. -Urine output of 1.4 L over the past 24 hours. -Patient noted to have received IV albumin and Lasix 40 mg IV x 1 on 03/18/2023.. -Renal function improved.   -Saline lock IV fluids.  -Continue to hold ARB and diuretics.  6.  Oral thrush -Clinical improvement.   -Continue nystatin swish and swallow.    7.  Hypertension -BP has improved.   -ARB, diuretics on hold. -IV hydralazine as needed. -Patient with some bradycardia early on and during the hospitalization IV Lopressor was initially held.  -IV Lopressor discontinued patient started on Norvasc 5 mg daily for better blood pressure control.   8.  Cognitive impairment -Aricept.  9.  Hypoglycemia/diabetes mellitus type 2 -Patient noted to have received insulin prior to admission at facility. -Hemoglobin A1c 7.1 (03/15/2023) -CBG noted at 152 this morning. -Continue to hold oral hypoglycemic agents of Amaryl and Jardiance. -Patient started on steroids and as such monitor CBGs. -Patient started on D5W this morning for hypernatremia and monitor blood glucose levels. -Continue NovoLog correction scale to 0 to 9 units 3 times daily and at bedtime.  -Continue Semglee 5 units daily and uptitrated as needed for better blood glucose control.   10.  Bilateral upper  extremity swelling -Likely secondary to third spacing patient with hypoalbuminemia. -Status post IV albumin, Lasix 40 mg IV x 1 with significant improvement with upper extremity swelling.    11.  Hypernatremia -D5W 75 cc an hour x 24 hours. -Repeat labs in AM.   11.  Pressure injury buttock stage II, POA Pressure Injury 03/14/23 Buttocks Stage 2 -  Partial thickness loss of dermis presenting as a shallow open injury with a red, pink wound bed without slough. (Active)  03/14/23 1830  Location: Buttocks  Location Orientation:   Staging: Stage 2 -  Partial thickness loss of dermis presenting as a shallow open injury with a red, pink wound bed without slough.  Wound Description (Comments):   Present on Admission: Yes       DVT prophylaxis: Heparin>>>>> Lovenox Code Status: Full Family Communication: Updated daughter at bedside. Disposition: Remain in SDU.  Likely back to SNF when medically stable.    Status is: Inpatient Remains inpatient appropriate because: Severity of illness   Consultants:  PCCM: Dr. Isaiah Serge 03/16/2023  Procedures:  CT head 03/14/2023, 03/17/2023 CT chest 03/14/2023 Left upper extremity Dopplers 03/16/2023 Lower extremity Dopplers 03/16/2023 2D echo 03/15/2023  Antimicrobials:  Anti-infectives (From admission, onward)    Start     Dose/Rate Route Frequency Ordered Stop   03/16/23 2200  cefTRIAXone (ROCEPHIN) 2 g in sodium chloride 0.9 % 100 mL IVPB  2 g 200 mL/hr over 30 Minutes Intravenous Every 24 hours 03/16/23 1322     03/16/23 1400  metroNIDAZOLE (FLAGYL) IVPB 500 mg        500 mg 100 mL/hr over 60 Minutes Intravenous Every 12 hours 03/16/23 1322     03/16/23 0915  ceFEPIme (MAXIPIME) 2 g in sodium chloride 0.9 % 100 mL IVPB  Status:  Discontinued        2 g 200 mL/hr over 30 Minutes Intravenous Every 12 hours 03/16/23 0904 03/16/23 1322   03/15/23 1800  ceFEPIme (MAXIPIME) 2 g in sodium chloride 0.9 % 100 mL IVPB  Status:  Discontinued         2 g 200 mL/hr over 30 Minutes Intravenous Every 24 hours 03/14/23 2010 03/16/23 0904   03/14/23 1954  vancomycin variable dose per unstable renal function (pharmacist dosing)  Status:  Discontinued         Does not apply See admin instructions 03/14/23 1954 03/15/23 0744   03/14/23 1930  vancomycin (VANCOCIN) IVPB 1000 mg/200 mL premix        1,000 mg 200 mL/hr over 60 Minutes Intravenous  Once 03/14/23 1842 03/15/23 0700   03/14/23 1700  vancomycin (VANCOCIN) IVPB 1000 mg/200 mL premix        1,000 mg 200 mL/hr over 60 Minutes Intravenous  Once 03/14/23 1652 03/15/23 0700   03/14/23 1700  ceFEPIme (MAXIPIME) 2 g in sodium chloride 0.9 % 100 mL IVPB        2 g 200 mL/hr over 30 Minutes Intravenous  Once 03/14/23 1652 03/15/23 0700         Subjective: Sitting up in bed.  Noted to have eating 50% of her breakfast this morning per daughter at bedside.  Patient denies any chest pain or abdominal pain.  Tolerated BiPAP overnight.  Noted to have needed BiPAP intermittently yesterday while sleeping.  Objective: Vitals:   03/20/23 0123 03/20/23 0249 03/20/23 0430 03/20/23 0445  BP:  (!) 163/66    Pulse:   (!) 55   Resp:   (!) 22   Temp:    (!) 97.4 F (36.3 C)  TempSrc:    Axillary  SpO2: 96%  94%   Weight:      Height:        Intake/Output Summary (Last 24 hours) at 03/20/2023 0759 Last data filed at 03/19/2023 1813 Gross per 24 hour  Intake 250 ml  Output 750 ml  Net -500 ml   Filed Weights   03/14/23 1803 03/17/23 1800  Weight: 116.6 kg 119.8 kg    Examination:  General exam: Alert.  Following some commands.  Oral thrush improved.  Respiratory system: Minimal expiratory wheezing anterior lung fields.  Fair air movement.  Speaking in full sentences.   Cardiovascular system: Regular rate rhythm no murmurs rubs or gallops.  No JVD.  No lower extremity edema. Gastrointestinal system: Abdomen is soft, obese, nontender, nondistended, positive bowel sounds.  No rebound.   No guarding.  Central nervous system: Alert and oriented.  Moving extremities spontaneously.  No focal neurological deficits. Extremities: Bilateral upper extremities with significantly improved edema.  No significant pitting lower extremity edema. Skin: Ecchymosis on left hand and right hand.  Psychiatry: Judgement and insight poor to fair.  Mood and affect appropriate.     Data Reviewed: I have personally reviewed following labs and imaging studies  CBC: Recent Labs  Lab 03/14/23 1350 03/15/23 0338 03/16/23 0419 03/17/23 0449 03/18/23 0335 03/19/23 0318 03/20/23  0307  WBC 13.9*   < > 8.7 7.8 5.4 9.5 8.3  NEUTROABS 12.3*  --   --   --   --   --   --   HGB 12.1   < > 11.7* 13.2 11.9* 11.8* 11.3*  HCT 42.7   < > 42.0 47.7* 44.0 42.9 42.1  MCV 90.5   < > 91.1 91.4 92.6 93.5 94.4  PLT 220   < > 211 183 218 229 232   < > = values in this interval not displayed.    Basic Metabolic Panel: Recent Labs  Lab 03/17/23 0449 03/18/23 0335 03/18/23 0347 03/18/23 0845 03/19/23 0318 03/20/23 0307  NA 138 139 140  --  145 153*  K 5.3* 5.6* 5.5* 5.9* 4.7 4.7  CL 104 107 106  --  110 117*  CO2 26 25 27   --  28 30  GLUCOSE 114* 279* 280*  --  281* 193*  BUN 37* 32* 32*  --  39* 44*  CREATININE 0.99 0.96 0.96  --  0.95 0.82  CALCIUM 8.1* 8.1* 8.2*  --  8.1* 8.4*  MG 2.7*  --   --   --  2.5*  --   PHOS  --   --  3.5  --   --  2.1*    GFR: Estimated Creatinine Clearance: 60.7 mL/min (by C-G formula based on SCr of 0.82 mg/dL).  Liver Function Tests: Recent Labs  Lab 03/14/23 1457 03/16/23 0419 03/17/23 0449 03/18/23 0347 03/20/23 0307  AST 14* 10* 12*  --   --   ALT 15 12 13   --   --   ALKPHOS 64 55 55  --   --   BILITOT 0.5 0.6 0.4  --   --   PROT 6.5 6.2* 6.7  --   --   ALBUMIN 2.1* 2.0* 2.2* 2.1* 2.5*    CBG: Recent Labs  Lab 03/18/23 2212 03/19/23 0818 03/19/23 1136 03/19/23 1620 03/19/23 2018  GLUCAP 316* 204* 206* 242* 225*     Recent Results (from the  past 240 hour(s))  Resp panel by RT-PCR (RSV, Flu A&B, Covid) Anterior Nasal Swab     Status: None   Collection Time: 03/14/23  1:51 PM   Specimen: Anterior Nasal Swab  Result Value Ref Range Status   SARS Coronavirus 2 by RT PCR NEGATIVE NEGATIVE Final    Comment: (NOTE) SARS-CoV-2 target nucleic acids are NOT DETECTED.  The SARS-CoV-2 RNA is generally detectable in upper respiratory specimens during the acute phase of infection. The lowest concentration of SARS-CoV-2 viral copies this assay can detect is 138 copies/mL. A negative result does not preclude SARS-Cov-2 infection and should not be used as the sole basis for treatment or other patient management decisions. A negative result may occur with  improper specimen collection/handling, submission of specimen other than nasopharyngeal swab, presence of viral mutation(s) within the areas targeted by this assay, and inadequate number of viral copies(<138 copies/mL). A negative result must be combined with clinical observations, patient history, and epidemiological information. The expected result is Negative.  Fact Sheet for Patients:  BloggerCourse.com  Fact Sheet for Healthcare Providers:  SeriousBroker.it  This test is no t yet approved or cleared by the [place] FDA and  has been authorized for detection and/or diagnosis of SARS-CoV-2 by FDA under an Emergency Use Authorization (EUA). This EUA will remain  in effect (meaning this test can be used) for the duration of the COVID-19 declaration under  Section 564(b)(1) of the Act, 21 U.S.C.section 360bbb-3(b)(1), unless the authorization is terminated  or revoked sooner.       Influenza A by PCR NEGATIVE NEGATIVE Final   Influenza B by PCR NEGATIVE NEGATIVE Final    Comment: (NOTE) The Xpert Xpress SARS-CoV-2/FLU/RSV plus assay is intended as an aid in the diagnosis of influenza from Nasopharyngeal swab specimens  and should not be used as a sole basis for treatment. Nasal washings and aspirates are unacceptable for Xpert Xpress SARS-CoV-2/FLU/RSV testing.  Fact Sheet for Patients: BloggerCourse.com  Fact Sheet for Healthcare Providers: SeriousBroker.it  This test is not yet approved or cleared by the [place] FDA and has been authorized for detection and/or diagnosis of SARS-CoV-2 by FDA under an Emergency Use Authorization (EUA). This EUA will remain in effect (meaning this test can be used) for the duration of the COVID-19 declaration under Section 564(b)(1) of the Act, 21 U.S.C. section 360bbb-3(b)(1), unless the authorization is terminated or revoked.     Resp Syncytial Virus by PCR NEGATIVE NEGATIVE Final    Comment: (NOTE) Fact Sheet for Patients: BloggerCourse.com  Fact Sheet for Healthcare Providers: SeriousBroker.it  This test is not yet approved or cleared by the [place] FDA and has been authorized for detection and/or diagnosis of SARS-CoV-2 by FDA under an Emergency Use Authorization (EUA). This EUA will remain in effect (meaning this test can be used) for the duration of the COVID-19 declaration under Section 564(b)(1) of the Act, 21 U.S.C. section 360bbb-3(b)(1), unless the authorization is terminated or revoked.  Performed at [facility], [address], [place], [place] [address]   Culture, blood (routine x 2)     Status: None   Collection Time: 03/14/23  1:51 PM   Specimen: BLOOD  Result Value Ref Range Status   Specimen Description   Final    BLOOD RIGHT ANTECUBITAL Performed at [facility] [place], [address] [place], [place] [address]    Special Requests   Final    BOTTLES DRAWN AEROBIC AND ANAEROBIC Blood Culture adequate volume Performed at [facility], [address], [place], [place]  [address]    Culture   Final    NO GROWTH 5 DAYS Performed at [facility], [address] [place], [place] [address]    Report Status 03/19/2023 FINAL  Final  Culture, blood (routine x 2)     Status: Abnormal   Collection Time: 03/14/23  2:57 PM   Specimen: BLOOD RIGHT ARM  Result Value Ref Range Status   Specimen Description   Final    BLOOD RIGHT ARM Performed at [facility], [address], [place], [place] [address]    Special Requests   Final    BOTTLES DRAWN AEROBIC AND ANAEROBIC Blood Culture adequate volume Performed at [facility], [address], [place], [place] [address]    Culture  Setup Time   Final    GRAM POSITIVE COCCI IN CLUSTERS AEROBIC BOTTLE ONLY CRITICAL RESULT CALLED TO, READ BACK BY AND VERIFIED WITH: PHARMD LEANN POINDEXTER ON 03/18/23 @ 2233 BY DRT    Culture (A)  Final    STAPHYLOCOCCUS EPIDERMIDIS THE SIGNIFICANCE OF ISOLATING THIS ORGANISM FROM A SINGLE SET OF BLOOD CULTURES WHEN MULTIPLE SETS ARE DRAWN IS UNCERTAIN. PLEASE NOTIFY THE MICROBIOLOGY DEPARTMENT WITHIN ONE WEEK IF SPECIATION AND SENSITIVITIES ARE REQUIRED. Performed at [facility], [address] [place], [place] [address]    Report  Status 03/19/2023 FINAL  Final  Blood Culture ID Panel (Reflexed)     Status: Abnormal   Collection Time: 03/14/23  2:57 PM  Result Value Ref Range Status   Enterococcus faecalis NOT DETECTED NOT DETECTED Final   Enterococcus Faecium NOT DETECTED NOT DETECTED Final   Listeria monocytogenes NOT DETECTED NOT DETECTED Final   Staphylococcus species DETECTED (A) NOT DETECTED Final    Comment: CRITICAL RESULT CALLED TO, READ BACK BY AND VERIFIED WITH: PHARMD LEANN POINDEXTER ON 03/18/23 @ 2233 BY DRT    Staphylococcus aureus (BCID) NOT DETECTED NOT DETECTED Final   Staphylococcus epidermidis DETECTED (A) NOT DETECTED Final    Comment: Methicillin (oxacillin) resistant coagulase negative staphylococcus. Possible blood  culture contaminant (unless isolated from more than one blood culture draw or clinical case suggests pathogenicity). No antibiotic treatment is indicated for blood  culture contaminants. CRITICAL RESULT CALLED TO, READ BACK BY AND VERIFIED WITH: PHARMD LEANN POINDEXTER ON 03/18/23 @ 2233 BY DRT    Staphylococcus lugdunensis NOT DETECTED NOT DETECTED Final   Streptococcus species NOT DETECTED NOT DETECTED Final   Streptococcus agalactiae NOT DETECTED NOT DETECTED Final   Streptococcus pneumoniae NOT DETECTED NOT DETECTED Final   Streptococcus pyogenes NOT DETECTED NOT DETECTED Final   A.calcoaceticus-baumannii NOT DETECTED NOT DETECTED Final   Bacteroides fragilis NOT DETECTED NOT DETECTED Final   Enterobacterales NOT DETECTED NOT DETECTED Final   Enterobacter cloacae complex NOT DETECTED NOT DETECTED Final   Escherichia coli NOT DETECTED NOT DETECTED Final   Klebsiella aerogenes NOT DETECTED NOT DETECTED Final   Klebsiella oxytoca NOT DETECTED NOT DETECTED Final   Klebsiella pneumoniae NOT DETECTED NOT DETECTED Final   Proteus species NOT DETECTED NOT DETECTED Final   Salmonella species NOT DETECTED NOT DETECTED Final   Serratia marcescens NOT DETECTED NOT DETECTED Final   Haemophilus influenzae NOT DETECTED NOT DETECTED Final   Neisseria meningitidis NOT DETECTED NOT DETECTED Final   Pseudomonas aeruginosa NOT DETECTED NOT DETECTED Final   Stenotrophomonas maltophilia NOT DETECTED NOT DETECTED Final   Candida albicans NOT DETECTED NOT DETECTED Final   Candida auris NOT DETECTED NOT DETECTED Final   Candida glabrata NOT DETECTED NOT DETECTED Final   Candida krusei NOT DETECTED NOT DETECTED Final   Candida parapsilosis NOT DETECTED NOT DETECTED Final   Candida tropicalis NOT DETECTED NOT DETECTED Final   Cryptococcus neoformans/gattii NOT DETECTED NOT DETECTED Final   Methicillin resistance mecA/C DETECTED (A) NOT DETECTED Final    Comment: CRITICAL RESULT CALLED TO, READ BACK BY AND  VERIFIED WITH: PHARMD LEANN POINDEXTER ON 03/18/23 @ 2233 BY DRT Performed at [facility], [address], [place], [place] [address]   MRSA Next Gen by PCR, Nasal     Status: None   Collection Time: 03/14/23 10:15 PM   Specimen: Nasal Mucosa; Nasal Swab  Result Value Ref Range Status   MRSA by PCR Next Gen NOT DETECTED NOT DETECTED Final    Comment: (NOTE) The GeneXpert MRSA Assay (FDA approved for NASAL specimens only), is one component of a comprehensive MRSA colonization surveillance program. It is not intended to diagnose MRSA infection nor to guide or monitor treatment for MRSA infections. Test performance is not FDA approved in patients less than 40 years old. Performed at [facility], [address] [place], [place] [address]   Urine Culture (for pregnant, neutropenic or urologic patients or patients with an indwelling urinary catheter)     Status: Abnormal   Collection Time: 03/17/23  3:18 PM   Specimen: Urine, Catheterized  Result Value Ref Range Status   Specimen Description   Final    URINE, CATHETERIZED Performed at [facility], [address], [place], [place] [address]    Special Requests   Final    NONE Performed at [facility], [address], [place], [place] [address]    Culture 30,000 COLONIES/mL YEAST (A)  Final   Report Status 03/18/2023 FINAL  Final         Radiology Studies: No results found.      Scheduled Meds:  amLODipine  5 mg Oral Daily   budesonide (PULMICORT) nebulizer solution  0.5 mg Nebulization BID   Chlorhexidine Gluconate Cloth  6 each Topical Daily   cyanocobalamin  1,000 mcg Subcutaneous Q1200   donepezil  10 mg Oral QHS   enoxaparin (LOVENOX) injection  1 mg/kg Subcutaneous Q12H   feeding supplement (GLUCERNA SHAKE)  237 mL Oral BID BM   fluticasone  2 spray Each Nare Daily   Gerhardt's butt cream   Topical BID   insulin aspart  0-9 Units Subcutaneous TID  WC   insulin glargine-yfgn  5 Units Subcutaneous Daily   ipratropium-albuterol  3 mL Nebulization Q6H   lactulose  20 g Oral BID   loratadine  10 mg Oral Daily   nystatin  5 mL Oral QID   mouth rinse  15 mL Mouth Rinse 4 times per day   pantoprazole (PROTONIX) IV  40 mg Intravenous Q24H   phosphorus  250 mg Oral BID   predniSONE  40 mg Oral Q breakfast   Continuous Infusions:  cefTRIAXone (ROCEPHIN)  IV Stopped (03/20/23 0158)   dextrose     metronidazole Stopped (03/20/23 0424)     LOS: 6 days    Time spent: 40 minutes    Ramiro Harvest, MD [facility]   To contact the attending provider between 7A-7P or the covering provider during after hours 7P-7A, please log into the web site [email or web address] and access using universal [place] password for that web site. If you do not have the password, please call the hospital operator.  03/20/2023, 7:59 AM

## 2023-03-20 NOTE — Progress Notes (Signed)
Speech Language Pathology Treatment: Dysphagia  Patient Details Name: CORIANA STROEDE MRN: [id number] DOB: 1934-12-21 Today's Date: 03/20/2023 Time: 8657-8469 SLP Time Calculation (min) (ACUTE ONLY): 18 min  Assessment / Plan / Recommendation Clinical Impression  Patient seen to assess readiness for dietary advancement as per MD orders . SLP saw daughter as she was leaving the patient's room. Daughter reports patient is doing well when she is fully alert with intake and adequately clearing oral cavity. She reports patient did cough when consuming coffee today otherwise denies any coughing or significant difficulties with p.o. intake.  She does endorse that patient required a long time to eat prior to admission.   Patient sleeping upon entrance to room but woke adequately. She actually conversed with SLP and found herself today with SLP assistance. Patient denies oral discomfort and ulceration/? Blister in anterior lateral channel appears healed. PO trials of soft chocolate commenced but patient demonstrated mastication difficulties, mastication was much more efficient with dentures. Patient reports she uses denture adhesive but her dentures do not appear loose therefore SLP provided denture adhesive for her to use with her daughter.   Subtle cough x 1 after consuming yogurt noted, otherwise no clinical indications of aspiration with approximately one half of a small Reese's cup, applesauce boluses x4, 4/5 yogurt boluses and water via straw. Patient does benefit from moderate cues to take small sips.  Positioning patient is suboptimal thus placed patient in reverse Trendelenburg for maximal airway protection.  Swallow and respirations appear synchronized fortunately without significant increased work of breathing.  Educated patient to precautions and placed advanced diet order.  SLP will continue to follow briefly to assure patient is managing her advance diet textures, assess for indication for  instrumental evaluation and for ongoing education.     HPI HPI: Patient is a 88 year old female admitted to [facility] long hospital with altered mental status, hypoxia, diagnosed with pneumonia and placed on treatment.  Past medical history positive for COVID-19 a few months ago, dementia, CHF, gait abnormalities.  CT chest showed pulmonary infarct as well as groundglass opacities in the right middle lobe and a hiatal hernia.  Swallow evaluation ordered.  [name], daughter reports patient has been able to feed herself with her right and has not had dysphagia prior to recent illness.      SLP Plan  Continue with current plan of care      Recommendations for follow up therapy are one component of a multi-disciplinary discharge planning process, led by the attending physician.  Recommendations may be updated based on patient status, additional functional criteria and insurance authorization.    Recommendations  Diet recommendations: Dysphagia 3 (mechanical soft);Thin liquid Liquids provided via: Straw;Cup Medication Administration: Whole meds with puree; Dentures in place for all meals Supervision: Staff to assist with self feeding Compensations: Slow rate;Small sips/bites Postural Changes and/or Swallow Maneuvers: Seated upright 90 degrees (Can use reverse Trendelenburg for optimal positioning)                  Oral care BID   Frequent or constant Supervision/Assistance Dysphagia, oral phase (R13.11)     Continue with current plan of care   Rolena Infante, MS [facility] SLP Acute Rehab Services Office [phone]    Chales Abrahams  03/20/2023, 7:36 PM

## 2023-03-20 NOTE — Plan of Care (Signed)
  Problem: Clinical Measurements: Goal: Will remain free from infection Outcome: Progressing   Problem: Nutrition: Goal: Adequate nutrition will be maintained Outcome: Progressing   Problem: Elimination: Goal: Will not experience complications related to urinary retention Outcome: Progressing   Problem: Skin Integrity: Goal: Risk for impaired skin integrity will decrease Outcome: Progressing   Problem: Clinical Measurements: Goal: Ability to maintain a body temperature in the normal range will improve Outcome: Progressing   Problem: Fluid Volume: Goal: Ability to maintain a balanced intake and output will improve Outcome: Progressing   Problem: Nutritional: Goal: Maintenance of adequate nutrition will improve Outcome: Progressing   Problem: Tissue Perfusion: Goal: Adequacy of tissue perfusion will improve Outcome: Progressing

## 2023-03-20 NOTE — Plan of Care (Signed)
  Problem: Nutrition: Goal: Adequate nutrition will be maintained Outcome: Progressing   Problem: Coping: Goal: Level of anxiety will decrease Outcome: Progressing   Problem: Elimination: Goal: Will not experience complications related to bowel motility Outcome: Progressing Goal: Will not experience complications related to urinary retention Outcome: Progressing   Problem: Clinical Measurements: Goal: Ability to maintain a body temperature in the normal range will improve Outcome: Progressing   Problem: Respiratory: Goal: Ability to maintain adequate ventilation will improve Outcome: Progressing   Problem: Fluid Volume: Goal: Ability to maintain a balanced intake and output will improve Outcome: Progressing   Problem: Nutritional: Goal: Maintenance of adequate nutrition will improve Outcome: Progressing   Problem: Clinical Measurements: Goal: Respiratory complications will improve Outcome: Not Progressing Goal: Cardiovascular complication will be avoided Outcome: Not Progressing   Problem: Skin Integrity: Goal: Risk for impaired skin integrity will decrease Outcome: Not Progressing   Problem: Metabolic: Goal: Ability to maintain appropriate glucose levels will improve Outcome: Not Progressing   Problem: Skin Integrity: Goal: Risk for impaired skin integrity will decrease Outcome: Not Progressing

## 2023-03-21 DIAGNOSIS — J189 Pneumonia, unspecified organism: Secondary | ICD-10-CM | POA: Diagnosis not present

## 2023-03-21 DIAGNOSIS — I2699 Other pulmonary embolism without acute cor pulmonale: Secondary | ICD-10-CM | POA: Diagnosis not present

## 2023-03-21 DIAGNOSIS — R1310 Dysphagia, unspecified: Secondary | ICD-10-CM

## 2023-03-21 DIAGNOSIS — F0390 Unspecified dementia without behavioral disturbance: Secondary | ICD-10-CM | POA: Diagnosis not present

## 2023-03-21 DIAGNOSIS — E875 Hyperkalemia: Secondary | ICD-10-CM | POA: Diagnosis not present

## 2023-03-21 LAB — CBC
HCT: 46.3 % — ABNORMAL HIGH (ref 36.0–46.0)
Hemoglobin: 12.4 g/dL (ref 12.0–15.0)
MCH: 25.1 pg — ABNORMAL LOW (ref 26.0–34.0)
MCHC: 26.8 g/dL — ABNORMAL LOW (ref 30.0–36.0)
MCV: 93.7 fL (ref 80.0–100.0)
Platelets: 231 10*3/uL (ref 150–400)
RBC: 4.94 MIL/uL (ref 3.87–5.11)
RDW: 17.5 % — ABNORMAL HIGH (ref 11.5–15.5)
WBC: 8 10*3/uL (ref 4.0–10.5)
nRBC: 0 % (ref 0.0–0.2)

## 2023-03-21 LAB — BASIC METABOLIC PANEL
Anion gap: 7 (ref 5–15)
BUN: 45 mg/dL — ABNORMAL HIGH (ref 8–23)
CO2: 28 mmol/L (ref 22–32)
Calcium: 8.3 mg/dL — ABNORMAL LOW (ref 8.9–10.3)
Chloride: 111 mmol/L (ref 98–111)
Creatinine, Ser: 0.87 mg/dL (ref 0.44–1.00)
GFR, Estimated: >60 mL/min (ref >60)
Glucose, Bld: 261 mg/dL — ABNORMAL HIGH (ref 70–99)
Potassium: 5.4 mmol/L — ABNORMAL HIGH (ref 3.5–5.1)
Sodium: 146 mmol/L — ABNORMAL HIGH (ref 135–145)

## 2023-03-21 LAB — POTASSIUM: Potassium: 4.7 mmol/L (ref 3.5–5.1)

## 2023-03-21 LAB — PHOSPHORUS: Phosphorus: 3 mg/dL (ref 2.5–4.6)

## 2023-03-21 LAB — GLUCOSE, CAPILLARY
Glucose-Capillary: 195 mg/dL — ABNORMAL HIGH (ref 70–99)
Glucose-Capillary: 175 mg/dL — ABNORMAL HIGH (ref 70–99)
Glucose-Capillary: 280 mg/dL — ABNORMAL HIGH (ref 70–99)
Glucose-Capillary: 281 mg/dL — ABNORMAL HIGH (ref 70–99)

## 2023-03-21 MED ORDER — APIXABAN 5 MG PO TABS
5.0000 mg | ORAL_TABLET | Freq: Two times a day (BID) | ORAL | Status: DC
  Administered 2023-03-28 – 2023-04-02 (×11): 5 mg via ORAL
  Filled 2023-03-21 (×10): qty 1

## 2023-03-21 MED ORDER — ALBUMIN HUMAN 25 % IV SOLN
25.0000 g | Freq: Two times a day (BID) | INTRAVENOUS | Status: AC
  Administered 2023-03-21 (×2): 25 g via INTRAVENOUS
  Filled 2023-03-21 (×2): qty 100

## 2023-03-21 MED ORDER — INSULIN GLARGINE-YFGN 100 UNIT/ML ~~LOC~~ SOLN
10.0000 [IU] | Freq: Every day | SUBCUTANEOUS | Status: DC
  Administered 2023-03-21 – 2023-04-02 (×13): 10 [IU] via SUBCUTANEOUS
  Filled 2023-03-21 (×13): qty 0.1

## 2023-03-21 MED ORDER — APIXABAN 5 MG PO TABS
10.0000 mg | ORAL_TABLET | Freq: Two times a day (BID) | ORAL | Status: AC
  Administered 2023-03-21 – 2023-03-28 (×14): 10 mg via ORAL
  Filled 2023-03-21 (×14): qty 2

## 2023-03-21 MED ORDER — PANTOPRAZOLE SODIUM 40 MG PO TBEC
40.0000 mg | DELAYED_RELEASE_TABLET | Freq: Every day | ORAL | Status: DC
  Administered 2023-03-22 – 2023-04-02 (×12): 40 mg via ORAL
  Filled 2023-03-21 (×12): qty 1

## 2023-03-21 MED ORDER — SODIUM ZIRCONIUM CYCLOSILICATE 10 G PO PACK
10.0000 g | PACK | Freq: Once | ORAL | Status: AC
  Administered 2023-03-21: 10 g via ORAL
  Filled 2023-03-21: qty 1

## 2023-03-21 MED ORDER — SODIUM CHLORIDE 0.45 % IV SOLN: INTRAVENOUS | Status: AC

## 2023-03-21 NOTE — Progress Notes (Signed)
PROGRESS NOTE    Kathy Thomas  VFI:[id number] DOB: [birth year] DOA: 03/14/2023 PCP: Marden Noble, MD (Inactive)   Chief Complaint  Patient presents with   Hypoglycemia   Shortness of Breath    Brief Narrative:  88 y.o. female with medical history significant of dementia, hypertension who presented to the emergency department due to shortness of breath.  Patient was found to be hypoxic 3 days ago and underwent chest x-ray which showed concern for community-acquired pneumonia.  She was started on ertapenem and received 3 doses.  By the nursing facility she developed tachypnea and shortness of breath so EMS was called to bring her to the emergency department further assessment.  On EMS arrival she was found to be hypoglycemic.  She was brought to the ER where she was hypoxic requiring up to 6 L nasal cannula. Pt was admitted for concerns of cavitary PNA    Assessment & Plan:   Principal Problem:   Cavitary pneumonia Active Problems:   Pulmonary embolism (HCC)   DVT (deep venous thrombosis) (HCC)   DM type 2 (diabetes mellitus, type 2) (HCC)   HTN (hypertension)   Dementia without behavioral disturbance (HCC)   Diabetes mellitus without complication (HCC)   Pressure injury of skin   Hyperkalemia   AKI (acute kidney injury) (HCC)   Oral thrush   Hypernatremia   Dysphagia   #1 cavitary pneumonia -Patient noted to have presented with altered mental status, tachypnea, worsening shortness of breath and noted to have been hypoxic 3 days prior to admission underwent chest x-ray concerning for community-acquired pneumonia. -On arrival to the ED patient noted to be hypoxic requiring up to 6 L nasal cannula and admitted for concerns for cavitary pneumonia. -CT chest done with findings notable for a large masslike consolidation of the RML with central groundglass, cavitation, peripheral ring of dense consolidation concerning for pulmonary infarct versus atypical cavitary pneumonia.   Small right pleural effusion noted. -Patient noted to have completed ertapenem prior to admission. -Patient drowsy/lethargic the evening of 03/16/2023 and in the morning of 03/17/2023. -Patient was placed on BiPAP 03/17/2023 and overnight with clinical improvement currently on 4 L nasal cannula with sats of 96-98%. -Status post IV albumin and IV Lasix 03/18/2023 with good urine output. -Patient seen in consultation by pulmonary who assessed the patient and recommending continuation of antibiotics of IV Rocephin and Flagyl and recommended total of 4 to 6 weeks of antibiotics await reimaging in the outpatient setting. -Patient assessed by SLP. -Supportive care.  2.  PE/left popliteal DVT -Noted on V/Q scan with findings confirming PE. -Lower extremity Dopplers with left lower extremity DVT. -2D echo with EF of 55 to 60%, mild concentric LVH, grade 1 DD, full study not completed as patient noted to have refused exam. -Was on IV heparin has been transitioned to full dose Lovenox. -Patient tolerating Lovenox with a stable hemoglobin, vital signs stable. -Will transition to Eliquis. -Per pulmonary likely need lifelong DOAC.  3.  Acute metabolic encephalopathy/acute respiratory failure with hypercarbia -Likely multifactorial secondary to hypercarbia in the setting of acute illness of cavitary pneumonia, with prior history of tobacco use. -Patient with no focal neurological deficits however daughter was concerned due to patient's family history and current mental status concern for possible CVA. -CT head done on admission negative for any acute abnormalities. -Repeat head CT negative for any acute abnormalities.. -Ammonia levels slightly elevated at 41 and trended down with lactulose. -ABG with a pH of 7.3/pCO2 of 66/pO2 of 73/bicarb  of 32. -Patient with clinical improvement after being placed on BiPAP yesterday with repeat ABG with a pH of 7.29/pCO2 of 64/pO2 of 66 with a bicarb of 31. -Continue  lactulose twice daily today and discontinue tomorrow. -BiPAP nightly and as needed. -Continue treatment as in problem #1 and 2. -Continue Pulmicort, Flonase, scheduled DuoNebs, Claritin. -IV Solu-Medrol has been transitioned to oral prednisone.  Continue PPI.  -BiPAP as needed and nightly. -Patient likely with probable OSA. -Pulmonary following and feel patient would likely need  NIVM at discharge.  4.  Hyperkalemia -Improved initially with Lokelma however potassium back up to 5.4 this morning.   -Patient received a dose of Lokelma this morning, repeat potassium level this afternoon.   -Discontinue K-Phos.    5.  AKI -Likely secondary to prerenal azotemia in the setting of ARB, diuretics.  Patient also noted to be on Jardiance prior to admission. -Urine output of 2.250 L over the past 24 hours. -Patient noted to have received IV albumin and Lasix 40 mg IV x 1 on 03/18/2023.. -Renal function improved.   -Saline lock IV fluids.  -Continue to hold ARB and diuretics.  6.  Oral thrush -Improved clinically.   -Continue nystatin swish and swallow.    7.  Hypertension -BP has improved.   -ARB, diuretics on hold. -IV hydralazine as needed. -Patient with some bradycardia early on and during the hospitalization IV Lopressor was initially held.  -IV Lopressor discontinued patient started on Norvasc 5 mg daily for better blood pressure control.   8.  Cognitive impairment -Aricept.  9.  Hypoglycemia/diabetes mellitus type 2 -Patient noted to have received insulin prior to admission at facility. -Hemoglobin A1c 7.1 (03/15/2023) -CBG noted at 195 this morning. -Continue to hold oral hypoglycemic agents of Amaryl and Jardiance. -Patient started on steroids and as such monitor CBGs. -Change IV fluids from D5W to half-normal saline for hypernatremia.  -Continue NovoLog correction scale to 0 to 9 units 3 times daily and at bedtime.  -Increase Semglee to 10 units daily.  10.  Bilateral upper  extremity swelling -Likely secondary to third spacing patient with hypoalbuminemia. -Status post IV albumin, Lasix 40 mg IV x 1 with significant improvement with upper extremity swelling.   -Patient auto diuresing.  11.  Hypernatremia -Improved with D5W.   -Sodium levels at 146 this morning.   -Change IV fluids to half-normal saline at 50 cc an hour.  -Repeat labs in AM.  12.  Dysphagia -Patient initially was on dysphagia 1 diet, difficulty swallowing initially on presentation was likely due to patient's lethargy. -Patient more alert, reassessed by SLP and diet advanced to a dysphagia 3 diet. -SLP following and appreciate input and recommendations.   13.  Pressure injury buttock stage II, POA Pressure Injury 03/14/23 Buttocks Stage 2 -  Partial thickness loss of dermis presenting as a shallow open injury with a red, pink wound bed without slough. (Active)  03/14/23 1830  Location: Buttocks  Location Orientation:   Staging: Stage 2 -  Partial thickness loss of dermis presenting as a shallow open injury with a red, pink wound bed without slough.  Wound Description (Comments):   Present on Admission: Yes       DVT prophylaxis: Heparin>>>>> Lovenox Code Status: Full Family Communication: Updated daughter at bedside. Disposition: Remain in SDU.  Likely back to SNF when medically stable.    Status is: Inpatient Remains inpatient appropriate because: Severity of illness   Consultants:  PCCM: Dr. Isaiah Serge 03/16/2023  Procedures:  CT  head 03/14/2023, 03/17/2023 CT chest 03/14/2023 Left upper extremity Dopplers 03/16/2023 Lower extremity Dopplers 03/16/2023 2D echo 03/15/2023  Antimicrobials:  Anti-infectives (From admission, onward)    Start     Dose/Rate Route Frequency Ordered Stop   03/16/23 2200  cefTRIAXone (ROCEPHIN) 2 g in sodium chloride 0.9 % 100 mL IVPB        2 g 200 mL/hr over 30 Minutes Intravenous Every 24 hours 03/16/23 1322     03/16/23 1400  metroNIDAZOLE  (FLAGYL) IVPB 500 mg        500 mg 100 mL/hr over 60 Minutes Intravenous Every 12 hours 03/16/23 1322     03/16/23 0915  ceFEPIme (MAXIPIME) 2 g in sodium chloride 0.9 % 100 mL IVPB  Status:  Discontinued        2 g 200 mL/hr over 30 Minutes Intravenous Every 12 hours 03/16/23 0904 03/16/23 1322   03/15/23 1800  ceFEPIme (MAXIPIME) 2 g in sodium chloride 0.9 % 100 mL IVPB  Status:  Discontinued        2 g 200 mL/hr over 30 Minutes Intravenous Every 24 hours 03/14/23 2010 03/16/23 0904   03/14/23 1954  vancomycin variable dose per unstable renal function (pharmacist dosing)  Status:  Discontinued         Does not apply See admin instructions 03/14/23 1954 03/15/23 0744   03/14/23 1930  vancomycin (VANCOCIN) IVPB 1000 mg/200 mL premix        1,000 mg 200 mL/hr over 60 Minutes Intravenous  Once 03/14/23 1842 03/15/23 0700   03/14/23 1700  vancomycin (VANCOCIN) IVPB 1000 mg/200 mL premix        1,000 mg 200 mL/hr over 60 Minutes Intravenous  Once 03/14/23 1652 03/15/23 0700   03/14/23 1700  ceFEPIme (MAXIPIME) 2 g in sodium chloride 0.9 % 100 mL IVPB        2 g 200 mL/hr over 30 Minutes Intravenous  Once 03/14/23 1652 03/15/23 0700         Subjective: Sleeping but easily arousable.  Daughter at bedside stated she ate some of her breakfast today.  Patient denies any chest pain or shortness of breath.  No abdominal pain.  Noted to tolerate BiPAP overnight.    Objective: Vitals:   03/21/23 0600 03/21/23 0700 03/21/23 0731 03/21/23 1016  BP: (!) 161/113 (!) 142/66  (!) 138/57  Pulse: (!) 59 (!) 57    Resp: 14 (!) 22    Temp:      TempSrc:      SpO2: 97% 97% 96%   Weight:      Height:        Intake/Output Summary (Last 24 hours) at 03/21/2023 1020 Last data filed at 03/21/2023 0844 Gross per 24 hour  Intake 2337.78 ml  Output 2250 ml  Net 87.78 ml   Filed Weights   03/14/23 1803 03/17/23 1800  Weight: 116.6 kg 119.8 kg    Examination:  General exam: Alert.  Oral thrush  improved.  Respiratory system: Some scattered crackles/coarse breath sounds.  No significant wheezing noted.  Fair air movement.  Speaking in full sentences.  Cardiovascular system: RRR no murmurs rubs or gallops.  No JVD.  Trace bilateral lower extremity edema. Gastrointestinal system: Abdomen is soft, obese, nontender, nondistended, positive bowel sounds.  No rebound.  No guarding.  Central nervous system: Alert and oriented.  Moving extremities spontaneously.  No focal neurological deficits. Extremities: Bilateral upper extremities with significantly improved edema.  No significant pitting lower extremity edema.  Skin: Ecchymosis on left hand and right hand.  Psychiatry: Judgement and insight poor to fair.  Mood and affect appropriate.     Data Reviewed: I have personally reviewed following labs and imaging studies  CBC: Recent Labs  Lab 03/14/23 1350 03/15/23 0338 03/17/23 0449 03/18/23 0335 03/19/23 0318 03/20/23 0307 03/21/23 0303  WBC 13.9*   < > 7.8 5.4 9.5 8.3 8.0  NEUTROABS 12.3*  --   --   --   --   --   --   HGB 12.1   < > 13.2 11.9* 11.8* 11.3* 12.4  HCT 42.7   < > 47.7* 44.0 42.9 42.1 46.3*  MCV 90.5   < > 91.4 92.6 93.5 94.4 93.7  PLT 220   < > 183 218 229 232 231   < > = values in this interval not displayed.    Basic Metabolic Panel: Recent Labs  Lab 03/17/23 0449 03/18/23 0335 03/18/23 0347 03/18/23 0845 03/19/23 0318 03/20/23 0307 03/21/23 0303  NA 138 139 140  --  145 153* 146*  K 5.3* 5.6* 5.5* 5.9* 4.7 4.7 5.4*  CL 104 107 106  --  110 117* 111  CO2 26 25 27   --  28 30 28   GLUCOSE 114* 279* 280*  --  281* 193* 261*  BUN 37* 32* 32*  --  39* 44* 45*  CREATININE 0.99 0.96 0.96  --  0.95 0.82 0.87  CALCIUM 8.1* 8.1* 8.2*  --  8.1* 8.4* 8.3*  MG 2.7*  --   --   --  2.5*  --   --   PHOS  --   --  3.5  --   --  2.1* 3.0    GFR: Estimated Creatinine Clearance: 57.2 mL/min (by C-G formula based on SCr of 0.87 mg/dL).  Liver Function Tests: Recent  Labs  Lab 03/14/23 1457 03/16/23 0419 03/17/23 0449 03/18/23 0347 03/20/23 0307  AST 14* 10* 12*  --   --   ALT 15 12 13   --   --   ALKPHOS 64 55 55  --   --   BILITOT 0.5 0.6 0.4  --   --   PROT 6.5 6.2* 6.7  --   --   ALBUMIN 2.1* 2.0* 2.2* 2.1* 2.5*    CBG: Recent Labs  Lab 03/19/23 2018 03/20/23 0813 03/20/23 1144 03/20/23 1558 03/21/23 0751  GLUCAP 225* 152* 220* 308* 195*     Recent Results (from the past 240 hour(s))  Resp panel by RT-PCR (RSV, Flu A&B, Covid) Anterior Nasal Swab     Status: None   Collection Time: 03/14/23  1:51 PM   Specimen: Anterior Nasal Swab  Result Value Ref Range Status   SARS Coronavirus 2 by RT PCR NEGATIVE NEGATIVE Final    Comment: (NOTE) SARS-CoV-2 target nucleic acids are NOT DETECTED.  The SARS-CoV-2 RNA is generally detectable in upper respiratory specimens during the acute phase of infection. The lowest concentration of SARS-CoV-2 viral copies this assay can detect is 138 copies/mL. A negative result does not preclude SARS-Cov-2 infection and should not be used as the sole basis for treatment or other patient management decisions. A negative result may occur with  improper specimen collection/handling, submission of specimen other than nasopharyngeal swab, presence of viral mutation(s) within the areas targeted by this assay, and inadequate number of viral copies(<138 copies/mL). A negative result must be combined with clinical observations, patient history, and epidemiological information. The expected result is Negative.  Fact Sheet for Patients:  BloggerCourse.com  Fact Sheet for Healthcare Providers:  SeriousBroker.it  This test is no t yet approved or cleared by the [place] FDA and  has been authorized for detection and/or diagnosis of SARS-CoV-2 by FDA under an Emergency Use Authorization (EUA). This EUA will remain  in effect (meaning this test can be used)  for the duration of the COVID-19 declaration under Section 564(b)(1) of the Act, 21 U.S.C.section 360bbb-3(b)(1), unless the authorization is terminated  or revoked sooner.       Influenza A by PCR NEGATIVE NEGATIVE Final   Influenza B by PCR NEGATIVE NEGATIVE Final    Comment: (NOTE) The Xpert Xpress SARS-CoV-2/FLU/RSV plus assay is intended as an aid in the diagnosis of influenza from Nasopharyngeal swab specimens and should not be used as a sole basis for treatment. Nasal washings and aspirates are unacceptable for Xpert Xpress SARS-CoV-2/FLU/RSV testing.  Fact Sheet for Patients: BloggerCourse.com  Fact Sheet for Healthcare Providers: SeriousBroker.it  This test is not yet approved or cleared by the [place] FDA and has been authorized for detection and/or diagnosis of SARS-CoV-2 by FDA under an Emergency Use Authorization (EUA). This EUA will remain in effect (meaning this test can be used) for the duration of the COVID-19 declaration under Section 564(b)(1) of the Act, 21 U.S.C. section 360bbb-3(b)(1), unless the authorization is terminated or revoked.     Resp Syncytial Virus by PCR NEGATIVE NEGATIVE Final    Comment: (NOTE) Fact Sheet for Patients: BloggerCourse.com  Fact Sheet for Healthcare Providers: SeriousBroker.it  This test is not yet approved or cleared by the [place] FDA and has been authorized for detection and/or diagnosis of SARS-CoV-2 by FDA under an Emergency Use Authorization (EUA). This EUA will remain in effect (meaning this test can be used) for the duration of the COVID-19 declaration under Section 564(b)(1) of the Act, 21 U.S.C. section 360bbb-3(b)(1), unless the authorization is terminated or revoked.  Performed at [facility], [address] [place], [place] [address]   Culture, blood (routine x 2)     Status:  None   Collection Time: 03/14/23  1:51 PM   Specimen: BLOOD  Result Value Ref Range Status   Specimen Description   Final    BLOOD RIGHT ANTECUBITAL Performed at [facility], [address], [place], [place] [address]    Special Requests   Final    BOTTLES DRAWN AEROBIC AND ANAEROBIC Blood Culture adequate volume Performed at [facility], [address], [place], [place] [address]    Culture   Final    NO GROWTH 5 DAYS Performed at [facility], [address] [place], [place] [address]    Report Status 03/19/2023 FINAL  Final  Culture, blood (routine x 2)     Status: Abnormal   Collection Time: 03/14/23  2:57 PM   Specimen: BLOOD RIGHT ARM  Result Value Ref Range Status   Specimen Description   Final    BLOOD RIGHT ARM Performed at [facility], [address], [place], [place] [address]    Special Requests   Final    BOTTLES DRAWN AEROBIC AND ANAEROBIC Blood Culture adequate volume Performed at [facility], [address] [place], [place] [address]    Culture  Setup Time   Final    GRAM POSITIVE COCCI IN CLUSTERS AEROBIC BOTTLE ONLY CRITICAL RESULT CALLED TO, READ BACK BY AND VERIFIED WITH: PHARMD LEANN POINDEXTER  ON 03/18/23 @ 2233 BY DRT    Culture (A)  Final    STAPHYLOCOCCUS EPIDERMIDIS THE SIGNIFICANCE OF ISOLATING THIS ORGANISM FROM A SINGLE SET OF BLOOD CULTURES WHEN MULTIPLE SETS ARE DRAWN IS UNCERTAIN. PLEASE NOTIFY THE MICROBIOLOGY DEPARTMENT WITHIN ONE WEEK IF SPECIATION AND SENSITIVITIES ARE REQUIRED. Performed at [facility], [address], [place], [place] [address]    Report Status 03/19/2023 FINAL  Final  Blood Culture ID Panel (Reflexed)     Status: Abnormal   Collection Time: 03/14/23  2:57 PM  Result Value Ref Range Status   Enterococcus faecalis NOT DETECTED NOT DETECTED Final   Enterococcus Faecium NOT DETECTED NOT DETECTED Final   Listeria monocytogenes NOT DETECTED  NOT DETECTED Final   Staphylococcus species DETECTED (A) NOT DETECTED Final    Comment: CRITICAL RESULT CALLED TO, READ BACK BY AND VERIFIED WITH: PHARMD LEANN POINDEXTER ON 03/18/23 @ 2233 BY DRT    Staphylococcus aureus (BCID) NOT DETECTED NOT DETECTED Final   Staphylococcus epidermidis DETECTED (A) NOT DETECTED Final    Comment: Methicillin (oxacillin) resistant coagulase negative staphylococcus. Possible blood culture contaminant (unless isolated from more than one blood culture draw or clinical case suggests pathogenicity). No antibiotic treatment is indicated for blood  culture contaminants. CRITICAL RESULT CALLED TO, READ BACK BY AND VERIFIED WITH: PHARMD LEANN POINDEXTER ON 03/18/23 @ 2233 BY DRT    Staphylococcus lugdunensis NOT DETECTED NOT DETECTED Final   Streptococcus species NOT DETECTED NOT DETECTED Final   Streptococcus agalactiae NOT DETECTED NOT DETECTED Final   Streptococcus pneumoniae NOT DETECTED NOT DETECTED Final   Streptococcus pyogenes NOT DETECTED NOT DETECTED Final   A.calcoaceticus-baumannii NOT DETECTED NOT DETECTED Final   Bacteroides fragilis NOT DETECTED NOT DETECTED Final   Enterobacterales NOT DETECTED NOT DETECTED Final   Enterobacter cloacae complex NOT DETECTED NOT DETECTED Final   Escherichia coli NOT DETECTED NOT DETECTED Final   Klebsiella aerogenes NOT DETECTED NOT DETECTED Final   Klebsiella oxytoca NOT DETECTED NOT DETECTED Final   Klebsiella pneumoniae NOT DETECTED NOT DETECTED Final   Proteus species NOT DETECTED NOT DETECTED Final   Salmonella species NOT DETECTED NOT DETECTED Final   Serratia marcescens NOT DETECTED NOT DETECTED Final   Haemophilus influenzae NOT DETECTED NOT DETECTED Final   Neisseria meningitidis NOT DETECTED NOT DETECTED Final   Pseudomonas aeruginosa NOT DETECTED NOT DETECTED Final   Stenotrophomonas maltophilia NOT DETECTED NOT DETECTED Final   Candida albicans NOT DETECTED NOT DETECTED Final   Candida auris NOT  DETECTED NOT DETECTED Final   Candida glabrata NOT DETECTED NOT DETECTED Final   Candida krusei NOT DETECTED NOT DETECTED Final   Candida parapsilosis NOT DETECTED NOT DETECTED Final   Candida tropicalis NOT DETECTED NOT DETECTED Final   Cryptococcus neoformans/gattii NOT DETECTED NOT DETECTED Final   Methicillin resistance mecA/C DETECTED (A) NOT DETECTED Final    Comment: CRITICAL RESULT CALLED TO, READ BACK BY AND VERIFIED WITH: PHARMD LEANN POINDEXTER ON 03/18/23 @ 2233 BY DRT Performed at [facility], [address] [place], [place] [address]   MRSA Next Gen by PCR, Nasal     Status: None   Collection Time: 03/14/23 10:15 PM   Specimen: Nasal Mucosa; Nasal Swab  Result Value Ref Range Status   MRSA by PCR Next Gen NOT DETECTED NOT DETECTED Final    Comment: (NOTE) The GeneXpert MRSA Assay (FDA approved for NASAL specimens only), is one component of a comprehensive MRSA colonization surveillance program. It is  not intended to diagnose MRSA infection nor to guide or monitor treatment for MRSA infections. Test performance is not FDA approved in patients less than 29 years old. Performed at [facility], [address] [place], [place] [address]   Urine Culture (for pregnant, neutropenic or urologic patients or patients with an indwelling urinary catheter)     Status: Abnormal   Collection Time: 03/17/23  3:18 PM   Specimen: Urine, Catheterized  Result Value Ref Range Status   Specimen Description   Final    URINE, CATHETERIZED Performed at [facility], [address], [place], [place] [address]    Special Requests   Final    NONE Performed at [facility], [address], [place], [place] [address]    Culture 30,000 COLONIES/mL YEAST (A)  Final   Report Status 03/18/2023 FINAL  Final         Radiology Studies: No results found.      Scheduled Meds:  amLODipine  5 mg Oral Daily   budesonide  (PULMICORT) nebulizer solution  0.5 mg Nebulization BID   Chlorhexidine Gluconate Cloth  6 each Topical Daily   cyanocobalamin  1,000 mcg Subcutaneous Q1200   donepezil  10 mg Oral QHS   enoxaparin (LOVENOX) injection  1 mg/kg Subcutaneous Q12H   feeding supplement (GLUCERNA SHAKE)  237 mL Oral BID BM   fluticasone  2 spray Each Nare Daily   Gerhardt's butt cream   Topical BID   insulin aspart  0-9 Units Subcutaneous TID WC   insulin glargine-yfgn  10 Units Subcutaneous Daily   ipratropium-albuterol  3 mL Nebulization Q6H   lactulose  20 g Oral BID   loratadine  10 mg Oral Daily   nystatin  5 mL Oral QID   mouth rinse  15 mL Mouth Rinse 4 times per day   [START ON 03/22/2023] pantoprazole  40 mg Oral Daily   predniSONE  40 mg Oral Q breakfast   Continuous Infusions:  sodium chloride 50 mL/hr at 03/21/23 1015   cefTRIAXone (ROCEPHIN)  IV Stopped (03/20/23 2230)   metronidazole Stopped (03/21/23 0313)     LOS: 7 days    Time spent: 40 minutes    Ramiro Harvest, MD [facility]   To contact the attending provider between 7A-7P or the covering provider during after hours 7P-7A, please log into the web site [email or web address] and access using universal [place] password for that web site. If you do not have the password, please call the hospital operator.  03/21/2023, 10:20 AM

## 2023-03-21 NOTE — Plan of Care (Signed)
  Problem: Education: Goal: Knowledge of General Education information will improve Description: Including pain rating scale, medication(s)/side effects and non-pharmacologic comfort measures Outcome: Progressing   Problem: Clinical Measurements: Goal: Diagnostic test results will improve Outcome: Progressing   Problem: Nutrition: Goal: Adequate nutrition will be maintained Outcome: Progressing   Problem: Elimination: Goal: Will not experience complications related to urinary retention Outcome: Progressing   Problem: Clinical Measurements: Goal: Ability to maintain a body temperature in the normal range will improve Outcome: Progressing   Problem: Nutritional: Goal: Maintenance of adequate nutrition will improve Outcome: Progressing

## 2023-03-21 NOTE — Discharge Instructions (Signed)
Information on my medicine - ELIQUIS (apixaban)  Why was Eliquis prescribed for you? Eliquis was prescribed to treat blood clots that may have been found in the veins of your legs (deep vein thrombosis) or in your lungs (pulmonary embolism) and to reduce the risk of them occurring again.  What do You need to know about Eliquis ? The starting dose is 10 mg (two 5 mg tablets) taken TWICE daily for the FIRST SEVEN (7) DAYS, then on (enter date)  03/28/2023 PM (bedtime)  the dose is reduced to ONE 5 mg tablet taken TWICE daily.  Eliquis may be taken with or without food.   Try to take the dose about the same time in the morning and in the evening. If you have difficulty swallowing the tablet whole please discuss with your pharmacist how to take the medication safely.  Take Eliquis exactly as prescribed and DO NOT stop taking Eliquis without talking to the doctor who prescribed the medication.  Stopping may increase your risk of developing a new blood clot.  Refill your prescription before you run out.  After discharge, you should have regular check-up appointments with your healthcare provider that is prescribing your Eliquis.    What do you do if you miss a dose? If a dose of ELIQUIS is not taken at the scheduled time, take it as soon as possible on the same day and twice-daily administration should be resumed. The dose should not be doubled to make up for a missed dose.  Important Safety Information A possible side effect of Eliquis is bleeding. You should call your healthcare provider right away if you experience any of the following: Bleeding from an injury or your nose that does not stop. Unusual colored urine (red or dark brown) or unusual colored stools (red or black). Unusual bruising for unknown reasons. A serious fall or if you hit your head (even if there is no bleeding).  Some medicines may interact with Eliquis and might increase your risk of bleeding or clotting while on  Eliquis. To help avoid this, consult your healthcare provider or pharmacist prior to using any new prescription or non-prescription medications, including herbals, vitamins, non-steroidal anti-inflammatory drugs (NSAIDs) and supplements.  This website has more information on Eliquis (apixaban): http://www.eliquis.com/eliquis/home

## 2023-03-21 NOTE — Progress Notes (Addendum)
PHARMACY - ANTICOAGULATION CONSULT NOTE  Pharmacy Consult for Lovenox>>Eliquis Indication: DVT and PE  Allergies  Allergen Reactions   Codeine Hives   Penicillins     REACTION: rash   Morphine Rash    REACTION: pt unsure of reaction    Patient Measurements: Height: 5\' 1"  (154.9 cm) Weight: 119.8 kg (264 lb 1.8 oz) IBW/kg (Calculated) : 47.8  Vital Signs: Temp: 97.8 F (36.6 C) (10/27 0400) Temp Source: Axillary (10/27 0400) BP: 142/66 (10/27 0700) Pulse Rate: 57 (10/27 0700)  Labs: Recent Labs    03/19/23 0318 03/20/23 0307 03/21/23 0303  HGB 11.8* 11.3* 12.4  HCT 42.9 42.1 46.3*  PLT 229 232 231  CREATININE 0.95 0.82 0.87    Estimated Creatinine Clearance: 57.2 mL/min (by C-G formula based on SCr of 0.87 mg/dL).   Medical History: Past Medical History:  Diagnosis Date   Acute sinusitis, unspecified 05/28/2008   ANEMIA-IRON DEFICIENCY 10/26/2008   ANXIETY 10/06/2007   CHOLELITHIASIS 10/06/2007   COLONIC POLYPS, HX OF 01/26/2007   DIABETES MELLITUS, TYPE II 10/06/2007   DIVERTICULOSIS, COLON 01/26/2007   ECCHYMOSES, SPONTANEOUS 06/04/2010   GERD 01/26/2007   Heart murmur    slight heart murmur   HERNIATED DISC 01/26/2007   HYPERLIPIDEMIA 10/06/2007   HYPERTENSION 01/26/2007   LOW BACK PAIN 01/29/2007   MENOPAUSAL DISORDER 10/26/2008   OSTEOPOROSIS 06/04/2010   SHOULDER PAIN, LEFT 10/06/2007   Stroke (HCC)    TIA's     Assessment: AC/Heme: loven 30 for crcl <30 >> UFH infusion with new PE on VQ scan and acute LLE DVT - 10/21 V/Q: Matched segmental and multiple subsegmental perfusion defects. The examination is positive for pulmonary embolism in the appropriate clinical scenario. - 10/22 LE doppler: acute deep vein thrombosis involving the left  popliteal vein.  - 10/22 UE doppler: neg - 10/24: Change IV Hep>LMWH - 10/27: Hgb up to 12.4, Plts stable and WNL  Goal of Therapy:  Anti-Xa level 0.6-1 units/ml 4hrs after LMWH dose given Monitor platelets by anticoagulation  protocol: Yes   Plan:  Lovenox 1 mg/kg BID on 10/24= 120mg /12h, already had dose 10/27 AM. - Tonight start Eliquis 10mg  BID x 7d, then 5mg  BID - $0 copay for Eliquis and xarelto   Geniyah Eischeid S. Merilynn Finland, PharmD, BCPS Clinical Staff Pharmacist [email or web address] Merilynn Finland, [employer] 03/21/2023,8:57 AM

## 2023-03-22 ENCOUNTER — Encounter (HOSPITAL_COMMUNITY): Payer: Self-pay | Admitting: Internal Medicine

## 2023-03-22 DIAGNOSIS — G4733 Obstructive sleep apnea (adult) (pediatric): Secondary | ICD-10-CM

## 2023-03-22 DIAGNOSIS — J189 Pneumonia, unspecified organism: Secondary | ICD-10-CM | POA: Diagnosis not present

## 2023-03-22 DIAGNOSIS — E875 Hyperkalemia: Secondary | ICD-10-CM | POA: Diagnosis not present

## 2023-03-22 DIAGNOSIS — F0390 Unspecified dementia without behavioral disturbance: Secondary | ICD-10-CM | POA: Diagnosis not present

## 2023-03-22 DIAGNOSIS — J984 Other disorders of lung: Secondary | ICD-10-CM | POA: Diagnosis not present

## 2023-03-22 DIAGNOSIS — I2699 Other pulmonary embolism without acute cor pulmonale: Secondary | ICD-10-CM | POA: Diagnosis not present

## 2023-03-22 HISTORY — DX: Obstructive sleep apnea (adult) (pediatric): G47.33

## 2023-03-22 LAB — CBC WITH DIFFERENTIAL/PLATELET
Abs Immature Granulocytes: 0.09 10*3/uL — ABNORMAL HIGH (ref 0.00–0.07)
Basophils Absolute: 0 10*3/uL (ref 0.0–0.1)
Basophils Relative: 0 %
Eosinophils Absolute: 0 10*3/uL (ref 0.0–0.5)
Eosinophils Relative: 0 %
HCT: 42.4 % (ref 36.0–46.0)
Hemoglobin: 11.2 g/dL — ABNORMAL LOW (ref 12.0–15.0)
Immature Granulocytes: 1 %
Lymphocytes Relative: 9 %
Lymphs Abs: 0.7 10*3/uL (ref 0.7–4.0)
MCH: 25.3 pg — ABNORMAL LOW (ref 26.0–34.0)
MCHC: 26.4 g/dL — ABNORMAL LOW (ref 30.0–36.0)
MCV: 95.9 fL (ref 80.0–100.0)
Monocytes Absolute: 0.5 10*3/uL (ref 0.1–1.0)
Monocytes Relative: 7 %
Neutro Abs: 6.1 10*3/uL (ref 1.7–7.7)
Neutrophils Relative %: 83 %
Platelets: 189 10*3/uL (ref 150–400)
RBC: 4.42 MIL/uL (ref 3.87–5.11)
RDW: 17.1 % — ABNORMAL HIGH (ref 11.5–15.5)
WBC: 7.4 10*3/uL (ref 4.0–10.5)
nRBC: 0 % (ref 0.0–0.2)

## 2023-03-22 LAB — GLUCOSE, CAPILLARY
Glucose-Capillary: 145 mg/dL — ABNORMAL HIGH (ref 70–99)
Glucose-Capillary: 143 mg/dL — ABNORMAL HIGH (ref 70–99)
Glucose-Capillary: 250 mg/dL — ABNORMAL HIGH (ref 70–99)
Glucose-Capillary: 336 mg/dL — ABNORMAL HIGH (ref 70–99)

## 2023-03-22 LAB — RENAL FUNCTION PANEL
Albumin: 3.2 g/dL — ABNORMAL LOW (ref 3.5–5.0)
Anion gap: 6 (ref 5–15)
BUN: 43 mg/dL — ABNORMAL HIGH (ref 8–23)
CO2: 29 mmol/L (ref 22–32)
Calcium: 8.7 mg/dL — ABNORMAL LOW (ref 8.9–10.3)
Chloride: 106 mmol/L (ref 98–111)
Creatinine, Ser: 0.77 mg/dL (ref 0.44–1.00)
GFR, Estimated: >60 mL/min (ref >60)
Glucose, Bld: 239 mg/dL — ABNORMAL HIGH (ref 70–99)
Phosphorus: 3.3 mg/dL (ref 2.5–4.6)
Potassium: 5.3 mmol/L — ABNORMAL HIGH (ref 3.5–5.1)
Sodium: 141 mmol/L (ref 135–145)

## 2023-03-22 LAB — AMMONIA: Ammonia: 32 umol/L (ref 9–35)

## 2023-03-22 MED ORDER — SODIUM ZIRCONIUM CYCLOSILICATE 10 G PO PACK
10.0000 g | PACK | Freq: Once | ORAL | Status: AC
  Administered 2023-03-22: 10 g via ORAL
  Filled 2023-03-22: qty 1

## 2023-03-22 MED ORDER — IPRATROPIUM-ALBUTEROL 0.5-2.5 (3) MG/3ML IN SOLN
3.0000 mL | Freq: Four times a day (QID) | RESPIRATORY_TRACT | Status: DC
  Administered 2023-03-22 (×2): 3 mL via RESPIRATORY_TRACT
  Filled 2023-03-22 (×3): qty 3

## 2023-03-22 MED ORDER — IPRATROPIUM-ALBUTEROL 0.5-2.5 (3) MG/3ML IN SOLN
3.0000 mL | Freq: Three times a day (TID) | RESPIRATORY_TRACT | Status: DC
  Administered 2023-03-22 – 2023-03-23 (×4): 3 mL via RESPIRATORY_TRACT
  Filled 2023-03-22 (×4): qty 3

## 2023-03-22 MED ORDER — PREDNISONE 20 MG PO TABS
20.0000 mg | ORAL_TABLET | Freq: Every day | ORAL | Status: AC
  Administered 2023-03-23 – 2023-03-25 (×3): 20 mg via ORAL
  Filled 2023-03-22 (×3): qty 1

## 2023-03-22 MED ORDER — SODIUM ZIRCONIUM CYCLOSILICATE 5 G PO PACK
5.0000 g | PACK | Freq: Once | ORAL | Status: AC
  Administered 2023-03-22: 5 g via ORAL
  Filled 2023-03-22: qty 1

## 2023-03-22 NOTE — Progress Notes (Signed)
   03/22/23 0747  BiPAP/CPAP/SIPAP  BiPAP/CPAP/SIPAP Pt Type Adult  BiPAP/CPAP/SIPAP V60  Mask Type Full face mask  Mask Size Large  Set Rate 14 breaths/min  Respiratory Rate 21 breaths/min  IPAP 18 cmH20  EPAP 10 cmH2O  FiO2 (%) 40 % (was 100% on 50%- rn aware)  Minute Ventilation 10  Leak 13  Peak Inspiratory Pressure (PIP) 19  Tidal Volume (Vt) 500  Patient Home Equipment No  Auto Titrate No  Press High Alarm 30 cmH2O  Press Low Alarm 5 cmH2O  BiPAP/CPAP /SiPAP Vitals  Temp 97.6 F (36.4 C)  Pulse Rate (!) 49  Resp (!) 21  BP (!) 163/37  SpO2 95 %  Bilateral Breath Sounds Clear;Diminished  MEWS Score/Color  MEWS Score 2  MEWS Score Color Yellow

## 2023-03-22 NOTE — Progress Notes (Signed)
   03/22/23 2330  BiPAP/CPAP/SIPAP  BiPAP/CPAP/SIPAP Pt Type Adult  BiPAP/CPAP/SIPAP V60  Mask Type Full face mask  Mask Size Large  Set Rate 14 breaths/min  Respiratory Rate 24 breaths/min  IPAP 18 cmH20  EPAP 10 cmH2O  FiO2 (%) (S)  40 % (sats 98%)  Minute Ventilation 10.3  Leak 5  Peak Inspiratory Pressure (PIP) 18  Tidal Volume (Vt) 406  Patient Home Equipment No  Auto Titrate No  Press High Alarm 30 cmH2O  Press Low Alarm 5 cmH2O  Oxygen Percent 40 %

## 2023-03-22 NOTE — Plan of Care (Signed)
°  Problem: Clinical Measurements: °Goal: Ability to maintain clinical measurements within normal limits will improve °Outcome: Progressing °Goal: Will remain free from infection °Outcome: Progressing °Goal: Diagnostic test results will improve °Outcome: Progressing °Goal: Respiratory complications will improve °Outcome: Progressing °  °

## 2023-03-22 NOTE — Progress Notes (Signed)
Removed PT from BiPAP and placed on 3 LPM and decreased to 2 LPM for Sp02 goal >=92%- currently 95%. RN aware.

## 2023-03-22 NOTE — Progress Notes (Signed)
NAME:  Kathy Thomas, MRN:  [id number], DOB:  March 27, 1935, LOS: 8 ADMISSION DATE:  03/14/2023, CONSULTATION DATE:  10/22 REFERRING MD:  Dr. Rhona Leavens, CHIEF COMPLAINT: Cavitary pneumonia   History of Present Illness:  65F with PMH as below, which is significant for DM, HTN, dementia, and stroke. She resides in SNF where she was started on ertapenem for pneumonia after developing hypoxia and shortness of breath.  She did not have immediate improvement.  On 10/20 in the a.m. hours she was noted to be unresponsive with oxygen saturations in the 60s.  This was ultimately felt to be secondary to hypoglycemia and her responsiveness did somewhat improve with treatment.  She was brought to the emergency department for ongoing evaluation.  Workup for hypoxia included a CT scan of the chest which demonstrated cavitary lesion in the right middle lobe.  There was concern for PE versus atypical pneumonia.  VQ scan was done and was concerning for pulmonary embolism.  She was initiated on treatment with empiric antibiotics as well as systemic anticoagulation.  PCCM was consulted for cavitary pneumonia.  Pertinent  Medical History   has a past medical history of Acute sinusitis, unspecified (05/28/2008), ANEMIA-IRON DEFICIENCY (10/26/2008), ANXIETY (10/06/2007), CHOLELITHIASIS (10/06/2007), COLONIC POLYPS, HX OF (01/26/2007), DIABETES MELLITUS, TYPE II (10/06/2007), DIVERTICULOSIS, COLON (01/26/2007), ECCHYMOSES, SPONTANEOUS (06/04/2010), GERD (01/26/2007), Heart murmur, HERNIATED DISC (01/26/2007), HYPERLIPIDEMIA (10/06/2007), HYPERTENSION (01/26/2007), LOW BACK PAIN (01/29/2007), MENOPAUSAL DISORDER (10/26/2008), OSTEOPOROSIS (06/04/2010), SHOULDER PAIN, LEFT (10/06/2007), and Stroke (HCC).   Significant Hospital Events: Including procedures, antibiotic start and stop dates in addition to other pertinent events   10/20 admit for pneumonia 10/21 VQ + for PE 10/22 PCCM consult for cavitary pneumonia 10/23 tx to SDU for BiPAP in the setting  of lethargy and hypercarbia 10/24 Tolerated BiPAP most of the night. More wakeful this morning 10/25 no acute events overnight, utilize BiPAP  Interim History / Subjective:  No acute events overnight. Fair compliance with at bedtime BiPAP over the weekend. Gets drowsy pretty quickly without out.   Objective   Blood pressure (!) 163/37, pulse (!) 49, temperature 97.6 F (36.4 C), temperature source Axillary, resp. rate (!) 21, height 5\' 1"  (1.549 m), weight 119.8 kg, SpO2 95%.    FiO2 (%):  [40 %-50 %] 40 %   Intake/Output Summary (Last 24 hours) at 03/22/2023 0841 Last data filed at 03/22/2023 0327 Gross per 24 hour  Intake 1248.3 ml  Output 650 ml  Net 598.3 ml   Filed Weights   03/14/23 1803 03/17/23 1800  Weight: 116.6 kg 119.8 kg    Examination:  General:  elderly female in NAD on BiPAP Neuro:  awake, alert, cooperative with exame.  HEENT:  [place]/AT, PERRL, no JVD Cardiovascular:  RRR, no MRG Lungs:  Distant otherwise clear.  Abdomen:  Soft, non-distended, non-tender.  Musculoskeletal:  No acute deformity or ROM limitation Skin:  Grossly intact. MMM.    Resolved Hospital Problem list     Assessment & Plan:  Acute respiratory failure with hypercarbia -10/23 ABG with pH 7.3 and CO2 in the 60s.  Overnight staff noted apneic periods despite BiPAP. She likely has undiagnosed sleep disordered breathing.  Cavitary pneumonia -Radiologist read concerning for pulmonary infarct from PE or atypical pneumonia. Daughter describes possible aspiration. V/Q c/w PE as well.   Pulmonary embolism/DVT L popliteal -Patient sits in wheelchair all day at facility per daughter P: Continue empiric antibiotics x 4 weeks of total therapy BC with 1/4 bottler staph epi, likely contaminant.  Will  need repeat outpatient imaging Aspiration precautions Will possibly need NIV at discharge, can determine candidacy as discharge grows closer. Will need to give her 24 hours off BiPAP and check ABG in  AM.  Anticoagulation per primary/pharmacy Continue prednisone taper.   Goals of care:  - Discussed with daughter. Patient has previously been DNR/DNI. Family would like to keep full code for now. Discussed pros/cons of code status changes a couple times over the past week. May be reasonable to involve palliative care team if family is supportive.  Per primary: AKI HTN Dementia Hypoglycemia   Best Practice (right click and "Reselect all SmartList Selections" daily)   Per primary  Signature:      Joneen Roach, AGACNP-BC [facility]  See [employer] for personal pager PCCM on call pager [phone] until 7pm. Please call Elink 7p-7a. [phone]  03/22/2023 8:47 AM

## 2023-03-22 NOTE — Progress Notes (Addendum)
PROGRESS NOTE    GWEN WOGOMAN  HQI:[id number] DOB: 1934-09-10 DOA: 03/14/2023 PCP: Marden Noble, MD (Inactive)   Chief Complaint  Patient presents with   Hypoglycemia   Shortness of Breath    Brief Narrative:  88 y.o. female with medical history significant of dementia, hypertension who presented to the emergency department due to shortness of breath.  Patient was found to be hypoxic 3 days ago and underwent chest x-ray which showed concern for community-acquired pneumonia.  She was started on ertapenem and received 3 doses.  By the nursing facility she developed tachypnea and shortness of breath so EMS was called to bring her to the emergency department further assessment.  On EMS arrival she was found to be hypoglycemic.  She was brought to the ER where she was hypoxic requiring up to 6 L nasal cannula. Pt was admitted for concerns of cavitary PNA    Assessment & Plan:   Principal Problem:   Cavitary pneumonia Active Problems:   Pulmonary embolism (HCC)   DVT (deep venous thrombosis) (HCC)   DM type 2 (diabetes mellitus, type 2) (HCC)   HTN (hypertension)   Dementia without behavioral disturbance (HCC)   Diabetes mellitus without complication (HCC)   Pressure injury of skin   Hyperkalemia   AKI (acute kidney injury) (HCC)   Oral thrush   Hypernatremia   Dysphagia   OSA (obstructive sleep apnea)   #1 cavitary pneumonia -Patient noted to have presented with altered mental status, tachypnea, worsening shortness of breath and noted to have been hypoxic 3 days prior to admission underwent chest x-ray concerning for community-acquired pneumonia. -On arrival to the ED patient noted to be hypoxic requiring up to 6 L nasal cannula and admitted for concerns for cavitary pneumonia. -CT chest done with findings notable for a large masslike consolidation of the RML with central groundglass, cavitation, peripheral ring of dense consolidation concerning for pulmonary infarct versus  atypical cavitary pneumonia.  Small right pleural effusion noted. -Patient noted to have completed ertapenem prior to admission. -Patient drowsy/lethargic the evening of 03/16/2023 and in the morning of 03/17/2023. -Patient was placed on BiPAP 03/17/2023 with clinical improvement currently on 4 L nasal cannula with sats of 96-98%. -Status post IV albumin and IV Lasix 03/18/2023 with good urine output. -Patient seen in consultation by pulmonary who assessed the patient and recommending continuation of antibiotics of IV Rocephin and Flagyl and recommended total of 4 to 6 weeks of antibiotics await reimaging in the outpatient setting. -Patient assessed by SLP. -Supportive care.  2.  PE/left popliteal DVT -Noted on V/Q scan with findings confirming PE. -Lower extremity Dopplers with left lower extremity DVT. -2D echo with EF of 55 to 60%, mild concentric LVH, grade 1 DD, full study not completed as patient noted to have refused exam. -Was on IV heparin has been transitioned to full dose Lovenox. -Patient has been transition from full dose Lovenox to Eliquis on 03/21/2023. -Per pulmonary likely need lifelong DOAC.  3.  Acute metabolic encephalopathy/acute respiratory failure with hypercarbia -Likely multifactorial secondary to hypercarbia in the setting of acute illness of cavitary pneumonia, with prior history of tobacco use. -Patient with no focal neurological deficits however daughter was concerned due to patient's family history and current mental status concern for possible CVA. -CT head done on admission negative for any acute abnormalities. -Repeat head CT negative for any acute abnormalities.. -Ammonia levels slightly elevated at 41 and trended down with lactulose. -ABG with a pH of 7.3/pCO2 of 66/pO2  of 73/bicarb of 32. -Patient with clinical improvement after being placed on BiPAP yesterday with repeat ABG with a pH of 7.29/pCO2 of 64/pO2 of 66 with a bicarb of 31. -Lactulose  discontinued.   -Repeat ammonia level.   -BiPAP nightly and as needed. -Continue treatment as in problem #1 and 2. -Continue Pulmicort, Flonase, scheduled DuoNebs, Claritin. -IV Solu-Medrol has been transitioned to oral prednisone taper.  Continue PPI.  -BiPAP as needed and nightly. -Patient likely with probable OSA. -Pulmonary following and feel patient would likely need  NIVM at discharge.  4.  Hyperkalemia -Improved initially with Lokelma however potassium back up to 5.3 this morning.   -Lokelma ordered this morning will give another dose of Lokelma this afternoon.   -K-Phos has been discontinued.   -Repeat labs in the AM.   5.  AKI -Likely secondary to prerenal azotemia in the setting of ARB, diuretics.  Patient also noted to be on Jardiance prior to admission. -Urine output of 650 cc recorded over the past 24 hours. -Patient noted to have received IV albumin and Lasix 40 mg IV x 1 on 03/18/2023.. -Renal function improved.   -Saline lock IV fluids.  -Continue to hold ARB and diuretics.  6.  Oral thrush -Improved clinically.   -Nystatin swish and swallow.   7.  Hypertension -BP has improved.   -ARB, diuretics on hold. -IV hydralazine as needed. -Patient with some bradycardia early on and during the hospitalization IV Lopressor was initially held.  -Patient with some bradycardia while sleeping but improved with arousal. -IV Lopressor discontinued patient started on Norvasc 5 mg daily for better blood pressure control.  -If ongoing bradycardia while is awake may need to consider discontinuation of Norvasc which should not have affected heart rate.  8.  Cognitive impairment -Continue Aricept.  9.  Hypoglycemia/diabetes mellitus type 2 -Patient noted to have received insulin prior to admission at facility. -Hemoglobin A1c 7.1 (03/15/2023) -CBG noted at 145 this morning. -Continue to hold oral hypoglycemic agents of Amaryl and Jardiance. -Patient started on steroids and as  such monitor CBGs. -Was on D5W and changed to half-normal saline.   -Continue Semglee 10 units daily, SSI.  Change IV fluids from D5W to half-normal saline for hypernatremia.  10.  Bilateral upper extremity swelling -Likely secondary to third spacing patient with hypoalbuminemia. -Status post IV albumin, Lasix 40 mg IV x 1 with significant improvement with upper extremity swelling.   -Patient auto diuresing.  11.  Hypernatremia -Improved with D5W and half-normal saline. -Saline lock IV fluids. -Repeat labs in AM.  12.  Dysphagia -Patient initially was on dysphagia 1 diet, difficulty swallowing initially on presentation was likely due to patient's lethargy. -Patient more alert, reassessed by SLP and diet advanced to a dysphagia 3 diet. -SLP following and appreciate input and recommendations.  13.  Sinus bradycardia -Patient noted bradycardic in the mid to high 40s to 50s while on BiPAP and sleeping. -Heart rate improves with awakening/arousal. -Follow.   14.  Pressure injury buttock stage II, POA Pressure Injury 03/14/23 Buttocks Stage 2 -  Partial thickness loss of dermis presenting as a shallow open injury with a red, pink wound bed without slough. (Active)  03/14/23 1830  Location: Buttocks  Location Orientation:   Staging: Stage 2 -  Partial thickness loss of dermis presenting as a shallow open injury with a red, pink wound bed without slough.  Wound Description (Comments):   Present on Admission: Yes       DVT prophylaxis: Heparin>>>>>  Lovenox >>> Eliquis Code Status: Full Family Communication: No family at bedside.  Disposition: Remain in SDU.  Likely back to SNF when medically stable.    Status is: Inpatient Remains inpatient appropriate because: Severity of illness   Consultants:  PCCM: Dr. Isaiah Serge 03/16/2023  Procedures:  CT head 03/14/2023, 03/17/2023 CT chest 03/14/2023 Left upper extremity Dopplers 03/16/2023 Lower extremity Dopplers 03/16/2023 2D echo  03/15/2023  Antimicrobials:  Anti-infectives (From admission, onward)    Start     Dose/Rate Route Frequency Ordered Stop   03/16/23 2200  cefTRIAXone (ROCEPHIN) 2 g in sodium chloride 0.9 % 100 mL IVPB        2 g 200 mL/hr over 30 Minutes Intravenous Every 24 hours 03/16/23 1322     03/16/23 1400  metroNIDAZOLE (FLAGYL) IVPB 500 mg        500 mg 100 mL/hr over 60 Minutes Intravenous Every 12 hours 03/16/23 1322     03/16/23 0915  ceFEPIme (MAXIPIME) 2 g in sodium chloride 0.9 % 100 mL IVPB  Status:  Discontinued        2 g 200 mL/hr over 30 Minutes Intravenous Every 12 hours 03/16/23 0904 03/16/23 1322   03/15/23 1800  ceFEPIme (MAXIPIME) 2 g in sodium chloride 0.9 % 100 mL IVPB  Status:  Discontinued        2 g 200 mL/hr over 30 Minutes Intravenous Every 24 hours 03/14/23 2010 03/16/23 0904   03/14/23 1954  vancomycin variable dose per unstable renal function (pharmacist dosing)  Status:  Discontinued         Does not apply See admin instructions 03/14/23 1954 03/15/23 0744   03/14/23 1930  vancomycin (VANCOCIN) IVPB 1000 mg/200 mL premix        1,000 mg 200 mL/hr over 60 Minutes Intravenous  Once 03/14/23 1842 03/15/23 0700   03/14/23 1700  vancomycin (VANCOCIN) IVPB 1000 mg/200 mL premix        1,000 mg 200 mL/hr over 60 Minutes Intravenous  Once 03/14/23 1652 03/15/23 0700   03/14/23 1700  ceFEPIme (MAXIPIME) 2 g in sodium chloride 0.9 % 100 mL IVPB        2 g 200 mL/hr over 30 Minutes Intravenous  Once 03/14/23 1652 03/15/23 0700         Subjective: Patient asleep on BiPAP.  Arousable but drifts back off to sleep.  Noted to be bradycardic while sleeping but improves with arousal/awakening of patient.  Patient noted overnight to have some apneic episodes despite BiPAP.    Objective: Vitals:   03/22/23 0023 03/22/23 0315 03/22/23 0400 03/22/23 0747  BP:    (!) 163/37  Pulse:   (!) 56 (!) 49  Resp:   (!) 24 (!) 21  Temp: 98.4 F (36.9 C) 97.8 F (36.6 C)  97.6 F (36.4  C)  TempSrc: Axillary Axillary  Axillary  SpO2:   97% 95%  Weight:      Height:        Intake/Output Summary (Last 24 hours) at 03/22/2023 0953 Last data filed at 03/22/2023 0327 Gross per 24 hour  Intake 1244.15 ml  Output 650 ml  Net 594.15 ml   Filed Weights   03/14/23 1803 03/17/23 1800  Weight: 116.6 kg 119.8 kg    Examination:  General exam: Asleep on BiPAP.Marland Kitchen  Oral thrush improved.  Respiratory system: Scattered coarse breath sounds anterior lung fields.  No significant wheezing.  Fair air movement.  On BiPAP.  Cardiovascular system: Bradycardia.  No JVD.  No murmurs rubs or gallops.  Trace bilateral lower extremity edema. Gastrointestinal system: Abdomen is soft, obese, nontender, nondistended, positive bowel sounds.  No rebound.  No guarding.  Central nervous system: Alert and oriented.  Moving extremities spontaneously.  No focal neurological deficits. Extremities: Bilateral upper extremities with significantly improved edema.  No significant pitting lower extremity edema. Skin: Ecchymosis on left hand and right hand.  Psychiatry: Judgement and insight poor to fair.  Mood and affect appropriate.     Data Reviewed: I have personally reviewed following labs and imaging studies  CBC: Recent Labs  Lab 03/18/23 0335 03/19/23 0318 03/20/23 0307 03/21/23 0303 03/22/23 0311  WBC 5.4 9.5 8.3 8.0 7.4  NEUTROABS  --   --   --   --  6.1  HGB 11.9* 11.8* 11.3* 12.4 11.2*  HCT 44.0 42.9 42.1 46.3* 42.4  MCV 92.6 93.5 94.4 93.7 95.9  PLT 218 229 232 231 189    Basic Metabolic Panel: Recent Labs  Lab 03/17/23 0449 03/18/23 0335 03/18/23 0347 03/18/23 0845 03/19/23 0318 03/20/23 0307 03/21/23 0303 03/21/23 1148 03/22/23 0311  NA 138   < > 140  --  145 153* 146*  --  141  K 5.3*   < > 5.5*   < > 4.7 4.7 5.4* 4.7 5.3*  CL 104   < > 106  --  110 117* 111  --  106  CO2 26   < > 27  --  28 30 28   --  29  GLUCOSE 114*   < > 280*  --  281* 193* 261*  --  239*  BUN  37*   < > 32*  --  39* 44* 45*  --  43*  CREATININE 0.99   < > 0.96  --  0.95 0.82 0.87  --  0.77  CALCIUM 8.1*   < > 8.2*  --  8.1* 8.4* 8.3*  --  8.7*  MG 2.7*  --   --   --  2.5*  --   --   --   --   PHOS  --   --  3.5  --   --  2.1* 3.0  --  3.3   < > = values in this interval not displayed.    GFR: Estimated Creatinine Clearance: 62.2 mL/min (by C-G formula based on SCr of 0.77 mg/dL).  Liver Function Tests: Recent Labs  Lab 03/16/23 0419 03/17/23 0449 03/18/23 0347 03/20/23 0307 03/22/23 0311  AST 10* 12*  --   --   --   ALT 12 13  --   --   --   ALKPHOS 55 55  --   --   --   BILITOT 0.6 0.4  --   --   --   PROT 6.2* 6.7  --   --   --   ALBUMIN 2.0* 2.2* 2.1* 2.5* 3.2*    CBG: Recent Labs  Lab 03/21/23 0751 03/21/23 1153 03/21/23 1621 03/21/23 2250 03/22/23 0744  GLUCAP 195* 175* 280* 281* 145*     Recent Results (from the past 240 hour(s))  Resp panel by RT-PCR (RSV, Flu A&B, Covid) Anterior Nasal Swab     Status: None   Collection Time: 03/14/23  1:51 PM   Specimen: Anterior Nasal Swab  Result Value Ref Range Status   SARS Coronavirus 2 by RT PCR NEGATIVE NEGATIVE Final    Comment: (NOTE) SARS-CoV-2 target nucleic acids are NOT DETECTED.  The SARS-CoV-2 RNA is  generally detectable in upper respiratory specimens during the acute phase of infection. The lowest concentration of SARS-CoV-2 viral copies this assay can detect is 138 copies/mL. A negative result does not preclude SARS-Cov-2 infection and should not be used as the sole basis for treatment or other patient management decisions. A negative result may occur with  improper specimen collection/handling, submission of specimen other than nasopharyngeal swab, presence of viral mutation(s) within the areas targeted by this assay, and inadequate number of viral copies(<138 copies/mL). A negative result must be combined with clinical observations, patient history, and epidemiological information. The  expected result is Negative.  Fact Sheet for Patients:  BloggerCourse.com  Fact Sheet for Healthcare Providers:  SeriousBroker.it  This test is no t yet approved or cleared by the [place] FDA and  has been authorized for detection and/or diagnosis of SARS-CoV-2 by FDA under an Emergency Use Authorization (EUA). This EUA will remain  in effect (meaning this test can be used) for the duration of the COVID-19 declaration under Section 564(b)(1) of the Act, 21 U.S.C.section 360bbb-3(b)(1), unless the authorization is terminated  or revoked sooner.       Influenza A by PCR NEGATIVE NEGATIVE Final   Influenza B by PCR NEGATIVE NEGATIVE Final    Comment: (NOTE) The Xpert Xpress SARS-CoV-2/FLU/RSV plus assay is intended as an aid in the diagnosis of influenza from Nasopharyngeal swab specimens and should not be used as a sole basis for treatment. Nasal washings and aspirates are unacceptable for Xpert Xpress SARS-CoV-2/FLU/RSV testing.  Fact Sheet for Patients: BloggerCourse.com  Fact Sheet for Healthcare Providers: SeriousBroker.it  This test is not yet approved or cleared by the [place] FDA and has been authorized for detection and/or diagnosis of SARS-CoV-2 by FDA under an Emergency Use Authorization (EUA). This EUA will remain in effect (meaning this test can be used) for the duration of the COVID-19 declaration under Section 564(b)(1) of the Act, 21 U.S.C. section 360bbb-3(b)(1), unless the authorization is terminated or revoked.     Resp Syncytial Virus by PCR NEGATIVE NEGATIVE Final    Comment: (NOTE) Fact Sheet for Patients: BloggerCourse.com  Fact Sheet for Healthcare Providers: SeriousBroker.it  This test is not yet approved or cleared by the [place] FDA and has been authorized for detection and/or  diagnosis of SARS-CoV-2 by FDA under an Emergency Use Authorization (EUA). This EUA will remain in effect (meaning this test can be used) for the duration of the COVID-19 declaration under Section 564(b)(1) of the Act, 21 U.S.C. section 360bbb-3(b)(1), unless the authorization is terminated or revoked.  Performed at [facility], [address] [place], [place] [address]   Culture, blood (routine x 2)     Status: None   Collection Time: 03/14/23  1:51 PM   Specimen: BLOOD  Result Value Ref Range Status   Specimen Description   Final    BLOOD RIGHT ANTECUBITAL Performed at [facility], [address], [place], [place] [address]    Special Requests   Final    BOTTLES DRAWN AEROBIC AND ANAEROBIC Blood Culture adequate volume Performed at [facility], [address] [place], [place] [address]    Culture   Final    NO GROWTH 5 DAYS Performed at [facility], [address], [place], [place] [address]    Report Status 03/19/2023 FINAL  Final  Culture, blood (routine x 2)     Status: Abnormal   Collection Time: 03/14/23  2:57 PM  Specimen: BLOOD RIGHT ARM  Result Value Ref Range Status   Specimen Description   Final    BLOOD RIGHT ARM Performed at [facility], [address], [place], [place] [address]    Special Requests   Final    BOTTLES DRAWN AEROBIC AND ANAEROBIC Blood Culture adequate volume Performed at [facility], [address] [place], [place] [address]    Culture  Setup Time   Final    GRAM POSITIVE COCCI IN CLUSTERS AEROBIC BOTTLE ONLY CRITICAL RESULT CALLED TO, READ BACK BY AND VERIFIED WITH: PHARMD LEANN POINDEXTER ON 03/18/23 @ 2233 BY DRT    Culture (A)  Final    STAPHYLOCOCCUS EPIDERMIDIS THE SIGNIFICANCE OF ISOLATING THIS ORGANISM FROM A SINGLE SET OF BLOOD CULTURES WHEN MULTIPLE SETS ARE DRAWN IS UNCERTAIN. PLEASE NOTIFY THE MICROBIOLOGY DEPARTMENT WITHIN ONE WEEK  IF SPECIATION AND SENSITIVITIES ARE REQUIRED. Performed at [facility], [address], [place], [place] [address]    Report Status 03/19/2023 FINAL  Final  Blood Culture ID Panel (Reflexed)     Status: Abnormal   Collection Time: 03/14/23  2:57 PM  Result Value Ref Range Status   Enterococcus faecalis NOT DETECTED NOT DETECTED Final   Enterococcus Faecium NOT DETECTED NOT DETECTED Final   Listeria monocytogenes NOT DETECTED NOT DETECTED Final   Staphylococcus species DETECTED (A) NOT DETECTED Final    Comment: CRITICAL RESULT CALLED TO, READ BACK BY AND VERIFIED WITH: PHARMD LEANN POINDEXTER ON 03/18/23 @ 2233 BY DRT    Staphylococcus aureus (BCID) NOT DETECTED NOT DETECTED Final   Staphylococcus epidermidis DETECTED (A) NOT DETECTED Final    Comment: Methicillin (oxacillin) resistant coagulase negative staphylococcus. Possible blood culture contaminant (unless isolated from more than one blood culture draw or clinical case suggests pathogenicity). No antibiotic treatment is indicated for blood  culture contaminants. CRITICAL RESULT CALLED TO, READ BACK BY AND VERIFIED WITH: PHARMD LEANN POINDEXTER ON 03/18/23 @ 2233 BY DRT    Staphylococcus lugdunensis NOT DETECTED NOT DETECTED Final   Streptococcus species NOT DETECTED NOT DETECTED Final   Streptococcus agalactiae NOT DETECTED NOT DETECTED Final   Streptococcus pneumoniae NOT DETECTED NOT DETECTED Final   Streptococcus pyogenes NOT DETECTED NOT DETECTED Final   A.calcoaceticus-baumannii NOT DETECTED NOT DETECTED Final   Bacteroides fragilis NOT DETECTED NOT DETECTED Final   Enterobacterales NOT DETECTED NOT DETECTED Final   Enterobacter cloacae complex NOT DETECTED NOT DETECTED Final   Escherichia coli NOT DETECTED NOT DETECTED Final   Klebsiella aerogenes NOT DETECTED NOT DETECTED Final   Klebsiella oxytoca NOT DETECTED NOT DETECTED Final   Klebsiella pneumoniae NOT DETECTED NOT DETECTED Final   Proteus species NOT  DETECTED NOT DETECTED Final   Salmonella species NOT DETECTED NOT DETECTED Final   Serratia marcescens NOT DETECTED NOT DETECTED Final   Haemophilus influenzae NOT DETECTED NOT DETECTED Final   Neisseria meningitidis NOT DETECTED NOT DETECTED Final   Pseudomonas aeruginosa NOT DETECTED NOT DETECTED Final   Stenotrophomonas maltophilia NOT DETECTED NOT DETECTED Final   Candida albicans NOT DETECTED NOT DETECTED Final   Candida auris NOT DETECTED NOT DETECTED Final   Candida glabrata NOT DETECTED NOT DETECTED Final   Candida krusei NOT DETECTED NOT DETECTED Final   Candida parapsilosis NOT DETECTED NOT DETECTED Final   Candida tropicalis NOT DETECTED NOT DETECTED Final   Cryptococcus neoformans/gattii NOT DETECTED NOT DETECTED Final   Methicillin resistance mecA/C DETECTED (A) NOT DETECTED Final    Comment: CRITICAL RESULT  CALLED TO, READ BACK BY AND VERIFIED WITH: PHARMD LEANN POINDEXTER ON 03/18/23 @ 2233 BY DRT Performed at [facility], [address], [place], [place] [address]   MRSA Next Gen by PCR, Nasal     Status: None   Collection Time: 03/14/23 10:15 PM   Specimen: Nasal Mucosa; Nasal Swab  Result Value Ref Range Status   MRSA by PCR Next Gen NOT DETECTED NOT DETECTED Final    Comment: (NOTE) The GeneXpert MRSA Assay (FDA approved for NASAL specimens only), is one component of a comprehensive MRSA colonization surveillance program. It is not intended to diagnose MRSA infection nor to guide or monitor treatment for MRSA infections. Test performance is not FDA approved in patients less than 2 years old. Performed at [facility], [address], [place], [place] [address]   Urine Culture (for pregnant, neutropenic or urologic patients or patients with an indwelling urinary catheter)     Status: Abnormal   Collection Time: 03/17/23  3:18 PM   Specimen: Urine, Catheterized  Result Value Ref Range Status   Specimen Description   Final    URINE,  CATHETERIZED Performed at [facility], [address], [place], [place] [address]    Special Requests   Final    NONE Performed at [facility], [address], [place], [place] [address]    Culture 30,000 COLONIES/mL YEAST (A)  Final   Report Status 03/18/2023 FINAL  Final         Radiology Studies: No results found.      Scheduled Meds:  amLODipine  5 mg Oral Daily   apixaban  10 mg Oral BID   Followed by   Melene Muller ON 03/28/2023] apixaban  5 mg Oral BID   budesonide (PULMICORT) nebulizer solution  0.5 mg Nebulization BID   Chlorhexidine Gluconate Cloth  6 each Topical Daily   cyanocobalamin  1,000 mcg Subcutaneous Q1200   donepezil  10 mg Oral QHS   feeding supplement (GLUCERNA SHAKE)  237 mL Oral BID BM   fluticasone  2 spray Each Nare Daily   Gerhardt's butt cream   Topical BID   insulin aspart  0-9 Units Subcutaneous TID WC   insulin glargine-yfgn  10 Units Subcutaneous Daily   ipratropium-albuterol  3 mL Nebulization QID   loratadine  10 mg Oral Daily   nystatin  5 mL Oral QID   mouth rinse  15 mL Mouth Rinse 4 times per day   pantoprazole  40 mg Oral Daily   [START ON 03/23/2023] predniSONE  20 mg Oral QAC breakfast   predniSONE  40 mg Oral Q breakfast   sodium zirconium cyclosilicate  10 g Oral Once   sodium zirconium cyclosilicate  5 g Oral Once   Continuous Infusions:  cefTRIAXone (ROCEPHIN)  IV Stopped (03/21/23 2227)   metronidazole Stopped (03/22/23 0320)     LOS: 8 days    Time spent: 40 minutes    Ramiro Harvest, MD [facility]   To contact the attending provider between 7A-7P or the covering provider during after hours 7P-7A, please log into the web site [email or web address] and access using universal [place] password for that web site. If you do not have the password, please call the hospital operator.  03/22/2023, 9:53 AM

## 2023-03-22 NOTE — Plan of Care (Signed)
  Problem: Clinical Measurements: Goal: Respiratory complications will improve Outcome: Progressing Goal: Cardiovascular complication will be avoided Outcome: Progressing   Problem: Nutrition: Goal: Adequate nutrition will be maintained Outcome: Progressing   Problem: Pain Managment: Goal: General experience of comfort will improve Outcome: Progressing   Problem: Safety: Goal: Ability to remain free from injury will improve Outcome: Progressing   Problem: Respiratory: Goal: Ability to maintain adequate ventilation will improve Outcome: Progressing   Problem: Education: Goal: Knowledge of General Education information will improve Description: Including pain rating scale, medication(s)/side effects and non-pharmacologic comfort measures Outcome: Not Progressing

## 2023-03-22 NOTE — TOC Progression Note (Signed)
Transition of Care [facility]) - Progression Note    Patient Details  Name: Kathy Thomas MRN: [id number] Date of Birth: 24-Apr-1935  [facility] CM/SW Contact  Darleene Cleaver, [place] Phone Number: 03/22/2023, 5:28 PM  Clinical Narrative:     CSW spoke to [facility] at [facility] [employer] [facility], she said patient is a LTC Medicaid patient from their facility.  CSW updated French Ana, that patient is not medically ready for discharge yet.   Expected Discharge Plan: Skilled Nursing Facility Barriers to Discharge: No Barriers Identified  Expected Discharge Plan and Services In-house Referral: NA Discharge Planning Services: NA Post Acute Care Choice: Skilled Nursing Facility Living arrangements for the past 2 months: Skilled Nursing Facility                 DME Arranged: Bipap DME Agency: [employer] Date DME Agency Contacted: 03/19/23 Time DME Agency Contacted: 1213 Representative spoke with at DME Agency: Vaughan Basta HH Arranged:  (NA)           Social Determinants of Health (SDOH) Interventions SDOH Screenings   Food Insecurity: No Food Insecurity (03/14/2023)  Housing: Low Risk  (03/14/2023)  Transportation Needs: No Transportation Needs (03/14/2023)  Utilities: Not At Risk (03/14/2023)  Depression (PHQ2-9): Low Risk  (08/14/2020)  Tobacco Use: Medium Risk (03/14/2023)    Readmission Risk Interventions     No data to display

## 2023-03-23 DIAGNOSIS — I2699 Other pulmonary embolism without acute cor pulmonale: Secondary | ICD-10-CM | POA: Diagnosis not present

## 2023-03-23 DIAGNOSIS — E875 Hyperkalemia: Secondary | ICD-10-CM | POA: Diagnosis not present

## 2023-03-23 DIAGNOSIS — J984 Other disorders of lung: Secondary | ICD-10-CM | POA: Diagnosis not present

## 2023-03-23 DIAGNOSIS — F0390 Unspecified dementia without behavioral disturbance: Secondary | ICD-10-CM | POA: Diagnosis not present

## 2023-03-23 DIAGNOSIS — J189 Pneumonia, unspecified organism: Secondary | ICD-10-CM | POA: Diagnosis not present

## 2023-03-23 LAB — CBC
HCT: 40.7 % (ref 36.0–46.0)
Hemoglobin: 11.3 g/dL — ABNORMAL LOW (ref 12.0–15.0)
MCH: 25.6 pg — ABNORMAL LOW (ref 26.0–34.0)
MCHC: 27.8 g/dL — ABNORMAL LOW (ref 30.0–36.0)
MCV: 92.1 fL (ref 80.0–100.0)
Platelets: 184 10*3/uL (ref 150–400)
RBC: 4.42 MIL/uL (ref 3.87–5.11)
RDW: 16.8 % — ABNORMAL HIGH (ref 11.5–15.5)
WBC: 8 10*3/uL (ref 4.0–10.5)
nRBC: 0 % (ref 0.0–0.2)

## 2023-03-23 LAB — GLUCOSE, CAPILLARY
Glucose-Capillary: 169 mg/dL — ABNORMAL HIGH (ref 70–99)
Glucose-Capillary: 139 mg/dL — ABNORMAL HIGH (ref 70–99)
Glucose-Capillary: 240 mg/dL — ABNORMAL HIGH (ref 70–99)
Glucose-Capillary: 300 mg/dL — ABNORMAL HIGH (ref 70–99)

## 2023-03-23 LAB — RENAL FUNCTION PANEL
Albumin: 2.8 g/dL — ABNORMAL LOW (ref 3.5–5.0)
Anion gap: 9 (ref 5–15)
BUN: 43 mg/dL — ABNORMAL HIGH (ref 8–23)
CO2: 28 mmol/L (ref 22–32)
Calcium: 8.9 mg/dL (ref 8.9–10.3)
Chloride: 104 mmol/L (ref 98–111)
Creatinine, Ser: 0.62 mg/dL (ref 0.44–1.00)
GFR, Estimated: >60 mL/min (ref >60)
Glucose, Bld: 236 mg/dL — ABNORMAL HIGH (ref 70–99)
Phosphorus: 3.2 mg/dL (ref 2.5–4.6)
Potassium: 5.4 mmol/L — ABNORMAL HIGH (ref 3.5–5.1)
Sodium: 141 mmol/L (ref 135–145)

## 2023-03-23 MED ORDER — SODIUM ZIRCONIUM CYCLOSILICATE 10 G PO PACK
10.0000 g | PACK | Freq: Three times a day (TID) | ORAL | Status: DC
  Administered 2023-03-23 (×3): 10 g via ORAL
  Filled 2023-03-23 (×4): qty 1

## 2023-03-23 MED ORDER — FUROSEMIDE 10 MG/ML IJ SOLN
40.0000 mg | Freq: Once | INTRAMUSCULAR | Status: AC
  Administered 2023-03-23: 40 mg via INTRAVENOUS
  Filled 2023-03-23: qty 4

## 2023-03-23 MED ORDER — INSULIN ASPART 100 UNIT/ML IJ SOLN
3.0000 [IU] | Freq: Once | INTRAMUSCULAR | Status: AC
  Administered 2023-03-23: 3 [IU] via SUBCUTANEOUS

## 2023-03-23 NOTE — Progress Notes (Signed)
Removed PT from BiPAP at 0755 (RN aware)- placed on 2 LPM while giving nebulizer on medical air. PT Sp02 remained 97%- placed PT on RA (RN aware) remains 97%. Supplemental 02 remains at bedside if needed.

## 2023-03-23 NOTE — Progress Notes (Signed)
Speech Language Pathology Treatment: Dysphagia  Patient Details Name: Kathy Thomas MRN: [id number] DOB: [birth year] Today's Date: 03/23/2023 Time: 4696-2952 SLP Time Calculation (min) (ACUTE ONLY): 20 min  Assessment / Plan / Recommendation Clinical Impression  Patient seen by SLP for skilled intervention focused on dysphagia management. Patient was awake when SLP entered the room with her daughter at bedside. Daughter provided PMH. Per daughter's reports, she has had to cut foods into smaller pieces as patient's mastication is otherwise significantly prolonged. Patient's daughter endorsed occasional pocketing of solid foods. SLP directly observed patient with sips of thin liquid via straw. No overt s/sx of aspiration noted. SLP and patient's daughter discussed solid food consistency and came to consensus that patient would better tolerate dys 2 (minced) diet with extra gravy/sauces. ST will continue to follow patient for diet toleration monitoring. Recommend continued ST at next venue of care for continued dysphagia management intervention.   HPI HPI: Patient is a 88 year old female admitted to [facility] with altered mental status, hypoxia, diagnosed with pneumonia and placed on treatment.  Past medical history positive for COVID-19 a few months ago, dementia, CHF, gait abnormalities.  CT chest showed pulmonary infarct as well as groundglass opacities in the right middle lobe and a hiatal hernia.  Swallow evaluation ordered.  Asher Kathy Thomas, daughter reports patient has been able to feed herself with her right and has not had dysphagia prior to recent illness.      SLP Plan  Continue with current plan of care      Recommendations for follow up therapy are one component of a multi-disciplinary discharge planning process, led by the attending physician.  Recommendations may be updated based on patient status, additional functional criteria and insurance authorization.    Recommendations   Diet recommendations: Dysphagia 2 (fine chop);Thin liquid Liquids provided via: Cup;Straw Medication Administration: Whole meds with puree Supervision: Staff to assist with self feeding;Trained caregiver to feed patient Compensations: Slow rate;Small sips/bites;Minimize environmental distractions Postural Changes and/or Swallow Maneuvers: Seated upright 90 degrees                  Oral care BID   Frequent or constant Supervision/Assistance Dysphagia, oral phase (R13.11)     Continue with current plan of care     Marline Backbone, B.S., Speech Therapy Student    03/23/2023, 4:21 PM

## 2023-03-23 NOTE — Progress Notes (Addendum)
Pharmacy Note- Penicillin Allergy Clarification   ASSESSMENT:   PEN-FAST Scoring   Five years or less since last reaction Unknown  Anaphylaxis/Angioedema OR Severe cutaneous adverse reaction  Unknown  Treatment required for reaction  Unknown  Total Score -    Spoke with patient's daughter to gather penicillin allergy history. Patient's daughter was unsure of severity reaction or timeline of reaction. Reports that she only knows she couldn't take penicillin. No documentation of taking penicillin in chart.   Roslyn Smiling, PharmD PGY1 Pharmacy Resident 03/23/2023 2:34 PM

## 2023-03-23 NOTE — Progress Notes (Signed)
NAME:  Kathy Thomas, MRN:  [id number], DOB:  1934/12/04, LOS: 9 ADMISSION DATE:  03/14/2023, CONSULTATION DATE:  10/22 REFERRING MD:  Dr. Rhona Leavens, CHIEF COMPLAINT: Cavitary pneumonia   History of Present Illness:  76F with PMH as below, which is significant for DM, HTN, dementia, and stroke. She resides in SNF where she was started on ertapenem for pneumonia after developing hypoxia and shortness of breath.  She did not have immediate improvement.  On 10/20 in the a.m. hours she was noted to be unresponsive with oxygen saturations in the 60s.  This was ultimately felt to be secondary to hypoglycemia and her responsiveness did somewhat improve with treatment.  She was brought to the emergency department for ongoing evaluation.  Workup for hypoxia included a CT scan of the chest which demonstrated cavitary lesion in the right middle lobe.  There was concern for PE versus atypical pneumonia.  VQ scan was done and was concerning for pulmonary embolism.  She was initiated on treatment with empiric antibiotics as well as systemic anticoagulation.  PCCM was consulted for cavitary pneumonia.  Pertinent  Medical History   has a past medical history of Acute sinusitis, unspecified (05/28/2008), ANEMIA-IRON DEFICIENCY (10/26/2008), ANXIETY (10/06/2007), CHOLELITHIASIS (10/06/2007), COLONIC POLYPS, HX OF (01/26/2007), DIABETES MELLITUS, TYPE II (10/06/2007), DIVERTICULOSIS, COLON (01/26/2007), ECCHYMOSES, SPONTANEOUS (06/04/2010), GERD (01/26/2007), Heart murmur, HERNIATED DISC (01/26/2007), HYPERLIPIDEMIA (10/06/2007), HYPERTENSION (01/26/2007), LOW BACK PAIN (01/29/2007), MENOPAUSAL DISORDER (10/26/2008), OSA (obstructive sleep apnea) (03/22/2023), OSTEOPOROSIS (06/04/2010), SHOULDER PAIN, LEFT (10/06/2007), and Stroke (HCC).   Significant Hospital Events: Including procedures, antibiotic start and stop dates in addition to other pertinent events   10/20 admit for pneumonia 10/21 VQ + for PE 10/22 PCCM consult for cavitary  pneumonia 10/23 tx to SDU for BiPAP in the setting of lethargy and hypercarbia 10/24 Tolerated BiPAP most of the night. More wakeful this morning 10/25 no acute events overnight, utilize BiPAP  Interim History / Subjective:  Wore BiPAP overnight Remains on 2L Unable to be weaned to room air (sats dropped to 70s) Coughs with deep breathing No complaints  Objective   Blood pressure (!) 149/61, pulse (!) 47, temperature 98.7 F (37.1 C), temperature source Axillary, resp. rate 19, height 5\' 1"  (1.549 m), weight 119.8 kg, SpO2 97%.    FiO2 (%):  [40 %-50 %] 40 %   Intake/Output Summary (Last 24 hours) at 03/23/2023 0831 Last data filed at 03/22/2023 1733 Gross per 24 hour  Intake 100 ml  Output --  Net 100 ml   Filed Weights   03/14/23 1803 03/17/23 1800  Weight: 116.6 kg 119.8 kg    Examination:  General:  obese elderly female resting comfortably in bed Neuro:  awake, alert, oriented to self.  HEENT: [place]/T, PERRL, unable to appreciate JVD Cardiovascular:  brady, regular, no MRG Lungs:  Clear Abdomen:  Soft, NT ,ND Musculoskeletal:  No acute deformity or ROM limitation Skin:  grossly intact.    Resolved Hospital Problem list     Assessment & Plan:   Cavitary pneumonia likely secondary to aspiration less likely due to pulmonary infarct, but she does has PE.  - Continue prolonged course of antibiotic therapy with treatment of anaerobes - Dysphagia diet per SLP recs - Will need repeat imaging after antibiotic therapy is complete.  - Aspiration precautions - 2 days left in predniosne taper, but reasonable to just stop if sugars are too reactive.  Acute on chronic respiratory failure with hypoxia and hypercarbia secondary to what is likely undiagnosed OSA/OHS. Staff  has observed apneic periods while sleeping. She has been doing well with at bedtime BiPAP. - Continue at bedtime bedtime.  - Will trial a dose of lasix - OK to continue budesonide, duonebs for now - She will  qualify for outpatient NIV by her chronic bicarb elevation - Will need to determine if this can be safely used at her SNF ([facility]) as she is known to pocket food and has dysphagia.   Pulmonary embolism DVT left popliteal - AC per primary (Eliquis) - Given her sedentary lifestyle is unlikely to change, she may just need lifelong AC at this point.   Goals of care: patient has DNR documents somewhere, but family has been unable to produce them. They wish for her to remain full code for now.   Per primary: AKI HTN Dementia Hypoglycemia   Best Practice (right click and "Reselect all SmartList Selections" daily)   Per primary  Signature:      Joneen Roach, AGACNP-BC [facility]  See Amion for personal pager PCCM on call pager [phone] until 7pm. Please call Elink 7p-7a. [phone]  03/23/2023 8:31 AM

## 2023-03-23 NOTE — Progress Notes (Signed)
PROGRESS NOTE    Kathy Thomas  NFA:[id number] DOB: December 03, 1934 DOA: 03/14/2023 PCP: Marden Noble, MD (Inactive)   Chief Complaint  Patient presents with   Hypoglycemia   Shortness of Breath    Brief Narrative:  88 y.o. female with medical history significant of dementia, hypertension who presented to the emergency department due to shortness of breath.  Patient was found to be hypoxic 3 days ago and underwent chest x-ray which showed concern for community-acquired pneumonia.  She was started on ertapenem and received 3 doses.  By the nursing facility she developed tachypnea and shortness of breath so EMS was called to bring her to the emergency department further assessment.  On EMS arrival she was found to be hypoglycemic.  She was brought to the ER where she was hypoxic requiring up to 6 L nasal cannula. Pt was admitted for concerns of cavitary PNA    Assessment & Plan:   Principal Problem:   Cavitary pneumonia Active Problems:   Pulmonary embolism (HCC)   DVT (deep venous thrombosis) (HCC)   DM type 2 (diabetes mellitus, type 2) (HCC)   HTN (hypertension)   Dementia without behavioral disturbance (HCC)   Diabetes mellitus without complication (HCC)   Pressure injury of skin   Hyperkalemia   AKI (acute kidney injury) (HCC)   Oral thrush   Hypernatremia   Dysphagia   OSA (obstructive sleep apnea)   #1 cavitary pneumonia -Patient noted to have presented with altered mental status, tachypnea, worsening shortness of breath and noted to have been hypoxic 3 days prior to admission underwent chest x-ray concerning for community-acquired pneumonia. -On arrival to the ED patient noted to be hypoxic requiring up to 6 L nasal cannula and admitted for concerns for cavitary pneumonia. -CT chest done with findings notable for a large masslike consolidation of the RML with central groundglass, cavitation, peripheral ring of dense consolidation concerning for pulmonary infarct versus  atypical cavitary pneumonia.  Small right pleural effusion noted. -Patient noted to have completed ertapenem prior to admission. -Patient drowsy/lethargic the evening of 03/16/2023 and in the morning of 03/17/2023. -Patient was placed on BiPAP 03/17/2023 with clinical improvement currently on 4 L nasal cannula with sats of 96-98%. -Status post IV albumin and IV Lasix 03/18/2023 with good urine output. -Patient seen in consultation by pulmonary who assessed the patient and recommending continuation of antibiotics of IV Rocephin and Flagyl and recommended total of 4 to 6 weeks of antibiotics await reimaging in the outpatient setting. -Patient assessed by SLP. -Supportive care.  2.  PE/left popliteal DVT -Noted on V/Q scan with findings confirming PE. -Lower extremity Dopplers with left lower extremity DVT. -2D echo with EF of 55 to 60%, mild concentric LVH, grade 1 DD, full study not completed as patient noted to have refused exam. -Was on IV heparin was transitioned to full dose Lovenox and subsequently transition to Eliquis on 03/21/2023. -Per pulmonary likely need lifelong DOAC as patient at baseline with a sedentary lifestyle..  3.  Acute metabolic encephalopathy/acute respiratory failure with hypercarbia/probable undiagnosed OSA/OHS -Likely multifactorial secondary to hypercarbia in the setting of acute illness of cavitary pneumonia, with prior history of tobacco use. -Patient with no focal neurological deficits however daughter was concerned due to patient's family history and current mental status concern for possible CVA. -CT head done on admission negative for any acute abnormalities. -Repeat head CT negative for any acute abnormalities.. -Ammonia levels slightly elevated at 41 and trended down with lactulose. -ABG with a pH  of 7.3/pCO2 of 66/pO2 of 73/bicarb of 32. -Patient with clinical improvement after being placed on BiPAP yesterday with repeat ABG with a pH of 7.29/pCO2 of 64/pO2  of 66 with a bicarb of 31. -Lactulose discontinued.   -Repeat ammonia level at 32..   -BiPAP nightly and as needed. -Continue treatment as in problem #1 and 2. -Continue Pulmicort, Flonase, scheduled DuoNebs, Claritin. -IV Solu-Medrol has been transitioned to oral prednisone taper.  Continue PPI.  -BiPAP as needed and nightly. -Patient likely with probable undiagnosed OSA/OHS. -Lasix ordered per PCCM for trial. -Per PCCM patient will qualify for outpatient NIV by her chronic bicarb elevation. -Will need to determine if this can safely be used at her SNF as per family patient is known to pocket food and has dysphagia. -Pulmonary following and feel patient would likely need  NIV at discharge.  4.  Hyperkalemia -Improved initially with Lokelma however potassium back up to 5.4 this morning.   -Lokelma 3 times daily.   -K-Phos discontinued.   -Patient to receive IV Lasix today. -Repeat labs in the AM.   5.  AKI -Likely secondary to prerenal azotemia in the setting of ARB, diuretics.  Patient also noted to be on Jardiance prior to admission. -Urine output not properly recorded over the past 24 hours. -Patient noted to have received IV albumin and Lasix 40 mg IV x 1 on 03/18/2023.. -Renal function improved.   -Saline lock IV fluids.  -Continue to hold ARB and diuretics.  6.  Oral thrush -Clinical improvement.   -Nystatin swish and swallow.   7.  Hypertension -BP has improved.   -ARB, diuretics on hold. -IV hydralazine as needed. -Patient with some bradycardia early on and during the hospitalization IV Lopressor was initially held.  -Patient with some bradycardia while sleeping but improved with arousal. -IV Lopressor discontinued patient started on Norvasc 5 mg daily for better blood pressure control.  -If ongoing bradycardia while is awake may need to consider discontinuation of Norvasc which should not have affected heart rate.  8.  Cognitive impairment -Aricept.  9.   Hypoglycemia/diabetes mellitus type 2 -Patient noted to have received insulin prior to admission at facility. -Hemoglobin A1c 7.1 (03/15/2023) -CBG noted at 169 this morning. -Continue to hold oral hypoglycemic agents of Amaryl and Jardiance. -Patient started on steroids and as such monitor CBGs. -Steroids being tapered. -IV fluids have been saline locked. -Continue Semglee 10 units daily, SSI.   10.  Bilateral upper extremity swelling -Likely secondary to third spacing patient with hypoalbuminemia. -Status post IV albumin, Lasix 40 mg IV x 1 with significant improvement with upper extremity swelling.   -Patient does look volume up and Lasix 40 mg IV x 1 ordered.  PCCM.  11.  Hypernatremia -Resolved with D5W and half-normal saline.   -Follow.  12.  Dysphagia -Patient initially was on dysphagia 1 diet, difficulty swallowing initially on presentation was likely due to patient's lethargy. -Patient more alert, reassessed by SLP and diet advanced to a dysphagia 3 diet. -Daughter does state patient occasionally pockets food. -SLP following and appreciate input and recommendations.  13.  Sinus bradycardia -Patient noted bradycardic in the mid to high 40s to 50s while on BiPAP and sleeping. -Heart rate improves with awakening/arousal. -Check a EKG. -Follow.   14.  Pressure injury buttock stage II, POA Pressure Injury 03/14/23 Buttocks Stage 2 -  Partial thickness loss of dermis presenting as a shallow open injury with a red, pink wound bed without slough. (Active)  03/14/23 1830  Location: Buttocks  Location Orientation:   Staging: Stage 2 -  Partial thickness loss of dermis presenting as a shallow open injury with a red, pink wound bed without slough.  Wound Description (Comments):   Present on Admission: Yes       DVT prophylaxis: Eliquis Code Status: Full Family Communication: Updated daughter at bedside.  Disposition: Remain in SDU.  Likely back to SNF when medically  stable.    Status is: Inpatient Remains inpatient appropriate because: Severity of illness   Consultants:  PCCM: Dr. Isaiah Serge 03/16/2023  Procedures:  CT head 03/14/2023, 03/17/2023 CT chest 03/14/2023 Left upper extremity Dopplers 03/16/2023 Lower extremity Dopplers 03/16/2023 2D echo 03/15/2023  Antimicrobials:  Anti-infectives (From admission, onward)    Start     Dose/Rate Route Frequency Ordered Stop   03/16/23 2200  cefTRIAXone (ROCEPHIN) 2 g in sodium chloride 0.9 % 100 mL IVPB        2 g 200 mL/hr over 30 Minutes Intravenous Every 24 hours 03/16/23 1322     03/16/23 1400  metroNIDAZOLE (FLAGYL) IVPB 500 mg        500 mg 100 mL/hr over 60 Minutes Intravenous Every 12 hours 03/16/23 1322     03/16/23 0915  ceFEPIme (MAXIPIME) 2 g in sodium chloride 0.9 % 100 mL IVPB  Status:  Discontinued        2 g 200 mL/hr over 30 Minutes Intravenous Every 12 hours 03/16/23 0904 03/16/23 1322   03/15/23 1800  ceFEPIme (MAXIPIME) 2 g in sodium chloride 0.9 % 100 mL IVPB  Status:  Discontinued        2 g 200 mL/hr over 30 Minutes Intravenous Every 24 hours 03/14/23 2010 03/16/23 0904   03/14/23 1954  vancomycin variable dose per unstable renal function (pharmacist dosing)  Status:  Discontinued         Does not apply See admin instructions 03/14/23 1954 03/15/23 0744   03/14/23 1930  vancomycin (VANCOCIN) IVPB 1000 mg/200 mL premix        1,000 mg 200 mL/hr over 60 Minutes Intravenous  Once 03/14/23 1842 03/15/23 0700   03/14/23 1700  vancomycin (VANCOCIN) IVPB 1000 mg/200 mL premix        1,000 mg 200 mL/hr over 60 Minutes Intravenous  Once 03/14/23 1652 03/15/23 0700   03/14/23 1700  ceFEPIme (MAXIPIME) 2 g in sodium chloride 0.9 % 100 mL IVPB        2 g 200 mL/hr over 30 Minutes Intravenous  Once 03/14/23 1652 03/15/23 0700         Subjective: Patient sitting up in bed getting fed by her daughter.  Patient pleasantly confused.  Denies any worsening shortness of breath or chest  pain.  No abdominal pain.  Tolerated BiPAP overnight.  Per daughter patient occasionally pockets food with her associated dysphagia.  Objective: Vitals:   03/23/23 0400 03/23/23 0600 03/23/23 0755 03/23/23 0759  BP:  (!) 149/61    Pulse: (!) 55 (!) 47    Resp: 15 19    Temp: 98.7 F (37.1 C)   (!) 97.5 F (36.4 C)  TempSrc: Axillary   Axillary  SpO2: 97% 97% 97% 97%  Weight:      Height:        Intake/Output Summary (Last 24 hours) at 03/23/2023 1011 Last data filed at 03/23/2023 0911 Gross per 24 hour  Intake 293.27 ml  Output --  Net 293.27 ml   Filed Weights   03/14/23 1803 03/17/23 1800  Weight: 116.6 kg 119.8 kg    Examination:  General exam: Awake alert.  Oral thrush improved.  Respiratory system: Scattered crackles.  Some scattered coarse breath sounds.  No significant wheezing.  Fair air movement.  Cardiovascular system: Bradycardia.  No JVD.  No murmurs rubs or gallops.  Trace to 1+ bilateral lower extremity edema.  Gastrointestinal system: Abdomen is obese, soft, nontender, nondistended, positive bowel sounds.  No rebound.  No guarding. Central nervous system: Alert and oriented.  Moving extremities spontaneously.  No focal neurological deficits. Extremities: Bilateral upper extremities with significantly improved edema.  No significant pitting lower extremity edema. Skin: Ecchymosis on left hand and right hand.  Psychiatry: Judgement and insight poor to fair.  Mood and affect appropriate.     Data Reviewed: I have personally reviewed following labs and imaging studies  CBC: Recent Labs  Lab 03/19/23 0318 03/20/23 0307 03/21/23 0303 03/22/23 0311 03/23/23 0313  WBC 9.5 8.3 8.0 7.4 8.0  NEUTROABS  --   --   --  6.1  --   HGB 11.8* 11.3* 12.4 11.2* 11.3*  HCT 42.9 42.1 46.3* 42.4 40.7  MCV 93.5 94.4 93.7 95.9 92.1  PLT 229 232 231 189 184    Basic Metabolic Panel: Recent Labs  Lab 03/17/23 0449 03/18/23 0335 03/18/23 0347 03/18/23 0845  03/19/23 0318 03/20/23 0307 03/21/23 0303 03/21/23 1148 03/22/23 0311 03/23/23 0313  NA 138   < > 140  --  145 153* 146*  --  141 141  K 5.3*   < > 5.5*   < > 4.7 4.7 5.4* 4.7 5.3* 5.4*  CL 104   < > 106  --  110 117* 111  --  106 104  CO2 26   < > 27  --  28 30 28   --  29 28  GLUCOSE 114*   < > 280*  --  281* 193* 261*  --  239* 236*  BUN 37*   < > 32*  --  39* 44* 45*  --  43* 43*  CREATININE 0.99   < > 0.96  --  0.95 0.82 0.87  --  0.77 0.62  CALCIUM 8.1*   < > 8.2*  --  8.1* 8.4* 8.3*  --  8.7* 8.9  MG 2.7*  --   --   --  2.5*  --   --   --   --   --   PHOS  --   --  3.5  --   --  2.1* 3.0  --  3.3 3.2   < > = values in this interval not displayed.    GFR: Estimated Creatinine Clearance: 62.2 mL/min (by C-G formula based on SCr of 0.62 mg/dL).  Liver Function Tests: Recent Labs  Lab 03/17/23 0449 03/18/23 0347 03/20/23 0307 03/22/23 0311 03/23/23 0313  AST 12*  --   --   --   --   ALT 13  --   --   --   --   ALKPHOS 55  --   --   --   --   BILITOT 0.4  --   --   --   --   PROT 6.7  --   --   --   --   ALBUMIN 2.2* 2.1* 2.5* 3.2* 2.8*    CBG: Recent Labs  Lab 03/22/23 0744 03/22/23 1146 03/22/23 1643 03/22/23 2112 03/23/23 0734  GLUCAP 145* 143* 250* 336* 169*     Recent Results (  from the past 240 hour(s))  Resp panel by RT-PCR (RSV, Flu A&B, Covid) Anterior Nasal Swab     Status: None   Collection Time: 03/14/23  1:51 PM   Specimen: Anterior Nasal Swab  Result Value Ref Range Status   SARS Coronavirus 2 by RT PCR NEGATIVE NEGATIVE Final    Comment: (NOTE) SARS-CoV-2 target nucleic acids are NOT DETECTED.  The SARS-CoV-2 RNA is generally detectable in upper respiratory specimens during the acute phase of infection. The lowest concentration of SARS-CoV-2 viral copies this assay can detect is 138 copies/mL. A negative result does not preclude SARS-Cov-2 infection and should not be used as the sole basis for treatment or other patient management  decisions. A negative result may occur with  improper specimen collection/handling, submission of specimen other than nasopharyngeal swab, presence of viral mutation(s) within the areas targeted by this assay, and inadequate number of viral copies(<138 copies/mL). A negative result must be combined with clinical observations, patient history, and epidemiological information. The expected result is Negative.  Fact Sheet for Patients:  BloggerCourse.com  Fact Sheet for Healthcare Providers:  SeriousBroker.it  This test is no t yet approved or cleared by the [place] FDA and  has been authorized for detection and/or diagnosis of SARS-CoV-2 by FDA under an Emergency Use Authorization (EUA). This EUA will remain  in effect (meaning this test can be used) for the duration of the COVID-19 declaration under Section 564(b)(1) of the Act, 21 U.S.C.section 360bbb-3(b)(1), unless the authorization is terminated  or revoked sooner.       Influenza A by PCR NEGATIVE NEGATIVE Final   Influenza B by PCR NEGATIVE NEGATIVE Final    Comment: (NOTE) The Xpert Xpress SARS-CoV-2/FLU/RSV plus assay is intended as an aid in the diagnosis of influenza from Nasopharyngeal swab specimens and should not be used as a sole basis for treatment. Nasal washings and aspirates are unacceptable for Xpert Xpress SARS-CoV-2/FLU/RSV testing.  Fact Sheet for Patients: BloggerCourse.com  Fact Sheet for Healthcare Providers: SeriousBroker.it  This test is not yet approved or cleared by the [place] FDA and has been authorized for detection and/or diagnosis of SARS-CoV-2 by FDA under an Emergency Use Authorization (EUA). This EUA will remain in effect (meaning this test can be used) for the duration of the COVID-19 declaration under Section 564(b)(1) of the Act, 21 U.S.C. section 360bbb-3(b)(1), unless the  authorization is terminated or revoked.     Resp Syncytial Virus by PCR NEGATIVE NEGATIVE Final    Comment: (NOTE) Fact Sheet for Patients: BloggerCourse.com  Fact Sheet for Healthcare Providers: SeriousBroker.it  This test is not yet approved or cleared by the [place] FDA and has been authorized for detection and/or diagnosis of SARS-CoV-2 by FDA under an Emergency Use Authorization (EUA). This EUA will remain in effect (meaning this test can be used) for the duration of the COVID-19 declaration under Section 564(b)(1) of the Act, 21 U.S.C. section 360bbb-3(b)(1), unless the authorization is terminated or revoked.  Performed at [facility], [address], [place], [place] [address]   Culture, blood (routine x 2)     Status: None   Collection Time: 03/14/23  1:51 PM   Specimen: BLOOD  Result Value Ref Range Status   Specimen Description   Final    BLOOD RIGHT ANTECUBITAL Performed at [facility], [address], [place], [place] [address]    Special Requests   Final    BOTTLES DRAWN AEROBIC AND ANAEROBIC Blood Culture  adequate volume Performed at [facility], [address] [place], [place] [address]    Culture   Final    NO GROWTH 5 DAYS Performed at [facility], [address] [place], [place] [address]    Report Status 03/19/2023 FINAL  Final  Culture, blood (routine x 2)     Status: Abnormal   Collection Time: 03/14/23  2:57 PM   Specimen: BLOOD RIGHT ARM  Result Value Ref Range Status   Specimen Description   Final    BLOOD RIGHT ARM Performed at [facility], [address], [place], [place] [address]    Special Requests   Final    BOTTLES DRAWN AEROBIC AND ANAEROBIC Blood Culture adequate volume Performed at [facility], [address], [place], [place] [address]    Culture  Setup Time   Final    GRAM  POSITIVE COCCI IN CLUSTERS AEROBIC BOTTLE ONLY CRITICAL RESULT CALLED TO, READ BACK BY AND VERIFIED WITH: PHARMD LEANN POINDEXTER ON 03/18/23 @ 2233 BY DRT    Culture (A)  Final    STAPHYLOCOCCUS EPIDERMIDIS THE SIGNIFICANCE OF ISOLATING THIS ORGANISM FROM A SINGLE SET OF BLOOD CULTURES WHEN MULTIPLE SETS ARE DRAWN IS UNCERTAIN. PLEASE NOTIFY THE MICROBIOLOGY DEPARTMENT WITHIN ONE WEEK IF SPECIATION AND SENSITIVITIES ARE REQUIRED. Performed at [facility], [address], [place], [place] [address]    Report Status 03/19/2023 FINAL  Final  Blood Culture ID Panel (Reflexed)     Status: Abnormal   Collection Time: 03/14/23  2:57 PM  Result Value Ref Range Status   Enterococcus faecalis NOT DETECTED NOT DETECTED Final   Enterococcus Faecium NOT DETECTED NOT DETECTED Final   Listeria monocytogenes NOT DETECTED NOT DETECTED Final   Staphylococcus species DETECTED (A) NOT DETECTED Final    Comment: CRITICAL RESULT CALLED TO, READ BACK BY AND VERIFIED WITH: PHARMD LEANN POINDEXTER ON 03/18/23 @ 2233 BY DRT    Staphylococcus aureus (BCID) NOT DETECTED NOT DETECTED Final   Staphylococcus epidermidis DETECTED (A) NOT DETECTED Final    Comment: Methicillin (oxacillin) resistant coagulase negative staphylococcus. Possible blood culture contaminant (unless isolated from more than one blood culture draw or clinical case suggests pathogenicity). No antibiotic treatment is indicated for blood  culture contaminants. CRITICAL RESULT CALLED TO, READ BACK BY AND VERIFIED WITH: PHARMD LEANN POINDEXTER ON 03/18/23 @ 2233 BY DRT    Staphylococcus lugdunensis NOT DETECTED NOT DETECTED Final   Streptococcus species NOT DETECTED NOT DETECTED Final   Streptococcus agalactiae NOT DETECTED NOT DETECTED Final   Streptococcus pneumoniae NOT DETECTED NOT DETECTED Final   Streptococcus pyogenes NOT DETECTED NOT DETECTED Final   A.calcoaceticus-baumannii NOT DETECTED NOT DETECTED Final   Bacteroides fragilis  NOT DETECTED NOT DETECTED Final   Enterobacterales NOT DETECTED NOT DETECTED Final   Enterobacter cloacae complex NOT DETECTED NOT DETECTED Final   Escherichia coli NOT DETECTED NOT DETECTED Final   Klebsiella aerogenes NOT DETECTED NOT DETECTED Final   Klebsiella oxytoca NOT DETECTED NOT DETECTED Final   Klebsiella pneumoniae NOT DETECTED NOT DETECTED Final   Proteus species NOT DETECTED NOT DETECTED Final   Salmonella species NOT DETECTED NOT DETECTED Final   Serratia marcescens NOT DETECTED NOT DETECTED Final   Haemophilus influenzae NOT DETECTED NOT DETECTED Final   Neisseria meningitidis NOT DETECTED NOT DETECTED Final   Pseudomonas aeruginosa NOT DETECTED NOT DETECTED Final   Stenotrophomonas maltophilia NOT DETECTED NOT DETECTED Final   Candida albicans NOT DETECTED  NOT DETECTED Final   Candida auris NOT DETECTED NOT DETECTED Final   Candida glabrata NOT DETECTED NOT DETECTED Final   Candida krusei NOT DETECTED NOT DETECTED Final   Candida parapsilosis NOT DETECTED NOT DETECTED Final   Candida tropicalis NOT DETECTED NOT DETECTED Final   Cryptococcus neoformans/gattii NOT DETECTED NOT DETECTED Final   Methicillin resistance mecA/C DETECTED (A) NOT DETECTED Final    Comment: CRITICAL RESULT CALLED TO, READ BACK BY AND VERIFIED WITH: PHARMD LEANN POINDEXTER ON 03/18/23 @ 2233 BY DRT Performed at [facility], [address] [place], [place] [address]   MRSA Next Gen by PCR, Nasal     Status: None   Collection Time: 03/14/23 10:15 PM   Specimen: Nasal Mucosa; Nasal Swab  Result Value Ref Range Status   MRSA by PCR Next Gen NOT DETECTED NOT DETECTED Final    Comment: (NOTE) The GeneXpert MRSA Assay (FDA approved for NASAL specimens only), is one component of a comprehensive MRSA colonization surveillance program. It is not intended to diagnose MRSA infection nor to guide or monitor treatment for MRSA infections. Test performance is not FDA approved in patients less than  40 years old. Performed at [facility], [address] [place], [place] [address]   Urine Culture (for pregnant, neutropenic or urologic patients or patients with an indwelling urinary catheter)     Status: Abnormal   Collection Time: 03/17/23  3:18 PM   Specimen: Urine, Catheterized  Result Value Ref Range Status   Specimen Description   Final    URINE, CATHETERIZED Performed at [facility], [address] [place], [place] [address]    Special Requests   Final    NONE Performed at [facility], [address] [place], [place] [address]    Culture 30,000 COLONIES/mL YEAST (A)  Final   Report Status 03/18/2023 FINAL  Final         Radiology Studies: No results found.      Scheduled Meds:  amLODipine  5 mg Oral Daily   apixaban  10 mg Oral BID   Followed by   Melene Muller ON 03/28/2023] apixaban  5 mg Oral BID   budesonide (PULMICORT) nebulizer solution  0.5 mg Nebulization BID   Chlorhexidine Gluconate Cloth  6 each Topical Daily   cyanocobalamin  1,000 mcg Subcutaneous Q1200   donepezil  10 mg Oral QHS   feeding supplement (GLUCERNA SHAKE)  237 mL Oral BID BM   fluticasone  2 spray Each Nare Daily   Gerhardt's butt cream   Topical BID   insulin aspart  0-9 Units Subcutaneous TID WC   insulin glargine-yfgn  10 Units Subcutaneous Daily   ipratropium-albuterol  3 mL Nebulization TID   loratadine  10 mg Oral Daily   nystatin  5 mL Oral QID   mouth rinse  15 mL Mouth Rinse 4 times per day   pantoprazole  40 mg Oral Daily   predniSONE  20 mg Oral QAC breakfast   sodium zirconium cyclosilicate  10 g Oral TID   Continuous Infusions:  cefTRIAXone (ROCEPHIN)  IV Stopped (03/22/23 2245)   metronidazole Stopped (03/23/23 0219)     LOS: 9 days    Time spent: 40 minutes    Ramiro Harvest, MD [facility]   To contact the attending provider between 7A-7P or the covering provider during after hours  7P-7A, please log into the web site [email or web address] and access using universal [place] password  for that web site. If you do not have the password, please call the hospital operator.  03/23/2023, 10:11 AM

## 2023-03-23 NOTE — Plan of Care (Signed)
  Problem: Education: Goal: Knowledge of General Education information will improve Description: Including pain rating scale, medication(s)/side effects and non-pharmacologic comfort measures Outcome: Not Progressing   Problem: Clinical Measurements: Goal: Respiratory complications will improve Outcome: Not Progressing   Problem: Activity: Goal: Risk for activity intolerance will decrease Outcome: Not Progressing

## 2023-03-23 NOTE — Progress Notes (Signed)
Physical Therapy Treatment Patient Details Name: Kathy Thomas MRN: [id number] DOB: Nov 27, 1934 Today's Date: 03/23/2023   History of Present Illness 88 y.o. female with medical history significant of dementia, hypertension who presented to the emergency department due to shortness of breath.  Patient was found to be hypoxic 3 days ago and underwent chest x-ray which showed concern for community-acquired pneumonia.  She was started on ertapenem and received 3 doses.  By the nursing facility she developed tachypnea and shortness of breath so EMS was called to bring her to the emergency department further assessment.  On EMS arrival she was found to be hypoglycemic.  She was brought to the ER where she was hypoxic requiring up to 6 L nasal cannula. Pt was admitted for concerns of cavitary PNA    PT Comments  Pt is a Long Term Resident at [facility] in [place].  Per Daughter who is a Retired Charity fundraiser stated, Pt was walking when she first was placed but because she was considered a fall risk, they have kept her Mom  in a wheelchair.  Daughter stated, pt was able to transfer with assist.  Daughter also shared frustration that pt has gained over 40 pounds.   Pt currently seen in Step Down ICU RM# [id number].  Pt on 2 lts oxygen at 95%.  Attempted transfer to EOB was VERY difficult.  General bed mobility comments: Pt requiredTotal Assist for all bedmobility Pt <5%.  Assisted to EOB was VERY difficult due to weakness and body habitus.  Pt present with severe LEFT lean and collapsed posture. Attempted sit to stand + 2 side by side assist however pt was unable to clear hips off bed no more than 5% weight shift forward.  Rec MAX SKY.  Returned back to supine pt had a BM.  Assisted RN with peri care, bed change and repositioned.  Extended Tx time to assist RN. Pt plans to return to SNF.  Daughter is hoping pt returns under "Rehab" Days.      If plan is discharge home, recommend the following: A lot of help with  bathing/dressing/bathroom;Direct supervision/assist for medications management;Two people to help with walking and/or transfers;Two people to help with bathing/dressing/bathroom   Can travel by private vehicle     No  Equipment Recommendations  None recommended by PT    Recommendations for Other Services       Precautions / Restrictions Precautions Precautions: Fall Precaution Comments: body habitus, monitor sats Restrictions Weight Bearing Restrictions: No     Mobility  Bed Mobility Overal bed mobility: Needs Assistance Bed Mobility: Rolling, Supine to Sit, Sit to Supine Rolling: +2 for physical assistance, +2 for safety/equipment, Total assist   Supine to sit: Total assist, +2 for safety/equipment, +2 for physical assistance, HOB elevated Sit to supine: Total assist, +2 for physical assistance, +2 for safety/equipment   General bed mobility comments: Pt requiredTotal Assist for all bedmobility Pt <5%.  Assisted to EOB was VERY difficult due to weakness and body habitus.  Pt present with severe LEFT lean and collapsed posture.    Transfers Overall transfer level: Needs assistance Equipment used: Rolling walker (2 wheels) Transfers: Sit to/from Stand Sit to Stand: Total assist, +2 physical assistance, +2 safety/equipment, From elevated surface           General transfer comment: attempted sit to stand + 2 side by side assist however pt was unable to clear hips off bed no more than 5% weight shift forward.  Rec MAX SKY.  Ambulation/Gait                   Stairs             Wheelchair Mobility     Tilt Bed    Modified Rankin (Stroke Patients Only)       Balance                                            Cognition Arousal: Alert   Overall Cognitive Status: History of cognitive impairments - at baseline                                 General Comments: AxO x 1 intermittent, pleasant, tends to laugh with  responses, unable to recall daughters name who was in room, folloing repeat simple VC's.        Exercises      General Comments        Pertinent Vitals/Pain Pain Assessment Pain Assessment: No/denies pain    Home Living                          Prior Function            PT Goals (current goals can now be found in the care plan section) Progress towards PT goals: Progressing toward goals    Frequency    Min 1X/week      PT Plan      Co-evaluation              AM-PAC PT "6 Clicks" Mobility   Outcome Measure  Help needed turning from your back to your side while in a flat bed without using bedrails?: Total Help needed moving from lying on your back to sitting on the side of a flat bed without using bedrails?: Total Help needed moving to and from a bed to a chair (including a wheelchair)?: Total Help needed standing up from a chair using your arms (e.g., wheelchair or bedside chair)?: Total Help needed to walk in hospital room?: Total Help needed climbing 3-5 steps with a railing? : Total 6 Click Score: 6    End of Session Equipment Utilized During Treatment: Gait belt Activity Tolerance: Patient limited by fatigue Patient left: in bed;with bed alarm set;with call bell/phone within reach;with family/visitor present Nurse Communication: Mobility status;Need for lift equipment PT Visit Diagnosis: Other abnormalities of gait and mobility (R26.89)     Time: 1200-1240 PT Time Calculation (min) (ACUTE ONLY): 40 min  Charges:    $Therapeutic Activity: 38-52 mins PT General Charges $$ ACUTE PT VISIT: 1 Visit                     Felecia Shelling  PTA Acute  Rehabilitation Services Office M-F          [phone]

## 2023-03-24 ENCOUNTER — Inpatient Hospital Stay (HOSPITAL_COMMUNITY): Payer: Medicare HMO

## 2023-03-24 DIAGNOSIS — J189 Pneumonia, unspecified organism: Secondary | ICD-10-CM | POA: Diagnosis not present

## 2023-03-24 DIAGNOSIS — J984 Other disorders of lung: Secondary | ICD-10-CM | POA: Diagnosis not present

## 2023-03-24 LAB — CBC WITH DIFFERENTIAL/PLATELET
Abs Immature Granulocytes: 0.07 10*3/uL (ref 0.00–0.07)
Basophils Absolute: 0 10*3/uL (ref 0.0–0.1)
Basophils Relative: 0 %
Eosinophils Absolute: 0 10*3/uL (ref 0.0–0.5)
Eosinophils Relative: 0 %
HCT: 40.3 % (ref 36.0–46.0)
Hemoglobin: 11.3 g/dL — ABNORMAL LOW (ref 12.0–15.0)
Immature Granulocytes: 1 %
Lymphocytes Relative: 13 %
Lymphs Abs: 1.1 10*3/uL (ref 0.7–4.0)
MCH: 25.3 pg — ABNORMAL LOW (ref 26.0–34.0)
MCHC: 28 g/dL — ABNORMAL LOW (ref 30.0–36.0)
MCV: 90.2 fL (ref 80.0–100.0)
Monocytes Absolute: 0.6 10*3/uL (ref 0.1–1.0)
Monocytes Relative: 8 %
Neutro Abs: 6.6 10*3/uL (ref 1.7–7.7)
Neutrophils Relative %: 78 %
Platelets: 183 10*3/uL (ref 150–400)
RBC: 4.47 MIL/uL (ref 3.87–5.11)
RDW: 16.6 % — ABNORMAL HIGH (ref 11.5–15.5)
WBC: 8.4 10*3/uL (ref 4.0–10.5)
nRBC: 0 % (ref 0.0–0.2)

## 2023-03-24 LAB — RENAL FUNCTION PANEL
Albumin: 2.7 g/dL — ABNORMAL LOW (ref 3.5–5.0)
Anion gap: 9 (ref 5–15)
BUN: 46 mg/dL — ABNORMAL HIGH (ref 8–23)
CO2: 31 mmol/L (ref 22–32)
Calcium: 8.7 mg/dL — ABNORMAL LOW (ref 8.9–10.3)
Chloride: 99 mmol/L (ref 98–111)
Creatinine, Ser: 0.82 mg/dL (ref 0.44–1.00)
GFR, Estimated: >60 mL/min (ref >60)
Glucose, Bld: 197 mg/dL — ABNORMAL HIGH (ref 70–99)
Phosphorus: 3.5 mg/dL (ref 2.5–4.6)
Potassium: 4.3 mmol/L (ref 3.5–5.1)
Sodium: 139 mmol/L (ref 135–145)

## 2023-03-24 LAB — GLUCOSE, CAPILLARY
Glucose-Capillary: 128 mg/dL — ABNORMAL HIGH (ref 70–99)
Glucose-Capillary: 139 mg/dL — ABNORMAL HIGH (ref 70–99)
Glucose-Capillary: 250 mg/dL — ABNORMAL HIGH (ref 70–99)
Glucose-Capillary: 203 mg/dL — ABNORMAL HIGH (ref 70–99)

## 2023-03-24 LAB — MAGNESIUM: Magnesium: 2 mg/dL (ref 1.7–2.4)

## 2023-03-24 MED ORDER — METRONIDAZOLE 500 MG PO TABS
500.0000 mg | ORAL_TABLET | Freq: Two times a day (BID) | ORAL | Status: DC
  Administered 2023-03-25 – 2023-04-02 (×16): 500 mg via ORAL
  Filled 2023-03-24 (×18): qty 1

## 2023-03-24 MED ORDER — CEFDINIR 300 MG PO CAPS
300.0000 mg | ORAL_CAPSULE | Freq: Two times a day (BID) | ORAL | Status: DC
  Administered 2023-03-24 – 2023-04-02 (×18): 300 mg via ORAL
  Filled 2023-03-24 (×18): qty 1

## 2023-03-24 MED ORDER — IPRATROPIUM-ALBUTEROL 0.5-2.5 (3) MG/3ML IN SOLN
3.0000 mL | Freq: Two times a day (BID) | RESPIRATORY_TRACT | Status: DC
  Administered 2023-03-24 – 2023-04-02 (×19): 3 mL via RESPIRATORY_TRACT
  Filled 2023-03-24 (×19): qty 3

## 2023-03-24 MED ORDER — CLINDAMYCIN HCL 300 MG PO CAPS: 300.0000 mg | ORAL_CAPSULE | Freq: Three times a day (TID) | ORAL | Status: DC

## 2023-03-24 NOTE — Progress Notes (Addendum)
PROGRESS NOTE    Kathy Thomas  ZOX:[id number] DOB: 04/14/1935 DOA: 03/14/2023 PCP: Marden Noble, MD (Inactive)   Chief Complaint  Patient presents with   Hypoglycemia   Shortness of Breath    Brief Narrative:  88 y.o. female with medical history significant of dementia, hypertension who presented to the emergency department due to shortness of breath.  Patient was found to be hypoxic 3 days ago and underwent chest x-ray which showed concern for community-acquired pneumonia.  She was started on ertapenem and received 3 doses.  By the nursing facility she developed tachypnea and shortness of breath so EMS was called to bring her to the emergency department further assessment.  On EMS arrival she was found to be hypoglycemic.  She was brought to the ER where she was hypoxic requiring up to 6 L nasal cannula. Pt was admitted for concerns of cavitary PNA    Assessment & Plan:   Principal Problem:   Cavitary pneumonia Active Problems:   Pulmonary embolism (HCC)   DVT (deep venous thrombosis) (HCC)   DM type 2 (diabetes mellitus, type 2) (HCC)   HTN (hypertension)   Dementia without behavioral disturbance (HCC)   Diabetes mellitus without complication (HCC)   Pressure injury of skin   Hyperkalemia   AKI (acute kidney injury) (HCC)   Oral thrush   Hypernatremia   Dysphagia   OSA (obstructive sleep apnea)   #1 cavitary pneumonia -Patient noted to have presented with altered mental status, tachypnea, worsening shortness of breath and noted to have been hypoxic 3 days prior to admission underwent chest x-ray concerning for community-acquired pneumonia. -CT chest done with findings notable for a large masslike consolidation of the RML with central groundglass, cavitation, peripheral ring of dense consolidation concerning for pulmonary infarct versus atypical cavitary pneumonia.  Small right pleural effusion noted. -Patient noted to have completed ertapenem prior to  admission. -Patient was placed on BiPAP 03/17/2023 with clinical improvement currently on 4 L nasal cannula with sats of 96-98%. -Patient seen in consultation by pulmonary who assessed the patient and recommending continuation of antibiotics of IV Rocephin and Flagyl and recommended total of 4   weeks of antibiotics await reimaging in the outpatient setting. PCCM is ok switching from iv abx (ceftriaxone/flagyl) to orals today. Will start cefdinir/flagyl (pcn allergy), after discussion w/ rph regarding abx -Patient assessed by SLP.  2.  PE/left popliteal DVT -Noted on V/Q scan with findings confirming PE. -Lower extremity Dopplers with left lower extremity DVT. -2D echo with EF of 55 to 60%, mild concentric LVH, grade 1 DD, full study not completed as patient noted to have refused exam. -Was on IV heparin was transitioned to full dose Lovenox and subsequently transition to Eliquis on 03/21/2023. -Per pulmonary likely need lifelong DOAC as patient at baseline with a sedentary lifestyle..  3.  Acute metabolic encephalopathy/acute respiratory failure with hypercarbia/probable undiagnosed OSA/OHS -Likely multifactorial secondary to hypercarbia in the setting of acute illness of cavitary pneumonia, with prior history of tobacco use. -Patient with no focal neurological deficits however daughter was concerned due to patient's family history and current mental status concern for possible CVA. -CT head done on admission negative for any acute abnormalities. -Repeat head CT negative for any acute abnormalities.. -Ammonia levels slightly elevated at 41 and trended down with lactulose. -Patient with clinical improvement after being placed on BiPAP -IV Solu-Medrol has been transitioned to oral prednisone taper.  Continue PPI.  -BiPAP as needed and nightly. -Patient likely with probable undiagnosed  OSA/OHS. -Lasix ordered per PCCM for trial. -Per PCCM patient will qualify for outpatient NIV by her chronic  bicarb elevation. -Will need to determine if this can safely be used at her SNF as per family patient is known to pocket food and has dysphagia. -Pulmonary following and feel patient would likely need NIV at discharge. TOC says her facility can support that but needs to be ordered and then takes 24-48 hours. TOC obtaining those bipap settings today  4.  Hyperkalemia Treated w/ lokelma, resolved today - monitor  5.  AKI -Likely secondary to prerenal azotemia in the setting of ARB, diuretics.  Patient also noted to be on Jardiance prior to admission. -Renal function improved.   -Continue to hold ARB   6.  Oral thrush -Clinical improvement.   -Nystatin swish and swallow.   7.  Hypertension -BP has improved.   -ARB,on hold. -IV hydralazine as needed. -Patient with some bradycardia early on and during the hospitalization IV Lopressor was initially held.  -Patient with some bradycardia while sleeping but improved with arousal.  8.  Cognitive impairment -Aricept.  9.  diabetes mellitus type 2 Glucose mild elevation, is on steroids - continue basal/ssi insulin   10.  Bilateral upper extremity swelling -Likely secondary to third spacing patient with hypoalbuminemia. -Status post IV albumin, Lasix 40 mg IV x 1 with significant improvement with upper extremity swelling.    11.  Hypernatremia -Resolved with D5W and half-normal saline.   -Follow.  12.  Dysphagia -Patient initially was on dysphagia 1 diet, difficulty swallowing initially on presentation was likely due to patient's lethargy. -Patient more alert, reassessed by SLP and diet advanced to a dysphagia 3 diet. -Daughter does state patient occasionally pockets food. -SLP following and appreciate input and recommendations.  13.  Sinus bradycardia -Patient noted bradycardic in the mid to high 40s to 50s while on BiPAP and sleeping. -Heart rate improves with awakening/arousal. -Check a EKG. -Follow.   14.  Pressure injury  buttock stage II, POA Pressure Injury 03/14/23 Buttocks Stage 2 -  Partial thickness loss of dermis presenting as a shallow open injury with a red, pink wound bed without slough. (Active)  03/14/23 1830  Location: Buttocks  Location Orientation:   Staging: Stage 2 -  Partial thickness loss of dermis presenting as a shallow open injury with a red, pink wound bed without slough.  Wound Description (Comments):   Present on Admission: Yes       DVT prophylaxis: Eliquis Code Status: Full Family Communication: Updated daughter at bedside 10/30 Disposition: Remain in SDU.  Likely back to SNF when medically stable.    Status is: Inpatient Remains inpatient appropriate because: Severity of illness   Consultants:  PCCM: Dr. Isaiah Serge 03/16/2023  Procedures:  CT head 03/14/2023, 03/17/2023 CT chest 03/14/2023 Left upper extremity Dopplers 03/16/2023 Lower extremity Dopplers 03/16/2023 2D echo 03/15/2023  Antimicrobials:  Anti-infectives (From admission, onward)    Start     Dose/Rate Route Frequency Ordered Stop   03/16/23 2200  cefTRIAXone (ROCEPHIN) 2 g in sodium chloride 0.9 % 100 mL IVPB        2 g 200 mL/hr over 30 Minutes Intravenous Every 24 hours 03/16/23 1322     03/16/23 1400  metroNIDAZOLE (FLAGYL) IVPB 500 mg        500 mg 100 mL/hr over 60 Minutes Intravenous Every 12 hours 03/16/23 1322     03/16/23 0915  ceFEPIme (MAXIPIME) 2 g in sodium chloride 0.9 % 100 mL IVPB  Status:  Discontinued        2 g 200 mL/hr over 30 Minutes Intravenous Every 12 hours 03/16/23 0904 03/16/23 1322   03/15/23 1800  ceFEPIme (MAXIPIME) 2 g in sodium chloride 0.9 % 100 mL IVPB  Status:  Discontinued        2 g 200 mL/hr over 30 Minutes Intravenous Every 24 hours 03/14/23 2010 03/16/23 0904   03/14/23 1954  vancomycin variable dose per unstable renal function (pharmacist dosing)  Status:  Discontinued         Does not apply See admin instructions 03/14/23 1954 03/15/23 0744   03/14/23 1930   vancomycin (VANCOCIN) IVPB 1000 mg/200 mL premix        1,000 mg 200 mL/hr over 60 Minutes Intravenous  Once 03/14/23 1842 03/15/23 0700   03/14/23 1700  vancomycin (VANCOCIN) IVPB 1000 mg/200 mL premix        1,000 mg 200 mL/hr over 60 Minutes Intravenous  Once 03/14/23 1652 03/15/23 0700   03/14/23 1700  ceFEPIme (MAXIPIME) 2 g in sodium chloride 0.9 % 100 mL IVPB        2 g 200 mL/hr over 30 Minutes Intravenous  Once 03/14/23 1652 03/15/23 0700         Subjective: Patient sitting up in bed getting fed by her daughter.  Patient pleasantly confused. Tolerating diet. Denies dyspnea. Regular BMs  Objective: Vitals:   03/24/23 0400 03/24/23 0600 03/24/23 0758 03/24/23 1200  BP: (!) 136/48     Pulse: (!) 42     Resp: 20     Temp: 98.1 F (36.7 C)  98.6 F (37 C) 98 F (36.7 C)  TempSrc: Axillary  Oral Oral  SpO2: 96%     Weight:  122.7 kg    Height:        Intake/Output Summary (Last 24 hours) at 03/24/2023 1551 Last data filed at 03/24/2023 0703 Gross per 24 hour  Intake 291.88 ml  Output 2625 ml  Net -2333.12 ml   Filed Weights   03/14/23 1803 03/17/23 1800 03/24/23 0600  Weight: 116.6 kg 119.8 kg 122.7 kg    Examination:  General exam: Awake alert. Chronically ill appearing Respiratory system: clear, no tachypnea Cardiovascular system: Bradycardia.  No JVD.  No murmurs rubs or gallops.  Trace to 1+ bilateral lower extremity edema.  Gastrointestinal system: Abdomen is obese, soft, nontender, nondistended,  Central nervous system: Alert and oriented to self, moving all 4 equally Extremities: warm, mild edema LEs Skin: Ecchymosis on left hand and right hand.  Psychiatry: calm    Data Reviewed: I have personally reviewed following labs and imaging studies  CBC: Recent Labs  Lab 03/20/23 0307 03/21/23 0303 03/22/23 0311 03/23/23 0313 03/24/23 0320  WBC 8.3 8.0 7.4 8.0 8.4  NEUTROABS  --   --  6.1  --  6.6  HGB 11.3* 12.4 11.2* 11.3* 11.3*  HCT 42.1  46.3* 42.4 40.7 40.3  MCV 94.4 93.7 95.9 92.1 90.2  PLT 232 231 189 184 183    Basic Metabolic Panel: Recent Labs  Lab 03/19/23 0318 03/20/23 0307 03/21/23 0303 03/21/23 1148 03/22/23 0311 03/23/23 0313 03/24/23 0320  NA 145 153* 146*  --  141 141 139  K 4.7 4.7 5.4* 4.7 5.3* 5.4* 4.3  CL 110 117* 111  --  106 104 99  CO2 28 30 28   --  29 28 31   GLUCOSE 281* 193* 261*  --  239* 236* 197*  BUN 39* 44* 45*  --  43* 43* 46*  CREATININE 0.95 0.82 0.87  --  0.77 0.62 0.82  CALCIUM 8.1* 8.4* 8.3*  --  8.7* 8.9 8.7*  MG 2.5*  --   --   --   --   --  2.0  PHOS  --  2.1* 3.0  --  3.3 3.2 3.5    GFR: Estimated Creatinine Clearance: 61.6 mL/min (by C-G formula based on SCr of 0.82 mg/dL).  Liver Function Tests: Recent Labs  Lab 03/18/23 0347 03/20/23 0307 03/22/23 0311 03/23/23 0313 03/24/23 0320  ALBUMIN 2.1* 2.5* 3.2* 2.8* 2.7*    CBG: Recent Labs  Lab 03/23/23 1300 03/23/23 1604 03/23/23 2139 03/24/23 0749 03/24/23 1220  GLUCAP 139* 240* 300* 128* 139*     Recent Results (from the past 240 hour(s))  MRSA Next Gen by PCR, Nasal     Status: None   Collection Time: 03/14/23 10:15 PM   Specimen: Nasal Mucosa; Nasal Swab  Result Value Ref Range Status   MRSA by PCR Next Gen NOT DETECTED NOT DETECTED Final    Comment: (NOTE) The GeneXpert MRSA Assay (FDA approved for NASAL specimens only), is one component of a comprehensive MRSA colonization surveillance program. It is not intended to diagnose MRSA infection nor to guide or monitor treatment for MRSA infections. Test performance is not FDA approved in patients less than 38 years old. Performed at [facility], [address], [place], [place] [address]   Urine Culture (for pregnant, neutropenic or urologic patients or patients with an indwelling urinary catheter)     Status: Abnormal   Collection Time: 03/17/23  3:18 PM   Specimen: Urine, Catheterized  Result Value Ref Range Status    Specimen Description   Final    URINE, CATHETERIZED Performed at [facility], [address], [place], [place] [address]    Special Requests   Final    NONE Performed at [facility], [address], [place], [place] [address]    Culture 30,000 COLONIES/mL YEAST (A)  Final   Report Status 03/18/2023 FINAL  Final         Radiology Studies: DG CHEST PORT 1 VIEW  Result Date: 03/24/2023 CLINICAL DATA:  Pneumonia. EXAM: PORTABLE CHEST 1 VIEW COMPARISON:  March 14, 2023. FINDINGS: Stable cardiomegaly. Bibasilar opacities are noted concerning for pneumonia or atelectasis. Probable small right pleural effusion. Bony thorax is unremarkable. IMPRESSION: Bibasilar atelectasis or infiltrates are noted with small right pleural effusion. Electronically Signed   By: Lupita Raider M.D.   On: 03/24/2023 13:32        Scheduled Meds:  amLODipine  5 mg Oral Daily   apixaban  10 mg Oral BID   Followed by   Melene Muller ON 03/28/2023] apixaban  5 mg Oral BID   budesonide (PULMICORT) nebulizer solution  0.5 mg Nebulization BID   Chlorhexidine Gluconate Cloth  6 each Topical Daily   cyanocobalamin  1,000 mcg Subcutaneous Q1200   donepezil  10 mg Oral QHS   feeding supplement (GLUCERNA SHAKE)  237 mL Oral BID BM   fluticasone  2 spray Each Nare Daily   Gerhardt's butt cream   Topical BID   insulin aspart  0-9 Units Subcutaneous TID WC   insulin glargine-yfgn  10 Units Subcutaneous Daily   ipratropium-albuterol  3 mL Nebulization BID   loratadine  10 mg Oral Daily   nystatin  5 mL Oral QID   mouth rinse  15 mL Mouth Rinse 4  times per day   pantoprazole  40 mg Oral Daily   predniSONE  20 mg Oral QAC breakfast   Continuous Infusions:  cefTRIAXone (ROCEPHIN)  IV Stopped (03/23/23 2240)   metronidazole Stopped (03/24/23 0221)     LOS: 10 days    Time spent: 40 minutes    Silvano Bilis, MD [facility]   To contact the attending provider  between 7A-7P or the covering provider during after hours 7P-7A, please log into the web site [email or web address] and access using universal Keith password for that web site. If you do not have the password, please call the hospital operator.  03/24/2023, 3:51 PM

## 2023-03-24 NOTE — TOC Progression Note (Addendum)
Transition of Care [facility]) - Progression Note    Patient Details  Name: Kathy Thomas MRN: [id number] Date of Birth: [birth year]  Transition of [facility]) CM/SW Contact  Darleene Cleaver, [place] Phone Number: 03/24/2023, 5:45 PM  Clinical Narrative:     CSW received a message that patient's daughter would like to see what other options are available for SNF placement.  Per patient's daughter she has some concerns about current SNF that patient was from.  Patient has been at [facility] for about 1.5 years.  CSW was also informed that patient will need BIPAP at facility, French Ana at [facility] is aware and can order prior to patient returning, however it will take 24-48 hours.  Per patient's daughter would patient to possiblly go to a different facility under short term rehab, then transition to LTC.  CSW explained to daughter, that insurance would have to approve her for short term rehab.  Per PT notes, patient has dementia, and does not seem to be able to participate in therapy very much.  CSW explained to patient's daughter if insurance does not approve patient or there are no other available SNFs she would have to return back to [facility].  Patient's daughter stated she understands and will not oppose returning back to facility.  Then patient's daughter stated that if she is denied placement anywhere else, she knows there are ways around it.    CSW discussed with [name] that there are not many facilities that are able to accept a LTC Medicaid patient, she expressed understanding and still requested that CSW look at other facilities to see what's available.  CSW explained to her, that typically patients who are LTC residents will be discharged back to the SNF they were at, and families would have to coordinate with the social worker at the facility to find a different placement.    Per patient's daughter she would like [facility] if possible, CSW contacted [facility] and spoke  to [place], they do not have any rehab beds available at this time.  Whitney provided phone number for the LTC admissions worker [phone], CSW called and left a message on voice mail for her to call back.  CSW updated patient's daughter that message was sent, and CSW also notified attending physician and bedside nurse.  CSW to continue to follow patient's progress throughout discharge planning.  Expected Discharge Plan: Skilled Nursing Facility Barriers to Discharge: No Barriers Identified  Expected Discharge Plan and Services In-house Referral: NA Discharge Planning Services: NA Post Acute Care Choice: Skilled Nursing Facility Living arrangements for the past 2 months: Skilled Nursing Facility                 DME Arranged: Bipap DME Agency: [employer] Date DME Agency Contacted: 03/19/23 Time DME Agency Contacted: 1213 Representative spoke with at DME Agency: Vaughan Basta HH Arranged:  (NA)           Social Determinants of Health (SDOH) Interventions SDOH Screenings   Food Insecurity: No Food Insecurity (03/14/2023)  Housing: Low Risk  (03/14/2023)  Transportation Needs: No Transportation Needs (03/14/2023)  Utilities: Not At Risk (03/14/2023)  Depression (PHQ2-9): Low Risk  (08/14/2020)  Tobacco Use: Medium Risk (03/14/2023)    Readmission Risk Interventions     No data to display

## 2023-03-24 NOTE — Plan of Care (Signed)
  Problem: Education: Goal: Knowledge of General Education information will improve Description: Including pain rating scale, medication(s)/side effects and non-pharmacologic comfort measures Outcome: Not Progressing   Problem: Clinical Measurements: Goal: Respiratory complications will improve Outcome: Not Progressing   Problem: Activity: Goal: Risk for activity intolerance will decrease Outcome: Not Progressing

## 2023-03-24 NOTE — [place] FL2 (Addendum)
[place] MEDICAID FL2 LEVEL OF CARE FORM     IDENTIFICATION  Patient Name: Kathy Thomas Birthdate: March 05, 1935 Sex: female Admission Date (Current Location): 03/14/2023  [place] and [place] Number:  Haynes Bast [id number] N Facility and Address:  [facility],  [address], [place] [address]      Provider Number: [id number]  Attending Physician Name and Address:  Kathrynn Running, MD  Relative Name and Phone Number:  [name] (Daughter)  [phone] (Mobile)    Current Level of Care: SNF Recommended Level of Care: Skilled Nursing facility Prior Approval Number:    Date Approved/Denied:   PASRR Number: [id number]  Discharge Plan: SNF    Current Diagnoses: Patient Active Problem List   Diagnosis Date Noted   OSA (obstructive sleep apnea) 03/22/2023   Dysphagia 03/21/2023   Hypernatremia 03/20/2023   Pressure injury of skin 03/17/2023   Hyperkalemia 03/17/2023   AKI (acute kidney injury) (HCC) 03/17/2023   DVT (deep venous thrombosis) (HCC) 03/17/2023   Oral thrush 03/17/2023   Pulmonary embolism (HCC) 03/16/2023   Cavitary pneumonia 03/14/2023   Aortic atherosclerosis (HCC) 08/14/2020   Encounter for well adult exam with abnormal findings 08/14/2020   Submandibular gland infection 03/15/2020   Pneumonia due to COVID-19 virus 02/01/2020   Acute respiratory failure with hypoxia (HCC) 02/01/2020   Protein calorie malnutrition (HCC) 02/01/2020   Thrombocytopenia (HCC) 02/01/2020   Encounter for examination for admission to assisted living facility 10/20/2019   Pain due to onychomycosis of toenails of both feet 12/20/2018   Diabetes mellitus without complication (HCC) 12/20/2018   Diarrhea 03/07/2018   Acute right ankle pain 04/08/2017   Weight loss 06/25/2016   Iron deficiency 12/30/2015   Generalized weakness 07/08/2014   Diastolic CHF (HCC) 07/07/2014   Vitamin B12 deficiency 07/06/2014   Left fibular fracture 07/05/2014   Closed  fibular fracture 07/05/2014   Left ankle pain 07/04/2014   Left knee pain 07/04/2014   Gait disorder 01/10/2014   Ruptured sebaceous cyst 10/16/2013   Dementia without behavioral disturbance (HCC) 09/08/2013   Left lumbar radiculopathy 10/28/2011   Grief reaction 10/28/2011   Insect bite, infected 10/28/2011   Preventative health care 12/06/2010   Osteoporosis 06/04/2010   ECCHYMOSES, SPONTANEOUS 06/04/2010   ANEMIA-IRON DEFICIENCY 10/26/2008   MENOPAUSAL DISORDER 10/26/2008   DM type 2 (diabetes mellitus, type 2) (HCC) 10/06/2007   Hyperlipidemia 10/06/2007   ANXIETY 10/06/2007   CHOLELITHIASIS 10/06/2007   SHOULDER PAIN, LEFT 10/06/2007   LOW BACK PAIN 01/29/2007   HTN (hypertension) 01/26/2007   GERD 01/26/2007   DIVERTICULOSIS, COLON 01/26/2007   HERNIATED DISC 01/26/2007   History of colonic polyps 01/26/2007    Orientation RESPIRATION BLADDER Height & Weight     Self  O2, Other (Comment) (BIPAP) Incontinent, External catheter Weight: 270 lb 8.1 oz (122.7 kg) Height:  5\' 1"  (154.9 cm)  BEHAVIORAL SYMPTOMS/MOOD NEUROLOGICAL BOWEL NUTRITION STATUS      Incontinent Diet (dysphagia 2 diet)  AMBULATORY STATUS COMMUNICATION OF NEEDS Skin   Extensive Assist Verbally PU Stage and Appropriate Care   PU Stage 2 Dressing:  (Stage 2 pressure injury buttocks, foam dressing change prn)                   Personal Care Assistance Level of Assistance  Bathing, Feeding, Dressing Bathing Assistance: Maximum assistance Feeding assistance: Limited assistance Dressing Assistance: Maximum assistance Total Care Assistance: Limited assistance   Functional Limitations Info  Sight, Hearing, Speech Sight Info: Impaired Hearing Info: Adequate Speech  Info: Adequate    SPECIAL CARE FACTORS FREQUENCY  PT (By licensed PT), OT (By licensed OT)     PT Frequency: Minimum 5x a week OT Frequency: Minimum 5x a week            Contractures Contractures Info: Not present     Additional Factors Info  Code Status, Allergies, Insulin Sliding Scale Code Status Info: Full Code Allergies Info: Codeine, Penicillins, Morphine   Insulin Sliding Scale Info: insulin aspart (novoLOG) injection 0-9 Units 3x a day with meals       Current Medications (03/24/2023):  This is the current hospital active medication list Current Facility-Administered Medications  Medication Dose Route Frequency Provider Last Rate Last Admin   acetaminophen (TYLENOL) tablet 650 mg  650 mg Oral Q6H PRN Alan Mulder, MD   650 mg at 03/16/23 2119   Or   acetaminophen (TYLENOL) suppository 650 mg  650 mg Rectal Q6H PRN Alan Mulder, MD       amLODipine (NORVASC) tablet 5 mg  5 mg Oral Daily Rodolph Bong, MD   5 mg at 03/24/23 8295   apixaban (ELIQUIS) tablet 10 mg  10 mg Oral BID Norva Pavlov, RPH   10 mg at 03/24/23 6213   Followed by   Melene Muller ON 03/28/2023] apixaban (ELIQUIS) tablet 5 mg  5 mg Oral BID Norva Pavlov, RPH       budesonide (PULMICORT) nebulizer solution 0.5 mg  0.5 mg Nebulization BID Rodolph Bong, MD   0.5 mg at 03/24/23 0865   cefdinir (OMNICEF) capsule 300 mg  300 mg Oral Q12H Wouk, Wilfred Curtis, MD       Chlorhexidine Gluconate Cloth 2 % PADS 6 each  6 each Topical Daily Rodolph Bong, MD   6 each at 03/23/23 2032   cyanocobalamin (VITAMIN B12) injection 1,000 mcg  1,000 mcg Subcutaneous Q1200 Rodolph Bong, MD   1,000 mcg at 03/24/23 1238   donepezil (ARICEPT) tablet 10 mg  10 mg Oral Florence Canner, MD   10 mg at 03/23/23 2209   feeding supplement (GLUCERNA SHAKE) (GLUCERNA SHAKE) liquid 237 mL  237 mL Oral BID BM Rodolph Bong, MD   237 mL at 03/22/23 1634   fluticasone (FLONASE) 50 MCG/ACT nasal spray 2 spray  2 spray Each Nare Daily Rodolph Bong, MD   2 spray at 03/24/23 7846   Gerhardt's butt cream   Topical BID Jerald Kief, MD   Given at 03/24/23 [phone]   hydrALAZINE (APRESOLINE) injection 10 mg  10 mg  Intravenous Q4H PRN Jerald Kief, MD   10 mg at 03/20/23 0249   insulin aspart (novoLOG) injection 0-9 Units  0-9 Units Subcutaneous TID WC Rodolph Bong, MD   3 Units at 03/24/23 1717   insulin glargine-yfgn [facility]) injection 10 Units  10 Units Subcutaneous Daily Rodolph Bong, MD   10 Units at 03/24/23 5284   ipratropium-albuterol (DUONEB) 0.5-2.5 (3) MG/3ML nebulizer solution 3 mL  3 mL Nebulization Q2H PRN Rodolph Bong, MD       ipratropium-albuterol (DUONEB) 0.5-2.5 (3) MG/3ML nebulizer solution 3 mL  3 mL Nebulization BID Rodolph Bong, MD   3 mL at 03/24/23 0753   loratadine (CLARITIN) tablet 10 mg  10 mg Oral Daily Rodolph Bong, MD   10 mg at 03/24/23 1324   magic mouthwash  5 mL Oral TID PRN Jerald Kief, MD       [  START ON 03/25/2023] metroNIDAZOLE (FLAGYL) tablet 500 mg  500 mg Oral Q12H Wouk, Wilfred Curtis, MD       nystatin (MYCOSTATIN) 100000 UNIT/ML suspension 500,000 Units  5 mL Oral QID Rodolph Bong, MD   500,000 Units at 03/24/23 1448   ondansetron (ZOFRAN) tablet 4 mg  4 mg Oral Q6H PRN Alan Mulder, MD       Or   ondansetron (ZOFRAN) injection 4 mg  4 mg Intravenous Q6H PRN Alan Mulder, MD       Oral care mouth rinse  15 mL Mouth Rinse 4 times per day Jerald Kief, MD   15 mL at 03/24/23 1629   Oral care mouth rinse  15 mL Mouth Rinse PRN Jerald Kief, MD       oxyCODONE (Oxy IR/ROXICODONE) immediate release tablet 5 mg  5 mg Oral Q4H PRN Alan Mulder, MD   5 mg at 03/20/23 2218   pantoprazole (PROTONIX) EC tablet 40 mg  40 mg Oral Daily Norva Pavlov, RPH   40 mg at 03/24/23 0981   predniSONE (DELTASONE) tablet 20 mg  20 mg Oral QAC breakfast Rodolph Bong, MD   20 mg at 03/24/23 1914     Discharge Medications: Please see discharge summary for a list of discharge medications.  Relevant Imaging Results:  Relevant Lab Results:   Additional Information SSN: [id number]  Darleene Cleaver, LCSW

## 2023-03-24 NOTE — TOC Progression Note (Addendum)
Transition of Care [facility]) - Progression Note    Patient Details  Name: Kathy Thomas MRN: [id number] Date of Birth: Aug 01, 1934  Transition of Care [facility]) CM/SW Contact  Darleene Cleaver, [place] Phone Number: 03/24/2023, 4:21 PM  Clinical Narrative:     CSW received message from attending physician asking if patient can return to SNF with bipap.  CSW sent a message to French Ana from [employer],  she said that if settings are provided they can get one within 24-48 hours.  She is requesting the settings be provided as soon as possible to order the equipment.  CSW to update attending physician and bedside nurse.  TOC to continue to follow patient's progress throughout discharge planning.  Expected Discharge Plan: Skilled Nursing Facility Barriers to Discharge: No Barriers Identified  Expected Discharge Plan and Services In-house Referral: NA Discharge Planning Services: NA Post Acute Care Choice: Skilled Nursing Facility Living arrangements for the past 2 months: Skilled Nursing Facility                 DME Arranged: Bipap DME Agency: [employer] Date DME Agency Contacted: 03/19/23 Time DME Agency Contacted: 1213 Representative spoke with at DME Agency: Vaughan Basta HH Arranged:  (NA)           Social Determinants of Health (SDOH) Interventions SDOH Screenings   Food Insecurity: No Food Insecurity (03/14/2023)  Housing: Low Risk  (03/14/2023)  Transportation Needs: No Transportation Needs (03/14/2023)  Utilities: Not At Risk (03/14/2023)  Depression (PHQ2-9): Low Risk  (08/14/2020)  Tobacco Use: Medium Risk (03/14/2023)    Readmission Risk Interventions     No data to display

## 2023-03-24 NOTE — Progress Notes (Signed)
NAME:  Kathy Thomas, MRN:  [id number], DOB:  May 07, 1935, LOS: 10 ADMISSION DATE:  03/14/2023, CONSULTATION DATE:  03/16/2023 REFERRING MD:  Rhona Leavens - TRH CHIEF COMPLAINT: Cavitary pneumonia   History of Present Illness:  88 year old woman who presented to [facility] from [facility] 10/20 for hypoxia, decreased responsiveness. PMHx significant for HTN, HLD, OSA, T2DM, GERD, anemia, dementia, and stroke.   Patient resides in SNF where she was started on ertapenem for pneumonia after developing hypoxia and shortness of breath. She did not have immediate improvement. On 10/20AM she was noted to be unresponsive with oxygen saturations in the 60s.  This was ultimately felt to be secondary to hypoglycemia and her responsiveness did somewhat improve with treatment.  She was brought to the emergency department for ongoing evaluation.  Workup for hypoxia included a CT scan of the chest which demonstrated cavitary lesion in the right middle lobe.  There was concern for PE versus atypical pneumonia.  VQ scan was done and was concerning for pulmonary embolism.  She was initiated on treatment with empiric antibiotics as well as systemic anticoagulation.  PCCM was consulted for cavitary pneumonia.  Pertinent Medical History:   Past Medical History:  Diagnosis Date   Acute sinusitis, unspecified 05/28/2008   ANEMIA-IRON DEFICIENCY 10/26/2008   ANXIETY 10/06/2007   CHOLELITHIASIS 10/06/2007   COLONIC POLYPS, HX OF 01/26/2007   DIABETES MELLITUS, TYPE II 10/06/2007   DIVERTICULOSIS, COLON 01/26/2007   ECCHYMOSES, SPONTANEOUS 06/04/2010   GERD 01/26/2007   Heart murmur    slight heart murmur   HERNIATED DISC 01/26/2007   HYPERLIPIDEMIA 10/06/2007   HYPERTENSION 01/26/2007   LOW BACK PAIN 01/29/2007   MENOPAUSAL DISORDER 10/26/2008   OSA (obstructive sleep apnea) 03/22/2023   OSTEOPOROSIS 06/04/2010   SHOULDER PAIN, LEFT 10/06/2007   Stroke (HCC)    TIA's   Significant Hospital Events: Including procedures, antibiotic start and stop  dates in addition to other pertinent events   10/20 Admit for pneumonia 10/21 VQ + for PE 10/22 PCCM consult for cavitary pneumonia 10/23 Transferred to SDU for BiPAP in the setting of lethargy and hypercarbia 10/24 Tolerated BiPAP most of the night. More wakeful this morning 10/25 No acute events overnight, utilize BiPAP  Interim History/Subjective:  No significant events overnight Tolerated BiPAP overnight without desaturations This AM, up in bed with daughter at bedside Consuming dysphagia 2 diet without issues  CXR stable to slightly improved Clinically improving with less O2 needs, improving mentation  Objective:  Blood pressure (!) 136/48, pulse (!) 42, temperature 98.6 F (37 C), temperature source Oral, resp. rate 20, height 5\' 1"  (1.549 m), weight 122.7 kg, SpO2 96%.    FiO2 (%):  [40 %] 40 %   Intake/Output Summary (Last 24 hours) at 03/24/2023 0936 Last data filed at 03/24/2023 0703 Gross per 24 hour  Intake 291.88 ml  Output 3425 ml  Net -3133.12 ml   Filed Weights   03/14/23 1803 03/17/23 1800 03/24/23 0600  Weight: 116.6 kg 119.8 kg 122.7 kg   Physical Examination: General: Chronically ill-appearing elderly woman in NAD. Pleasantly confused, sitting up in bed eating breakfast. HEENT: [place]/AT, anicteric sclera, PERRL, slightly dry mucous membranes. Neuro: Awake, oriented x 1-2. Responds to verbal stimuli. Following commands intermittently. Moves all 4 extremities spontaneously. Generalized weakness.  CV: Mildly bradycardic, regular rhythm, no m/g/r. PULM: Breathing even and unlabored on 2LNC. Lung fields diminished bilaterally, R > L. GI: Soft, nontender, nondistended. Normoactive bowel sounds. Extremities: Bilateral symmetric 1-2+ pitting LE edema noted. Skin:  Warm/dry, no rashes. Scattered ecchymosis of bilateral hands.  Resolved Hospital Problem List:     Assessment & Plan:   Cavitary pneumonia likely secondary to aspiration Less likely due to pulmonary  infarct, but she does have PE.  - Prolonged course of antibiotics for treatment of anaerobes (4-week course) - Ok to transition to PO medications as tolerated - Dysphagia 2 diet per SLP recs - Repeat CT Chest after antibiotic course complete - Aspiration precautions - Prednisone taper to end 10/31  Acute on chronic respiratory failure with hypoxia and hypercarbia secondary to what is likely undiagnosed OSA/OHS. Staff has observed apneic periods while sleeping. She has been doing well with at bedtime BiPAP. - Supplemental O2 support for > 90% - BiPAP QHS + PRN - Assess diuresis needs daily - Bronchodilators (budesonide, DuoNebs) - Pulmonary hygiene - Will qualify for outpatient NIV based on chronic hypercarbia; d/w Clapps if this can be safely utilized and monitored; patient is known to "pocket" food and would require this to be cleared to safely use NIV  Pulmonary embolism DVT left popliteal - AC per primary team (Eliquis)  Goals of care: Patient reportedly has DNR documents somewhere, but family has been unable to produce them. They wish for her to remain full code for now.   Per primary: AKI HTN Dementia Hypoglycemia  Best Practice: (right click and "Reselect all SmartList Selections" daily)   Per Primary Team  Signature:    Faythe Ghee [facility] 03/24/23 9:36 AM  Please see [email or web address] for pager details.  From 7A-7P if no response, please call [phone] After hours, please call ELink [phone]

## 2023-03-24 NOTE — Progress Notes (Signed)
PHARMACY - ANTICOAGULATION CONSULT NOTE  Pharmacy Consult for apixaban Indication:  PE, DVT  Allergies  Allergen Reactions   Codeine Hives   Penicillins     REACTION: rash   Morphine Rash    REACTION: pt unsure of reaction    Patient Measurements: Height: 5\' 1"  (154.9 cm) Weight: 122.7 kg (270 lb 8.1 oz) IBW/kg (Calculated) : 47.8  Vital Signs: Temp: 98.6 F (37 C) (10/30 0758) Temp Source: Oral (10/30 0758) BP: 136/48 (10/30 0400) Pulse Rate: 42 (10/30 0400)  Labs: Recent Labs    03/22/23 0311 03/23/23 0313 03/24/23 0320  HGB 11.2* 11.3* 11.3*  HCT 42.4 40.7 40.3  PLT 189 184 183  CREATININE 0.77 0.62 0.82    Estimated Creatinine Clearance: 61.6 mL/min (by C-G formula based on SCr of 0.82 mg/dL).   Medical History: Past Medical History:  Diagnosis Date   Acute sinusitis, unspecified 05/28/2008   ANEMIA-IRON DEFICIENCY 10/26/2008   ANXIETY 10/06/2007   CHOLELITHIASIS 10/06/2007   COLONIC POLYPS, HX OF 01/26/2007   DIABETES MELLITUS, TYPE II 10/06/2007   DIVERTICULOSIS, COLON 01/26/2007   ECCHYMOSES, SPONTANEOUS 06/04/2010   GERD 01/26/2007   Heart murmur    slight heart murmur   HERNIATED DISC 01/26/2007   HYPERLIPIDEMIA 10/06/2007   HYPERTENSION 01/26/2007   LOW BACK PAIN 01/29/2007   MENOPAUSAL DISORDER 10/26/2008   OSA (obstructive sleep apnea) 03/22/2023   OSTEOPOROSIS 06/04/2010   SHOULDER PAIN, LEFT 10/06/2007   Stroke (HCC)    TIA's    Medications: No anticoagulants PTA  Assessment: Pt is an 80 yoF who is currently on apixaban for acute PE/DVT. Pharmacy consulted to dose.   Today, 03/24/23 CBC: Hgb low but stable, Plt WNL SCr WNL and stable  Plan:  Continue apixaban 10 mg PO BID x 7 days followed by apixaban 5 mg PO BID Education completed 10/29  Pharmacy to sign off.   Cindi Carbon, PharmD 03/24/2023,9:51 AM

## 2023-03-24 NOTE — Progress Notes (Signed)
   03/23/23 2309  BiPAP/CPAP/SIPAP  BiPAP/CPAP/SIPAP Pt Type Adult  BiPAP/CPAP/SIPAP V60  Mask Type Full face mask  Mask Size Large  Set Rate 14 breaths/min  Respiratory Rate 25 breaths/min  IPAP 18 cmH20  EPAP 10 cmH2O  FiO2 (%) 40 %  Minute Ventilation 11.7  Leak 11  Peak Inspiratory Pressure (PIP) 18  Tidal Volume (Vt) 471  Patient Home Equipment No  Auto Titrate No  Press High Alarm 30 cmH2O  Press Low Alarm 5 cmH2O  CPAP/SIPAP surface wiped down Yes  BiPAP/CPAP /SiPAP Vitals  Pulse Rate (!) 54  Resp (!) 25  SpO2 97 %  Bilateral Breath Sounds Diminished  MEWS Score/Color  MEWS Score 1  MEWS Score Color Green

## 2023-03-25 DIAGNOSIS — J189 Pneumonia, unspecified organism: Secondary | ICD-10-CM | POA: Diagnosis not present

## 2023-03-25 DIAGNOSIS — J984 Other disorders of lung: Secondary | ICD-10-CM | POA: Diagnosis not present

## 2023-03-25 LAB — GLUCOSE, CAPILLARY
Glucose-Capillary: 126 mg/dL — ABNORMAL HIGH (ref 70–99)
Glucose-Capillary: 143 mg/dL — ABNORMAL HIGH (ref 70–99)
Glucose-Capillary: 272 mg/dL — ABNORMAL HIGH (ref 70–99)
Glucose-Capillary: 297 mg/dL — ABNORMAL HIGH (ref 70–99)

## 2023-03-25 LAB — BASIC METABOLIC PANEL
Anion gap: 7 (ref 5–15)
BUN: 44 mg/dL — ABNORMAL HIGH (ref 8–23)
CO2: 31 mmol/L (ref 22–32)
Calcium: 8.6 mg/dL — ABNORMAL LOW (ref 8.9–10.3)
Chloride: 102 mmol/L (ref 98–111)
Creatinine, Ser: 0.87 mg/dL (ref 0.44–1.00)
GFR, Estimated: >60 mL/min (ref >60)
Glucose, Bld: 179 mg/dL — ABNORMAL HIGH (ref 70–99)
Potassium: 4.1 mmol/L (ref 3.5–5.1)
Sodium: 140 mmol/L (ref 135–145)

## 2023-03-25 MED ORDER — FUROSEMIDE 10 MG/ML IJ SOLN
40.0000 mg | Freq: Every day | INTRAMUSCULAR | Status: AC
  Administered 2023-03-25 – 2023-03-27 (×3): 40 mg via INTRAVENOUS
  Filled 2023-03-25 (×3): qty 4

## 2023-03-25 MED ORDER — POLYETHYLENE GLYCOL 3350 17 G PO PACK
17.0000 g | PACK | Freq: Two times a day (BID) | ORAL | Status: DC
  Administered 2023-03-25 – 2023-04-01 (×7): 17 g via ORAL
  Filled 2023-03-25 (×10): qty 1

## 2023-03-25 MED ORDER — BISACODYL 10 MG RE SUPP: 10.0000 mg | Freq: Every day | RECTAL | Status: DC | PRN

## 2023-03-25 MED ORDER — BISACODYL 5 MG PO TBEC: 10.0000 mg | DELAYED_RELEASE_TABLET | Freq: Every day | ORAL | Status: DC | PRN

## 2023-03-25 NOTE — Progress Notes (Signed)
   03/25/23 2308  BiPAP/CPAP/SIPAP  BiPAP/CPAP/SIPAP Pt Type Adult  BiPAP/CPAP/SIPAP V60  Mask Type Full face mask  Mask Size Large  Set Rate 14 breaths/min  Respiratory Rate 28 breaths/min  IPAP 16 cmH20  EPAP 8 cmH2O  FiO2 (%) 40 %  Minute Ventilation 11.4  Leak 9  Peak Inspiratory Pressure (PIP) 16  Tidal Volume (Vt) 407  Patient Home Equipment No  Auto Titrate No  Press High Alarm 30 cmH2O  Press Low Alarm 5 cmH2O

## 2023-03-25 NOTE — Progress Notes (Signed)
Occupational Therapy Treatment Patient Details Name: Kathy Thomas MRN: [id number] DOB: 18-Feb-1935 Today's Date: 03/25/2023   History of present illness 88 yr old female with medical history significant of dementia, hypertension who presented to the emergency department due to shortness of breath.  Patient was found to be hypoxic 3 days ago and underwent chest x-ray which showed concern for community-acquired pneumonia.  On EMS arrival she was found to be hypoglycemic.  She was brought to the ER where she was hypoxic requiring up to 6 L nasal cannula. Pt was admitted for concerns of  PNA   OT comments  Pt was seen for bed mobility and therapeutic exercises at bed level, for strengthening needed to facilitate improved ADL performance. She was instructed on B UE and B LE ROM and exercises, for which she performed 10 reps and 1 set of such exercises as bicep curls, ankle pumps, knee flexion/extension, and shoulder flexion. She often required active assist for B LE, as well as occasional verbal and demonstrational cues for correct form/technique. She further required total assist for rolling left & right. Overall, she presented with good effort and participation. Continue OT plan of care. SNF recommended at discharge.      If plan is discharge home, recommend the following:  Direct supervision/assist for medications management;Two people to help with walking and/or transfers;A lot of help with bathing/dressing/bathroom   Equipment Recommendations  None recommended by OT    Recommendations for Other Services      Precautions / Restrictions Precautions Precautions: Fall Restrictions Weight Bearing Restrictions: No       Mobility Bed Mobility Overal bed mobility: Needs Assistance Bed Mobility: Rolling Rolling: +2 for physical assistance, Total assist, Used rails         General bed mobility comments: Pt was instructed on rolling left and right, for which she required instruction on  bending each knee, weight shifting, reaching for bed rail and pulling on bed rail           ADL either performed or assessed with clinical judgement   ADL   Eating/Feeding: Set up;Bed level Eating/Feeding Details (indicate cue type and reason): The pt grasped a cup and brought it to her mouth for drinking on 2 instances while at bed level.                 Lower Body Dressing: Total assistance;Bed level Lower Body Dressing Details (indicate cue type and reason): She required total assist to donn her socks at bed level.                      Cognition Arousal: Alert Behavior During Therapy: WFL for tasks assessed/performed Overall Cognitive Status: History of cognitive impairments - at baseline          General Comments: impaired memory, able to follow 1 step commands with occasional repetition                   Pertinent Vitals/ Pain       Pain Assessment Pain Assessment: Faces Pain Score: 3  Pain Location: RLE with ROM Pain Intervention(s): Limited activity within patient's tolerance, Monitored during session         Frequency  Min 1X/week        Progress Toward Goals  OT Goals(current goals can now be found in the care plan section)     Acute Rehab OT Goals OT Goal Formulation: With patient/family Time For Goal Achievement: 04/02/23 Potential to  Achieve Goals: Fair  Plan         AM-PAC OT "6 Clicks" Daily Activity     Outcome Measure   Help from another person eating meals?: A Little Help from another person taking care of personal grooming?: A Little Help from another person toileting, which includes using toliet, bedpan, or urinal?: Total Help from another person bathing (including washing, rinsing, drying)?: A Lot Help from another person to put on and taking off regular upper body clothing?: A Lot Help from another person to put on and taking off regular lower body clothing?: Total 6 Click Score: 12    End of Session  Equipment Utilized During Treatment: Oxygen  OT Visit Diagnosis: Muscle weakness (generalized) (M62.81)   Activity Tolerance Other (comment) (Fair tolerance)   Patient Left in bed;with call bell/phone within reach;with bed alarm set   Nurse Communication Mobility status        Time: 1610-9604 OT Time Calculation (min): 15 min  Charges: OT General Charges $OT Visit: 1 Visit OT Treatments $Therapeutic Activity: 8-22 mins     Reuben Likes, OTR/L 03/25/2023, 4:22 PM

## 2023-03-25 NOTE — TOC Progression Note (Addendum)
Transition of Care [facility]) - Progression Note    Patient Details  Name: Kathy Thomas MRN: [id number] Date of Birth: [birth year]  Transition of Care [facility]) CM/SW Contact  Kathy Thomas Phone Number: 03/25/2023, 10:42 AM  Clinical Narrative:     Attempted to contact [facility] LTC admissions worker.  CSW had to leave a message awaiting for call back.  11:40am CSW received call back from LTC admissions worker at [facility], who reported that there are not any LTC Medicaid beds available.  CSW contacted patient's daughter Kathy Thomas and updated her.  CSW provided patient's daughter with the other bed offers available for patient.  Bed offers:  [facility] Preferred SNF  [address], [place] [address]  [facility] SN[facility]  [address], [place] [address]  [place], [place] Preferred SNF  [address] [place] [address]  [facility]  [address] [place], [place] [address]  [facility]  [address], [place] [address]  [facility] Preferred SNF  [address] [place] [address]  [facility]  [address], [place] [address]  [facility] Preferred SNF  [address] [place] [address]  [facility] SN[facility]  [address], [place] [address]   CSW updated patient's daughter and asked her to review the facilities to see which ones she would like patient to go to.  CSW also provided star rating for each facility.  CSW informed patient's daughter that [facility] has ordered the BIPAP and it will be available if patient returns to SNF.  CSW to continue to follow patient's progress throughout discharge planning.  2:30pm CSW spoke to patient's daughter Kathy Thomas to discuss SNF placement options.  Per patient's daughter, patient's insurance will be switching tomorrow from [facility] to [facility], member  id [id number].  Patient's daughter is reviewing facilities for patient to transfer to.  She will be touring [employer] in [place] around 12pm.  Tresa Endo at [facility] is aware and expecting patient's daughter. TOC explained to patient's daughter that bed availability does change quickly, if she finds a facility, she needs to let [facility] person know immediately to get the bed reserved.  Patient's daughter expressed understanding.  TOC to continue to follow patient's progress throughout discharge planning.   Expected Discharge Plan: Skilled Nursing Facility Barriers to Discharge: No Barriers Identified  Expected Discharge Plan and Services In-house Referral: NA Discharge Planning Services: NA Post Acute Care Choice: Skilled Nursing Facility Living arrangements for the past 2 months: Skilled Nursing Facility                 DME Arranged: Bipap DME Agency: [employer] Date DME Agency Contacted: 03/19/23 Time DME Agency Contacted: 1213 Representative spoke with at DME Agency: Vaughan Basta HH Arranged:  (NA)           Social Determinants of Health (SDOH) Interventions SDOH Screenings   Food Insecurity: No Food Insecurity (03/14/2023)  Housing: Low Risk  (03/14/2023)  Transportation Needs: No Transportation Needs (03/14/2023)  Utilities: Not At Risk (03/14/2023)  Depression (PHQ2-9): Low Risk  (08/14/2020)  Tobacco Use: Medium Risk (03/14/2023)    Readmission Risk Interventions     No data to display

## 2023-03-25 NOTE — Plan of Care (Signed)
  Problem: Education: Goal: Knowledge of General Education information will improve Description: Including pain rating scale, medication(s)/side effects and non-pharmacologic comfort measures Outcome: Progressing   Problem: Health Behavior/Discharge Planning: Goal: Ability to manage health-related needs will improve Outcome: Progressing   Problem: Clinical Measurements: Goal: Ability to maintain clinical measurements within normal limits will improve Outcome: Progressing Goal: Will remain free from infection Outcome: Progressing Goal: Diagnostic test results will improve Outcome: Progressing Goal: Respiratory complications will improve Outcome: Progressing Goal: Cardiovascular complication will be avoided Outcome: Progressing   Problem: Activity: Goal: Risk for activity intolerance will decrease Outcome: Progressing   Problem: Nutrition: Goal: Adequate nutrition will be maintained Outcome: Progressing   Problem: Coping: Goal: Level of anxiety will decrease Outcome: Progressing   Problem: Elimination: Goal: Will not experience complications related to bowel motility Outcome: Progressing Goal: Will not experience complications related to urinary retention Outcome: Progressing   Problem: Pain Managment: Goal: General experience of comfort will improve Outcome: Progressing   Problem: Safety: Goal: Ability to remain free from injury will improve Outcome: Progressing   Problem: Skin Integrity: Goal: Risk for impaired skin integrity will decrease Outcome: Progressing   Problem: Clinical Measurements: Goal: Ability to maintain a body temperature in the normal range will improve Outcome: Progressing   Problem: Respiratory: Goal: Ability to maintain adequate ventilation will improve Outcome: Progressing   Problem: Education: Goal: Ability to describe self-care measures that may prevent or decrease complications (Diabetes Survival Skills Education) will  improve Outcome: Progressing Goal: Individualized Educational Video(s) Outcome: Progressing   Problem: Coping: Goal: Ability to adjust to condition or change in health will improve Outcome: Progressing   Problem: Fluid Volume: Goal: Ability to maintain a balanced intake and output will improve Outcome: Progressing   Problem: Health Behavior/Discharge Planning: Goal: Ability to identify and utilize available resources and services will improve Outcome: Progressing Goal: Ability to manage health-related needs will improve Outcome: Progressing   Problem: Metabolic: Goal: Ability to maintain appropriate glucose levels will improve Outcome: Progressing   Problem: Nutritional: Goal: Maintenance of adequate nutrition will improve Outcome: Progressing Goal: Progress toward achieving an optimal weight will improve Outcome: Progressing   Problem: Skin Integrity: Goal: Risk for impaired skin integrity will decrease Outcome: Progressing   Problem: Tissue Perfusion: Goal: Adequacy of tissue perfusion will improve Outcome: Progressing

## 2023-03-25 NOTE — Progress Notes (Signed)
PROGRESS NOTE    Kathy Thomas  ZOX:[id number] DOB: 1935/03/15 DOA: 03/14/2023 PCP: Marden Noble, MD (Inactive)   Chief Complaint  Patient presents with   Hypoglycemia   Shortness of Breath    Brief Narrative:  88 y.o. female with medical history significant of dementia, hypertension who presented to the emergency department due to shortness of breath.  Patient was found to be hypoxic 3 days ago and underwent chest x-ray which showed concern for community-acquired pneumonia.  She was started on ertapenem and received 3 doses.  By the nursing facility she developed tachypnea and shortness of breath so EMS was called to bring her to the emergency department further assessment.  On EMS arrival she was found to be hypoglycemic.  She was brought to the ER where she was hypoxic requiring up to 6 L nasal cannula. Pt was admitted for concerns of cavitary PNA    Assessment & Plan:   Principal Problem:   Cavitary pneumonia Active Problems:   Pulmonary embolism (HCC)   DVT (deep venous thrombosis) (HCC)   DM type 2 (diabetes mellitus, type 2) (HCC)   HTN (hypertension)   Dementia without behavioral disturbance (HCC)   Diabetes mellitus without complication (HCC)   Pressure injury of skin   Hyperkalemia   AKI (acute kidney injury) (HCC)   Oral thrush   Hypernatremia   Dysphagia   OSA (obstructive sleep apnea)   #1 cavitary pneumonia -Patient noted to have presented with altered mental status, tachypnea, worsening shortness of breath and noted to have been hypoxic 3 days prior to admission underwent chest x-ray concerning for community-acquired pneumonia. -CT chest done with findings notable for a large masslike consolidation of the RML with central groundglass, cavitation, peripheral ring of dense consolidation concerning for pulmonary infarct versus atypical cavitary pneumonia.  Small right pleural effusion noted. -Patient noted to have completed ertapenem prior to  admission. -Patient was placed on BiPAP 03/17/2023 with clinical improvement currently on 4 L nasal cannula with sats of 96-98%. -Patient seen in consultation by pulmonary who assessed the patient and recommending continuation of antibiotics of IV Rocephin and Flagyl and recommended total of 4   weeks of antibiotics await reimaging in the outpatient setting. PCCM is ok switching from iv abx (ceftriaxone/flagyl) to orals today. Will start cefdinir/flagyl (pcn allergy), after discussion w/ rph regarding abx -Patient assessed by SLP.  2.  PE/left popliteal DVT -Noted on V/Q scan with findings confirming PE. -Lower extremity Dopplers with left lower extremity DVT. -2D echo with EF of 55 to 60%, mild concentric LVH, grade 1 DD, full study not completed as patient noted to have refused exam. -Was on IV heparin was transitioned to full dose Lovenox and subsequently transition to Eliquis on 03/21/2023. -Per pulmonary likely need lifelong DOAC as patient at baseline with a sedentary lifestyle..  3.  Acute metabolic encephalopathy/acute respiratory failure with hypercarbia/probable undiagnosed OSA/OHS -Likely multifactorial secondary to hypercarbia in the setting of acute illness of cavitary pneumonia, with prior history of tobacco use. -Patient with no focal neurological deficits however daughter was concerned due to patient's family history and current mental status concern for possible CVA. -CT head done on admission negative for any acute abnormalities. -Repeat head CT negative for any acute abnormalities.. -Ammonia levels slightly elevated at 41 and trended down with lactulose. -Patient with clinical improvement after being placed on BiPAP -IV Solu-Medrol has been transitioned to oral prednisone taper.  Continue PPI.  -BiPAP as needed and nightly. -Patient likely with probable undiagnosed  OSA/OHS. -Lasix ordered per PCCM for trial. -Per PCCM patient will qualify for outpatient NIV by her chronic  bicarb elevation. -Will need to determine if this can safely be used at her SNF as per family patient is known to pocket food and has dysphagia. -Pulmonary following and feel patient would likely need NIV at discharge. TOC says her facility can support that but needs to be ordered and then takes 24-48 hours. TOC obtaining those bipap settings today  4.  Hyperkalemia, Resolved  Treated w/ lokelma, resolved today - monitor  5.  AKI, Resolved  -Likely secondary to prerenal azotemia in the setting of ARB, diuretics.  Patient also noted to be on Jardiance prior to admission. -Renal function improved.   -Continue to hold ARB   6.  Oral thrush -Clinical improvement.   -Nystatin swish and swallow.   7.  Hypertension -BP has improved.   -ARB,on hold. -IV hydralazine as needed. -Patient with some bradycardia early on and during the hospitalization IV Lopressor was initially held.  -Patient with some bradycardia while sleeping but improved with arousal.  8.  Cognitive impairment -Aricept.  9.  diabetes mellitus type 2 Glucose mild elevation, is on steroids - continue basal/ssi insulin   10.  Bilateral upper extremity swelling -Likely secondary to third spacing patient with hypoalbuminemia. -Status post IV albumin, Lasix 40 mg IV x 1 with significant improvement with upper extremity swelling.   10/31 started Lasix 40 mg IV daily x 3 days  11.  Hypernatremia -Resolved with D5W and half-normal saline.   -Follow.  12.  Dysphagia -Patient initially was on dysphagia 1 diet, difficulty swallowing initially on presentation was likely due to patient's lethargy. -Patient more alert, reassessed by SLP and diet advanced to a dysphagia 3 diet. -Daughter does state patient occasionally pockets food. -SLP following and appreciate input and recommendations.  13.  Sinus bradycardia -Patient noted bradycardic in the mid to high 40s to 50s while on BiPAP and sleeping. -Heart rate improves with  awakening/arousal. -Check a EKG. -Follow.   14.  Pressure injury buttock stage II, POA Pressure Injury 03/14/23 Buttocks Stage 2 -  Partial thickness loss of dermis presenting as a shallow open injury with a red, pink wound bed without slough. (Active)  03/14/23 1830  Location: Buttocks  Location Orientation:   Staging: Stage 2 -  Partial thickness loss of dermis presenting as a shallow open injury with a red, pink wound bed without slough.  Wound Description (Comments):   Present on Admission: Yes       DVT prophylaxis: Eliquis Code Status: Full Family Communication: Updated daughter at bedside 10/30 Disposition: Remain in SDU.  Likely back to SNF when medically stable.    Status is: Inpatient Remains inpatient appropriate because: Severity of illness   Consultants:  PCCM: Dr. Isaiah Serge 03/16/2023  Procedures:  CT head 03/14/2023, 03/17/2023 CT chest 03/14/2023 Left upper extremity Dopplers 03/16/2023 Lower extremity Dopplers 03/16/2023 2D echo 03/15/2023  Antimicrobials:  Anti-infectives (From admission, onward)    Start     Dose/Rate Route Frequency Ordered Stop   03/25/23 0500  metroNIDAZOLE (FLAGYL) tablet 500 mg        500 mg Oral Every 12 hours 03/24/23 1733     03/24/23 2200  clindamycin (CLEOCIN) capsule 300 mg  Status:  Discontinued        300 mg Oral Every 8 hours 03/24/23 1703 03/24/23 1730   03/24/23 2200  cefdinir (OMNICEF) capsule 300 mg  300 mg Oral Every 12 hours 03/24/23 1733     03/16/23 2200  cefTRIAXone (ROCEPHIN) 2 g in sodium chloride 0.9 % 100 mL IVPB  Status:  Discontinued        2 g 200 mL/hr over 30 Minutes Intravenous Every 24 hours 03/16/23 1322 03/24/23 1703   03/16/23 1400  metroNIDAZOLE (FLAGYL) IVPB 500 mg  Status:  Discontinued        500 mg 100 mL/hr over 60 Minutes Intravenous Every 12 hours 03/16/23 1322 03/24/23 1703   03/16/23 0915  ceFEPIme (MAXIPIME) 2 g in sodium chloride 0.9 % 100 mL IVPB  Status:  Discontinued        2  g 200 mL/hr over 30 Minutes Intravenous Every 12 hours 03/16/23 0904 03/16/23 1322   03/15/23 1800  ceFEPIme (MAXIPIME) 2 g in sodium chloride 0.9 % 100 mL IVPB  Status:  Discontinued        2 g 200 mL/hr over 30 Minutes Intravenous Every 24 hours 03/14/23 2010 03/16/23 0904   03/14/23 1954  vancomycin variable dose per unstable renal function (pharmacist dosing)  Status:  Discontinued         Does not apply See admin instructions 03/14/23 1954 03/15/23 0744   03/14/23 1930  vancomycin (VANCOCIN) IVPB 1000 mg/200 mL premix        1,000 mg 200 mL/hr over 60 Minutes Intravenous  Once 03/14/23 1842 03/15/23 0700   03/14/23 1700  vancomycin (VANCOCIN) IVPB 1000 mg/200 mL premix        1,000 mg 200 mL/hr over 60 Minutes Intravenous  Once 03/14/23 1652 03/15/23 0700   03/14/23 1700  ceFEPIme (MAXIPIME) 2 g in sodium chloride 0.9 % 100 mL IVPB        2 g 200 mL/hr over 30 Minutes Intravenous  Once 03/14/23 1652 03/15/23 0700         Subjective: Overnight patient had hypoxia, low saturation, so currently she is on faltered oxygen.  Patient is AO x 1, unable to offer any complaints, pleasantly confused.  Objective: Vitals:   03/25/23 0530 03/25/23 0600 03/25/23 0732 03/25/23 1110  BP:      Pulse: (!) 49 (!) 49    Resp: 16 17    Temp:   97.7 F (36.5 C) 97.9 F (36.6 C)  TempSrc:   Axillary Oral  SpO2: 91% 99%    Weight:      Height:        Intake/Output Summary (Last 24 hours) at 03/25/2023 1355 Last data filed at 03/25/2023 1110 Gross per 24 hour  Intake 220 ml  Output 750 ml  Net -530 ml   Filed Weights   03/14/23 1803 03/17/23 1800 03/24/23 0600  Weight: 116.6 kg 119.8 kg 122.7 kg    Examination:  General exam: Awake alert. Chronically ill appearing Respiratory system: Equal air entry bilaterally, mild bibasilar crackles, no wheezing appreciated Cardiovascular system: Bradycardia.  No JVD.  No murmurs rubs or gallops.   Gastrointestinal system: Abdomen is obese,  soft, nontender, nondistended,  Central nervous system: Alert and oriented to self, moving all 4 equally Extremities: warm, 2-3+ edema LEs Skin: Ecchymosis on left hand and right hand.  Psychiatry: calm, pleasantly confused    Data Reviewed: I have personally reviewed following labs and imaging studies  CBC: Recent Labs  Lab 03/20/23 0307 03/21/23 0303 03/22/23 0311 03/23/23 0313 03/24/23 0320  WBC 8.3 8.0 7.4 8.0 8.4  NEUTROABS  --   --  6.1  --  6.6  HGB 11.3* 12.4 11.2* 11.3* 11.3*  HCT 42.1 46.3* 42.4 40.7 40.3  MCV 94.4 93.7 95.9 92.1 90.2  PLT 232 231 189 184 183    Basic Metabolic Panel: Recent Labs  Lab 03/19/23 0318 03/20/23 0307 03/21/23 0303 03/21/23 1148 03/22/23 0311 03/23/23 0313 03/24/23 0320 03/25/23 0321  NA 145 153* 146*  --  141 141 139 140  K 4.7 4.7 5.4* 4.7 5.3* 5.4* 4.3 4.1  CL 110 117* 111  --  106 104 99 102  CO2 28 30 28   --  29 28 31 31   GLUCOSE 281* 193* 261*  --  239* 236* 197* 179*  BUN 39* 44* 45*  --  43* 43* 46* 44*  CREATININE 0.95 0.82 0.87  --  0.77 0.62 0.82 0.87  CALCIUM 8.1* 8.4* 8.3*  --  8.7* 8.9 8.7* 8.6*  MG 2.5*  --   --   --   --   --  2.0  --   PHOS  --  2.1* 3.0  --  3.3 3.2 3.5  --     GFR: Estimated Creatinine Clearance: 58.1 mL/min (by C-G formula based on SCr of 0.87 mg/dL).  Liver Function Tests: Recent Labs  Lab 03/20/23 0307 03/22/23 0311 03/23/23 0313 03/24/23 0320  ALBUMIN 2.5* 3.2* 2.8* 2.7*    CBG: Recent Labs  Lab 03/24/23 1220 03/24/23 1637 03/24/23 2232 03/25/23 0732 03/25/23 1107  GLUCAP 139* 250* 203* 126* 143*     Recent Results (from the past 240 hour(s))  Urine Culture (for pregnant, neutropenic or urologic patients or patients with an indwelling urinary catheter)     Status: Abnormal   Collection Time: 03/17/23  3:18 PM   Specimen: Urine, Catheterized  Result Value Ref Range Status   Specimen Description   Final    URINE, CATHETERIZED Performed at [facility], [address], [place], [place] [address]    Special Requests   Final    NONE Performed at [facility], [address], [place], [place] [address]    Culture 30,000 COLONIES/mL YEAST (A)  Final   Report Status 03/18/2023 FINAL  Final         Radiology Studies: DG CHEST PORT 1 VIEW  Result Date: 03/24/2023 CLINICAL DATA:  Pneumonia. EXAM: PORTABLE CHEST 1 VIEW COMPARISON:  March 14, 2023. FINDINGS: Stable cardiomegaly. Bibasilar opacities are noted concerning for pneumonia or atelectasis. Probable small right pleural effusion. Bony thorax is unremarkable. IMPRESSION: Bibasilar atelectasis or infiltrates are noted with small right pleural effusion. Electronically Signed   By: Lupita Raider M.D.   On: 03/24/2023 13:32        Scheduled Meds:  amLODipine  5 mg Oral Daily   apixaban  10 mg Oral BID   Followed by   Melene Muller ON 03/28/2023] apixaban  5 mg Oral BID   budesonide (PULMICORT) nebulizer solution  0.5 mg Nebulization BID   cefdinir  300 mg Oral Q12H   Chlorhexidine Gluconate Cloth  6 each Topical Daily   cyanocobalamin  1,000 mcg Subcutaneous Q1200   donepezil  10 mg Oral QHS   feeding supplement (GLUCERNA SHAKE)  237 mL Oral BID BM   fluticasone  2 spray Each Nare Daily   insulin aspart  0-9 Units Subcutaneous TID WC   insulin glargine-yfgn  10 Units Subcutaneous Daily   ipratropium-albuterol  3 mL Nebulization BID   loratadine  10 mg Oral Daily   metroNIDAZOLE  500 mg  Oral Q12H   nystatin  5 mL Oral QID   mouth rinse  15 mL Mouth Rinse 4 times per day   pantoprazole  40 mg Oral Daily   Continuous Infusions:     LOS: 11 days    Time spent: 40 minutes    Gillis Santa, MD [facility]   To contact the attending provider between 7A-7P or the covering provider during after hours 7P-7A, please log into the web site [email or web address] and access using universal [place] password for that web site. If you do not have the  password, please call the hospital operator.  03/25/2023, 1:55 PM

## 2023-03-25 NOTE — Progress Notes (Signed)
Pt seen during rounds and found off bipap.  Per RN, pt wanted bipap mask off around 0200.  Bipap removed per pt request and placed on 4L [place].  Pt is currently awake, alert and does not want to go back on bipap at this time.  HR53, rr23, spo2 99% on 4l [place].  Bipap remains in room on standby as needed.

## 2023-03-25 NOTE — Progress Notes (Signed)
NAME:  Kathy Thomas, MRN:  [id number], DOB:  [birth year], LOS: 11 ADMISSION DATE:  03/14/2023, CONSULTATION DATE:  03/16/2023 REFERRING MD:  Rhona Leavens - TRH CHIEF COMPLAINT: Cavitary pneumonia   History of Present Illness:  88 year old woman who presented to [facility] from [facility] [place] [facility] 10/20 for hypoxia, decreased responsiveness. PMHx significant for HTN, HLD, OSA, T2DM, GERD, anemia, dementia, and stroke.   Patient resides in SNF where she was started on ertapenem for pneumonia after developing hypoxia and shortness of breath. She did not have immediate improvement. On 10/20AM she was noted to be unresponsive with oxygen saturations in the 60s.  This was ultimately felt to be secondary to hypoglycemia and her responsiveness did somewhat improve with treatment.  She was brought to the emergency department for ongoing evaluation.  Workup for hypoxia included a CT scan of the chest which demonstrated cavitary lesion in the right middle lobe.  There was concern for PE versus atypical pneumonia.  VQ scan was done and was concerning for pulmonary embolism.  She was initiated on treatment with empiric antibiotics as well as systemic anticoagulation.  PCCM was consulted for cavitary pneumonia.  Pertinent Medical History:   Past Medical History:  Diagnosis Date   Acute sinusitis, unspecified 05/28/2008   ANEMIA-IRON DEFICIENCY 10/26/2008   ANXIETY 10/06/2007   CHOLELITHIASIS 10/06/2007   COLONIC POLYPS, HX OF 01/26/2007   DIABETES MELLITUS, TYPE II 10/06/2007   DIVERTICULOSIS, COLON 01/26/2007   ECCHYMOSES, SPONTANEOUS 06/04/2010   GERD 01/26/2007   Heart murmur    slight heart murmur   HERNIATED DISC 01/26/2007   HYPERLIPIDEMIA 10/06/2007   HYPERTENSION 01/26/2007   LOW BACK PAIN 01/29/2007   MENOPAUSAL DISORDER 10/26/2008   OSA (obstructive sleep apnea) 03/22/2023   OSTEOPOROSIS 06/04/2010   SHOULDER PAIN, LEFT 10/06/2007   Stroke (HCC)    TIA's   Significant Hospital Events: Including procedures, antibiotic start and stop  dates in addition to other pertinent events   10/20 Admit for pneumonia 10/21 VQ + for PE 10/22 PCCM consult for cavitary pneumonia 10/23 Transferred to SDU for BiPAP in the setting of lethargy and hypercarbia 10/24 Tolerated BiPAP most of the night. More wakeful this morning 10/25 No acute events overnight, utilize BiPAP  Interim History/Subjective:  No significant events overnight Tolerated BiPAP overnight without desaturations On 4LNC this AM slight dip in sats while sleeping No c/o pain this AM, alert and oriented to self  Objective:  Blood pressure (!) 154/64, pulse (!) 49, temperature 98.2 F (36.8 C), temperature source Oral, resp. rate 17, height 5\' 1"  (1.549 m), weight 122.7 kg, SpO2 99%.    FiO2 (%):  [30 %-40 %] 40 %   Intake/Output Summary (Last 24 hours) at 03/25/2023 0747 Last data filed at 03/24/2023 2100 Gross per 24 hour  Intake 100 ml  Output 250 ml  Net -150 ml   Filed Weights   03/14/23 1803 03/17/23 1800 03/24/23 0600  Weight: 116.6 kg 119.8 kg 122.7 kg   Physical Examination: General: Chronically ill-appearing elderly woman in NAD. HEENT: [place]/AT, anicteric sclera, PERRL, moist mucous membranes. Neuro: Awake, oriented x 1-2. Responds to verbal stimuli. Following commands consistently. Moves all 4 extremities spontaneously. Generalized weakness. CV: Mildly bradycardic, regular rhythm, no m/g/r. PULM: Breathing even and unlabored on 4LNC. Lung fields diminished bilaterally at bases, clear in upper fields. GI: Soft, nontender, nondistended. Normoactive bowel sounds. Extremities: Bilateral symmetric 1+ pitting LE edema noted. Skin: Warm/dry, no rashes. Ecchymosis to bilateral hands/forearms.  Resolved Hospital Problem List:  Assessment & Plan:   Cavitary pneumonia likely secondary to aspiration Less likely due to pulmonary infarct, but she does have PE.  - Prolonged course of antibiotics (4 weeks) for anaerobic coverage - Ok to transition to PO meds  as tolerated - DYS 2 diet per SLP recs - Repeat CT Chest after antibiotic course complete - Aspiration precautions - Prednisone taper to end today, 10/31  Acute on chronic respiratory failure with hypoxia and hypercarbia secondary to what is likely undiagnosed OSA/OHS. Staff has observed apneic periods while sleeping. She has been doing well with at bedtime BiPAP. - Supplemental O2 support for > 88% - BiPAP QHS + PRN - Assess diuresis needs daily - Bronchodilators (budesonide, DuoNebs) - Pulmonary hygiene - Will qualify for outpatient NIV based on chronic hypercarbia, d/w Clapps if this can be safely utilized/monitored as patient is known to "pocket" food in her mouth  Pulmonary embolism DVT left popliteal - AC per primary team (Eliquis)  Goals of care: Patient reportedly has DNR documents somewhere, but family has been unable to produce them. They wish for her to remain full code for now.   Per primary: AKI HTN Dementia Hypoglycemia  PCCM will sign off today, 10/31. Please let us know if assistance is needed with outpatient NIV setup.  Best Practice: (right click and "Reselect all SmartList Selections" daily)   Per Primary Team  Signature:    Faythe Ghee [facility] 03/25/23 7:47 AM  Please see [email or web address] for pager details.  From 7A-7P if no response, please call [phone] After hours, please call ELink [phone]

## 2023-03-26 DIAGNOSIS — J189 Pneumonia, unspecified organism: Secondary | ICD-10-CM | POA: Diagnosis not present

## 2023-03-26 DIAGNOSIS — J984 Other disorders of lung: Secondary | ICD-10-CM | POA: Diagnosis not present

## 2023-03-26 LAB — GLUCOSE, CAPILLARY
Glucose-Capillary: 135 mg/dL — ABNORMAL HIGH (ref 70–99)
Glucose-Capillary: 242 mg/dL — ABNORMAL HIGH (ref 70–99)
Glucose-Capillary: 242 mg/dL — ABNORMAL HIGH (ref 70–99)
Glucose-Capillary: 192 mg/dL — ABNORMAL HIGH (ref 70–99)

## 2023-03-26 LAB — BASIC METABOLIC PANEL
Anion gap: 10 (ref 5–15)
BUN: 49 mg/dL — ABNORMAL HIGH (ref 8–23)
CO2: 30 mmol/L (ref 22–32)
Calcium: 8.6 mg/dL — ABNORMAL LOW (ref 8.9–10.3)
Chloride: 98 mmol/L (ref 98–111)
Creatinine, Ser: 0.92 mg/dL (ref 0.44–1.00)
GFR, Estimated: >60 mL/min (ref >60)
Glucose, Bld: 206 mg/dL — ABNORMAL HIGH (ref 70–99)
Potassium: 4.1 mmol/L (ref 3.5–5.1)
Sodium: 138 mmol/L (ref 135–145)

## 2023-03-26 NOTE — Progress Notes (Signed)
PROGRESS NOTE Kathy Thomas  ZOX:[id number] DOB: 21-Jul-1934 DOA: 03/14/2023 PCP: Marden Noble, MD (Inactive)  Brief Narrative/Hospital Course: 39 YOF w/ dementia,cva,t2dm, obesity hypertension who presented to the ED from NH due to shortness of breath, hypoxia and recently diagnosed pneumonia for which she was started on ertapenem received 3 doses PTA, she developed tachypnea and worsening shortness of breath so sent to the ED  in [place]:[id number] requiring up to 6 L [place], labs with leukocytosis 13.9, VBG with pCO2 76 pH 7.25 respiratory virus panel negative troponin 319 creatinine 2.2 baseline 1, UA unremarkable. CXR>>dense consolidation,cardiomegaly CT head>> Unremarkable CT chest>> large masslike consolidation right middle lobe with central groundglass cavitation.  Fairly ring of consolidation concerning for pulmonary infarct, atypical cavitary pneumonia is also consideration, small right pleural effusion, enlarged pulmonary arteries coronary artery calcification and aortic atherosclerosis Patient admitted for altered mental status, acute hypoxic on chronic hypercapnic respiratory failure, cavitary pneumonia. Pulmonary consulted. 10/21> VQ scan was Mast segmental and multiple subsegmental perfusion defects positive for pulmonary embolism 10/23> transferred to stepdown for BiPAP in the setting of lethargic and hypercarbia 10/23> CT head widespread chronic small vessel ischemic changes 10/24> tolerated BiPAP most of the night and wakeful in the morning  Subjective: Patient seen and examined this morning daughter at the bedside patient is alert awake with baseline confusion slightly worse than baseline overall mentation improving Used bipap last night about a month ago night but does not remember.  Remains on 4 L nasal cannula daytime, BiPAP at bedtime, heart rate in 50s, BP stable afebrile. Daughter saying that patient having some hypoxic episodes even on BiPAP during night but more  previously  Assessment and Plan: Principal Problem:   Cavitary pneumonia Active Problems:   Pulmonary embolism (HCC)   DVT (deep venous thrombosis) (HCC)   DM type 2 (diabetes mellitus, type 2) (HCC)   HTN (hypertension)   Dementia without behavioral disturbance (HCC)   Diabetes mellitus without complication (HCC)   Pressure injury of skin   Hyperkalemia   AKI (acute kidney injury) (HCC)   Oral thrush   Hypernatremia   Dysphagia   OSA (obstructive sleep apnea)   Cavitary lesion/pneumonia Aspiration pneumonia: PCCM input appreciated, SLP following and diminished to diet, plan is to continue prolonged course of antibiotics at least 4 weeks for antibiotic coverage prednisone taper aspiration precaution.  Repeat chest CT after antibiotic completion.  Continue aspiration precaution  Acute on chronic respiratory failure with hypoxia and hypercapnia Undiagnosed OSA/OHS: Her respiratory failure is in the setting of undiagnosed OSA/OHS, with pneumonia, decreased mentation.  PCCM following PRN BiPAP transitioned to at bedtime/PRN BiPAP supplemental oxygen for saturation of at least 88%, and diuresis as able, continue bronchodilators budesonide, DuoNeb, pulmonary hygiene.  Arranging home BiPAP for nursing facility.  Pulmonary embolism based on positive VQ scan DVT LEFT popliteal vein acute Continue Eliquis.  Echo showed EF 55 to 60% G1 DD.  B/L UE swelling 2/2 hypoalbuminemia: Received Lasix/albumin with significant improvement.. cont diuresis  Hyperkalemia resolved AKI resolved  Oral thrush: Continue nystatin  Hypertension: ARB on hold.  Will continue prnc meds.  Cognitive impairment/dementia: Continue her Aricept, delirium precaution  Type 2 diabetes mellitus: Continue basal and sliding scale insulin  Dysphagia: Continue DYS 2 diet  Sinus bradycardia: AVOID Beta-blocker. Monitor.  Pressure injury buttock stage II POA continue wound care  Pressure Injury 03/14/23  Buttocks Stage 2 -  Partial thickness loss of dermis presenting as a shallow open injury with a red, pink wound bed without slough. (  Active)  03/14/23 1830  Location: Buttocks  Location Orientation:   Staging: Stage 2 -  Partial thickness loss of dermis presenting as a shallow open injury with a red, pink wound bed without slough.  Wound Description (Comments):   Present on Admission: Yes  Dressing Type Foam - Lift dressing to assess site every shift 03/25/23 0800    Morbid obesity:Patient's Body mass index is 51.11 kg/m. : Will benefit with PCP follow-up, weight loss  healthy lifestyle and outpatient sleep evaluation.   DVT prophylaxis: SCDs Start: 03/14/23 1742 Code Status:   Code Status: Full Code Family Communication: plan of care discussed with patient/patient's daughter at bedside. Patient status is: Inpatient because of respiratory failure Level of care: Stepdown   Dispo: The patient is from: SNF            Anticipated disposition: SNF once bipap available over the weekend  Objective: Vitals last 24 hrs: Vitals:   03/26/23 0500 03/26/23 0600 03/26/23 0739 03/26/23 0835  BP:  (!) 158/55    Pulse: (!) 54 (!) 50    Resp: 18 (!) 22    Temp:    97.8 F (36.6 C)  TempSrc:    Oral  SpO2: 98% 97% 98%   Weight:      Height:       Weight change:   Physical Examination: General exam: alert awake, older than stated age HEENT:Oral mucosa moist, Ear/Nose WNL grossly Respiratory system: bilaterally clear BS, no use of accessory muscle Cardiovascular system: S1 & S2 +, No JVD. Gastrointestinal system: Abdomen soft,NT,ND, BS+ Nervous System:Alert, awake, moving extremities. Extremities: LE edema + in ue.le,distal peripheral pulses palpable.  Skin: No rashes,no icterus. MSK: Normal muscle bulk,tone, power  Medications reviewed:  Scheduled Meds:  amLODipine  5 mg Oral Daily   apixaban  10 mg Oral BID   Followed by   Melene Muller ON 03/28/2023] apixaban  5 mg Oral BID   budesonide  (PULMICORT) nebulizer solution  0.5 mg Nebulization BID   cefdinir  300 mg Oral Q12H   Chlorhexidine Gluconate Cloth  6 each Topical Daily   cyanocobalamin  1,000 mcg Subcutaneous Q1200   donepezil  10 mg Oral QHS   feeding supplement (GLUCERNA SHAKE)  237 mL Oral BID BM   fluticasone  2 spray Each Nare Daily   furosemide  40 mg Intravenous Daily   insulin aspart  0-9 Units Subcutaneous TID WC   insulin glargine-yfgn  10 Units Subcutaneous Daily   ipratropium-albuterol  3 mL Nebulization BID   loratadine  10 mg Oral Daily   metroNIDAZOLE  500 mg Oral Q12H   nystatin  5 mL Oral QID   mouth rinse  15 mL Mouth Rinse 4 times per day   pantoprazole  40 mg Oral Daily   polyethylene glycol  17 g Oral BID   Continuous Infusions: Diet Order             DIET DYS 2 Room service appropriate? Yes with Assist; Fluid consistency: Thin  Diet effective now                  Intake/Output Summary (Last 24 hours) at 03/26/2023 1230 Last data filed at 03/25/2023 2200 Gross per 24 hour  Intake 30 ml  Output 1600 ml  Net -1570 ml   Net IO Since Admission: -3,191.99 mL [03/26/23 1230]  Wt Readings from Last 3 Encounters:  03/24/23 122.7 kg  04/02/21 112.5 kg  08/14/20 104.3 kg  Unresulted Labs (From admission, onward)     Start     Ordered   03/25/23 0500  Basic metabolic panel  Daily,   R     Question:  Specimen collection method  Answer:  Lab=Lab collect   03/24/23 1558          Data Reviewed: I have personally reviewed following labs and imaging studies CBC: Recent Labs  Lab 03/20/23 0307 03/21/23 0303 03/22/23 0311 03/23/23 0313 03/24/23 0320  WBC 8.3 8.0 7.4 8.0 8.4  NEUTROABS  --   --  6.1  --  6.6  HGB 11.3* 12.4 11.2* 11.3* 11.3*  HCT 42.1 46.3* 42.4 40.7 40.3  MCV 94.4 93.7 95.9 92.1 90.2  PLT 232 231 189 184 183   Basic Metabolic Panel: Recent Labs  Lab 03/20/23 0307 03/21/23 0303 03/21/23 1148 03/22/23 0311 03/23/23 0313 03/24/23 0320 03/25/23 0321  03/26/23 0316  NA 153* 146*  --  141 141 139 140 138  K 4.7 5.4*   < > 5.3* 5.4* 4.3 4.1 4.1  CL 117* 111  --  106 104 99 102 98  CO2 30 28  --  29 28 31 31 30   GLUCOSE 193* 261*  --  239* 236* 197* 179* 206*  BUN 44* 45*  --  43* 43* 46* 44* 49*  CREATININE 0.82 0.87  --  0.77 0.62 0.82 0.87 0.92  CALCIUM 8.4* 8.3*  --  8.7* 8.9 8.7* 8.6* 8.6*  MG  --   --   --   --   --  2.0  --   --   PHOS 2.1* 3.0  --  3.3 3.2 3.5  --   --    < > = values in this interval not displayed.   GFR: Estimated Creatinine Clearance: 54.9 mL/min (by C-G formula based on SCr of 0.92 mg/dL). Liver Function Tests: Recent Labs  Lab 03/20/23 0307 03/22/23 0311 03/23/23 0313 03/24/23 0320  ALBUMIN 2.5* 3.2* 2.8* 2.7*   No results for input(s): "LIPASE", "AMYLASE" in the last 168 hours. Recent Labs  Lab 03/22/23 1035  AMMONIA 32  CBG: Recent Labs  Lab 03/25/23 0732 03/25/23 1107 03/25/23 1643 03/25/23 2009 03/26/23 0754  GLUCAP 126* 143* 272* 297* 135*   Recent Results (from the past 240 hour(s))  Urine Culture (for pregnant, neutropenic or urologic patients or patients with an indwelling urinary catheter)     Status: Abnormal   Collection Time: 03/17/23  3:18 PM   Specimen: Urine, Catheterized  Result Value Ref Range Status   Specimen Description   Final    URINE, CATHETERIZED Performed at [facility], [address], [place], [place] [address]    Special Requests   Final    NONE Performed at [facility], [address], [place], [place] [address]    Culture 30,000 COLONIES/mL YEAST (A)  Final   Report Status 03/18/2023 FINAL  Final    Antimicrobials: Anti-infectives (From admission, onward)    Start     Dose/Rate Route Frequency Ordered Stop   03/25/23 0500  metroNIDAZOLE (FLAGYL) tablet 500 mg        500 mg Oral Every 12 hours 03/24/23 1733     03/24/23 2200  clindamycin (CLEOCIN) capsule 300 mg  Status:  Discontinued        300 mg Oral  Every 8 hours 03/24/23 1703 03/24/23 1730   03/24/23 2200  cefdinir (OMNICEF) capsule 300 mg  300 mg Oral Every 12 hours 03/24/23 1733     03/16/23 2200  cefTRIAXone (ROCEPHIN) 2 g in sodium chloride 0.9 % 100 mL IVPB  Status:  Discontinued        2 g 200 mL/hr over 30 Minutes Intravenous Every 24 hours 03/16/23 1322 03/24/23 1703   03/16/23 1400  metroNIDAZOLE (FLAGYL) IVPB 500 mg  Status:  Discontinued        500 mg 100 mL/hr over 60 Minutes Intravenous Every 12 hours 03/16/23 1322 03/24/23 1703   03/16/23 0915  ceFEPIme (MAXIPIME) 2 g in sodium chloride 0.9 % 100 mL IVPB  Status:  Discontinued        2 g 200 mL/hr over 30 Minutes Intravenous Every 12 hours 03/16/23 0904 03/16/23 1322   03/15/23 1800  ceFEPIme (MAXIPIME) 2 g in sodium chloride 0.9 % 100 mL IVPB  Status:  Discontinued        2 g 200 mL/hr over 30 Minutes Intravenous Every 24 hours 03/14/23 2010 03/16/23 0904   03/14/23 1954  vancomycin variable dose per unstable renal function (pharmacist dosing)  Status:  Discontinued         Does not apply See admin instructions 03/14/23 1954 03/15/23 0744   03/14/23 1930  vancomycin (VANCOCIN) IVPB 1000 mg/200 mL premix        1,000 mg 200 mL/hr over 60 Minutes Intravenous  Once 03/14/23 1842 03/15/23 0700   03/14/23 1700  vancomycin (VANCOCIN) IVPB 1000 mg/200 mL premix        1,000 mg 200 mL/hr over 60 Minutes Intravenous  Once 03/14/23 1652 03/15/23 0700   03/14/23 1700  ceFEPIme (MAXIPIME) 2 g in sodium chloride 0.9 % 100 mL IVPB        2 g 200 mL/hr over 30 Minutes Intravenous  Once 03/14/23 1652 03/15/23 0700      Culture/Microbiology    Component Value Date/Time   SDES  03/17/2023 1518    URINE, CATHETERIZED Performed at [facility], [address], [place], [place] [address]    SPECREQUEST  03/17/2023 1518    NONE Performed at [facility], [address] [place], [place] [address]    CULT 30,000 COLONIES/mL YEAST (A)  03/17/2023 1518   REPTSTATUS 03/18/2023 FINAL 03/17/2023 1518   Radiology Studies: No results found.   LOS: 12 days   Lanae Boast, MD [facility]  03/26/2023, 12:30 PM

## 2023-03-26 NOTE — Plan of Care (Signed)
  Problem: Education: Goal: Knowledge of General Education information will improve Description: Including pain rating scale, medication(s)/side effects and non-pharmacologic comfort measures Outcome: Progressing   Problem: Health Behavior/Discharge Planning: Goal: Ability to manage health-related needs will improve Outcome: Progressing   Problem: Clinical Measurements: Goal: Ability to maintain clinical measurements within normal limits will improve Outcome: Progressing Goal: Will remain free from infection Outcome: Progressing Goal: Diagnostic test results will improve Outcome: Progressing Goal: Respiratory complications will improve Outcome: Progressing Goal: Cardiovascular complication will be avoided Outcome: Progressing   Problem: Activity: Goal: Risk for activity intolerance will decrease Outcome: Progressing   Problem: Nutrition: Goal: Adequate nutrition will be maintained Outcome: Progressing   Problem: Coping: Goal: Level of anxiety will decrease Outcome: Progressing   Problem: Elimination: Goal: Will not experience complications related to bowel motility Outcome: Progressing Goal: Will not experience complications related to urinary retention Outcome: Progressing   Problem: Pain Managment: Goal: General experience of comfort will improve Outcome: Progressing   Problem: Safety: Goal: Ability to remain free from injury will improve Outcome: Progressing   Problem: Skin Integrity: Goal: Risk for impaired skin integrity will decrease Outcome: Progressing   Problem: Clinical Measurements: Goal: Ability to maintain a body temperature in the normal range will improve Outcome: Progressing   Problem: Respiratory: Goal: Ability to maintain adequate ventilation will improve Outcome: Progressing   Problem: Education: Goal: Ability to describe self-care measures that may prevent or decrease complications (Diabetes Survival Skills Education) will  improve Outcome: Progressing Goal: Individualized Educational Video(s) Outcome: Progressing   Problem: Coping: Goal: Ability to adjust to condition or change in health will improve Outcome: Progressing   Problem: Fluid Volume: Goal: Ability to maintain a balanced intake and output will improve Outcome: Progressing   Problem: Health Behavior/Discharge Planning: Goal: Ability to identify and utilize available resources and services will improve Outcome: Progressing Goal: Ability to manage health-related needs will improve Outcome: Progressing   Problem: Metabolic: Goal: Ability to maintain appropriate glucose levels will improve Outcome: Progressing   Problem: Nutritional: Goal: Maintenance of adequate nutrition will improve Outcome: Progressing Goal: Progress toward achieving an optimal weight will improve Outcome: Progressing   Problem: Skin Integrity: Goal: Risk for impaired skin integrity will decrease Outcome: Progressing   Problem: Tissue Perfusion: Goal: Adequacy of tissue perfusion will improve Outcome: Progressing

## 2023-03-26 NOTE — Progress Notes (Signed)
   03/26/23 2326  BiPAP/CPAP/SIPAP  BiPAP/CPAP/SIPAP Pt Type Adult  BiPAP/CPAP/SIPAP V60  Mask Type Full face mask (mepilex tape placed on the bridge of the nose for comfort)  Mask Size Large  Set Rate 14 breaths/min  Respiratory Rate 24 breaths/min  IPAP 18 cmH20  EPAP 6 cmH2O  FiO2 (%) 40 %  Minute Ventilation 8  Leak 22  Peak Inspiratory Pressure (PIP) 18  Tidal Volume (Vt) 449  Patient Home Equipment No  Auto Titrate No  Press High Alarm 35 cmH2O  Press Low Alarm 5 cmH2O  Oxygen Percent 40 %  BiPAP/CPAP /SiPAP Vitals  Pulse Rate (!) 57  Resp (!) 24  BP (!) 143/50  SpO2 98 %  Bilateral Breath Sounds Clear;Diminished  MEWS Score/Color  MEWS Score 1  MEWS Score Color Green

## 2023-03-26 NOTE — TOC Progression Note (Addendum)
Transition of Care [facility]) - Progression Note   Patient Details  Name: Kathy Thomas MRN: [id number] Date of Birth: December 31, 1934  Transition of [facility]) CM/SW Contact  Ewing Schlein, LCSW Phone Number: 03/26/2023, 1:57 PM  Clinical Narrative: CSW spoke with patient's daughter, Kathy Thomas, regarding [facility]. Per daughter, she will be going to the facility this afternoon for a tour. CSW requested call back from daughter after the tour if daughter wants to proceed with that facility.  Addendum: 3:38pm-CSW received return call from daughter and after touring [facility], daughter is agreeable to having patient return to [facility]. CSW updated [facility] in admissions.  Expected Discharge Plan: Skilled Nursing Facility Barriers to Discharge: No Barriers Identified  Expected Discharge Plan and Services In-house Referral: NA Discharge Planning Services: NA Post Acute Care Choice: Skilled Nursing Facility Living arrangements for the past 2 months: Skilled Nursing Facility           DME Arranged: Bipap DME Agency: [employer] Date DME Agency Contacted: 03/19/23 Time DME Agency Contacted: 1213 Representative spoke with at DME Agency: Vaughan Basta HH Arranged:  (NA)  Social Determinants of Health (SDOH) Interventions SDOH Screenings   Food Insecurity: No Food Insecurity (03/14/2023)  Housing: Low Risk  (03/14/2023)  Transportation Needs: No Transportation Needs (03/14/2023)  Utilities: Not At Risk (03/14/2023)  Depression (PHQ2-9): Low Risk  (08/14/2020)  Tobacco Use: Medium Risk (03/14/2023)   Readmission Risk Interventions     No data to display

## 2023-03-26 NOTE — Progress Notes (Signed)
Physical Therapy Treatment Patient Details Name: Kathy Thomas MRN: [id number] DOB: 04/05/1935 Today's Date: 03/26/2023   History of Present Illness 88 y.o. female with medical history significant of dementia, hypertension who presented to the emergency department due to shortness of breath.  Patient was found to be hypoxic 3 days ago and underwent chest x-ray which showed concern for community-acquired pneumonia.  She was started on ertapenem and received 3 doses.  By the nursing facility she developed tachypnea and shortness of breath so EMS was called to bring her to the emergency department further assessment.  On EMS arrival she was found to be hypoglycemic.  She was brought to the ER where she was hypoxic requiring up to 6 L nasal cannula. Pt was admitted for concerns of cavitary PNA    PT Comments  Pt seen in Step Down ICU RM# [id number] General Comments: increased alertness this session.  Daughter present and helpful. General bed mobility comments: Performed side to side rolling Total Assist + 2 pt <5% several times to change wet bed pad and replace as well as apply MAXI MOVE lift pad under pt. General transfer comment: Used Max Sky LIFT to assist pt OOB to recliner was difficult and required + 2 assist. Positioned in recliner.  Performed a few long sitting activities to engage ABD and a few AP's. Pt is from a LTC SNF and plans to return o a LTC SNF.    If plan is discharge home, recommend the following: A lot of help with bathing/dressing/bathroom;Direct supervision/assist for medications management;Two people to help with walking and/or transfers;Two people to help with bathing/dressing/bathroom   Can travel by private vehicle     No  Equipment Recommendations  None recommended by PT    Recommendations for Other Services       Precautions / Restrictions Precautions Precautions: Fall Precaution Comments: body habitus, monitor sats Restrictions Weight Bearing Restrictions: No      Mobility  Bed Mobility Overal bed mobility: Needs Assistance Bed Mobility: Rolling Rolling: +2 for physical assistance, Total assist, Used rails         General bed mobility comments: Performed side to side rolling Total Assist + 2 pt <5% several times to change wet bed pad and replace as well as apply MAXI MOVE lift pad under pt    Transfers Overall transfer level: Needs assistance                 General transfer comment: Used Max Sky LIFT to assist pt OOB to recliner was difficult and required + 2 assist. Transfer via Lift Equipment: [place]y  Ambulation/Gait                   Stairs             Wheelchair Mobility     Tilt Bed    Modified Rankin (Stroke Patients Only)       Balance                                            Cognition Arousal: Alert Behavior During Therapy: WFL for tasks assessed/performed Overall Cognitive Status: Within Functional Limits for tasks assessed                                 General Comments: increased alertness this  session.  Daughter present and helpful.        Exercises      General Comments        Pertinent Vitals/Pain Pain Assessment Pain Assessment: Faces Faces Pain Scale: Hurts a little bit Pain Location: B LE with activity Pain Descriptors / Indicators: Cramping    Home Living                          Prior Function            PT Goals (current goals can now be found in the care plan section) Progress towards PT goals: Progressing toward goals    Frequency    Min 1X/week      PT Plan      Co-evaluation              AM-PAC PT "6 Clicks" Mobility   Outcome Measure  Help needed turning from your back to your side while in a flat bed without using bedrails?: Total Help needed moving from lying on your back to sitting on the side of a flat bed without using bedrails?: Total Help needed moving to and from a bed to a chair  (including a wheelchair)?: Total Help needed standing up from a chair using your arms (e.g., wheelchair or bedside chair)?: Total Help needed to walk in hospital room?: Total Help needed climbing 3-5 steps with a railing? : Total 6 Click Score: 6    End of Session Equipment Utilized During Treatment: Gait belt Activity Tolerance: Patient limited by fatigue;Other (comment) (body habitus) Patient left: in chair;with call bell/phone within reach;with family/visitor present Nurse Communication: Mobility status;Need for lift equipment PT Visit Diagnosis: Other abnormalities of gait and mobility (R26.89)     Time: 1100-1143 PT Time Calculation (min) (ACUTE ONLY): 43 min  Charges:    $Therapeutic Activity: 38-52 mins PT General Charges $$ ACUTE PT VISIT: 1 Visit                     Felecia Shelling  PTA Acute  Rehabilitation Services Office M-F          [phone]

## 2023-03-26 NOTE — Progress Notes (Signed)
   03/26/23 2015  BiPAP/CPAP/SIPAP  Reason BIPAP/CPAP not in use Other(comment) (no need of bipap at this time. Pt is not ready yet machine remained bedside)  BiPAP/CPAP /SiPAP Vitals  Pulse Rate (!) 55  Resp (!) 23  SpO2 100 %  MEWS Score/Color  MEWS Score 1  MEWS Score Color Green

## 2023-03-27 DIAGNOSIS — J189 Pneumonia, unspecified organism: Secondary | ICD-10-CM | POA: Diagnosis not present

## 2023-03-27 DIAGNOSIS — J984 Other disorders of lung: Secondary | ICD-10-CM | POA: Diagnosis not present

## 2023-03-27 LAB — BLOOD GAS, VENOUS
Acid-Base Excess: 11.7 mmol/L — ABNORMAL HIGH (ref 0.0–2.0)
Bicarbonate: 38.7 mmol/L — ABNORMAL HIGH (ref 20.0–28.0)
O2 Saturation: 95.6 %
Patient temperature: 36.4
pCO2, Ven: 60 mm[Hg] (ref 44–60)
pH, Ven: 7.42 (ref 7.25–7.43)
pO2, Ven: 65 mm[Hg] — ABNORMAL HIGH (ref 32–45)

## 2023-03-27 LAB — GLUCOSE, CAPILLARY
Glucose-Capillary: 125 mg/dL — ABNORMAL HIGH (ref 70–99)
Glucose-Capillary: 191 mg/dL — ABNORMAL HIGH (ref 70–99)
Glucose-Capillary: 169 mg/dL — ABNORMAL HIGH (ref 70–99)
Glucose-Capillary: 177 mg/dL — ABNORMAL HIGH (ref 70–99)

## 2023-03-27 LAB — BASIC METABOLIC PANEL
Anion gap: 6 (ref 5–15)
BUN: 42 mg/dL — ABNORMAL HIGH (ref 8–23)
CO2: 36 mmol/L — ABNORMAL HIGH (ref 22–32)
Calcium: 8.5 mg/dL — ABNORMAL LOW (ref 8.9–10.3)
Chloride: 95 mmol/L — ABNORMAL LOW (ref 98–111)
Creatinine, Ser: 0.76 mg/dL (ref 0.44–1.00)
GFR, Estimated: >60 mL/min (ref >60)
Glucose, Bld: 149 mg/dL — ABNORMAL HIGH (ref 70–99)
Potassium: 3.8 mmol/L (ref 3.5–5.1)
Sodium: 137 mmol/L (ref 135–145)

## 2023-03-27 MED ORDER — CHLORHEXIDINE GLUCONATE CLOTH 2 % EX PADS
6.0000 | MEDICATED_PAD | Freq: Every day | CUTANEOUS | Status: DC
  Administered 2023-03-27 – 2023-04-02 (×6): 6 via TOPICAL

## 2023-03-27 MED ORDER — FUROSEMIDE 40 MG PO TABS: 40.0000 mg | ORAL_TABLET | Freq: Every day | ORAL | Status: DC

## 2023-03-27 NOTE — Progress Notes (Signed)
PROGRESS NOTE Kathy Thomas  QIO:[id number] DOB: [birth year] DOA: 03/14/2023 PCP: Marden Noble, MD (Inactive)  Brief Narrative/Hospital Course: 84 YOF w/ dementia,cva,t2dm, obesity hypertension who presented to the ED from NH due to shortness of breath, hypoxia and recently diagnosed pneumonia for which she was started on ertapenem received 3 doses PTA, she developed tachypnea and worsening shortness of breath so sent to the ED  in LK:[id number] requiring up to 6 L [place], labs with leukocytosis 13.9, VBG with pCO2 76 pH 7.25 respiratory virus panel negative troponin 319 creatinine 2.2 baseline 1, UA unremarkable. CXR>>dense consolidation,cardiomegaly CT head>> Unremarkable CT chest>> large masslike consolidation right middle lobe with central groundglass cavitation.  Fairly ring of consolidation concerning for pulmonary infarct, atypical cavitary pneumonia is also consideration, small right pleural effusion, enlarged pulmonary arteries coronary artery calcification and aortic atherosclerosis Patient admitted for altered mental status, acute hypoxic on chronic hypercapnic respiratory failure, cavitary pneumonia. Pulmonary consulted. 10/21> VQ scan was Mast segmental and multiple subsegmental perfusion defects positive for pulmonary embolism 10/23> transferred to stepdown for BiPAP in the setting of lethargic and hypercarbia 10/23> CT head widespread chronic small vessel ischemic changes 10/24> tolerated BiPAP most of the night and wakeful in the morning Patient has been clinically stabilized.,  Remains on BiPAP nightly/as needed, oral antibiotic regimen   Subjective: Seen and examined, Overnight vitals/labs/events reviewed  -Afebrile BP stable labs largely stable bicarb up some at 36-vbg ordered. Daughter feeding her- states RN had needle stick injury No issue with BiPAP overnight  Assessment and Plan: Principal Problem:   Cavitary pneumonia Active Problems:   Pulmonary embolism (HCC)   DVT  (deep venous thrombosis) (HCC)   DM type 2 (diabetes mellitus, type 2) (HCC)   HTN (hypertension)   Dementia without behavioral disturbance (HCC)   Diabetes mellitus without complication (HCC)   Pressure injury of skin   Hyperkalemia   AKI (acute kidney injury) (HCC)   Oral thrush   Hypernatremia   Dysphagia   OSA (obstructive sleep apnea)   Cavitary lesion/pneumonia Aspiration pneumonia: PCCM input appreciated, seen by SLP on DYS 2 diet.antibiotic changed to Flagyl, Omnicef -plan for 4-week course , prednisone tapered off. Cont aspiration precaution.Repeat chest CT after antibiotic completion per PCCM as OP.  Acute on chronic respiratory failure with hypoxia and hypercapnia Undiagnosed OSA/OHS: respiratory failure in the setting of undiagnosed OSA/OHS, with pneumonia, decreased mentation.  PCCM following PRN BiPAP transitioned to at bedtime/PRN BiPAP supplemental oxygen for saturation of at least 88%, and diuresis as able on iv lasix.  COntinue bronchodilators budesonide,pulmonary hygiene. AWAITING  for BiPAP for nursing facility.  Bicarb is up at 36- will Check VBG, likely from diuresis.  Sent message to [facility] to check on status of BiPAP  Pulmonary embolism based on positive VQ scan DVT LEFT popliteal vein acute Continue Eliquis w/ losading.Echo showed EF 55 to 60% G1 DD.  B/L UE swelling 2/2 hypoalbuminemia: Completing IV Lasix today weight down to 266 from 270, resume home Lasix from tomorrow. Net IO Since Admission: -5,791.99 mL [03/27/23 1014]   Hyperkalemia resolved AKI resolved, tolerating diuresis.  Oral thrush: Continue nystatin  Hypertension: ARB on hold. Will continue prn meds.  Cognitive impairment/dementia: Continue her Aricept, delirium precaution  Type 2 diabetes mellitus: Cbg fairly controlled continue basal and sliding scale insulin Recent Labs  Lab 03/26/23 0754 03/26/23 1234 03/26/23 1607 03/26/23 2204 03/27/23 0759  GLUCAP 135* 242* 242* 192* 125*     Dysphagia: Continue DYS 2 diet  Sinus bradycardia: AVOID Beta-blocker.  Monitor.  Pressure injury buttock stage II POA continue wound care Pressure Injury 03/14/23 Buttocks Stage 2 -  Partial thickness loss of dermis presenting as a shallow open injury with a red, pink wound bed without slough. (Active)  03/14/23 1830  Location: Buttocks  Location Orientation:   Staging: Stage 2 -  Partial thickness loss of dermis presenting as a shallow open injury with a red, pink wound bed without slough.  Wound Description (Comments):   Present on Admission: Yes  Dressing Type Foam - Lift dressing to assess site every shift 03/25/23 0800    Morbid obesity:Patient's Body mass index is 50.32 kg/m. : Will benefit with PCP follow-up, weight loss  healthy lifestyle and outpatient sleep evaluation.   DVT prophylaxis: SCDs Start: 03/14/23 1742 Code Status:   Code Status: Full Code Family Communication: plan of care discussed with patient/patient's daughter at bedside. Patient status is: Inpatient because of respiratory failure Level of care: Stepdown   Dispo: The patient is from: SNF            Anticipated disposition: SNF once bipap available over the weekend  Objective: Vitals last 24 hrs: Vitals:   03/27/23 0733 03/27/23 0734 03/27/23 0818 03/27/23 0844  BP:      Pulse:  (!) 55    Resp:  18    Temp:    98.2 F (36.8 C)  TempSrc:    Oral  SpO2: 96% 96%    Weight:   120.8 kg   Height:       Weight change:   Physical Examination: General exam: alert awake, oriented with baseline dementia  HEENT:Oral mucosa moist, Ear/Nose WNL grossly Respiratory system: Bilaterally clear BS,no use of accessory muscle Cardiovascular system: S1 & S2 +, No JVD. Gastrointestinal system: Abdomen soft,NT,ND, BS+ Nervous System: Alert, awake, moving all extremities,and following commands. Extremities: LE edema +,distal peripheral pulses palpable and warm.  Skin: No rashes,no icterus. MSK: Normal  muscle bulk,tone, power   Medications reviewed:  Scheduled Meds:  amLODipine  5 mg Oral Daily   apixaban  10 mg Oral BID   Followed by   Melene Muller ON 03/28/2023] apixaban  5 mg Oral BID   budesonide (PULMICORT) nebulizer solution  0.5 mg Nebulization BID   cefdinir  300 mg Oral Q12H   Chlorhexidine Gluconate Cloth  6 each Topical Q2200   cyanocobalamin  1,000 mcg Subcutaneous Q1200   donepezil  10 mg Oral QHS   feeding supplement (GLUCERNA SHAKE)  237 mL Oral BID BM   fluticasone  2 spray Each Nare Daily   furosemide  40 mg Intravenous Daily   insulin aspart  0-9 Units Subcutaneous TID WC   insulin glargine-yfgn  10 Units Subcutaneous Daily   ipratropium-albuterol  3 mL Nebulization BID   loratadine  10 mg Oral Daily   metroNIDAZOLE  500 mg Oral Q12H   nystatin  5 mL Oral QID   mouth rinse  15 mL Mouth Rinse 4 times per day   pantoprazole  40 mg Oral Daily   polyethylene glycol  17 g Oral BID   Continuous Infusions: Diet Order             DIET DYS 2 Room service appropriate? Yes with Assist; Fluid consistency: Thin  Diet effective now                  Intake/Output Summary (Last 24 hours) at 03/27/2023 1014 Last data filed at 03/27/2023 0424 Gross per 24 hour  Intake --  Output 2600 ml  Net -2600 ml   Net IO Since Admission: -5,791.99 mL [03/27/23 1014]  Wt Readings from Last 3 Encounters:  03/27/23 120.8 kg  04/02/21 112.5 kg  08/14/20 104.3 kg     Unresulted Labs (From admission, onward)     Start     Ordered   03/27/23 0925  Blood gas, venous  Once,   R        03/27/23 0925   03/25/23 0500  Basic metabolic panel  Daily,   R     Question:  Specimen collection method  Answer:  Lab=Lab collect   03/24/23 1558          Data Reviewed: I have personally reviewed following labs and imaging studies CBC: Recent Labs  Lab 03/21/23 0303 03/22/23 0311 03/23/23 0313 03/24/23 0320  WBC 8.0 7.4 8.0 8.4  NEUTROABS  --  6.1  --  6.6  HGB 12.4 11.2* 11.3* 11.3*   HCT 46.3* 42.4 40.7 40.3  MCV 93.7 95.9 92.1 90.2  PLT 231 189 184 183   Basic Metabolic Panel: Recent Labs  Lab 03/21/23 0303 03/21/23 1148 03/22/23 0311 03/23/23 0313 03/24/23 0320 03/25/23 0321 03/26/23 0316 03/27/23 0259  NA 146*  --  141 141 139 140 138 137  K 5.4*   < > 5.3* 5.4* 4.3 4.1 4.1 3.8  CL 111  --  106 104 99 102 98 95*  CO2 28  --  29 28 31 31 30  36*  GLUCOSE 261*  --  239* 236* 197* 179* 206* 149*  BUN 45*  --  43* 43* 46* 44* 49* 42*  CREATININE 0.87  --  0.77 0.62 0.82 0.87 0.92 0.76  CALCIUM 8.3*  --  8.7* 8.9 8.7* 8.6* 8.6* 8.5*  MG  --   --   --   --  2.0  --   --   --   PHOS 3.0  --  3.3 3.2 3.5  --   --   --    < > = values in this interval not displayed.   GFR: Estimated Creatinine Clearance: 62.5 mL/min (by C-G formula based on SCr of 0.76 mg/dL). Liver Function Tests: Recent Labs  Lab 03/22/23 0311 03/23/23 0313 03/24/23 0320  ALBUMIN 3.2* 2.8* 2.7*   No results for input(s): "LIPASE", "AMYLASE" in the last 168 hours. Recent Labs  Lab 03/22/23 1035  AMMONIA 32  CBG: Recent Labs  Lab 03/26/23 0754 03/26/23 1234 03/26/23 1607 03/26/23 2204 03/27/23 0759  GLUCAP 135* 242* 242* 192* 125*   Recent Results (from the past 240 hour(s))  Urine Culture (for pregnant, neutropenic or urologic patients or patients with an indwelling urinary catheter)     Status: Abnormal   Collection Time: 03/17/23  3:18 PM   Specimen: Urine, Catheterized  Result Value Ref Range Status   Specimen Description   Final    URINE, CATHETERIZED Performed at [facility], [address] [place], [place] [address]    Special Requests   Final    NONE Performed at [facility], [address] [place], [place] [address]    Culture 30,000 COLONIES/mL YEAST (A)  Final   Report Status 03/18/2023 FINAL  Final    Antimicrobials: Anti-infectives (From admission, onward)    Start     Dose/Rate Route Frequency Ordered Stop    03/25/23 0500  metroNIDAZOLE (FLAGYL) tablet 500 mg        500 mg Oral Every 12 hours  03/24/23 1733     03/24/23 2200  clindamycin (CLEOCIN) capsule 300 mg  Status:  Discontinued        300 mg Oral Every 8 hours 03/24/23 1703 03/24/23 1730   03/24/23 2200  cefdinir (OMNICEF) capsule 300 mg        300 mg Oral Every 12 hours 03/24/23 1733     03/16/23 2200  cefTRIAXone (ROCEPHIN) 2 g in sodium chloride 0.9 % 100 mL IVPB  Status:  Discontinued        2 g 200 mL/hr over 30 Minutes Intravenous Every 24 hours 03/16/23 1322 03/24/23 1703   03/16/23 1400  metroNIDAZOLE (FLAGYL) IVPB 500 mg  Status:  Discontinued        500 mg 100 mL/hr over 60 Minutes Intravenous Every 12 hours 03/16/23 1322 03/24/23 1703   03/16/23 0915  ceFEPIme (MAXIPIME) 2 g in sodium chloride 0.9 % 100 mL IVPB  Status:  Discontinued        2 g 200 mL/hr over 30 Minutes Intravenous Every 12 hours 03/16/23 0904 03/16/23 1322   03/15/23 1800  ceFEPIme (MAXIPIME) 2 g in sodium chloride 0.9 % 100 mL IVPB  Status:  Discontinued        2 g 200 mL/hr over 30 Minutes Intravenous Every 24 hours 03/14/23 2010 03/16/23 0904   03/14/23 1954  vancomycin variable dose per unstable renal function (pharmacist dosing)  Status:  Discontinued         Does not apply See admin instructions 03/14/23 1954 03/15/23 0744   03/14/23 1930  vancomycin (VANCOCIN) IVPB 1000 mg/200 mL premix        1,000 mg 200 mL/hr over 60 Minutes Intravenous  Once 03/14/23 1842 03/15/23 0700   03/14/23 1700  vancomycin (VANCOCIN) IVPB 1000 mg/200 mL premix        1,000 mg 200 mL/hr over 60 Minutes Intravenous  Once 03/14/23 1652 03/15/23 0700   03/14/23 1700  ceFEPIme (MAXIPIME) 2 g in sodium chloride 0.9 % 100 mL IVPB        2 g 200 mL/hr over 30 Minutes Intravenous  Once 03/14/23 1652 03/15/23 0700      Culture/Microbiology    Component Value Date/Time   SDES  03/17/2023 1518    URINE, CATHETERIZED Performed at [facility], [address] [place], [place] [address]    SPECREQUEST  03/17/2023 1518    NONE Performed at [facility], [address], [place], [place] [address]    CULT 30,000 COLONIES/mL YEAST (A) 03/17/2023 1518   REPTSTATUS 03/18/2023 FINAL 03/17/2023 1518   Radiology Studies: No results found.   LOS: 13 days   Lanae Boast, MD [facility]  03/27/2023, 10:14 AM

## 2023-03-27 NOTE — Progress Notes (Signed)
   03/27/23 0309  BiPAP/CPAP/SIPAP  BiPAP/CPAP/SIPAP Pt Type Adult  BiPAP/CPAP/SIPAP V60  Mask Type Full face mask  Mask Size Large  Set Rate 14 breaths/min  Respiratory Rate 17 breaths/min  IPAP 16 cmH20  EPAP 8 cmH2O  FiO2 (%) 40 %  Minute Ventilation 10  Leak 22  Peak Inspiratory Pressure (PIP) 18  Tidal Volume (Vt) 438  Patient Home Equipment No  Auto Titrate No  Press High Alarm 35 cmH2O  Press Low Alarm 5 cmH2O  BiPAP/CPAP /SiPAP Vitals  Pulse Rate (!) 50  Resp (!) 22  SpO2 98 %  Bilateral Breath Sounds Clear;Diminished  MEWS Score/Color  MEWS Score 2  MEWS Score Color Yellow

## 2023-03-27 NOTE — Plan of Care (Signed)
  Problem: Nutrition: Goal: Adequate nutrition will be maintained Outcome: Progressing   Problem: Coping: Goal: Level of anxiety will decrease Outcome: Progressing   Problem: Elimination: Goal: Will not experience complications related to bowel motility Outcome: Progressing Goal: Will not experience complications related to urinary retention Outcome: Progressing   Problem: Education: Goal: Knowledge of General Education information will improve Description: Including pain rating scale, medication(s)/side effects and non-pharmacologic comfort measures Outcome: Not Progressing   Problem: Health Behavior/Discharge Planning: Goal: Ability to manage health-related needs will improve Outcome: Not Progressing   Problem: Activity: Goal: Risk for activity intolerance will decrease Outcome: Not Progressing

## 2023-03-27 NOTE — Plan of Care (Signed)
  Problem: Clinical Measurements: Goal: Ability to maintain clinical measurements within normal limits will improve Outcome: Progressing Goal: Will remain free from infection Outcome: Progressing Goal: Diagnostic test results will improve Outcome: Progressing Goal: Respiratory complications will improve Outcome: Progressing Goal: Cardiovascular complication will be avoided Outcome: Progressing   Problem: Nutrition: Goal: Adequate nutrition will be maintained Outcome: Progressing   Problem: Elimination: Goal: Will not experience complications related to bowel motility Outcome: Progressing Goal: Will not experience complications related to urinary retention Outcome: Progressing   Problem: Pain Managment: Goal: General experience of comfort will improve Outcome: Progressing   

## 2023-03-27 NOTE — Progress Notes (Signed)
   03/27/23 2021  BiPAP/CPAP/SIPAP  Reason BIPAP/CPAP not in use Other(comment) (Pt not ready yet. pt on 4LNC and doing well. Machine remained bedside)  BiPAP/CPAP /SiPAP Vitals  Pulse Rate 60  Resp (!) 23  SpO2 100 %  MEWS Score/Color  MEWS Score 1  MEWS Score Color Green

## 2023-03-27 NOTE — Progress Notes (Signed)
   03/27/23 2339  BiPAP/CPAP/SIPAP  BiPAP/CPAP/SIPAP Pt Type Adult  BiPAP/CPAP/SIPAP V60  Mask Type Full face mask  Mask Size Large  Set Rate 14 breaths/min  Respiratory Rate 14 breaths/min  IPAP 16 cmH20  EPAP 8 cmH2O  FiO2 (%) 40 %  Minute Ventilation 11  Leak 6  Peak Inspiratory Pressure (PIP) 17  Tidal Volume (Vt) 402  Patient Home Equipment No  Auto Titrate No  Press High Alarm 35 cmH2O  Press Low Alarm 5 cmH2O  Oxygen Percent 40 %  BiPAP/CPAP /SiPAP Vitals  Pulse Rate 63  Resp 14  BP (!) 131/54  SpO2 98 %  Bilateral Breath Sounds Diminished  MEWS Score/Color  MEWS Score 0  MEWS Score Color Kathy Thomas

## 2023-03-28 DIAGNOSIS — J189 Pneumonia, unspecified organism: Secondary | ICD-10-CM | POA: Diagnosis not present

## 2023-03-28 DIAGNOSIS — J984 Other disorders of lung: Secondary | ICD-10-CM | POA: Diagnosis not present

## 2023-03-28 LAB — BASIC METABOLIC PANEL
Anion gap: 7 (ref 5–15)
Anion gap: 9 (ref 5–15)
BUN: 39 mg/dL — ABNORMAL HIGH (ref 8–23)
BUN: 38 mg/dL — ABNORMAL HIGH (ref 8–23)
CO2: 35 mmol/L — ABNORMAL HIGH (ref 22–32)
CO2: 33 mmol/L — ABNORMAL HIGH (ref 22–32)
Calcium: 8.2 mg/dL — ABNORMAL LOW (ref 8.9–10.3)
Calcium: 8.5 mg/dL — ABNORMAL LOW (ref 8.9–10.3)
Chloride: 97 mmol/L — ABNORMAL LOW (ref 98–111)
Chloride: 95 mmol/L — ABNORMAL LOW (ref 98–111)
Creatinine, Ser: 0.78 mg/dL (ref 0.44–1.00)
Creatinine, Ser: 0.68 mg/dL (ref 0.44–1.00)
GFR, Estimated: >60 mL/min (ref >60)
GFR, Estimated: >60 mL/min (ref >60)
Glucose, Bld: 134 mg/dL — ABNORMAL HIGH (ref 70–99)
Glucose, Bld: 191 mg/dL — ABNORMAL HIGH (ref 70–99)
Potassium: 3.6 mmol/L (ref 3.5–5.1)
Potassium: 3.6 mmol/L (ref 3.5–5.1)
Sodium: 139 mmol/L (ref 135–145)
Sodium: 137 mmol/L (ref 135–145)

## 2023-03-28 LAB — GLUCOSE, CAPILLARY
Glucose-Capillary: 133 mg/dL — ABNORMAL HIGH (ref 70–99)
Glucose-Capillary: 135 mg/dL — ABNORMAL HIGH (ref 70–99)
Glucose-Capillary: 195 mg/dL — ABNORMAL HIGH (ref 70–99)
Glucose-Capillary: 185 mg/dL — ABNORMAL HIGH (ref 70–99)
Glucose-Capillary: 237 mg/dL — ABNORMAL HIGH (ref 70–99)

## 2023-03-28 MED ORDER — EMPAGLIFLOZIN 10 MG PO TABS
10.0000 mg | ORAL_TABLET | Freq: Every day | ORAL | Status: DC
  Administered 2023-03-29 – 2023-04-02 (×5): 10 mg via ORAL
  Filled 2023-03-28 (×5): qty 1

## 2023-03-28 MED ORDER — ACETAZOLAMIDE 250 MG PO TABS
500.0000 mg | ORAL_TABLET | Freq: Once | ORAL | Status: AC
  Administered 2023-03-28: 500 mg via ORAL
  Filled 2023-03-28: qty 2

## 2023-03-28 MED ORDER — LOSARTAN POTASSIUM 50 MG PO TABS
25.0000 mg | ORAL_TABLET | Freq: Every day | ORAL | Status: DC
  Administered 2023-03-29 – 2023-03-30 (×2): 25 mg via ORAL
  Filled 2023-03-28 (×2): qty 1

## 2023-03-28 MED ORDER — FUROSEMIDE 10 MG/ML IJ SOLN
40.0000 mg | Freq: Two times a day (BID) | INTRAMUSCULAR | Status: DC
  Administered 2023-03-28 – 2023-03-29 (×3): 40 mg via INTRAVENOUS
  Filled 2023-03-28 (×3): qty 4

## 2023-03-28 MED ORDER — GERHARDT'S BUTT CREAM: TOPICAL_CREAM | CUTANEOUS | Status: DC | PRN

## 2023-03-28 NOTE — Progress Notes (Signed)
Pt placed on bipap she stayed on it for few minutes and started to desaturate. Tried to switch from Bipap to Avaps to see any improvements in the VT. Pt was getting agitated and kept moving her head. Pt is not tolerating the bipap at this time. PT placed on 6L [place] and she is doing well at this time. RN is aware. Machine remained bedside at this time.

## 2023-03-28 NOTE — Plan of Care (Signed)
  Problem: Clinical Measurements: Goal: Ability to maintain clinical measurements within normal limits will improve Outcome: Progressing Goal: Will remain free from infection Outcome: Progressing Goal: Diagnostic test results will improve Outcome: Progressing Goal: Cardiovascular complication will be avoided Outcome: Progressing   Problem: Activity: Goal: Risk for activity intolerance will decrease Outcome: Progressing   Problem: Nutrition: Goal: Adequate nutrition will be maintained Outcome: Progressing   Problem: Coping: Goal: Level of anxiety will decrease Outcome: Progressing   Problem: Elimination: Goal: Will not experience complications related to bowel motility Outcome: Progressing Goal: Will not experience complications related to urinary retention Outcome: Progressing   Problem: Pain Managment: Goal: General experience of comfort will improve Outcome: Progressing   Problem: Safety: Goal: Ability to remain free from injury will improve Outcome: Progressing   Problem: Clinical Measurements: Goal: Respiratory complications will improve Outcome: Not Progressing   Problem: Skin Integrity: Goal: Risk for impaired skin integrity will decrease Outcome: Not Progressing

## 2023-03-28 NOTE — Progress Notes (Signed)
   03/28/23 0234  BiPAP/CPAP/SIPAP  BiPAP/CPAP/SIPAP Pt Type Adult  BiPAP/CPAP/SIPAP V60  Mask Type Full face mask  Mask Size Large  Set Rate 14 breaths/min  Respiratory Rate 19 breaths/min  IPAP 16 cmH20  EPAP 8 cmH2O  FiO2 (%) 40 %  Minute Ventilation 10  Leak 10  Peak Inspiratory Pressure (PIP) 15  Tidal Volume (Vt) 580  Patient Home Equipment No  Auto Titrate No  Press High Alarm 35 cmH2O  Press Low Alarm 5 cmH2O  Oxygen Percent 40 %  BiPAP/CPAP /SiPAP Vitals  Pulse Rate (!) 51  Resp (!) 23  BP (!) 165/32  SpO2 95 %  Bilateral Breath Sounds Diminished  MEWS Score/Color  MEWS Score 1  MEWS Score Color [place] Si

## 2023-03-28 NOTE — Progress Notes (Signed)
[facility] Progress Note Patient: Kathy Thomas RUE:[id number] DOB: 1935-05-23 DOA: 03/14/2023  DOS: the patient was seen and examined on 03/28/2023  Brief hospital course: 8 YOF w/ CVA, HTN, T2DM, dementia. 10/20> patient admitted for altered mental status, acute hypoxic on chronic hypercapnic respiratory failure, cavitary pneumonia. CT chest RML pneumonia with cavitation.  CT head unremarkable. 10/21> VQ scan positive for PE.  Started on anticoagulation 10/22> PCCM consult. 10/23> transferred to stepdown for BiPAP in the setting of lethargic and hypercarbia. ongoing delirium, repeat CT head unremarkable.  10/31 PCCM signed off.  Recommend NIV on discharge.  Currently requiring IV diuresis.  Also awaiting arrangement of NIV at [facility].   Assessment and Plan: Aspiration pneumonia with cavitary lesion. Recurrent presentation. PCCM was consulted. Recommend antibiotic for prolonged duration. Currently on 4 weeks course of Omnicef and Flagyl. Completed prednisone taper as well. Patient will follow-up with PCCM outpatient.  For a repeat CT chest. SLP following.  Currently on dysphagia 2 diet.  Acute on chronic respiratory failure with hypoxia and hypercarbia. Morbid obesity with obesity hypoventilation syndrome. Undiagnosed OSA,?  Central apnea Body mass index is 49.32 kg/m.  Currently on BiPAP therapy. Will require NIV based on chronic hypercarbia. TOC working on arrangements at [facility] Discussed with respiratory therapy on 11/3 to monitor patient overnight to understand reasons for hypoxia.  Acute on chronic HFpEF. Echocardiogram shows EF of 55 to 60%. Grade 1 diastolic dysfunction. No significant valvular abnormality. Severely volume overloaded. Will aggressively treat with IV diuresis. From prior diuresis patient does have developed some contraction alkalosis. Will treat with Diamox and monitor.  PE. Left leg DVT. Swelling still present. Hypoxia still  present. Started on Lovenox.  Currently on Eliquis.  Continue Eliquis. Given her morbid obesity patient remains at high risk for failure.  Type of diabetes mellitus, uncontrolled with hyperglycemia in the setting of steroids with long-term insulin use. Home regimen includes Amaryl, Jardiance, NovoLog. Currently on Lantus 10 units and sensitive sliding scale. Would recommend restarting Jardiance.  Hyperkalemia.  Hypernatremia Corrected.  Elevated troponin. The setting of PE. Echocardiogram unremarkable for any wall motion abnormality. Monitor.  GERD. PPI.  Continue.  HTN. Sinus bradycardia. Blood pressure elevated with a heart electrocardio Due to swelling of the leg will discontinue amlodipine. Resume losartan at a lower dose. Monitor for now.  Subjective: Denies any acute complaint.  No nausea no vomiting no fever no chills.  No chest pain.  Mentation appears to be adequate although unable to identify daughter.  Daughter reports that patient has not been able to notify her for last 3 days.  Physical Exam: General: in moderate distress, No Rash Cardiovascular: S1 and S2 Present, No Murmur Respiratory: Increased respiratory effort, Bilateral Air entry present.  Basal crackles, No wheezes Abdomen: Bowel Sound present, No tenderness Extremities: Bilateral edema Neuro: Alert and oriented x 2, no new focal deficit, no asterixis  Data Reviewed: I have Reviewed nursing notes, Vitals, and Lab results. Since last encounter, pertinent lab results CBC and BMP   . I have ordered test including CBC and BMP  . I have discussed pt's care plan and test results with PCCM   .  Disposition: Status is: Inpatient Remains inpatient appropriate because: Monitor for improvement in volume status  SCDs Start: 03/14/23 1742 apixaban (ELIQUIS) tablet 5 mg   Family Communication: Daughter at bedside Level of care: Stepdown continue for now Vitals:   03/28/23 0856 03/28/23 1000 03/28/23 1011  03/28/23 1200  BP:  (!) 124/34 Marland Kitchen)  124/34 135/62  Pulse:  (!) 57  (!) 53  Resp:  17  20  Temp: 98 F (36.7 C)     TempSrc: Axillary     SpO2:  97%  99%  Weight:      Height:         Author: Lynden Oxford, MD 03/28/2023 1:46 PM  Please look on [email or web address] to find out who is on call.

## 2023-03-28 NOTE — Progress Notes (Signed)
Patient desatted into the 50s/60s multiple times tonight and in this morning while on BIPAP and had one occurrence of desatting while off the BIPAP. Desats occur while awake and asleep. After a few seconds of being fully aroused, her saturations will come back up into the 90s.

## 2023-03-28 NOTE — Plan of Care (Signed)
  Problem: Clinical Measurements: Goal: Cardiovascular complication will be avoided Outcome: Progressing   Problem: Nutrition: Goal: Adequate nutrition will be maintained Outcome: Progressing   Problem: Elimination: Goal: Will not experience complications related to bowel motility Outcome: Progressing Goal: Will not experience complications related to urinary retention Outcome: Progressing   Problem: Pain Managment: Goal: General experience of comfort will improve Outcome: Progressing   Problem: Safety: Goal: Ability to remain free from injury will improve Outcome: Progressing   Problem: Education: Goal: Knowledge of General Education information will improve Description: Including pain rating scale, medication(s)/side effects and non-pharmacologic comfort measures Outcome: Not Progressing   Problem: Clinical Measurements: Goal: Respiratory complications will improve Outcome: Not Progressing   Problem: Skin Integrity: Goal: Risk for impaired skin integrity will decrease Outcome: Not Progressing

## 2023-03-28 NOTE — Progress Notes (Signed)
   03/28/23 1952  BiPAP/CPAP/SIPAP  Reason BIPAP/CPAP not in use Other(comment) (stand by not ready to wear it yet)  BiPAP/CPAP /SiPAP Vitals  Pulse Rate (!) 56  Resp (!) 23  SpO2 99 %  MEWS Score/Color  MEWS Score 1  MEWS Score Color Green

## 2023-03-29 DIAGNOSIS — J984 Other disorders of lung: Secondary | ICD-10-CM | POA: Diagnosis not present

## 2023-03-29 DIAGNOSIS — J189 Pneumonia, unspecified organism: Secondary | ICD-10-CM | POA: Diagnosis not present

## 2023-03-29 LAB — GLUCOSE, CAPILLARY
Glucose-Capillary: 157 mg/dL — ABNORMAL HIGH (ref 70–99)
Glucose-Capillary: 170 mg/dL — ABNORMAL HIGH (ref 70–99)
Glucose-Capillary: 225 mg/dL — ABNORMAL HIGH (ref 70–99)
Glucose-Capillary: 238 mg/dL — ABNORMAL HIGH (ref 70–99)

## 2023-03-29 LAB — BASIC METABOLIC PANEL
Anion gap: 9 (ref 5–15)
BUN: 43 mg/dL — ABNORMAL HIGH (ref 8–23)
CO2: 33 mmol/L — ABNORMAL HIGH (ref 22–32)
Calcium: 8.5 mg/dL — ABNORMAL LOW (ref 8.9–10.3)
Chloride: 96 mmol/L — ABNORMAL LOW (ref 98–111)
Creatinine, Ser: 0.86 mg/dL (ref 0.44–1.00)
GFR, Estimated: >60 mL/min (ref >60)
Glucose, Bld: 204 mg/dL — ABNORMAL HIGH (ref 70–99)
Potassium: 4.2 mmol/L (ref 3.5–5.1)
Sodium: 138 mmol/L (ref 135–145)

## 2023-03-29 LAB — MAGNESIUM: Magnesium: 2.1 mg/dL (ref 1.7–2.4)

## 2023-03-29 MED ORDER — TORSEMIDE 20 MG PO TABS: 40.0000 mg | ORAL_TABLET | Freq: Every day | ORAL | Status: DC

## 2023-03-29 MED ORDER — ACETAZOLAMIDE 250 MG PO TABS
250.0000 mg | ORAL_TABLET | Freq: Once | ORAL | Status: AC
  Administered 2023-03-29: 250 mg via ORAL
  Filled 2023-03-29: qty 1

## 2023-03-29 MED ORDER — VITAMIN B-12 1000 MCG PO TABS
1000.0000 ug | ORAL_TABLET | Freq: Every day | ORAL | Status: DC
  Administered 2023-03-30 – 2023-04-02 (×4): 1000 ug via ORAL
  Filled 2023-03-29 (×4): qty 1

## 2023-03-29 MED ORDER — FUROSEMIDE 10 MG/ML IJ SOLN
40.0000 mg | Freq: Two times a day (BID) | INTRAMUSCULAR | Status: AC
  Administered 2023-03-29: 40 mg via INTRAVENOUS
  Filled 2023-03-29: qty 4

## 2023-03-29 MED ORDER — TORSEMIDE 20 MG PO TABS
60.0000 mg | ORAL_TABLET | Freq: Every day | ORAL | Status: DC
  Filled 2023-03-29: qty 3

## 2023-03-29 NOTE — Plan of Care (Signed)
  Problem: Clinical Measurements: Goal: Respiratory complications will improve 03/29/2023 0309 by Mikki Harbor, RN Outcome: Progressing 03/29/2023 0306 by Mikki Harbor, RN Outcome: Progressing Goal: Cardiovascular complication will be avoided 03/29/2023 0309 by Mikki Harbor, RN Outcome: Progressing 03/29/2023 0306 by Mikki Harbor, RN Outcome: Progressing   Problem: Nutrition: Goal: Adequate nutrition will be maintained Outcome: Progressing   Problem: Elimination: Goal: Will not experience complications related to bowel motility 03/29/2023 0309 by Mikki Harbor, RN Outcome: Progressing 03/29/2023 0306 by Mikki Harbor, RN Outcome: Progressing Goal: Will not experience complications related to urinary retention 03/29/2023 0309 by Mikki Harbor, RN Outcome: Progressing 03/29/2023 0306 by Mikki Harbor, RN Outcome: Progressing   Problem: Pain Managment: Goal: General experience of comfort will improve 03/29/2023 0309 by Mikki Harbor, RN Outcome: Progressing 03/29/2023 0306 by Mikki Harbor, RN Outcome: Progressing

## 2023-03-29 NOTE — Progress Notes (Signed)
[facility] Progress Note Patient: Kathy Thomas ZOX:[id number] DOB: [birth year] DOA: 03/14/2023  DOS: the patient was seen and examined on 03/29/2023  Brief hospital course: 28 YOF w/ CVA, HTN, T2DM, dementia. 10/20> patient admitted for altered mental status, acute hypoxic on chronic hypercapnic respiratory failure, cavitary pneumonia. CT chest RML pneumonia with cavitation.  CT head unremarkable. 10/21> VQ scan positive for PE.  Started on anticoagulation 10/22> PCCM consult. 10/23> transferred to stepdown for BiPAP in the setting of lethargic and hypercarbia. ongoing delirium, repeat CT head unremarkable.  10/31 PCCM signed off.  Currently requiring IV diuresis.   Assessment and Plan: Aspiration pneumonia with cavitary lesion. Recurrent presentation. PCCM was consulted. Recommend antibiotic for prolonged duration. Currently on 4 weeks course of Omnicef and Flagyl.  Last day 04/10/2023. Completed prednisone taper. Patient will follow-up with PCCM outpatient For a repeat CT chest. SLP following.  Currently on dysphagia 2 diet.  Acute on chronic respiratory failure with hypoxia and hypercarbia. Morbid obesity with obesity hypoventilation syndrome. Undiagnosed OSA,?  Central apnea Body mass index is 49.32 kg/m.  Currently on BiPAP therapy. Will require NIV/BiPAP based on chronic hypercarbia. TOC reports [facility] has arranged BiPAP for the patient. Desats frequently here in the hospital on current BiPAP. Will switch to DreamStation and monitor.  Acute on chronic HFpEF. Echocardiogram shows EF of 55 to 60%. Grade 1 diastolic dysfunction. No significant valvular abnormality. Still with significant volume overload. Continue with IV Lasix. Plan to switch to torsemide tomorrow. Diamox as needed for contraction alkalosis.   PE. Left leg DVT. Swelling still present. Hypoxia still present. Started on Lovenox.  Currently on Eliquis.  Continue Eliquis. Given her morbid  obesity patient remains at high risk for failure.   Type of diabetes mellitus, uncontrolled with hyperglycemia in the setting of steroids with long-term insulin use. Home regimen includes Amaryl, Jardiance, NovoLog. Currently on Lantus 10 units and sensitive sliding scale. Would recommend restarting Jardiance.   Hyperkalemia.  Hypernatremia Corrected.   Elevated troponin. In the setting of PE. Echocardiogram unremarkable for any wall motion abnormality. Monitor.   GERD. PPI.  Continue.   HTN. Sinus bradycardia. Blood pressure was elevated earlier.  Now better. Due to swelling of the leg will discontinue amlodipine. Resume losartan at a lower dose. Monitor for now.   Subjective: No nausea no vomiting.  Somewhat confused.  No fever no chills.  Able to follow commands.  Oral intake adequate.  Unable to tolerate BiPAP last night.  Physical Exam: General: in Mild distress, No Rash Cardiovascular: S1 and S2 Present, No Murmur Respiratory: Good respiratory effort, Bilateral Air entry present. No Crackles, No wheezes Abdomen: Bowel Sound present, No tenderness Extremities: Bilateral improving edema Neuro: Alert and oriented x 2 , no new focal deficit  Data Reviewed: I have Reviewed nursing notes, Vitals, and Lab results. Since last encounter, pertinent lab results magnesium and BMP   . I have ordered test including magnesium and BMP  .  Discussed with RT and TOC also discussed with pulmonary.  Disposition: Status is: Inpatient Remains inpatient appropriate because: Monitor for tolerance of BiPAP therapy in the hospital and improvement in volume status.  SCDs Start: 03/14/23 1742 apixaban (ELIQUIS) tablet 5 mg   Family Communication: Daughter at bedside Level of care: Stepdown continue for now. Vitals:   03/29/23 1300 03/29/23 1400 03/29/23 1500 03/29/23 1600  BP: (!) 117/53 (!) 133/57 (!) 109/37 (!) 114/45  Pulse: (!) 59 62 (!) 57 (!) 58  Resp: (!) 23 19 20  Marland Kitchen)  21  Temp:       TempSrc:      SpO2: 93% 93% 95% 94%  Weight:      Height:        The patient is critically ill with multiple organ systems failure and requires high complexity decision making for assessment and support, frequent evaluation and titration of therapies. Critical Care Time devoted to patient care services described in this note is 35 minutes   Author: Lynden Oxford, MD 03/29/2023 5:14 PM  Please look on [email or web address] to find out who is on call.

## 2023-03-29 NOTE — Progress Notes (Signed)
Speech Language Pathology Treatment: Dysphagia  Patient Details Name: Kathy Thomas MRN: [id number] DOB: Sep 08, 1934 Today's Date: 03/29/2023 Time: 0950-1010 SLP Time Calculation (min) (ACUTE ONLY): 20 min  Assessment / Plan / Recommendation Clinical Impression  Patient seen by SLP for skilled treatment focused on dysphagia goals. Patient was awake, alert, daughter and RN both in room. Daughter reported that patient has been tolerating dysphagia 2 (minced) solids well, but as it limits her PO options, she requested advancing back to dysphagia 3 (mechanical soft). She will provide assistance and supervision with meals. SLP in agreement as daughter provides excellent supervision and assistance with her mother. SLP observed patient with PO intake of meds whole taken with yogurt. She did require liquid sips afterwards as she was noted to have pills remaining on tongue after initial swallows. No overt s/s aspiration observed. SLP advanced diet to dys 3 (mechanical soft) solids and no further skilled treatment indicated at this venue of care.  SLP recommends f/u SLP intervention at next venue of care (likely SNF).    HPI HPI: Patient is a 88 year old female admitted to [facility] long hospital with altered mental status, hypoxia, diagnosed with pneumonia and placed on treatment.  Past medical history positive for COVID-19 a few months ago, dementia, CHF, gait abnormalities.  CT chest showed pulmonary infarct as well as groundglass opacities in the right middle lobe and a hiatal hernia.  Swallow evaluation ordered.  [name], daughter reports patient has been able to feed herself with her right and has not had dysphagia prior to recent illness.      SLP Plan  Discharge SLP treatment due to (comment);All goals met      Recommendations for follow up therapy are one component of a multi-disciplinary discharge planning process, led by the attending physician.  Recommendations may be updated based on patient  status, additional functional criteria and insurance authorization.    Recommendations  Diet recommendations: Dysphagia 3 (mechanical soft);Thin liquid Liquids provided via: Cup;Straw Medication Administration: Whole meds with puree Supervision: Staff to assist with self feeding;Trained caregiver to feed patient Compensations: Slow rate;Small sips/bites;Minimize environmental distractions;Other (Comment) (check for holding/pocketing of PO's/meds) Postural Changes and/or Swallow Maneuvers: Seated upright 90 degrees                  Oral care BID   Frequent or constant Supervision/Assistance Dysphagia, oral phase (R13.11)     Discharge SLP treatment due to (comment);All goals met    Kathy Nevin, MA, CCC-SLP Speech Therapy

## 2023-03-29 NOTE — Progress Notes (Signed)
   03/29/23 2253  BiPAP/CPAP/SIPAP  $ Non-Invasive Home Ventilator  Initial  $ Face Mask Medium Yes  BiPAP/CPAP/SIPAP Pt Type Adult  BiPAP/CPAP/SIPAP DREAMSTATIOND  Mask Type Full face mask  Mask Size Medium  Respiratory Rate 23 breaths/min  IPAP 16 cmH20  EPAP 8 cmH2O  Flow Rate 5 lpm  Patient Home Equipment No  Auto Titrate No  CPAP/SIPAP surface wiped down Yes  BiPAP/CPAP /SiPAP Vitals  Pulse Rate 64  Resp (!) 23  BP (!) 123/43  SpO2 96 %  Bilateral Breath Sounds Diminished  MEWS Score/Color  MEWS Score 1  MEWS Score Color Green

## 2023-03-29 NOTE — TOC Progression Note (Addendum)
Transition of Care [facility]) - Progression Note    Patient Details  Name: Kathy Thomas MRN: [id number] Date of Birth: 09/07/1934  Transition of Care [facility]) CM/SW Contact  Darleene Cleaver, [place] Phone Number: 03/29/2023, 4:53 PM  Clinical Narrative:    Attending physician asked if patient would be able to go to SNF with NIV.  CSW contacted [facility], and they said no they can not accept NIVs for patients, however they have ordered the BIPAP and it is at the SNF.    Patient plans to return back to [facility] once medically ready for discharge.  TOC continuing to follow patient's progress throughout discharge planning.   Expected Discharge Plan: Skilled Nursing Facility Barriers to Discharge: No Barriers Identified  Expected Discharge Plan and Services In-house Referral: NA Discharge Planning Services: NA Post Acute Care Choice: Skilled Nursing Facility Living arrangements for the past 2 months: Skilled Nursing Facility                 DME Arranged: Bipap DME Agency: [employer] Date DME Agency Contacted: 03/19/23 Time DME Agency Contacted: 1213 Representative spoke with at DME Agency: Vaughan Basta HH Arranged:  (NA)           Social Determinants of Health (SDOH) Interventions SDOH Screenings   Food Insecurity: No Food Insecurity (03/14/2023)  Housing: Low Risk  (03/14/2023)  Transportation Needs: No Transportation Needs (03/14/2023)  Utilities: Not At Risk (03/14/2023)  Depression (PHQ2-9): Low Risk  (08/14/2020)  Tobacco Use: Medium Risk (03/14/2023)    Readmission Risk Interventions    03/26/2023    1:59 PM  Readmission Risk Prevention Plan  Transportation Screening Complete  HRI or Home Care Consult Complete  Social Work Consult for Recovery Care Planning/Counseling Complete  Palliative Care Screening Not Applicable  Medication Review Oceanographer) Complete

## 2023-03-30 DIAGNOSIS — J984 Other disorders of lung: Secondary | ICD-10-CM | POA: Diagnosis not present

## 2023-03-30 DIAGNOSIS — J189 Pneumonia, unspecified organism: Secondary | ICD-10-CM | POA: Diagnosis not present

## 2023-03-30 LAB — GLUCOSE, CAPILLARY
Glucose-Capillary: 179 mg/dL — ABNORMAL HIGH (ref 70–99)
Glucose-Capillary: 189 mg/dL — ABNORMAL HIGH (ref 70–99)
Glucose-Capillary: 198 mg/dL — ABNORMAL HIGH (ref 70–99)
Glucose-Capillary: 221 mg/dL — ABNORMAL HIGH (ref 70–99)

## 2023-03-30 LAB — BASIC METABOLIC PANEL
Anion gap: 9 (ref 5–15)
BUN: 52 mg/dL — ABNORMAL HIGH (ref 8–23)
CO2: 34 mmol/L — ABNORMAL HIGH (ref 22–32)
Calcium: 8.7 mg/dL — ABNORMAL LOW (ref 8.9–10.3)
Chloride: 92 mmol/L — ABNORMAL LOW (ref 98–111)
Creatinine, Ser: 1.12 mg/dL — ABNORMAL HIGH (ref 0.44–1.00)
GFR, Estimated: 48 mL/min — ABNORMAL LOW (ref >60)
Glucose, Bld: 167 mg/dL — ABNORMAL HIGH (ref 70–99)
Potassium: 3.3 mmol/L — ABNORMAL LOW (ref 3.5–5.1)
Sodium: 135 mmol/L (ref 135–145)

## 2023-03-30 LAB — MAGNESIUM: Magnesium: 1.8 mg/dL (ref 1.7–2.4)

## 2023-03-30 MED ORDER — ALBUMIN HUMAN 25 % IV SOLN
12.5000 g | Freq: Once | INTRAVENOUS | Status: AC
  Administered 2023-03-30: 12.5 g via INTRAVENOUS
  Filled 2023-03-30: qty 50

## 2023-03-30 MED ORDER — TORSEMIDE 20 MG PO TABS
40.0000 mg | ORAL_TABLET | Freq: Every day | ORAL | Status: DC
  Administered 2023-03-30 – 2023-03-31 (×2): 40 mg via ORAL
  Filled 2023-03-30 (×2): qty 2

## 2023-03-30 MED ORDER — POTASSIUM CHLORIDE CRYS ER 20 MEQ PO TBCR
40.0000 meq | EXTENDED_RELEASE_TABLET | Freq: Once | ORAL | Status: AC
  Administered 2023-03-30: 40 meq via ORAL
  Filled 2023-03-30: qty 2

## 2023-03-30 NOTE — Progress Notes (Addendum)
   03/30/23 2214  BiPAP/CPAP/SIPAP  BiPAP/CPAP/SIPAP Pt Type Adult  BiPAP/CPAP/SIPAP DREAMSTATIOND  Mask Type Full face mask  Mask Size Medium  Respiratory Rate 23 breaths/min  IPAP 16 cmH20  EPAP 10 cmH2O  Flow Rate 5 lpm  Patient Home Equipment No  Auto Titrate No  CPAP/SIPAP surface wiped down Yes  BiPAP/CPAP /SiPAP Vitals  Resp (!) 23  BP (!) 110/54  SpO2 95 %  Bilateral Breath Sounds Diminished   Some redness / skin breakdown noted along center of nose.  Mepilex was applied to the area and along bridge of nose.  RN aware.

## 2023-03-30 NOTE — TOC Progression Note (Signed)
Transition of Care [facility]) - Progression Note   Patient Details  Name: Kathy Thomas MRN: [id number] Date of Birth: [birth year]  Transition of Care [facility]) CM/SW Contact  Ewing Schlein, LCSW Phone Number: 03/30/2023, 12:58 PM  Clinical Narrative: CSW confirmed with [facility] in admissions at [facility] the facility can manage the patient on BiPAP and 5L/min oxygen.  Expected Discharge Plan: Skilled Nursing Facility Barriers to Discharge: No Barriers Identified  Expected Discharge Plan and Services In-house Referral: NA Discharge Planning Services: NA Post Acute Care Choice: Skilled Nursing Facility Living arrangements for the past 2 months: Skilled Nursing Facility           DME Arranged: Bipap DME Agency: [employer] Date DME Agency Contacted: 03/19/23 Time DME Agency Contacted: 1213 Representative spoke with at DME Agency: Vaughan Basta HH Arranged:  (NA)  Social Determinants of Health (SDOH) Interventions SDOH Screenings   Food Insecurity: No Food Insecurity (03/14/2023)  Housing: Low Risk  (03/14/2023)  Transportation Needs: No Transportation Needs (03/14/2023)  Utilities: Not At Risk (03/14/2023)  Depression (PHQ2-9): Low Risk  (08/14/2020)  Tobacco Use: Medium Risk (03/14/2023)   Readmission Risk Interventions    03/26/2023    1:59 PM  Readmission Risk Prevention Plan  Transportation Screening Complete  HRI or Home Care Consult Complete  Social Work Consult for Recovery Care Planning/Counseling Complete  Palliative Care Screening Not Applicable  Medication Review Oceanographer) Complete

## 2023-03-30 NOTE — Progress Notes (Signed)
Physical Therapy Treatment Patient Details Name: Kathy Thomas MRN: [id number] DOB: 1934/08/20 Today's Date: 03/30/2023   History of Present Illness 88 y.o. female with medical history significant of dementia, hypertension who presented to the emergency department due to shortness of breath.  Patient was found to be hypoxic 3 days ago and underwent chest x-ray which showed concern for community-acquired pneumonia.  She was started on ertapenem and received 3 doses.  By the nursing facility she developed tachypnea and shortness of breath so EMS was called to bring her to the emergency department further assessment.  On EMS arrival she was found to be hypoglycemic.  She was brought to the ER where she was hypoxic requiring up to 6 L nasal cannula. Pt was admitted for concerns of cavitary PNA    PT Comments  The patient is awake, follows simple directions with increased time and frequent  cues . Patient  assisted to sitting on bed edge, performed balance activities while in sitting. Continue PT to improve mobility and decrease caregiver burden of care.    If plan is discharge home, recommend the following: A lot of help with bathing/dressing/bathroom;Direct supervision/assist for medications management;Two people to help with walking and/or transfers;Two people to help with bathing/dressing/bathroom   Can travel by private vehicle        Equipment Recommendations  None recommended by PT    Recommendations for Other Services       Precautions / Restrictions Precautions Precautions: Fall Precaution Comments: body habitus, monitor sats Restrictions Other Position/Activity Restrictions: monitor sats     Mobility  Bed Mobility   Bed Mobility: Rolling     Supine to sit: Total assist, +2 for safety/equipment, +2 for physical assistance, HOB elevated Sit to supine: Total assist, +2 for physical assistance, +2 for safety/equipment   General bed mobility comments: assisted legs to bed  edge, bed pad used to move to bed ee, assist trunk to upright with + 2 totoal, + 2 total to retun to supine    Transfers                        Ambulation/Gait                   Stairs             Wheelchair Mobility     Tilt Bed    Modified Rankin (Stroke Patients Only)       Balance Overall balance assessment: Needs assistance Sitting-balance support: Bilateral upper extremity supported, Feet supported Sitting balance-Leahy Scale: Poor Sitting balance - Comments: patient did  sit x ~ 10' listing to left initially whicjh improved  with reaching activities forward and  to right/\. Patient does not demonstrate any carry over, Postural control: Left lateral lean                                  Cognition Arousal: Alert Behavior During Therapy: WFL for tasks assessed/performed Overall Cognitive Status: Within Functional Limits for tasks assessed                                 General Comments: increased alertness this session.  Daughter present and helpful.        Exercises      General Comments        Pertinent Vitals/Pain Pain  Assessment Faces Pain Scale: No hurt    Home Living                          Prior Function            PT Goals (current goals can now be found in the care plan section) Progress towards PT goals: Progressing toward goals    Frequency    Min 1X/week      PT Plan      Co-evaluation PT/OT/SLP Co-Evaluation/Treatment: Yes Reason for Co-Treatment: To address functional/ADL transfers;For patient/therapist safety PT goals addressed during session: Mobility/safety with mobility OT goals addressed during session: ADL's and self-care      AM-PAC PT "6 Clicks" Mobility   Outcome Measure  Help needed turning from your back to your side while in a flat bed without using bedrails?: Total Help needed moving from lying on your back to sitting on the side of a flat  bed without using bedrails?: Total Help needed moving to and from a bed to a chair (including a wheelchair)?: Total Help needed standing up from a chair using your arms (e.g., wheelchair or bedside chair)?: Total Help needed to walk in hospital room?: Total Help needed climbing 3-5 steps with a railing? : Total 6 Click Score: 6    End of Session   Activity Tolerance: Patient tolerated treatment well Patient left: in bed;with call bell/phone within reach;with bed alarm set;with family/visitor present Nurse Communication: Mobility status;Need for lift equipment PT Visit Diagnosis: Other abnormalities of gait and mobility (R26.89)     Time: 1610-9604 PT Time Calculation (min) (ACUTE ONLY): 24 min  Charges:    $Therapeutic Activity: 8-22 mins PT General Charges $$ ACUTE PT VISIT: 1 Visit                     Blanchard Kelch PT Acute Rehabilitation Services Office [phone] Weekend [phone]    Rada Hay 03/30/2023, 2:35 PM

## 2023-03-30 NOTE — Progress Notes (Signed)
[facility] Progress Note Patient: Kathy Thomas:[id number] DOB: Feb 21, 1935 DOA: 03/14/2023  DOS: the patient was seen and examined on 03/30/2023  Brief hospital course: 48 YOF w/ CVA, HTN, T2DM, dementia. 10/20> patient admitted for altered mental status, acute hypoxic on chronic hypercapnic respiratory failure, cavitary pneumonia. CT chest RML pneumonia with cavitation.  CT head unremarkable. 10/21> VQ scan positive for PE.  Started on anticoagulation 10/22> PCCM consult. 10/23> transferred to stepdown for BiPAP in the setting of lethargic and hypercarbia. ongoing delirium, repeat CT head unremarkable.  10/31 PCCM signed off.    Assessment and Plan: Aspiration pneumonia with cavitary lesion. Recurrent presentation. PCCM was consulted. Recommend antibiotic for prolonged duration. Currently on 4 weeks course of Omnicef and Flagyl.  Last day 04/10/2023. Completed prednisone taper. Patient will follow-up with PCCM outpatient For a repeat CT chest. SLP following.  Currently on dysphagia 2 diet.  Acute on chronic respiratory failure with hypoxia and hypercarbia. Morbid obesity with obesity hypoventilation syndrome. Undiagnosed OSA,?  Central apnea Body mass index is 49.32 kg/m.  Currently on BiPAP therapy. Will require NIV/BiPAP based on chronic hypercarbia. TOC reports [facility] has arranged BiPAP for the patient. Desats frequently here in the hospital on current BiPAP. Will switch to DreamStation and monitor.  Acute on chronic HFpEF. Echocardiogram shows EF of 55 to 60%. Grade 1 diastolic dysfunction. No significant valvular abnormality. Still with significant volume overload. Treated with IV Lasix. Plan to switch to torsemide. Diamox as needed for contraction alkalosis.   PE. Left leg DVT. Swelling still present. Hypoxia still present. Started on Lovenox.  Currently on Eliquis.  Continue Eliquis. Given her morbid obesity patient remains at high risk for  failure.   Type of diabetes mellitus, uncontrolled with hyperglycemia in the setting of steroids with long-term insulin use. Home regimen includes Amaryl, Jardiance, NovoLog. Currently on Lantus 10 units and sensitive sliding scale. Would recommend restarting Jardiance.   Hyperkalemia.  Hypernatremia Corrected.   Elevated troponin. In the setting of PE. Echocardiogram unremarkable for any wall motion abnormality. Monitor.   GERD. PPI.  Continue.   HTN. Sinus bradycardia. Blood pressure was elevated earlier.  Now better. Diastolic blood pressure has been running low. Prior echocardiogram did not show any evidence of regurgitation of aortic valve. Due to swelling of the leg will discontinue amlodipine. Hold losartan.   Subjective: No nausea no vomiting no fever no chills.  Mentation better.  Physical Exam: General: in Mild distress, No Rash Cardiovascular: S1 and S2 Present, No Murmur Respiratory: Good respiratory effort, Bilateral Air entry present. No Crackles, No wheezes Abdomen: Bowel Sound present, No tenderness Extremities: Improving edema Neuro: Alert and oriented x3, no new focal deficit  Data Reviewed: I have Reviewed nursing notes, Vitals, and Lab results. Since last encounter, pertinent lab results CBC and BMP   . I have ordered test including CBC and BMP  .   Disposition: Status is: Inpatient Remains inpatient appropriate because: Monitor for stability overnight.  Potentially can discharge back to [facility] tomorrow.  Daughter aware.  SCDs Start: 03/14/23 1742 apixaban (ELIQUIS) tablet 5 mg   Family Communication: Daughter at bedside. Level of care: Stepdown monitor for now. Vitals:   03/30/23 1721 03/30/23 1800 03/30/23 1940 03/30/23 1945  BP: (!) 112/42 (!) 104/33    Pulse: (!) 58 (!) 56    Resp: (!) 22 20    Temp:      TempSrc:      SpO2: 96% 96% 96% 96%  Weight:  Height:         Author: Lynden Oxford, MD 03/30/2023 7:58 PM  Please look on  [email or web address] to find out who is on call.

## 2023-03-30 NOTE — Progress Notes (Signed)
   03/30/23 0324  BiPAP/CPAP/SIPAP  IPAP 16 cmH20  EPAP 10 cmH2O (increased from 8 to 10 for spo2 >=88%, rn aware)  Flow Rate 5 lpm  BiPAP/CPAP /SiPAP Vitals  Pulse Rate (!) 58  Resp (!) 22  SpO2 94 %  MEWS Score/Color  MEWS Score 1  MEWS Score Color Green

## 2023-03-30 NOTE — Progress Notes (Signed)
Occupational Therapy Treatment Patient Details Name: Kathy Thomas MRN: [id number] DOB: 31-Mar-1935 Today's Date: 03/30/2023   History of present illness 88 y.o. female with medical history significant of dementia, hypertension who presented to the emergency department due to shortness of breath.  Patient was found to be hypoxic 3 days ago and underwent chest x-ray which showed concern for community-acquired pneumonia.  She was started on ertapenem and received 3 doses.  By the nursing facility she developed tachypnea and shortness of breath so EMS was called to bring her to the emergency department further assessment.  On EMS arrival she was found to be hypoglycemic.  She was brought to the ER where she was hypoxic requiring up to 6 L nasal cannula. Pt was admitted for concerns of cavitary PNA   OT comments  Patient was able to engage in sitting EOB for about 10 mins to engage in grooming and other therapeutic tasks. Patients daughter was present during session. Patient plans to d/c back to SNF at time of d/c to continue long term care. Patient would continue to benefit from skilled OT services at this time while admitted and after d/c to address noted deficits in order to improve overall safety and independence in ADLs.        If plan is discharge home, recommend the following:  Direct supervision/assist for medications management;Two people to help with walking and/or transfers;A lot of help with bathing/dressing/bathroom   Equipment Recommendations  None recommended by OT       Precautions / Restrictions Precautions Precautions: Fall Precaution Comments: body habitus, monitor sats Restrictions Weight Bearing Restrictions: No Other Position/Activity Restrictions: monitor sats       Mobility Bed Mobility Overal bed mobility: Needs Assistance       Supine to sit: Total assist, +2 for safety/equipment, +2 for physical assistance, HOB elevated Sit to supine: Total assist, +2 for  physical assistance, +2 for safety/equipment   General bed mobility comments: assisted legs to bed edge, bed pad used to move to bed ee, assist trunk to upright with + 2 totoal, + 2 total to retun to supine           Balance Overall balance assessment: Needs assistance Sitting-balance support: Bilateral upper extremity supported, Feet supported Sitting balance-Leahy Scale: Poor Sitting balance - Comments: patient did  sit x ~ 10' listing to left initially whicjh improved  with reaching activities forward and  to right/\. Patient does not demonstrate any carry over,       ADL either performed or assessed with clinical judgement   ADL Overall ADL's : Needs assistance/impaired Eating/Feeding: Set up;Sitting Eating/Feeding Details (indicate cue type and reason): EOB for small sips. Grooming: Brushing hair;Minimal assistance Grooming Details (indicate cue type and reason): with increased education for family on importance of letting patient do what she can for herself.            Cognition Arousal: Alert Behavior During Therapy: WFL for tasks assessed/performed Overall Cognitive Status: History of cognitive impairments - at baseline           General Comments: increased alertness this session.  Daughter present and helpful.                   Pertinent Vitals/ Pain       Pain Assessment Pain Assessment: Faces Faces Pain Scale: No hurt         Frequency  Min 1X/week        Progress Toward Goals  OT  Goals(current goals can now be found in the care plan section)  Progress towards OT goals: Progressing toward goals     Plan      Co-evaluation      Reason for Co-Treatment: To address functional/ADL transfers;For patient/therapist safety PT goals addressed during session: Mobility/safety with mobility OT goals addressed during session: ADL's and self-care      AM-PAC OT "6 Clicks" Daily Activity     Outcome Measure   Help from another person  eating meals?: A Little Help from another person taking care of personal grooming?: A Little Help from another person toileting, which includes using toliet, bedpan, or urinal?: Total Help from another person bathing (including washing, rinsing, drying)?: A Lot Help from another person to put on and taking off regular upper body clothing?: A Lot Help from another person to put on and taking off regular lower body clothing?: Total 6 Click Score: 12    End of Session    OT Visit Diagnosis: Muscle weakness (generalized) (M62.81)   Activity Tolerance Patient tolerated treatment well   Patient Left in bed;with call bell/phone within reach;with bed alarm set;with family/visitor present   Nurse Communication Mobility status        Time: 1040-1105 OT Time Calculation (min): 25 min  Charges: OT Treatments $Self Care/Home Management : 8-22 mins  Rosalio Loud, MS Acute Rehabilitation Department Office# [phone]   Selinda Flavin 03/30/2023, 4:45 PM

## 2023-03-31 DIAGNOSIS — I5033 Acute on chronic diastolic (congestive) heart failure: Secondary | ICD-10-CM

## 2023-03-31 DIAGNOSIS — E870 Hyperosmolality and hypernatremia: Secondary | ICD-10-CM

## 2023-03-31 DIAGNOSIS — J189 Pneumonia, unspecified organism: Secondary | ICD-10-CM | POA: Diagnosis not present

## 2023-03-31 DIAGNOSIS — I2699 Other pulmonary embolism without acute cor pulmonale: Secondary | ICD-10-CM | POA: Diagnosis not present

## 2023-03-31 DIAGNOSIS — E119 Type 2 diabetes mellitus without complications: Secondary | ICD-10-CM

## 2023-03-31 DIAGNOSIS — G4733 Obstructive sleep apnea (adult) (pediatric): Secondary | ICD-10-CM

## 2023-03-31 LAB — BASIC METABOLIC PANEL
Anion gap: 12 (ref 5–15)
BUN: 58 mg/dL — ABNORMAL HIGH (ref 8–23)
CO2: 30 mmol/L (ref 22–32)
Calcium: 8.7 mg/dL — ABNORMAL LOW (ref 8.9–10.3)
Chloride: 95 mmol/L — ABNORMAL LOW (ref 98–111)
Creatinine, Ser: 1.08 mg/dL — ABNORMAL HIGH (ref 0.44–1.00)
GFR, Estimated: 50 mL/min — ABNORMAL LOW (ref >60)
Glucose, Bld: 193 mg/dL — ABNORMAL HIGH (ref 70–99)
Potassium: 3.6 mmol/L (ref 3.5–5.1)
Sodium: 137 mmol/L (ref 135–145)

## 2023-03-31 LAB — GLUCOSE, CAPILLARY
Glucose-Capillary: 169 mg/dL — ABNORMAL HIGH (ref 70–99)
Glucose-Capillary: 160 mg/dL — ABNORMAL HIGH (ref 70–99)
Glucose-Capillary: 266 mg/dL — ABNORMAL HIGH (ref 70–99)
Glucose-Capillary: 262 mg/dL — ABNORMAL HIGH (ref 70–99)

## 2023-03-31 LAB — MAGNESIUM: Magnesium: 1.8 mg/dL (ref 1.7–2.4)

## 2023-03-31 MED ORDER — FUROSEMIDE 40 MG PO TABS: 40.0000 mg | ORAL_TABLET | Freq: Every day | ORAL | Status: DC

## 2023-03-31 NOTE — Progress Notes (Signed)
   03/31/23 2316  BiPAP/CPAP/SIPAP  BiPAP/CPAP/SIPAP Pt Type Adult  BiPAP/CPAP/SIPAP DREAMSTATIOND  Mask Type Full face mask  Mask Size Medium  Respiratory Rate 25 breaths/min  IPAP 16 cmH20  EPAP 10 cmH2O  Flow Rate 5 lpm  Patient Home Equipment No  Auto Titrate No  CPAP/SIPAP surface wiped down Yes  BiPAP/CPAP /SiPAP Vitals  Resp (!) 25  BP (!) 121/50  Bilateral Breath Sounds Diminished   Pt seen, already wearing bipap and tolerating well.  Bipap placed on pt by RN.  Mepilex already applied by RN over skin breakdown / redness on nose.

## 2023-03-31 NOTE — Progress Notes (Addendum)
PROGRESS NOTE    Kathy Thomas  XLK:[id number] DOB: 08-Jun-1934 DOA: 03/14/2023 PCP: Marden Noble, MD (Inactive)   Chief Complaint  Patient presents with   Hypoglycemia   Shortness of Breath    Brief Narrative:  88 y.o. female with medical history significant of dementia, hypertension who presented to the emergency department due to shortness of breath.  Patient was found to be hypoxic 3 days ago and underwent chest x-ray which showed concern for community-acquired pneumonia.  She was started on ertapenem and received 3 doses.  By the nursing facility she developed tachypnea and shortness of breath so EMS was called to bring her to the emergency department further assessment.  On EMS arrival she was found to be hypoglycemic.  She was brought to the ER where she was hypoxic requiring up to 6 L nasal cannula. Pt was admitted for concerns of cavitary PNA   87 YOF w/ CVA, HTN, T2DM, dementia. 10/20> patient admitted for altered mental status, acute hypoxic on chronic hypercapnic respiratory failure, cavitary pneumonia. CT chest RML pneumonia with cavitation.  CT head unremarkable. 10/21> VQ scan positive for PE.  Started on anticoagulation 10/22> PCCM consult. 10/23> transferred to stepdown for BiPAP in the setting of lethargic and hypercarbia. ongoing delirium, repeat CT head unremarkable.  10/31 PCCM signed off.    Assessment & Plan:   Principal Problem:   Cavitary pneumonia Active Problems:   Pulmonary embolism (HCC)   DVT (deep venous thrombosis) (HCC)   DM type 2 (diabetes mellitus, type 2) (HCC)   HTN (hypertension)   Dementia without behavioral disturbance (HCC)   Diabetes mellitus without complication (HCC)   Pressure injury of skin   Hyperkalemia   AKI (acute kidney injury) (HCC)   Oral thrush   Hypernatremia   Dysphagia   OSA (obstructive sleep apnea)   #1 cavitary pneumonia -Patient noted to have presented with altered mental status, tachypnea, worsening  shortness of breath and noted to have been hypoxic 3 days prior to admission underwent chest x-ray concerning for community-acquired pneumonia. -On arrival to the ED patient noted to be hypoxic requiring up to 6 L nasal cannula and admitted for concerns for cavitary pneumonia. -CT chest done with findings notable for a large masslike consolidation of the RML with central groundglass, cavitation, peripheral ring of dense consolidation concerning for pulmonary infarct versus atypical cavitary pneumonia.  Small right pleural effusion noted. -Patient noted to have completed ertapenem prior to admission. -Patient drowsy/lethargic the evening of 03/16/2023 and in the morning of 03/17/2023. -Patient was placed on BiPAP 03/17/2023 with clinical improvement currently on 4 L nasal cannula with sats of 96-98%. -Status post IV albumin and IV Lasix 03/18/2023 with good urine output. -Patient seen in consultation by pulmonary who assessed the patient and recommending continuation of antibiotics of IV Rocephin and Flagyl and recommended total of 4 to 6 weeks of antibiotics await reimaging in the outpatient setting. -Patient transition from IV antibiotics to oral Omnicef and Flagyl to complete a 4-6 week course of treatment EOT 04/24/2023. -Patient assessed by SLP. -Supportive care.  2.  PE/left popliteal DVT -Noted on V/Q scan with findings confirming PE. -Lower extremity Dopplers with left lower extremity DVT. -2D echo with EF of 55 to 60%, mild concentric LVH, grade 1 DD, full study not completed as patient noted to have refused exam. -Was on IV heparin was transitioned to full dose Lovenox and subsequently transitioned to Eliquis on 03/21/2023. -Per pulmonary likely need lifelong DOAC as patient at baseline  with a sedentary lifestyle..  3.  Acute metabolic encephalopathy/acute respiratory failure with hypercarbia/probable undiagnosed OSA/OHS -Likely multifactorial secondary to hypercarbia in the setting of  acute illness of cavitary pneumonia, with prior history of tobacco use. -Patient with no focal neurological deficits however daughter was concerned due to patient's family history and current mental status concern for possible CVA. -CT head done on admission negative for any acute abnormalities. -Repeat head CT negative for any acute abnormalities.. -Ammonia levels slightly elevated at 41 and trended down with lactulose. -ABG with a pH of 7.3/pCO2 of 66/pO2 of 73/bicarb of 32. -Patient with clinical improvement after being placed on BiPAP yesterday with repeat ABG with a pH of 7.29/pCO2 of 64/pO2 of 66 with a bicarb of 31. -Lactulose discontinued.   -Repeat ammonia level at 32..   -BiPAP nightly and as needed. -Continue treatment as in problem #1 and 2. -Continue Pulmicort, Flonase, scheduled DuoNebs, Claritin. -IV Solu-Medrol has been transitioned to oral prednisone taper and patient has completed steroid taper. -BiPAP as needed and nightly. -Patient likely with probable undiagnosed OSA/OHS. -Patient also received IV Lasix with clinical improvement.  -Per PCCM patient will qualify for outpatient NIV by her chronic bicarb elevation. -Frequently in the hospital on BiPAP and was switched to DreamStation overnight and monitored.  -Pulmonary following and feel patient would likely need  NIV at discharge. -TOC reports collapse LTC has arranged BiPAP for patient.  4.  Acute on chronic HFpEF -2D echo with a EF of 55 to 60%, grade 1 diastolic dysfunction, no significant valvular abnormality. -Patient noted to be volume overloaded on exam during the hospitalization and diuresed with IV Lasix. -Patient transition to torsemide 40 mg daily. -Patient will need further bumping BUN and as such we will change torsemide back to home regimen of Lasix 40 mg daily. -Diamox as needed for contraction alkalosis. -  5.  Hyperkalemia -Improved with Lokelma.   -Follow.  6.  AKI -Likely secondary to prerenal  azotemia in the setting of ARB, diuretics.  Patient also noted to be on Jardiance prior to admission. -Urine output of 1.9 L over the past 24 hours. -Patient noted to have received IV albumin and Lasix 40 mg IV x 1 on 03/18/2023.. -Renal function improved.   -Saline lock IV fluids.  -Patient was on IV Lasix and has been transitioned to torsemide. -Will change torsemide to Lasix. -Continue to hold ARB. -Follow-up.  7.  Oral thrush -Clinical improvement.   -Patient currently on nystatin swish and swallow which we will continue to complete a 14-day course of treatment.   8.  Hypertension -BP has improved.   -ARB, diuretics initially held early on during the hospitalization. -IV hydralazine as needed. -Patient with some bradycardia early on and during the hospitalization IV Lopressor was initially held.  -Patient with some bradycardia while sleeping but improved with arousal. -IV Lopressor discontinued patient started on Norvasc 5 mg daily for better blood pressure control.  -Norvasc discontinued. -Patient on torsemide which we will change to Lasix 40 mg daily. -Continue to hold ARB and monitor renal function.  9.  Cognitive impairment -Continue Aricept.  10.  Hypoglycemia/diabetes mellitus type 2 -Patient noted to have received insulin prior to admission at facility. -Hemoglobin A1c 7.1 (03/15/2023) -CBG 169 this morning. -Continue to hold oral hypoglycemic agents of Amaryl. -Patient was on steroids which has been tapered off.  -IV fluids have been saline locked. -Continue Semglee 10 units daily, SSI.  -Jardiance resumed.  12.  Bilateral upper extremity swelling -Likely  secondary to third spacing patient with hypoalbuminemia. -Status post IV albumin, Lasix 40 mg IV x 1 with significant improvement with upper extremity swelling.   -Patient received IV Lasix and has been transition to oral torsemide with improvement with upper extremity swelling. -Will change torsemide to Lasix  40 mg daily which was patient's home dose prior to admission.  13.  Hypernatremia -Resolved with D5W and half-normal saline.   -Monitor with diuretics. -Follow.  14.  Dysphagia -Patient initially was on dysphagia 1 diet, difficulty swallowing initially on presentation was likely due to patient's lethargy. -Patient more alert, reassessed by SLP and diet advanced to a dysphagia 3 diet. -Daughter does state patient occasionally pockets food. -SLP following and appreciate input and recommendations.  15.  Sinus bradycardia -Patient noted bradycardic in the mid to high 40s to 50s while on BiPAP and sleeping however heart rate improved when awake. -Prior 2D echo with no evidence of aortic valve regurgitation.. -Follow.   16.  Pressure injury buttock stage II, POA Pressure Injury 03/14/23 Buttocks Stage 2 -  Partial thickness loss of dermis presenting as a shallow open injury with a red, pink wound bed without slough. (Active)  03/14/23 1830  Location: Buttocks  Location Orientation:   Staging: Stage 2 -  Partial thickness loss of dermis presenting as a shallow open injury with a red, pink wound bed without slough.  Wound Description (Comments):   Present on Admission: Yes       DVT prophylaxis: Eliquis Code Status: Full Family Communication: Updated daughter at bedside.  Disposition: Remain in SDU.  Likely back to SNF when medically stable hopefully in the next 24 hours..    Status is: Inpatient Remains inpatient appropriate because: Severity of illness   Consultants:  PCCM: Dr. Isaiah Serge 03/16/2023  Procedures:  CT head 03/14/2023, 03/17/2023 CT chest 03/14/2023 Left upper extremity Dopplers 03/16/2023 Lower extremity Dopplers 03/16/2023 2D echo 03/15/2023  Antimicrobials:  Anti-infectives (From admission, onward)    Start     Dose/Rate Route Frequency Ordered Stop   03/25/23 0500  metroNIDAZOLE (FLAGYL) tablet 500 mg        500 mg Oral Every 12 hours 03/24/23 1733      03/24/23 2200  clindamycin (CLEOCIN) capsule 300 mg  Status:  Discontinued        300 mg Oral Every 8 hours 03/24/23 1703 03/24/23 1730   03/24/23 2200  cefdinir (OMNICEF) capsule 300 mg        300 mg Oral Every 12 hours 03/24/23 1733     03/16/23 2200  cefTRIAXone (ROCEPHIN) 2 g in sodium chloride 0.9 % 100 mL IVPB  Status:  Discontinued        2 g 200 mL/hr over 30 Minutes Intravenous Every 24 hours 03/16/23 1322 03/24/23 1703   03/16/23 1400  metroNIDAZOLE (FLAGYL) IVPB 500 mg  Status:  Discontinued        500 mg 100 mL/hr over 60 Minutes Intravenous Every 12 hours 03/16/23 1322 03/24/23 1703   03/16/23 0915  ceFEPIme (MAXIPIME) 2 g in sodium chloride 0.9 % 100 mL IVPB  Status:  Discontinued        2 g 200 mL/hr over 30 Minutes Intravenous Every 12 hours 03/16/23 0904 03/16/23 1322   03/15/23 1800  ceFEPIme (MAXIPIME) 2 g in sodium chloride 0.9 % 100 mL IVPB  Status:  Discontinued        2 g 200 mL/hr over 30 Minutes Intravenous Every 24 hours 03/14/23 2010 03/16/23 0904  03/14/23 1954  vancomycin variable dose per unstable renal function (pharmacist dosing)  Status:  Discontinued         Does not apply See admin instructions 03/14/23 1954 03/15/23 0744   03/14/23 1930  vancomycin (VANCOCIN) IVPB 1000 mg/200 mL premix        1,000 mg 200 mL/hr over 60 Minutes Intravenous  Once 03/14/23 1842 03/15/23 0700   03/14/23 1700  vancomycin (VANCOCIN) IVPB 1000 mg/200 mL premix        1,000 mg 200 mL/hr over 60 Minutes Intravenous  Once 03/14/23 1652 03/15/23 0700   03/14/23 1700  ceFEPIme (MAXIPIME) 2 g in sodium chloride 0.9 % 100 mL IVPB        2 g 200 mL/hr over 30 Minutes Intravenous  Once 03/14/23 1652 03/15/23 0700         Subjective: Patient sitting up in bed.  Pleasantly confused.  Denies any chest pain or shortness of breath.  No abdominal pain.  Daughter at bedside.  Noted to have tolerated DreamStation overnight.  Objective: Vitals:   03/31/23 0000 03/31/23 0400 03/31/23  0800 03/31/23 0816  BP: (!) 107/45 (!) 103/40    Pulse: (!) 55 (!) 51    Resp: (!) 23 18    Temp: 98.3 F (36.8 C) 98.2 F (36.8 C) 98.1 F (36.7 C)   TempSrc: Axillary Axillary Axillary   SpO2: 97% 97%  95%  Weight:  118.8 kg    Height:        Intake/Output Summary (Last 24 hours) at 03/31/2023 1052 Last data filed at 03/31/2023 0900 Gross per 24 hour  Intake --  Output 1600 ml  Net -1600 ml   Filed Weights   03/29/23 1751 03/30/23 0500 03/31/23 0400  Weight: 117.7 kg 118.5 kg 118.8 kg    Examination:  General exam: Awake alert.  Oral thrush improved.  Respiratory system: Some scattered bibasilar crackles.  No wheezing.  Fair air movement.  Speaking in full sentences.  Cardiovascular system: Bradycardia.  No JVD.  No murmurs rubs or gallops.  Trace bilateral lower extremity edema.   Gastrointestinal system: Abdomen is soft, obese, nontender, nondistended, positive bowel sounds.  No rebound.  No guarding.  Central nervous system: Alert and oriented.  Moving extremities spontaneously.  No focal neurological deficits. Extremities: Bilateral upper extremities with significantly improved edema.  No significant pitting lower extremity edema. Skin: Ecchymosis on left hand and right hand.  Psychiatry: Judgement and insight poor to fair.  Mood and affect appropriate.     Data Reviewed: I have personally reviewed following labs and imaging studies  CBC: No results for input(s): "WBC", "NEUTROABS", "HGB", "HCT", "MCV", "PLT" in the last 168 hours.   Basic Metabolic Panel: Recent Labs  Lab 03/28/23 0248 03/28/23 1704 03/29/23 0249 03/30/23 0251 03/31/23 0314  NA 139 137 138 135 137  K 3.6 3.6 4.2 3.3* 3.6  CL 97* 95* 96* 92* 95*  CO2 35* 33* 33* 34* 30  GLUCOSE 134* 191* 204* 167* 193*  BUN 39* 38* 43* 52* 58*  CREATININE 0.78 0.68 0.86 1.12* 1.08*  CALCIUM 8.2* 8.5* 8.5* 8.7* 8.7*  MG  --   --  2.1 1.8 1.8    GFR: Estimated Creatinine Clearance: 45.8 mL/min (A)  (by C-G formula based on SCr of 1.08 mg/dL (H)).  Liver Function Tests: No results for input(s): "AST", "ALT", "ALKPHOS", "BILITOT", "PROT", "ALBUMIN" in the last 168 hours.   CBG: Recent Labs  Lab 03/30/23 0755 03/30/23 1218 03/30/23  1656 03/30/23 2157 03/31/23 0738  GLUCAP 179* 189* 198* 221* 169*     No results found for this or any previous visit (from the past 240 hour(s)).        Radiology Studies: No results found.      Scheduled Meds:  apixaban  5 mg Oral BID   budesonide (PULMICORT) nebulizer solution  0.5 mg Nebulization BID   cefdinir  300 mg Oral Q12H   Chlorhexidine Gluconate Cloth  6 each Topical Q2200   vitamin B-12  1,000 mcg Oral Daily   donepezil  10 mg Oral QHS   empagliflozin  10 mg Oral Daily   feeding supplement (GLUCERNA SHAKE)  237 mL Oral BID BM   fluticasone  2 spray Each Nare Daily   [START ON 04/01/2023] furosemide  40 mg Oral Daily   insulin aspart  0-9 Units Subcutaneous TID WC   insulin glargine-yfgn  10 Units Subcutaneous Daily   ipratropium-albuterol  3 mL Nebulization BID   loratadine  10 mg Oral Daily   metroNIDAZOLE  500 mg Oral Q12H   nystatin  5 mL Oral QID   mouth rinse  15 mL Mouth Rinse 4 times per day   pantoprazole  40 mg Oral Daily   polyethylene glycol  17 g Oral BID   Continuous Infusions:     LOS: 17 days    Time spent: 40 minutes    Ramiro Harvest, MD [facility]   To contact the attending provider between 7A-7P or the covering provider during after hours 7P-7A, please log into the web site [email or web address] and access using universal [place] password for that web site. If you do not have the password, please call the hospital operator.  03/31/2023, 10:52 AM

## 2023-03-31 NOTE — TOC Progression Note (Signed)
Transition of Care [facility]) - Progression Note   Patient Details  Name: Kathy Thomas MRN: [id number] Date of Birth: 1934-12-16  [facility]) CM/SW Contact  Ewing Schlein, LCSW Phone Number: 03/31/2023, 12:42 PM  Clinical Narrative: CSW spoke with nurse, Mauri Brooklyn, from [facility] and provided patient's BiPAP setting information.  Expected Discharge Plan: Skilled Nursing Facility Barriers to Discharge: Continued Medical Work up  Expected Discharge Plan and Services In-house Referral: NA Discharge Planning Services: NA Post Acute Care Choice: Skilled Nursing Facility Living arrangements for the past 2 months: Skilled Nursing Facility           DME Arranged: Bipap DME Agency: [employer] Date DME Agency Contacted: 03/19/23 Time DME Agency Contacted: 1213 Representative spoke with at DME Agency: Vaughan Basta HH Arranged:  (NA)  Social Determinants of Health (SDOH) Interventions SDOH Screenings   Food Insecurity: No Food Insecurity (03/14/2023)  Housing: Low Risk  (03/14/2023)  Transportation Needs: No Transportation Needs (03/14/2023)  Utilities: Not At Risk (03/14/2023)  Depression (PHQ2-9): Low Risk  (08/14/2020)  Tobacco Use: Medium Risk (03/14/2023)   Readmission Risk Interventions    03/26/2023    1:59 PM  Readmission Risk Prevention Plan  Transportation Screening Complete  HRI or Home Care Consult Complete  Social Work Consult for Recovery Care Planning/Counseling Complete  Palliative Care Screening Not Applicable  Medication Review Oceanographer) Complete

## 2023-03-31 NOTE — Progress Notes (Signed)
Removed PT from BiPAP and provided scheduled nebulizer treatment.

## 2023-04-01 DIAGNOSIS — E870 Hyperosmolality and hypernatremia: Secondary | ICD-10-CM | POA: Diagnosis not present

## 2023-04-01 DIAGNOSIS — B370 Candidal stomatitis: Secondary | ICD-10-CM

## 2023-04-01 DIAGNOSIS — I2699 Other pulmonary embolism without acute cor pulmonale: Secondary | ICD-10-CM | POA: Diagnosis not present

## 2023-04-01 DIAGNOSIS — E875 Hyperkalemia: Secondary | ICD-10-CM

## 2023-04-01 DIAGNOSIS — F0390 Unspecified dementia without behavioral disturbance: Secondary | ICD-10-CM

## 2023-04-01 DIAGNOSIS — N179 Acute kidney failure, unspecified: Secondary | ICD-10-CM

## 2023-04-01 DIAGNOSIS — E119 Type 2 diabetes mellitus without complications: Secondary | ICD-10-CM

## 2023-04-01 DIAGNOSIS — I5033 Acute on chronic diastolic (congestive) heart failure: Secondary | ICD-10-CM

## 2023-04-01 DIAGNOSIS — J984 Other disorders of lung: Secondary | ICD-10-CM

## 2023-04-01 DIAGNOSIS — I82432 Acute embolism and thrombosis of left popliteal vein: Secondary | ICD-10-CM

## 2023-04-01 DIAGNOSIS — G4733 Obstructive sleep apnea (adult) (pediatric): Secondary | ICD-10-CM

## 2023-04-01 DIAGNOSIS — R1310 Dysphagia, unspecified: Secondary | ICD-10-CM

## 2023-04-01 DIAGNOSIS — J189 Pneumonia, unspecified organism: Secondary | ICD-10-CM

## 2023-04-01 DIAGNOSIS — I10 Essential (primary) hypertension: Secondary | ICD-10-CM

## 2023-04-01 DIAGNOSIS — L89302 Pressure ulcer of unspecified buttock, stage 2: Secondary | ICD-10-CM

## 2023-04-01 LAB — BASIC METABOLIC PANEL
Anion gap: 11 (ref 5–15)
BUN: 62 mg/dL — ABNORMAL HIGH (ref 8–23)
CO2: 33 mmol/L — ABNORMAL HIGH (ref 22–32)
Calcium: 8.8 mg/dL — ABNORMAL LOW (ref 8.9–10.3)
Chloride: 92 mmol/L — ABNORMAL LOW (ref 98–111)
Creatinine, Ser: 1.12 mg/dL — ABNORMAL HIGH (ref 0.44–1.00)
GFR, Estimated: 48 mL/min — ABNORMAL LOW (ref >60)
Glucose, Bld: 212 mg/dL — ABNORMAL HIGH (ref 70–99)
Potassium: 3.7 mmol/L (ref 3.5–5.1)
Sodium: 136 mmol/L (ref 135–145)

## 2023-04-01 LAB — GLUCOSE, CAPILLARY
Glucose-Capillary: 196 mg/dL — ABNORMAL HIGH (ref 70–99)
Glucose-Capillary: 203 mg/dL — ABNORMAL HIGH (ref 70–99)
Glucose-Capillary: 248 mg/dL — ABNORMAL HIGH (ref 70–99)
Glucose-Capillary: 208 mg/dL — ABNORMAL HIGH (ref 70–99)

## 2023-04-01 LAB — CBC
HCT: 43 % (ref 36.0–46.0)
Hemoglobin: 12.2 g/dL (ref 12.0–15.0)
MCH: 25.7 pg — ABNORMAL LOW (ref 26.0–34.0)
MCHC: 28.4 g/dL — ABNORMAL LOW (ref 30.0–36.0)
MCV: 90.7 fL (ref 80.0–100.0)
Platelets: 177 10*3/uL (ref 150–400)
RBC: 4.74 MIL/uL (ref 3.87–5.11)
RDW: 17.7 % — ABNORMAL HIGH (ref 11.5–15.5)
WBC: 8 10*3/uL (ref 4.0–10.5)
nRBC: 0 % (ref 0.0–0.2)

## 2023-04-01 MED ORDER — ACETAZOLAMIDE 250 MG PO TABS
250.0000 mg | ORAL_TABLET | Freq: Two times a day (BID) | ORAL | Status: DC
  Administered 2023-04-01 – 2023-04-02 (×3): 250 mg via ORAL
  Filled 2023-04-01 (×3): qty 1

## 2023-04-01 MED ORDER — FUROSEMIDE 40 MG PO TABS: 40.0000 mg | ORAL_TABLET | Freq: Every day | ORAL | Status: DC

## 2023-04-01 NOTE — Progress Notes (Signed)
   04/01/23 2325  BiPAP/CPAP/SIPAP  BiPAP/CPAP/SIPAP Pt Type Adult  BiPAP/CPAP/SIPAP DREAMSTATIOND  Mask Type Full face mask  Mask Size Medium  IPAP 16 cmH20  EPAP 10 cmH2O  Flow Rate 5 lpm  Patient Home Equipment No  Auto Titrate No  CPAP/SIPAP surface wiped down Yes   RN placed pt. on CPAP/DS, currently tolerating well with 5L Oxygen placed in circuit.

## 2023-04-01 NOTE — Progress Notes (Signed)
PROGRESS NOTE    Kathy Thomas  ZOX:[id number] DOB: [birth year] DOA: 03/14/2023 PCP: Marden Noble, MD (Inactive)   Chief Complaint  Patient presents with   Hypoglycemia   Shortness of Breath    Brief Narrative:  88 y.o. female with medical history significant of dementia, hypertension who presented to the emergency department due to shortness of breath.  Patient was found to be hypoxic 3 days ago and underwent chest x-ray which showed concern for community-acquired pneumonia.  She was started on ertapenem and received 3 doses.  By the nursing facility she developed tachypnea and shortness of breath so EMS was called to bring her to the emergency department further assessment.  On EMS arrival she was found to be hypoglycemic.  She was brought to the ER where she was hypoxic requiring up to 6 L nasal cannula. Pt was admitted for concerns of cavitary PNA   87 YOF w/ CVA, HTN, T2DM, dementia. 10/20> patient admitted for altered mental status, acute hypoxic on chronic hypercapnic respiratory failure, cavitary pneumonia. CT chest RML pneumonia with cavitation.  CT head unremarkable. 10/21> VQ scan positive for PE.  Started on anticoagulation 10/22> PCCM consult. 10/23> transferred to stepdown for BiPAP in the setting of lethargic and hypercarbia. ongoing delirium, repeat CT head unremarkable.  10/31 PCCM signed off.    Assessment & Plan:   Principal Problem:   Cavitary pneumonia Active Problems:   Pulmonary embolism (HCC)   DVT (deep venous thrombosis) (HCC)   DM type 2 (diabetes mellitus, type 2) (HCC)   HTN (hypertension)   Dementia without behavioral disturbance (HCC)   Diabetes mellitus without complication (HCC)   Pressure injury of skin   Hyperkalemia   AKI (acute kidney injury) (HCC)   Oral thrush   Hypernatremia   Dysphagia   OSA (obstructive sleep apnea)   #1 cavitary pneumonia -Patient noted to have presented with altered mental status, tachypnea, worsening  shortness of breath and noted to have been hypoxic 3 days prior to admission underwent chest x-ray concerning for community-acquired pneumonia. -On arrival to the ED patient noted to be hypoxic requiring up to 6 L nasal cannula and admitted for concerns for cavitary pneumonia. -CT chest done with findings notable for a large masslike consolidation of the RML with central groundglass, cavitation, peripheral ring of dense consolidation concerning for pulmonary infarct versus atypical cavitary pneumonia.  Small right pleural effusion noted. -Patient noted to have completed ertapenem prior to admission. -Patient drowsy/lethargic the evening of 03/16/2023 and in the morning of 03/17/2023. -Patient was placed on BiPAP 03/17/2023 with clinical improvement currently on 4 L nasal cannula with sats of 96-98%. -Status post IV albumin and IV Lasix 03/18/2023 with good urine output. -Patient seen in consultation by pulmonary who assessed the patient and recommending continuation of antibiotics of IV Rocephin and Flagyl and recommended total of 4 to 6 weeks of antibiotics await reimaging in the outpatient setting. -Patient transitioned from IV antibiotics to oral Omnicef and Flagyl to complete a 4-6 week course of treatment EOT 04/24/2023. -Patient assessed by SLP. -Supportive care.  2.  PE/left popliteal DVT -Noted on V/Q scan with findings confirming PE. -Lower extremity Dopplers with left lower extremity DVT. -2D echo with EF of 55 to 60%, mild concentric LVH, grade 1 DD, full study not completed as patient noted to have refused exam. -Was on IV heparin was transitioned to full dose Lovenox and subsequently transitioned to Eliquis on 03/21/2023. -Per pulmonary likely need lifelong DOAC as patient at baseline  with a sedentary lifestyle..  3.  Acute metabolic encephalopathy/acute respiratory failure with hypercarbia/probable undiagnosed OSA/OHS -Likely multifactorial secondary to hypercarbia in the setting of  acute illness of cavitary pneumonia, with prior history of tobacco use. -Patient with no focal neurological deficits however daughter was concerned due to patient's family history and current mental status concern for possible CVA. -CT head done on admission negative for any acute abnormalities. -Repeat head CT negative for any acute abnormalities.. -Ammonia levels slightly elevated at 41 and trended down with lactulose. -ABG with a pH of 7.3/pCO2 of 66/pO2 of 73/bicarb of 32. -Patient with clinical improvement after being placed on BiPAP yesterday with repeat ABG with a pH of 7.29/pCO2 of 64/pO2 of 66 with a bicarb of 31. -Lactulose discontinued.   -Repeat ammonia level at 32..   -BiPAP nightly and as needed. -Continue treatment as in problem #1 and 2. -Continue Pulmicort, Flonase, scheduled DuoNebs, Claritin. -IV Solu-Medrol has been transitioned to oral prednisone taper and patient has completed steroid taper. -BiPAP as needed and nightly. -Patient likely with probable undiagnosed OSA/OHS. -Patient also received IV Lasix with clinical improvement.  -Per PCCM patient will qualify for outpatient NIV by her chronic bicarb elevation. -Frequently in the hospital on BiPAP and was switched to [employer] overnight and monitored.  -Pulmonary following and feel patient would likely need  NIV at discharge. -TOC reports collapse LTC has arranged BiPAP for patient.  4.  Acute on chronic HFpEF -2D echo with a EF of 55 to 60%, grade 1 diastolic dysfunction, no significant valvular abnormality. -Patient noted to be volume overloaded on exam during the hospitalization and diuresed with IV Lasix. -Patient transitioned to torsemide 40 mg daily. -Patient due to increasing BUN torsemide was changed to Lasix 40 mg daily.   -Patient however received a dose of torsemide 03/31/2023 and has further increase in BUN and as such we will hold Lasix today and hopefully resume Lasix tomorrow if improvement with BUN or  other decreased dose.  -Diamox as needed for contraction alkalosis. -  5.  Hyperkalemia -Improved with Lokelma.   -Follow.  6.  AKI -Likely secondary to prerenal azotemia in the setting of ARB, diuretics.  Patient also noted to be on Jardiance prior to admission. -Urine output of 1.9 L over the past 24 hours. -Patient noted to have received IV albumin and Lasix 40 mg IV x 1 on 03/18/2023.. -Renal function improved.   -Saline lock IV fluids.  -Patient was on IV Lasix and transitioned to torsemide. -Torsemide transitioned to Lasix which we will hold today. -Continue to hold ARB. -Follow-up.  7.  Oral thrush -Clinical improvement.   -Status post 14-day course of nystatin swish and swallow.   8.  Hypertension -BP has improved.   -ARB, diuretics initially held early on during the hospitalization. -IV hydralazine as needed. -Patient with some bradycardia early on and during the hospitalization IV Lopressor was initially held.  -Patient with some bradycardia while sleeping but improved with arousal. -IV Lopressor discontinued patient started on Norvasc 5 mg daily for better blood pressure control.  -Norvasc discontinued due to lower extremity edema. -Patient was on torsemide and transition to Lasix 40 mg daily.   -Hold Lasix today.  -Continue to hold ARB and monitor renal function.  9.  Cognitive impairment -Aricept.  10.  Hypoglycemia/diabetes mellitus type 2 -Patient noted to have received insulin prior to admission at facility. -Hemoglobin A1c 7.1 (03/15/2023) -CBG 196 this morning. -Continue to hold oral hypoglycemic agents of Amaryl. -Patient was  on steroids which has been tapered off.  -IV fluids have been saline locked. -Continue Semglee 10 units daily, SSI.  -Jardiance resumed.  12.  Bilateral upper extremity swelling -Likely secondary to third spacing patient with hypoalbuminemia. -Status post IV albumin, Lasix 40 mg IV x 1 with significant improvement with upper  extremity swelling.   -Patient received IV Lasix and has been transitioned to oral torsemide with improvement with upper extremity swelling. -Patient received dose of torsemide yesterday, has been changed to Lasix 40 mg daily however due to increased BUN we will hold Lasix today and hopefully resume Lasix tomorrow at 40 mg daily or lower dose.    13.  Hypernatremia -Resolved with D5W and half-normal saline.   -Monitor with diuretics. -Follow.  14.  Dysphagia -Patient initially was on dysphagia 1 diet, difficulty swallowing initially on presentation was likely due to patient's lethargy. -Patient more alert, reassessed by SLP and diet advanced to a dysphagia 3 diet. -Daughter does state patient occasionally pockets food. -SLP following and appreciate input and recommendations.  15.  Sinus bradycardia -Patient noted bradycardic in the mid to high 40s to 50s while on BiPAP and sleeping however heart rate improved when awake. -Prior 2D echo with no evidence of aortic valve regurgitation.. -Follow.   16.  Pressure injury buttock stage II, POA Pressure Injury 03/14/23 Buttocks Stage 2 -  Partial thickness loss of dermis presenting as a shallow open injury with a red, pink wound bed without slough. (Active)  03/14/23 1830  Location: Buttocks  Location Orientation:   Staging: Stage 2 -  Partial thickness loss of dermis presenting as a shallow open injury with a red, pink wound bed without slough.  Wound Description (Comments):   Present on Admission: Yes       DVT prophylaxis: Eliquis Code Status: Full Family Communication: Updated daughter at bedside.  Disposition: Remain in SDU.  Likely back to SNF when medically stable hopefully in the next 24 hours..    Status is: Inpatient Remains inpatient appropriate because: Severity of illness   Consultants:  PCCM: Dr. Isaiah Serge 03/16/2023  Procedures:  CT head 03/14/2023, 03/17/2023 CT chest 03/14/2023 Left upper extremity Dopplers  03/16/2023 Lower extremity Dopplers 03/16/2023 2D echo 03/15/2023  Antimicrobials:  Anti-infectives (From admission, onward)    Start     Dose/Rate Route Frequency Ordered Stop   03/25/23 0500  metroNIDAZOLE (FLAGYL) tablet 500 mg        500 mg Oral Every 12 hours 03/24/23 1733 04/24/23 2359   03/24/23 2200  clindamycin (CLEOCIN) capsule 300 mg  Status:  Discontinued        300 mg Oral Every 8 hours 03/24/23 1703 03/24/23 1730   03/24/23 2200  cefdinir (OMNICEF) capsule 300 mg        300 mg Oral Every 12 hours 03/24/23 1733 04/24/23 2359   03/16/23 2200  cefTRIAXone (ROCEPHIN) 2 g in sodium chloride 0.9 % 100 mL IVPB  Status:  Discontinued        2 g 200 mL/hr over 30 Minutes Intravenous Every 24 hours 03/16/23 1322 03/24/23 1703   03/16/23 1400  metroNIDAZOLE (FLAGYL) IVPB 500 mg  Status:  Discontinued        500 mg 100 mL/hr over 60 Minutes Intravenous Every 12 hours 03/16/23 1322 03/24/23 1703   03/16/23 0915  ceFEPIme (MAXIPIME) 2 g in sodium chloride 0.9 % 100 mL IVPB  Status:  Discontinued        2 g 200 mL/hr over 30 Minutes  Intravenous Every 12 hours 03/16/23 0904 03/16/23 1322   03/15/23 1800  ceFEPIme (MAXIPIME) 2 g in sodium chloride 0.9 % 100 mL IVPB  Status:  Discontinued        2 g 200 mL/hr over 30 Minutes Intravenous Every 24 hours 03/14/23 2010 03/16/23 0904   03/14/23 1954  vancomycin variable dose per unstable renal function (pharmacist dosing)  Status:  Discontinued         Does not apply See admin instructions 03/14/23 1954 03/15/23 0744   03/14/23 1930  vancomycin (VANCOCIN) IVPB 1000 mg/200 mL premix        1,000 mg 200 mL/hr over 60 Minutes Intravenous  Once 03/14/23 1842 03/15/23 0700   03/14/23 1700  vancomycin (VANCOCIN) IVPB 1000 mg/200 mL premix        1,000 mg 200 mL/hr over 60 Minutes Intravenous  Once 03/14/23 1652 03/15/23 0700   03/14/23 1700  ceFEPIme (MAXIPIME) 2 g in sodium chloride 0.9 % 100 mL IVPB        2 g 200 mL/hr over 30 Minutes  Intravenous  Once 03/14/23 1652 03/15/23 0700         Subjective: Patient awake, alert just finished eating her breakfast.  Denies any chest pain or shortness of breath.  No abdominal pain.  Daughter at bedside.   Objective: Vitals:   04/01/23 0400 04/01/23 0500 04/01/23 0600 04/01/23 0745  BP: (!) 120/50  138/85   Pulse: 61 (!) 56 67   Resp: 19 12 (!) 21   Temp:      TempSrc:      SpO2: 96% 91% 93% 96%  Weight:  115.5 kg    Height:        Intake/Output Summary (Last 24 hours) at 04/01/2023 0935 Last data filed at 04/01/2023 0800 Gross per 24 hour  Intake 360 ml  Output 2950 ml  Net -2590 ml   Filed Weights   03/30/23 0500 03/31/23 0400 04/01/23 0500  Weight: 118.5 kg 118.8 kg 115.5 kg    Examination:  General exam: Awake alert.  Oral thrush improved.  Respiratory system: Some scattered coarse breath sounds in the left base.  No wheezing.  Fair air movement.  Speaking in full sentences. Cardiovascular system: RRR no murmurs rubs or gallops.  No JVD.  No lower extremity edema.  Gastrointestinal system: Abdomen is soft, obese, nontender, nondistended, positive bowel sounds.  No rebound.  No guarding.  Central nervous system: Alert and oriented.  Moving extremities spontaneously.  No focal neurological deficits. Extremities: Bilateral upper extremities with significantly improved edema.  No significant pitting lower extremity edema. Skin: Ecchymosis on left hand and right hand.  Psychiatry: Judgement and insight poor to fair.  Mood and affect appropriate.     Data Reviewed: I have personally reviewed following labs and imaging studies  CBC: Recent Labs  Lab 04/01/23 0308  WBC 8.0  HGB 12.2  HCT 43.0  MCV 90.7  PLT 177     Basic Metabolic Panel: Recent Labs  Lab 03/28/23 1704 03/29/23 0249 03/30/23 0251 03/31/23 0314 04/01/23 0308  NA 137 138 135 137 136  K 3.6 4.2 3.3* 3.6 3.7  CL 95* 96* 92* 95* 92*  CO2 33* 33* 34* 30 33*  GLUCOSE 191* 204* 167*  193* 212*  BUN 38* 43* 52* 58* 62*  CREATININE 0.68 0.86 1.12* 1.08* 1.12*  CALCIUM 8.5* 8.5* 8.7* 8.7* 8.8*  MG  --  2.1 1.8 1.8  --     GFR: Estimated  Creatinine Clearance: 43.4 mL/min (A) (by C-G formula based on SCr of 1.12 mg/dL (H)).  Liver Function Tests: No results for input(s): "AST", "ALT", "ALKPHOS", "BILITOT", "PROT", "ALBUMIN" in the last 168 hours.   CBG: Recent Labs  Lab 03/31/23 0738 03/31/23 1218 03/31/23 1614 03/31/23 2244 04/01/23 0816  GLUCAP 169* 160* 266* 262* 196*     No results found for this or any previous visit (from the past 240 hour(s)).        Radiology Studies: No results found.      Scheduled Meds:  acetaZOLAMIDE  250 mg Oral BID   apixaban  5 mg Oral BID   budesonide (PULMICORT) nebulizer solution  0.5 mg Nebulization BID   cefdinir  300 mg Oral Q12H   Chlorhexidine Gluconate Cloth  6 each Topical Q2200   vitamin B-12  1,000 mcg Oral Daily   donepezil  10 mg Oral QHS   empagliflozin  10 mg Oral Daily   feeding supplement (GLUCERNA SHAKE)  237 mL Oral BID BM   fluticasone  2 spray Each Nare Daily   [START ON 04/02/2023] furosemide  40 mg Oral Daily   insulin aspart  0-9 Units Subcutaneous TID WC   insulin glargine-yfgn  10 Units Subcutaneous Daily   ipratropium-albuterol  3 mL Nebulization BID   loratadine  10 mg Oral Daily   metroNIDAZOLE  500 mg Oral Q12H   nystatin  5 mL Oral QID   mouth rinse  15 mL Mouth Rinse 4 times per day   pantoprazole  40 mg Oral Daily   polyethylene glycol  17 g Oral BID   Continuous Infusions:     LOS: 18 days    Time spent: 40 minutes    Ramiro Harvest, MD [facility]   To contact the attending provider between 7A-7P or the covering provider during after hours 7P-7A, please log into the web site [email or web address] and access using universal [place] password for that web site. If you do not have the password, please call the hospital operator.  04/01/2023, 9:35 AM

## 2023-04-01 NOTE — TOC Progression Note (Signed)
Transition of Care [facility]) - Progression Note    Patient Details  Name: Kathy Thomas MRN: [id number] Date of Birth: [birth year]  [facility]) CM/SW Contact  Adrian Prows, RN Phone Number: 04/01/2023, 9:39 AM  Clinical Narrative:    Notified pt approaching readiness for d/c back to facility; she is from [facility]; spoke w/ Joyce Gross, Admissions Director at facility; she says pt can return to facility; she also says pt can not admit over weekend [facility]) because nursing must be in bldg; she also gave RM # [id number], call report # [phone]; Dr Janee Morn notified.   Expected Discharge Plan: Skilled Nursing Facility Barriers to Discharge: Continued Medical Work up  Expected Discharge Plan and Services In-house Referral: NA Discharge Planning Services: NA Post Acute Care Choice: Skilled Nursing Facility Living arrangements for the past 2 months: Skilled Nursing Facility                 DME Arranged: [facility] DME Agency: [employer] Date DME Agency Contacted: 03/19/23 Time DME Agency Contacted: 1213 Representative spoke with at DME Agency: Vaughan Basta HH Arranged:  (NA)           Social Determinants of Health (SDOH) Interventions SDOH Screenings   Food Insecurity: No Food Insecurity (03/14/2023)  Housing: Low Risk  (03/14/2023)  Transportation Needs: No Transportation Needs (03/14/2023)  Utilities: Not At Risk (03/14/2023)  Depression (PHQ2-9): Low Risk  (08/14/2020)  Tobacco Use: Medium Risk (03/14/2023)    Readmission Risk Interventions    03/26/2023    1:59 PM  Readmission Risk Prevention Plan  Transportation Screening Complete  HRI or Home Care Consult Complete  Social Work Consult for Recovery Care Planning/Counseling Complete  Palliative Care Screening Not Applicable  Medication Review Oceanographer) Complete

## 2023-04-02 DIAGNOSIS — E1169 Type 2 diabetes mellitus with other specified complication: Secondary | ICD-10-CM

## 2023-04-02 DIAGNOSIS — E875 Hyperkalemia: Secondary | ICD-10-CM | POA: Diagnosis not present

## 2023-04-02 DIAGNOSIS — J189 Pneumonia, unspecified organism: Secondary | ICD-10-CM | POA: Diagnosis not present

## 2023-04-02 DIAGNOSIS — N179 Acute kidney failure, unspecified: Secondary | ICD-10-CM | POA: Diagnosis not present

## 2023-04-02 LAB — GLUCOSE, CAPILLARY
Glucose-Capillary: 181 mg/dL — ABNORMAL HIGH (ref 70–99)
Glucose-Capillary: 208 mg/dL — ABNORMAL HIGH (ref 70–99)

## 2023-04-02 LAB — BASIC METABOLIC PANEL
Anion gap: 14 (ref 5–15)
BUN: 61 mg/dL — ABNORMAL HIGH (ref 8–23)
CO2: 28 mmol/L (ref 22–32)
Calcium: 8.8 mg/dL — ABNORMAL LOW (ref 8.9–10.3)
Chloride: 93 mmol/L — ABNORMAL LOW (ref 98–111)
Creatinine, Ser: 1.19 mg/dL — ABNORMAL HIGH (ref 0.44–1.00)
GFR, Estimated: 45 mL/min — ABNORMAL LOW (ref >60)
Glucose, Bld: 189 mg/dL — ABNORMAL HIGH (ref 70–99)
Potassium: 3.2 mmol/L — ABNORMAL LOW (ref 3.5–5.1)
Sodium: 135 mmol/L (ref 135–145)

## 2023-04-02 MED ORDER — APIXABAN 5 MG PO TABS: 5.0000 mg | ORAL_TABLET | Freq: Two times a day (BID) | ORAL | Status: AC

## 2023-04-02 MED ORDER — POTASSIUM CHLORIDE 20 MEQ PO PACK
40.0000 meq | PACK | Freq: Once | ORAL | Status: AC
  Administered 2023-04-02: 40 meq via ORAL
  Filled 2023-04-02: qty 2

## 2023-04-02 MED ORDER — FLUCONAZOLE 150 MG PO TABS
150.0000 mg | ORAL_TABLET | Freq: Once | ORAL | Status: AC
  Administered 2023-04-02: 150 mg via ORAL
  Filled 2023-04-02: qty 1

## 2023-04-02 MED ORDER — GLUCERNA SHAKE PO LIQD: 237.0000 mL | Freq: Two times a day (BID) | ORAL | Status: AC

## 2023-04-02 MED ORDER — IPRATROPIUM-ALBUTEROL 0.5-2.5 (3) MG/3ML IN SOLN: 3.0000 mL | Freq: Two times a day (BID) | RESPIRATORY_TRACT | Status: AC

## 2023-04-02 MED ORDER — FLUTICASONE PROPIONATE 50 MCG/ACT NA SUSP: 2.0000 | Freq: Every day | NASAL | Status: AC

## 2023-04-02 MED ORDER — BISACODYL 10 MG RE SUPP: 10.0000 mg | Freq: Every day | RECTAL | Status: AC | PRN

## 2023-04-02 MED ORDER — POTASSIUM CHLORIDE ER 10 MEQ PO TBCR: 10.0000 meq | EXTENDED_RELEASE_TABLET | Freq: Every day | ORAL | Status: AC

## 2023-04-02 MED ORDER — OXYCODONE HCL 5 MG PO TABS: 5.0000 mg | ORAL_TABLET | ORAL | 0 refills | Status: AC | PRN

## 2023-04-02 MED ORDER — CEFDINIR 300 MG PO CAPS: 300.0000 mg | ORAL_CAPSULE | Freq: Two times a day (BID) | ORAL | 0 refills | Status: AC

## 2023-04-02 MED ORDER — BISACODYL 5 MG PO TBEC: 10.0000 mg | DELAYED_RELEASE_TABLET | Freq: Every day | ORAL | Status: AC | PRN

## 2023-04-02 MED ORDER — NYSTATIN 100000 UNIT/GM EX POWD
Freq: Three times a day (TID) | CUTANEOUS | Status: DC
  Filled 2023-04-02: qty 15

## 2023-04-02 MED ORDER — FUROSEMIDE 40 MG PO TABS: 20.0000 mg | ORAL_TABLET | Freq: Every day | ORAL | Status: AC

## 2023-04-02 MED ORDER — POLYETHYLENE GLYCOL 3350 17 G PO PACK: 17.0000 g | PACK | Freq: Every day | ORAL | Status: AC | PRN

## 2023-04-02 MED ORDER — LORATADINE 10 MG PO TABS: 10.0000 mg | ORAL_TABLET | Freq: Every day | ORAL | Status: AC

## 2023-04-02 MED ORDER — FUROSEMIDE 40 MG PO TABS: 40.0000 mg | ORAL_TABLET | Freq: Every day | ORAL | Status: DC

## 2023-04-02 MED ORDER — PANTOPRAZOLE SODIUM 40 MG PO TBEC: 40.0000 mg | DELAYED_RELEASE_TABLET | Freq: Every day | ORAL | Status: AC

## 2023-04-02 MED ORDER — POTASSIUM CHLORIDE CRYS ER 20 MEQ PO TBCR
40.0000 meq | EXTENDED_RELEASE_TABLET | Freq: Once | ORAL | Status: AC
  Administered 2023-04-02: 40 meq via ORAL
  Filled 2023-04-02: qty 2

## 2023-04-02 MED ORDER — METRONIDAZOLE 500 MG PO TABS: 500.0000 mg | ORAL_TABLET | Freq: Two times a day (BID) | ORAL | 0 refills | Status: AC

## 2023-04-02 MED ORDER — FUROSEMIDE 20 MG PO TABS: 20.0000 mg | ORAL_TABLET | Freq: Every day | ORAL | Status: DC

## 2023-04-02 MED ORDER — BUDESONIDE 0.5 MG/2ML IN SUSP: 0.5000 mg | Freq: Two times a day (BID) | RESPIRATORY_TRACT | Status: AC

## 2023-04-02 MED ORDER — ACETAZOLAMIDE 250 MG PO TABS: 250.0000 mg | ORAL_TABLET | Freq: Two times a day (BID) | ORAL | 0 refills | Status: AC

## 2023-04-02 MED ORDER — IPRATROPIUM-ALBUTEROL 0.5-2.5 (3) MG/3ML IN SOLN: 3.0000 mL | RESPIRATORY_TRACT | Status: AC | PRN

## 2023-04-02 MED ORDER — NYSTATIN 100000 UNIT/GM EX POWD: Freq: Three times a day (TID) | CUTANEOUS | Status: AC

## 2023-04-02 MED ORDER — GERHARDT'S BUTT CREAM: 1.0000 | TOPICAL_CREAM | CUTANEOUS | Status: AC | PRN

## 2023-04-02 NOTE — Progress Notes (Signed)
Report was called to [facility], I spoke with Mia RN.

## 2023-04-02 NOTE — TOC Transition Note (Addendum)
Transition of Care [facility]) - CM/SW Discharge Note   Patient Details  Name: LESLI SWARBRICK MRN: [id number] Date of Birth: Sep 20, 1934  [facility]) CM/SW Contact:  Adrian Prows, RN Phone Number: 04/02/2023, 11:04 AM   Clinical Narrative:    D/C orders received; pt will return to [facility]; previously spoke w/ Joyce Gross, Admissions Director at facility; she previously gave assignment RM # [id number], call report # [phone]; pt's dtr [name] notified d/c orders written and pt will transport by [facility]; she says facility does not have full face mask for pt; she says she had been talking w/ facility regarding this; explained that this RN CM will discuss w/ admissions at facility; D/C summary and SNF transfer report sent via SNF hub.  -[phone]- notified by French Ana at facility says there is full face mask at facility; spoke w/ pt's dtr and Lequita Halt, RT in room; they were notified mask available at facility; Lequita Halt also says pt's mask from hospital will also be sent w. Pt; pt's dtr agrees to d/c plan; French Ana at facility notified transport is being called; [facility] called at 1151; spoke w/ Pearne; she says transport should be here within the hour; Morrie Sheldon, RN notified; no TOC needs.  Final next level of care: Skilled Nursing Facility Barriers to Discharge: Continued Medical Work up   Patient Goals and CMS Choice CMS Medicare.gov Compare Post Acute Care list provided to::  (NA) Choice offered to / list presented to : NA  Discharge Placement                Patient chooses bed at: [facility] Patient to be transferred to facility by: [facility] Name of family member notified: [name] (dtr) [phone] Patient and family notified of of transfer: 04/02/23  Discharge Plan and Services Additional resources added to the After Visit Summary for   In-house Referral: NA Discharge Planning Services: NA Post Acute Care Choice: Skilled Nursing Facility          DME Arranged:  [facility] D[facility] Agency: [employer] Date DME Agency Contacted: 03/19/23 Time DME Agency Contacted: 1213 Representative spoke with at DME Agency: Vaughan Basta HH Arranged:  (NA)          Social Determinants of Health (SDOH) Interventions SDOH Screenings   Food Insecurity: No Food Insecurity (03/14/2023)  Housing: Low Risk  (03/14/2023)  Transportation Needs: No Transportation Needs (03/14/2023)  Utilities: Not At Risk (03/14/2023)  Depression (PHQ2-9): Low Risk  (08/14/2020)  Tobacco Use: Medium Risk (03/14/2023)     Readmission Risk Interventions    03/26/2023    1:59 PM  Readmission Risk Prevention Plan  Transportation Screening Complete  HRI or Home Care Consult Complete  Social Work Consult for Recovery Care Planning/Counseling Complete  Palliative Care Screening Not Applicable  Medication Review Oceanographer) Complete

## 2023-04-02 NOTE — Discharge Summary (Signed)
Physician Discharge Summary  Kathy Thomas ZOX:[id number] DOB: 11-14-1934 DOA: 03/14/2023  PCP: Marden Noble, MD (Inactive)  Admit date: 03/14/2023 Discharge date: 04/02/2023  Time spent: 65 minutes  Recommendations for Outpatient Follow-up:  Follow-up with MD at SNF.  Patient will need a basic metabolic profile and magnesium level checked in 4 to 5 days to follow-up on electrolytes, renal function.  Patient's volume status will need to be reassessed as patient was discharged on 20 of Lasix daily and further up titration may be needed and will be deferred to MD at [facility]. Follow-up with Dr. Isaiah Serge, pulmonary in 3 to 4 weeks for follow-up on cavitary pneumonia, PE, acute on chronic hypercarbic respiratory failure, concern for possible undiagnosed OSA.  Will need repeat CT scans done for follow-up on cavitary pneumonia.   Discharge Diagnoses:  Principal Problem:   Cavitary pneumonia Active Problems:   Pulmonary embolism (HCC)   DVT (deep venous thrombosis) (HCC)   DM type 2 (diabetes mellitus, type 2) (HCC)   HTN (hypertension)   Dementia without behavioral disturbance (HCC)   Diabetes mellitus without complication (HCC)   Pressure injury of skin   Hyperkalemia   AKI (acute kidney injury) (HCC)   Oral thrush   Hypernatremia   Dysphagia   OSA (obstructive sleep apnea)   Discharge Condition: Stable and improved.  Diet recommendation: Dysphagia 3 diet with thin liquids  Filed Weights   03/30/23 0500 03/31/23 0400 04/01/23 0500  Weight: 118.5 kg 118.8 kg 115.5 kg    History of present illness:  HPI per Dr. Ladell Heads is a 88 y.o. female with medical history significant of dementia, hypertension who presented to the emergency department due to shortness of breath.  Patient was found to be hypoxic 3 days ago and underwent chest x-ray which showed concern for community-acquired pneumonia.  She was started on ertapenem and received 3 doses.  By the nursing facility she  developed tachypnea and shortness of breath so EMS was called to bring her to the emergency department further assessment.  On EMS arrival she was found to be hypoglycemic.  She was brought to the ER where she was hypoxic requiring up to 6 L nasal cannula.  She was otherwise afebrile and hemodynamically stable.  Labs were obtained on presentation which revealed WBC 13.9, hemoglobin 12.1, platelets 220, INR 1.1, BNP 997, venous blood gas showed pCO2 76, pH 7.25.  Respiratory viral panel was obtained which was negative.  Lactic acid showed 1.5, troponin 319, creatinine 2.2 baseline around 1.  Repeat troponin showed downtrend to 241.  Urinalysis demonstrated no evidence of infection.  Due to patient's failure of ertapenem patient was given vancomycin and cefepime and treated for hospital-acquired pneumonia.  On evaluation she was breathing comfortably on 6 L.  She endorsed cough but denied any shortness of breath or weight gain.  No fevers or chills.   Hospital Course:  #1 cavitary pneumonia -Patient noted to have presented with altered mental status, tachypnea, worsening shortness of breath and noted to have been hypoxic 3 days prior to admission underwent chest x-ray concerning for community-acquired pneumonia. -On arrival to the ED patient noted to be hypoxic requiring up to 6 L nasal cannula and admitted for concerns for cavitary pneumonia. -CT chest done with findings notable for a large masslike consolidation of the RML with central groundglass, cavitation, peripheral ring of dense consolidation concerning for pulmonary infarct versus atypical cavitary pneumonia.  Small right pleural effusion noted. -Patient noted to have completed ertapenem  prior to admission. -Patient drowsy/lethargic the evening of 03/16/2023 and in the morning of 03/17/2023. -Patient was placed on BiPAP 03/17/2023 with clinical improvement currently on 3-4 L nasal cannula with sats of 96-98%. -Status post IV albumin and IV Lasix  03/18/2023 with good urine output. -Patient seen in consultation by pulmonary who assessed the patient and recommending continuation of antibiotics of IV Rocephin and Flagyl and recommended total of 4 to 6 weeks of antibiotics await reimaging in the outpatient setting. -Patient transitioned from IV antibiotics to oral Omnicef and Flagyl to complete a 4-6 week course of treatment EOT 04/24/2023. -Patient assessed by SLP. -Patient improved clinically and be discharged in stable and improved condition.   2.  PE/left popliteal DVT -Noted on V/Q scan with findings confirming PE. -Lower extremity Dopplers with left lower extremity DVT. -2D echo with EF of 55 to 60%, mild concentric LVH, grade 1 DD, full study not completed as patient noted to have refused exam. -Was on IV heparin was transitioned to full dose Lovenox and subsequently transitioned to Eliquis on 03/21/2023. -Per pulmonary likely need lifelong DOAC as patient at baseline with a sedentary lifestyle. -Outpatient follow-up with pulmonary.   3.  Acute metabolic encephalopathy/acute respiratory failure with hypercarbia/probable undiagnosed OSA/OHS -Likely multifactorial secondary to hypercarbia in the setting of acute illness of cavitary pneumonia, with prior history of tobacco use. -Patient with no focal neurological deficits however daughter was concerned due to patient's family history and current mental status concern for possible CVA. -CT head done on admission negative for any acute abnormalities. -Repeat head CT negative for any acute abnormalities.. -Ammonia levels slightly elevated at 41 and trended down with lactulose. -ABG with a pH of 7.3/pCO2 of 66/pO2 of 73/bicarb of 32. -Patient with clinical improvement after being placed on BiPAP with repeat ABG with a pH of 7.29/pCO2 of 64/pO2 of 66 with a bicarb of 31. -Lactulose discontinued.   -Repeat ammonia level at 32..   -BiPAP nightly and as needed. -Continued on treatment as in  problem #1 and 2. -Patient was maintained on Pulmicort, Flonase, scheduled DuoNebs, Claritin. -IV Solu-Medrol has been transitioned to oral prednisone taper and patient has completed steroid taper. -BiPAP as needed and nightly. -Patient likely with probable undiagnosed OSA/OHS. -Patient also received IV Lasix with clinical improvement.  -Per PCCM patient will qualify for outpatient NIV by her chronic bicarb elevation. -Frequently in the hospital on BiPAP and was switched to [employer] overnight and monitored.  -TOC reports [facility] has arranged BiPAP for patient. -Outpatient follow-up with pulmonary, Dr. Isaiah Serge in 3 to 4 weeks.   4.  Acute on chronic HFpEF -2D echo with a EF of 55 to 60%, grade 1 diastolic dysfunction, no significant valvular abnormality. -Patient noted to be volume overloaded on exam during the hospitalization and diuresed with IV Lasix. -Patient transitioned to torsemide 40 mg daily. -Patient due to increasing BUN torsemide was changed to Lasix 40 mg daily.   -Patient however received a dose of torsemide 03/31/2023 and has further increase in BUN and as such Lasix held.   -Lasix will be resumed at 20 mg daily on discharge with outpatient follow-up with PCP/MD at SNF.  -   5.  Hyperkalemia -Resolved on Lokelma.    6.  AKI -Likely secondary to prerenal azotemia in the setting of ARB, diuretics.  Patient also noted to be on Jardiance prior to admission. -Patient noted to have received IV albumin and Lasix 40 mg IV x 1 on 03/18/2023.. -Renal function improved.   -  Patient was on IV Lasix and transitioned to torsemide. -Torsemide transitioned to Lasix which was held due to bump in creatinine and soft blood pressure.   -ARB held and discontinued on discharge.   -Lasix will be resumed post discharge at a dose of 20 mg daily.   -Outpatient follow-up.    7.  Oral thrush -Resolved. -Status post 14-day course of nystatin swish and swallow.    8.  Hypertension -BP has  improved.   -ARB, diuretics initially held early on during the hospitalization. -IV hydralazine as needed. -Patient with some bradycardia early on and during the hospitalization IV Lopressor was initially held.  -Patient with some bradycardia while sleeping but improved with arousal. -IV Lopressor discontinued patient started on Norvasc 5 mg daily for better blood pressure control.  -Norvasc discontinued due to lower extremity edema. -Patient was on torsemide and transitioned to Lasix 40 mg daily.   -Lasix held and will be resumed on discharge at a lower dose of 20 mg daily as blood pressure noted to be soft/borderline. -ARB held and discontinued on discharge. -Outpatient follow-up.   9.  Cognitive impairment -Patient maintained on home regimen Aricept.   10.  Hypoglycemia/diabetes mellitus type 2 -Patient noted to have received insulin prior to admission at facility. -Hemoglobin A1c 7.1 (03/15/2023) -Patient's oral hypoglycemic agents of Amaryl was held during the hospitalization and patient maintained on Semglee 10 units daily as well as SSI.   -Home regimen Jardiance was subsequently resumed. -Patient was discharged back on home regimen of Jardiance, Amaryl and SSI. -Outpatient follow-up with MD at SNF.   12.  Bilateral upper extremity swelling -Likely secondary to third spacing patient with hypoalbuminemia. -Status post IV albumin, Lasix 40 mg IV x 1 with significant improvement with upper extremity swelling.   -Patient received IV Lasix and has been transitioned to oral torsemide with improvement with upper extremity swelling. -Patient received dose of was changed to Lasix 40 mg daily however due to increased BUN Lasix held and dose will be decreased to 20 mg daily on discharge.     13.  Hypernatremia -Resolved with D5W and half-normal saline.     14.  Dysphagia -Patient initially was on dysphagia 1 diet, difficulty swallowing initially on presentation was likely due to  patient's lethargy. -Patient more alert, reassessed by SLP and diet advanced to a dysphagia 3 diet. -Daughter does state patient occasionally pockets food. -SLP followed patient during the hospitalization. -Outpatient follow-up.   15.  Sinus bradycardia -Patient noted bradycardic in the mid to high 40s to 50s while on BiPAP and sleeping however heart rate improved when awake. -Prior 2D echo with no evidence of aortic valve regurgitation.. -Outpatient follow-up.     16.  Pressure injury buttock stage II, POA Pressure Injury 03/14/23 Buttocks Stage 2 -  Partial thickness loss of dermis presenting as a shallow open injury with a red, pink wound bed without slough. (Active)  03/14/23 1830  Location: Buttocks  Location Orientation:   Staging: Stage 2 -  Partial thickness loss of dermis presenting as a shallow open injury with a red, pink wound bed without slough.  Wound Description (Comments):   Present on Admission: Yes         Procedures: CT head 03/14/2023, 03/17/2023 CT chest 03/14/2023 Left upper extremity Dopplers 03/16/2023 Lower extremity Dopplers 03/16/2023 2D echo 03/15/2023  Consultations: PCCM: Dr. Isaiah Serge 03/16/2023    Discharge Exam: Vitals:   04/02/23 0953 04/02/23 0954  BP:    Pulse: 68 69  Resp: 13 20  Temp:    SpO2: (!) 83% (!) 82%    General: NAD. Cardiovascular: RRR no murmurs rubs or gallops.  No JVD.  No lower extremity edema. Respiratory: Improved coarse breath sounds in the bases.  No crackles.  Fair air movement.  Speaking in full sentences.  Discharge Instructions   Discharge Instructions     Ambulatory referral to Pulmonology   Complete by: As directed    Cavitary pneumonia/OSA/chronic respiratory failure with hypercarbia.   Reason for referral: Other   Diet - low sodium heart healthy   Complete by: As directed    Dysphagia 3 diet with thin liquids.   Discharge wound care:   Complete by: As directed    Pressure Injury 03/14/23  Buttocks Stage 2 -  Partial thickness loss of dermis presenting as a shallow open injury with a red, pink wound bed without slough. 18 days      Wound Care Orders (From admission, onward)      Start     Ordered   03/16/23 1000    Foam dressing  Every 3 days     Comments: Silicone foam dressings to the buttock wound,  change every 3 days. ASSESS UNDER dressings each shift for any acute changes in the wounds.  03/15/23 1150   Increase activity slowly   Complete by: As directed       Allergies as of 04/02/2023       Reactions   Codeine Hives   Penicillins    REACTION: rash   Morphine Rash   REACTION: pt unsure of reaction        Medication List     STOP taking these medications    ASPIRIN 81 PO   losartan 50 MG tablet Commonly known as: COZAAR   meloxicam 7.5 MG tablet Commonly known as: MOBIC       TAKE these medications    acetaminophen 500 MG tablet Commonly known as: TYLENOL Take 1,000 mg by mouth every 6 (six) hours as needed for mild pain or headache.   acetaZOLAMIDE 250 MG tablet Commonly known as: DIAMOX Take 1 tablet (250 mg total) by mouth 2 (two) times daily for 2 days.   alendronate 70 MG tablet Commonly known as: FOSAMAX TAKE ONE TABLET BY MOUTH ONCE WEEKLY BEFORE BREAKFAST ON MONDAY   apixaban 5 MG Tabs tablet Commonly known as: ELIQUIS Take 1 tablet (5 mg total) by mouth 2 (two) times daily.   bisacodyl 5 MG EC tablet Commonly known as: DULCOLAX Take 2 tablets (10 mg total) by mouth daily as needed for moderate constipation.   bisacodyl 10 MG suppository Commonly known as: DULCOLAX Place 1 suppository (10 mg total) rectally daily as needed for severe constipation.   budesonide 0.5 MG/2ML nebulizer solution Commonly known as: PULMICORT Take 2 mLs (0.5 mg total) by nebulization 2 (two) times daily.   cefdinir 300 MG capsule Commonly known as: OMNICEF Take 1 capsule (300 mg total) by mouth every 12 (twelve) hours for 22 days.    coal tar 0.5 % shampoo Commonly known as: NEUTROGENA T-GEL Apply 1 Application topically as needed. Monday and thursdays   cyanocobalamin 1000 MCG tablet Take 1 tablet (1,000 mcg total) by mouth daily.   donepezil 10 MG tablet Commonly known as: ARICEPT TAKE ONE TABLET BY MOUTH EVERYDAY AT BEDTIME   feeding supplement (GLUCERNA SHAKE) Liqd Take 237 mLs by mouth 2 (two) times daily between meals.   fluticasone 50 MCG/ACT nasal spray Commonly known  as: FLONASE Place 2 sprays into both nostrils daily. Start taking on: April 03, 2023   furosemide 40 MG tablet Commonly known as: LASIX Take 0.5 tablets (20 mg total) by mouth daily. Start taking on: April 03, 2023 What changed: how much to take   Gerhardt's butt cream Crea Apply 1 Application topically as needed for irritation.   glimepiride 2 MG tablet Commonly known as: AMARYL Take 2 mg by mouth daily.   ipratropium-albuterol 0.5-2.5 (3) MG/3ML Soln Commonly known as: DUONEB Take 3 mLs by nebulization every 2 (two) hours as needed.   ipratropium-albuterol 0.5-2.5 (3) MG/3ML Soln Commonly known as: DUONEB Take 3 mLs by nebulization 2 (two) times daily.   Jardiance 25 MG Tabs tablet Generic drug: empagliflozin Take 25 mg by mouth daily.   ketoconazole 200 MG tablet Commonly known as: NIZORAL Take 200 mg by mouth daily.   loratadine 10 MG tablet Commonly known as: CLARITIN Take 1 tablet (10 mg total) by mouth daily. Start taking on: April 03, 2023   metroNIDAZOLE 500 MG tablet Commonly known as: FLAGYL Take 1 tablet (500 mg total) by mouth every 12 (twelve) hours for 22 days.   NovoLOG FlexPen 100 UNIT/ML FlexPen Generic drug: insulin aspart Inject 0-12 Units into the skin in the morning and at bedtime. Sliding 0-200 =0 units 201-250=4units 251-300=6units 301-350=8units 351-400=10 units 401-550=12 units   nystatin powder Commonly known as: MYCOSTATIN/NYSTOP Apply topically 3 (three) times daily for  7 days.   oxyCODONE 5 MG immediate release tablet Commonly known as: Oxy IR/ROXICODONE Take 1 tablet (5 mg total) by mouth every 4 (four) hours as needed for moderate pain (pain score 4-6).   pantoprazole 40 MG tablet Commonly known as: PROTONIX Take 1 tablet (40 mg total) by mouth daily. Start taking on: April 03, 2023   polyethylene glycol 17 g packet Commonly known as: MIRALAX / GLYCOLAX Take 17 g by mouth daily as needed for mild constipation.   potassium chloride 10 MEQ tablet Commonly known as: KLOR-CON Take 1 tablet (10 mEq total) by mouth daily. Start taking on: April 03, 2023   psyllium 0.52 g capsule Commonly known as: REGULOID Take 0.52 g by mouth daily.   VITAMIN D3 PO Take 1 capsule by mouth daily.               Durable Medical Equipment  (From admission, onward)           Start     Ordered   04/02/23 1020  For home use only DME oxygen  Once       Question Answer Comment  Length of Need Lifetime   Mode or (Route) Nasal cannula   Liters per Minute 4   Frequency Continuous (stationary and portable oxygen unit needed)   Oxygen conserving device Yes   Oxygen delivery system Gas      04/02/23 1019              Discharge Care Instructions  (From admission, onward)           Start     Ordered   04/02/23 0000  Discharge wound care:       Comments: Pressure Injury 03/14/23 Buttocks Stage 2 -  Partial thickness loss of dermis presenting as a shallow open injury with a red, pink wound bed without slough. 18 days      Wound Care Orders (From admission, onward)      Start     Ordered   03/16/23 1000  Foam dressing  Every 3 days     Comments: Silicone foam dressings to the buttock wound,  change every 3 days. ASSESS UNDER dressings each shift for any acute changes in the wounds.  03/15/23 1150   04/02/23 1002           Allergies  Allergen Reactions   Codeine Hives   Penicillins     REACTION: rash   Morphine Rash     REACTION: pt unsure of reaction    Follow-up Information     MD at SNF. Follow up.   Why: Will need BMET and mag levels checked early next week.        Mannam, Praveen, MD. Schedule an appointment as soon as possible for a visit in 3 week(s).   Specialty: Pulmonary Disease Why: Follow-up in 3 to 4 weeks. Contact information: [address] [place] [address] [phone]                  The results of significant diagnostics from this hospitalization (including imaging, microbiology, ancillary and laboratory) are listed below for reference.    Significant Diagnostic Studies: DG CHEST PORT 1 VIEW  Result Date: 03/24/2023 CLINICAL DATA:  Pneumonia. EXAM: PORTABLE CHEST 1 VIEW COMPARISON:  March 14, 2023. FINDINGS: Stable cardiomegaly. Bibasilar opacities are noted concerning for pneumonia or atelectasis. Probable small right pleural effusion. Bony thorax is unremarkable. IMPRESSION: Bibasilar atelectasis or infiltrates are noted with small right pleural effusion. Electronically Signed   By: Lupita Raider M.D.   On: 03/24/2023 13:32   CT HEAD WO CONTRAST ( )  Result Date: 03/17/2023 CLINICAL DATA:  Delirium EXAM: CT HEAD WITHOUT CONTRAST TECHNIQUE: Contiguous axial images were obtained from the base of the skull through the vertex without intravenous contrast. RADIATION DOSE REDUCTION: This exam was performed according to the departmental dose-optimization program which includes automated exposure control, adjustment of the mA and/or kV according to patient size and/or use of iterative reconstruction technique. COMPARISON:  03/14/2023 FINDINGS: Brain: No change or acute finding. Advanced generalized brain volume loss. Widespread chronic small-vessel ischemic changes of the white matter. No sign of acute infarction, mass lesion, hemorrhage, hydrocephalus or extra-axial collection. Vascular: There is atherosclerotic calcification of the major vessels at the base  of the brain. Skull: Negative Sinuses/Orbits: Clear/normal Other: None IMPRESSION: No acute CT finding. Advanced generalized brain volume loss. Widespread chronic small-vessel ischemic changes of the white matter. Electronically Signed   By: Paulina Fusi M.D.   On: 03/17/2023 16:39   VAS [place] LOWER EXTREMITY VENOUS (DVT)  Result Date: 03/16/2023  Lower Venous DVT Study Patient Name:  Kathy Thomas  Date of Exam:   03/16/2023 Medical Rec #: [id number]        Accession #:    [id number] Date of Birth: [birth year]       Patient Gender: F Patient Age:   47 years Exam Location:  [facility] Procedure:      VAS [place] LOWER EXTREMITY VENOUS (DVT) Referring Phys: STEPHEN CHIU --------------------------------------------------------------------------------  Indications: Pulmonary embolism.  Risk Factors: Confirmed PE. Anticoagulation: Heparin. Limitations: Body habitus, poor ultrasound/tissue interface and patient positioning, patient movement, patient pain tolerance, poor patient cooperation. Comparison Study: No prior studies. Performing Technologist: Chanda Busing RVT  Examination Guidelines: A complete evaluation includes B-mode imaging, spectral Doppler, color Doppler, and power Doppler as needed of all accessible portions of each vessel. Bilateral testing is considered an integral part of a complete examination. Limited examinations for reoccurring indications  may be performed as noted. The reflux portion of the exam is performed with the patient in reverse Trendelenburg.  +---------+---------------+---------+-----------+----------+-------------------+ RIGHT    CompressibilityPhasicitySpontaneityPropertiesThrombus Aging      +---------+---------------+---------+-----------+----------+-------------------+ CFV      Full           Yes      Yes                                      +---------+---------------+---------+-----------+----------+-------------------+ SFJ      Full                                                              +---------+---------------+---------+-----------+----------+-------------------+ FV Prox  Full                                                             +---------+---------------+---------+-----------+----------+-------------------+ FV Mid                  Yes      Yes                                      +---------+---------------+---------+-----------+----------+-------------------+ FV Distal               Yes      Yes                                      +---------+---------------+---------+-----------+----------+-------------------+ PFV                                                   Not well visualized +---------+---------------+---------+-----------+----------+-------------------+ POP      Full           Yes      Yes                                      +---------+---------------+---------+-----------+----------+-------------------+ PTV      Full                                                             +---------+---------------+---------+-----------+----------+-------------------+ PERO                                                  Not well visualized +---------+---------------+---------+-----------+----------+-------------------+   +---------+---------------+---------+-----------+----------+-------------------+ LEFT  CompressibilityPhasicitySpontaneityPropertiesThrombus Aging      +---------+---------------+---------+-----------+----------+-------------------+ CFV      Full           Yes      Yes                                      +---------+---------------+---------+-----------+----------+-------------------+ SFJ      Full                                                             +---------+---------------+---------+-----------+----------+-------------------+ FV Prox  Full                                                              +---------+---------------+---------+-----------+----------+-------------------+ FV Mid                  Yes      Yes                                      +---------+---------------+---------+-----------+----------+-------------------+ FV Distal               Yes      Yes                                      +---------+---------------+---------+-----------+----------+-------------------+ PFV                                                   Not well visualized +---------+---------------+---------+-----------+----------+-------------------+ POP      None           No       No                   Acute               +---------+---------------+---------+-----------+----------+-------------------+ PTV      Full                                                             +---------+---------------+---------+-----------+----------+-------------------+ PERO                                                  Not well visualized +---------+---------------+---------+-----------+----------+-------------------+     Summary: RIGHT: - There is no evidence of deep vein thrombosis in the lower extremity. However, portions of this examination were limited- see technologist comments above.  - No cystic structure  found in the popliteal fossa.  LEFT: - Findings consistent with acute deep vein thrombosis involving the left popliteal vein.  - No cystic structure found in the popliteal fossa.  *See table(s) above for measurements and observations. Electronically signed by Lemar Livings MD on 03/16/2023 at 4:36:50 PM.    Final    VAS [place] UPPER EXTREMITY VENOUS DUPLEX  Result Date: 03/16/2023 UPPER VENOUS STUDY  Patient Name:  Kathy Thomas  Date of Exam:   03/16/2023 Medical Rec #: [id number]        Accession #:    [id number] Date of Birth: [birth year]       Patient Gender: F Patient Age:   4 years Exam Location:  [facility] Procedure:      VAS [place] UPPER EXTREMITY VENOUS DUPLEX Referring  Phys: STEPHEN CHIU --------------------------------------------------------------------------------  Indications: Swelling Risk Factors: Confirmed PE. Anticoagulation: Heparin. Limitations: Poor ultrasound/tissue interface, body habitus and patient positioning, patient movement, poor patient cooperation. Comparison Study: No prior studies. Performing Technologist: Chanda Busing RVT  Examination Guidelines: A complete evaluation includes B-mode imaging, spectral Doppler, color Doppler, and power Doppler as needed of all accessible portions of each vessel. Bilateral testing is considered an integral part of a complete examination. Limited examinations for reoccurring indications may be performed as noted.  Right Findings: +----------+------------+---------+-----------+----------+-------+ RIGHT     CompressiblePhasicitySpontaneousPropertiesSummary +----------+------------+---------+-----------+----------+-------+ Subclavian    Full       Yes       Yes                      +----------+------------+---------+-----------+----------+-------+  Left Findings: +----------+------------+---------+-----------+----------+-------+ LEFT      CompressiblePhasicitySpontaneousPropertiesSummary +----------+------------+---------+-----------+----------+-------+ IJV           Full       Yes       Yes                      +----------+------------+---------+-----------+----------+-------+ Subclavian    Full       Yes       Yes                      +----------+------------+---------+-----------+----------+-------+ Axillary      Full       Yes       Yes                      +----------+------------+---------+-----------+----------+-------+ Brachial      Full                                          +----------+------------+---------+-----------+----------+-------+ Radial        Full                                           +----------+------------+---------+-----------+----------+-------+ Ulnar         Full                                          +----------+------------+---------+-----------+----------+-------+ Cephalic      Full                                          +----------+------------+---------+-----------+----------+-------+  Basilic       Full                                          +----------+------------+---------+-----------+----------+-------+  Summary:  Right: No evidence of thrombosis in the subclavian.  Left: No evidence of deep vein thrombosis in the upper extremity. No evidence of superficial vein thrombosis in the upper extremity.  *See table(s) above for measurements and observations.  Diagnosing physician: Lemar Livings MD Electronically signed by Lemar Livings MD on 03/16/2023 at 4:36:44 PM.    Final    NM Pulmonary Perfusion  Result Date: 03/15/2023 CLINICAL DATA:  High probability pulmonary embolism EXAM: NUCLEAR MEDICINE PERFUSION LUNG SCAN TECHNIQUE: Perfusion images were obtained in multiple projections after intravenous injection of radiopharmaceutical. Ventilation scans intentionally deferred if perfusion scan and chest x-ray adequate for interpretation during COVID 19 epidemic. RADIOPHARMACEUTICALS:  4.23 mCi Tc-1m MAA IV COMPARISON:  CT chest 03/14/2023 FINDINGS: There is a segmental perfusion defect within the right middle lobe corresponding to the area of consolidation and cavitation noted on recent CT examination. Additional subsegmental perfusion defects within the left upper lobe anteriorly as well as within the superior segment of the left lower lobe and probable anteromedial segment of the left lower lobe. IMPRESSION: Matched segmental and multiple subsegmental perfusion defects. The examination is positive for pulmonary embolism in the appropriate clinical scenario. These results will be called to the ordering clinician or representative by the Radiologist  Assistant, and communication documented in the PACS or [employer]. Electronically Signed   By: Helyn Numbers M.D.   On: 03/15/2023 19:16   ECHOCARDIOGRAM COMPLETE  Result Date: 03/15/2023    ECHOCARDIOGRAM REPORT   Patient Name:   Kathy Thomas Date of Exam: 03/15/2023 Medical Rec #:  [id number]       Height:       61.0 in Accession #:    [id number]      Weight:       257.1 lb Date of Birth:  [birth year]      BSA:          2.101 m Patient Age:    87 years        BP:           145/92 mmHg Patient Gender: F               HR:           60 bpm. Exam Location:  Inpatient Procedure: 2D Echo, Cardiac Doppler and Color Doppler Indications:    I50.22 CHF  History:        Patient has prior history of Echocardiogram examinations, most                 recent 10/24/2020. Risk Factors:Hypertension, Diabetes,                 Dyslipidemia and Former Smoker.  Sonographer:    Dondra Prader RVT RCS Referring Phys: [id number] [facility]  Sonographer Comments: Patient refused to finish exam IMPRESSIONS  1. Left ventricular ejection fraction, by estimation, is 55 to 60%. The left ventricle has normal function. Left ventricular endocardial border not optimally defined to evaluate regional wall motion. There is mild concentric left ventricular hypertrophy. Left ventricular diastolic parameters are consistent with Grade I diastolic dysfunction (impaired relaxation).  2. Peak RV-RA gradient 25 mmHg. The IVC was not visualized. Right ventricular  systolic function is mildly reduced. The right ventricular size is normal.  3. The mitral valve is normal in structure. No evidence of mitral valve regurgitation. No evidence of mitral stenosis.  4. The aortic valve is tricuspid. There is mild calcification of the aortic valve. Aortic valve regurgitation is not visualized. No aortic stenosis is present.  5. The study was not completed, patient refused full exam. FINDINGS  Left Ventricle: Left ventricular ejection fraction, by estimation, is  55 to 60%. The left ventricle has normal function. Left ventricular endocardial border not optimally defined to evaluate regional wall motion. The left ventricular internal cavity size was normal in size. There is mild concentric left ventricular hypertrophy. Left ventricular diastolic parameters are consistent with Grade I diastolic dysfunction (impaired relaxation). Right Ventricle: Peak RV-RA gradient 25 mmHg. The IVC was not visualized. The right ventricular size is normal. No increase in right ventricular wall thickness. Right ventricular systolic function is mildly reduced. Left Atrium: Left atrial size was normal in size. Right Atrium: Right atrial size was normal in size. Pericardium: There is no evidence of pericardial effusion. Mitral Valve: The mitral valve is normal in structure. No evidence of mitral valve regurgitation. No evidence of mitral valve stenosis. Tricuspid Valve: The tricuspid valve is normal in structure. Tricuspid valve regurgitation is trivial. Aortic Valve: The aortic valve is tricuspid. There is mild calcification of the aortic valve. Aortic valve regurgitation is not visualized. No aortic stenosis is present. Aortic valve mean gradient measures 6.5 mmHg. Aortic valve peak gradient measures 13.2 mmHg. Aortic valve area, by VTI measures 1.65 cm. Pulmonic Valve: The pulmonic valve was normal in structure. Pulmonic valve regurgitation is not visualized. Aorta: The aortic root is normal in size and structure. Venous: The inferior vena cava was not well visualized. IAS/Shunts: No atrial level shunt detected by color flow Doppler.  LEFT VENTRICLE PLAX 2D LVIDd:         4.70 cm LVIDs:         2.60 cm LV PW:         1.30 cm LV IVS:        1.20 cm LVOT diam:     1.60 cm LV SV:         56 LV SV Index:   27 LVOT Area:     2.01 cm  RIGHT VENTRICLE RV S prime:     10.40 cm/s TAPSE (M-mode): 2.1 cm LEFT ATRIUM           Index LA diam:      3.70 cm 1.76 cm/m LA Vol (A4C): 41.4 ml 19.70 ml/m   AORTIC VALVE                     PULMONIC VALVE AV Area (Vmax):    1.66 cm      PV Vmax:       1.07 m/s AV Area (Vmean):   1.61 cm      PV Peak grad:  4.6 mmHg AV Area (VTI):     1.65 cm AV Vmax:           182.00 cm/s AV Vmean:          120.000 cm/s AV VTI:            0.340 m AV Peak Grad:      13.2 mmHg AV Mean Grad:      6.5 mmHg LVOT Vmax:         150.00 cm/s LVOT Vmean:  96.000 cm/s LVOT VTI:          0.279 m LVOT/AV VTI ratio: 0.82  AORTA Ao Root diam: 2.90 cm Ao Asc diam:  3.00 cm MITRAL VALVE               TRICUSPID VALVE MV Area (PHT): 2.66 cm    TR Peak grad:   27.2 mmHg MV Decel Time: 285 msec    TR Vmax:        261.00 cm/s MV E velocity: 69.20 cm/s MV A velocity: 85.10 cm/s  SHUNTS MV E/A ratio:  0.81        Systemic VTI:  0.28 m                            Systemic Diam: 1.60 cm Dalton McleanMD Electronically signed by Wilfred Lacy Signature Date/Time: 03/15/2023/2:17:45 PM    Final    CT Head Wo Contrast  Result Date: 03/14/2023 CLINICAL DATA:  0 Mental status change, unknown cause EXAM: CT HEAD WITHOUT CONTRAST TECHNIQUE: Contiguous axial images were obtained from the base of the skull through the vertex without intravenous contrast. RADIATION DOSE REDUCTION: This exam was performed according to the departmental dose-optimization program which includes automated exposure control, adjustment of the mA and/or kV according to patient size and/or use of iterative reconstruction technique. COMPARISON:  07/05/2014 FINDINGS: Brain: No evidence of acute infarction, hemorrhage, hydrocephalus, extra-axial collection or mass lesion/mass effect. Patchy low-density changes within the periventricular and subcortical white matter most compatible with chronic microvascular ischemic change. Moderate diffuse cerebral volume loss. Vascular: Atherosclerotic calcifications involving the large vessels of the skull base. No unexpected hyperdense vessel. Skull: Normal. Negative for fracture or focal lesion.  Sinuses/Orbits: No acute finding. Other: None. IMPRESSION: 1. No acute intracranial findings. 2. Chronic microvascular ischemic change and cerebral volume loss. Electronically Signed   By: Duanne Guess D.O.   On: 03/14/2023 18:34   CT Chest Wo Contrast  Result Date: 03/14/2023 CLINICAL DATA:  Shortness of breath EXAM: CT CHEST WITHOUT CONTRAST TECHNIQUE: Multidetector CT imaging of the chest was performed following the standard protocol without IV contrast. RADIATION DOSE REDUCTION: This exam was performed according to the departmental dose-optimization program which includes automated exposure control, adjustment of the mA and/or kV according to patient size and/or use of iterative reconstruction technique. COMPARISON:  CT chest angio dated February 01, 2020 FINDINGS: Cardiovascular: Cardiomegaly. No pericardial effusion. Normal caliber thoracic aorta with moderate calcified plaque. Severe coronary artery calcifications. Enlarged pulmonary arteries, main PA measures 3.5 cm. Mediastinum/Nodes: Small hiatal hernia. Thyroid is unremarkable. No enlarged lymph nodes seen in the chest. Lungs/Pleura: Central airways are patent. Large masslike consolidation of the right middle lobe with central ground-glass, cavitation, and peripheral ring of dense consolidation, measures 8.2 x 3.8 cm. Small right pleural effusion. Bibasilar atelectasis. Upper Abdomen: No acute abnormality. Musculoskeletal: No chest wall mass or suspicious bone lesions identified. IMPRESSION: 1. Large masslike consolidation of the right middle lobe with central ground-glass, cavitation, and peripheral ring of dense consolidation, most concerning for pulmonary infarct. Atypical cavitary pneumonia (including fungal etiologies) is also a consideration, especially if patient is immunocompromised. Recommend chest CTA further evaluation and follow-up to resolution. 2. Small right pleural effusion. 3. Enlarged pulmonary arteries, which can be seen in the  setting of pulmonary hypertension. 4. Coronary artery calcifications and aortic Atherosclerosis (ICD10-I70.0). Results were called by telephone at the time of interpretation on 03/14/2023 at 6:18 pm to provider PETER  MESSICK , who verbally acknowledged these results. Electronically Signed   By: Allegra Lai M.D.   On: 03/14/2023 18:19   DG Chest Port 1 View  Result Date: 03/14/2023 CLINICAL DATA:  Shortness of breath EXAM: PORTABLE CHEST 1 VIEW COMPARISON:  01/31/2020 FINDINGS: Cardiomegaly. Aortic atherosclerosis. Low lung volumes. Prominent interstitial markings bilaterally with dense airspace consolidation in the periphery of the right mid lung. Small right and probable trace left pleural effusions. No pneumothorax. IMPRESSION: 1. Dense airspace consolidation in the periphery of the right mid lung suspicious for pneumonia. Radiographic follow-up to resolution is recommended. 2. Cardiomegaly with prominent interstitial markings bilaterally, which may be related to atypical/viral infection or superimposed edema. 3. Small right and probable trace left pleural effusions. Electronically Signed   By: Duanne Guess D.O.   On: 03/14/2023 15:41    Microbiology: No results found for this or any previous visit (from the past 240 hour(s)).   Labs: Basic Metabolic Panel: Recent Labs  Lab 03/29/23 0249 03/30/23 0251 03/31/23 0314 04/01/23 0308 04/02/23 0319  NA 138 135 137 136 135  K 4.2 3.3* 3.6 3.7 3.2*  CL 96* 92* 95* 92* 93*  CO2 33* 34* 30 33* 28  GLUCOSE 204* 167* 193* 212* 189*  BUN 43* 52* 58* 62* 61*  CREATININE 0.86 1.12* 1.08* 1.12* 1.19*  CALCIUM 8.5* 8.7* 8.7* 8.8* 8.8*  MG 2.1 1.8 1.8  --   --    Liver Function Tests: No results for input(s): "AST", "ALT", "ALKPHOS", "BILITOT", "PROT", "ALBUMIN" in the last 168 hours. No results for input(s): "LIPASE", "AMYLASE" in the last 168 hours. No results for input(s): "AMMONIA" in the last 168 hours. CBC: Recent Labs  Lab  04/01/23 0308  WBC 8.0  HGB 12.2  HCT 43.0  MCV 90.7  PLT 177   Cardiac Enzymes: No results for input(s): "CKTOTAL", "CKMB", "CKMBINDEX", "TROPONINI" in the last 168 hours. BNP: BNP (last 3 results) Recent Labs    03/14/23 1350 03/20/23 0307  BNP 997.4* 582.3*    ProBNP (last 3 results) No results for input(s): "PROBNP" in the last 8760 hours.  CBG: Recent Labs  Lab 04/01/23 0816 04/01/23 1140 04/01/23 1630 04/01/23 2110 04/02/23 0747  GLUCAP 196* 203* 248* 208* 181*       Signed:  Ramiro Harvest MD.  [facility] 04/02/2023, 10:22 AM

## 2023-04-02 NOTE — Progress Notes (Signed)
Physical Therapy Treatment Patient Details Name: Kathy Thomas MRN: [id number] DOB: 01/07/1935 Today's Date: 04/02/2023   History of Present Illness 88 y.o. female with medical history significant of dementia, hypertension who presented to the emergency department due to shortness of breath.  Patient was found to be hypoxic 3 days ago and underwent chest x-ray which showed concern for community-acquired pneumonia.  She was started on ertapenem and received 3 doses.  By the nursing facility she developed tachypnea and shortness of breath so EMS was called to bring her to the emergency department further assessment.  On EMS arrival she was found to be hypoglycemic.  She was brought to the ER where she was hypoxic requiring up to 6 L nasal cannula. Pt was admitted for concerns of cavitary PNA    PT Comments  Pt seen in step down ICU RM# [id number] General Comments: AxO x 1 pleasantly confused requiring repeat simple VC's.  Easily distracted. Assisted to EOB was difficult.  General bed mobility comments: assisted legs to bed edge, bed pad used to move to bed plus assist trunk to upright.  Once upright, pt was able to static sit EOB x 8 min at [employer].  General transfer comment: attempted sit to stand + 2 asisst however pt unable to offer any assist 0% so placed Maxi Move LIFT pad under pt  and assisted to recliner via SKI LIFT. Positioned in recliner to comfort.   Pt plans to return to her LTC facility today.      If plan is discharge home, recommend the following: A lot of help with bathing/dressing/bathroom;Direct supervision/assist for medications management;Two people to help with walking and/or transfers;Two people to help with bathing/dressing/bathroom   Can travel by private vehicle     No  Equipment Recommendations  None recommended by PT    Recommendations for Other Services       Precautions / Restrictions Precautions Precautions: Fall Precaution Comments: body habitus,  monitor sats Restrictions Weight Bearing Restrictions: No     Mobility  Bed Mobility Overal bed mobility: Needs Assistance Bed Mobility: Supine to Sit     Supine to sit: Total assist, +2 for safety/equipment, +2 for physical assistance, HOB elevated     General bed mobility comments: assisted legs to bed edge, bed pad used to move to bed ee, assist trunk to upright.  Once upright, pt was able to static sit EOB x 8 min at [employer].    Transfers Overall transfer level: Needs assistance Equipment used: Rolling walker (2 wheels) Transfers: Sit to/from Stand Sit to Stand: Total assist, +2 physical assistance, +2 safety/equipment, From elevated surface           General transfer comment: attempted sit to stand + 2 asisst however pt unable to offer any assist 0% so placed Maxi Move LIFT pad under pt  and assisted to recliner via SKI LIFT. Transfer via Materials engineer Bed    Modified Rankin (Stroke Patients Only)       Balance  Cognition Arousal: Alert                                     General Comments: AxO x 1 pleasantly confused requiring repeat simple VC's.  Easily distracted.        Exercises      General Comments        Pertinent Vitals/Pain Pain Assessment Pain Assessment: No/denies pain    Home Living                          Prior Function            PT Goals (current goals can now be found in the care plan section) Progress towards PT goals: Progressing toward goals    Frequency    Min 1X/week      PT Plan      Co-evaluation              AM-PAC PT "6 Clicks" Mobility   Outcome Measure  Help needed turning from your back to your side while in a flat bed without using bedrails?: Total Help needed moving from lying on  your back to sitting on the side of a flat bed without using bedrails?: Total Help needed moving to and from a bed to a chair (including a wheelchair)?: Total Help needed standing up from a chair using your arms (e.g., wheelchair or bedside chair)?: Total Help needed to walk in hospital room?: Total Help needed climbing 3-5 steps with a railing? : Total 6 Click Score: 6    End of Session Equipment Utilized During Treatment: Gait belt Activity Tolerance: Patient limited by fatigue Patient left: in chair;with call bell/phone within reach;with family/visitor present Nurse Communication: Mobility status;Need for lift equipment PT Visit Diagnosis: Other abnormalities of gait and mobility (R26.89)     Time: 1607-3710 PT Time Calculation (min) (ACUTE ONLY): 32 min  Charges:    $Therapeutic Activity: 23-37 mins PT General Charges $$ ACUTE PT VISIT: 1 Visit                     Felecia Shelling  PTA Acute  Rehabilitation Services Office M-F          [phone]

## 2023-04-05 ENCOUNTER — Telehealth: Payer: Self-pay | Admitting: Pulmonary Disease

## 2023-04-05 NOTE — Telephone Encounter (Signed)
Kathy Thomas states patient needs hospital follow up in 3 to 4 weeks with Dr. Isaiah Serge. Dr. Isaiah Serge first available is January 2025. NP's are out to January 2025. [name] phone number is [phone].

## 2023-04-05 NOTE — Telephone Encounter (Signed)
Called [facility] and spoke with Tresa Endo, advised that the patient can be seen by Dr. Isaiah Serge on 04/26/2023 at 11:30 am for a hospital follow up.  Advised she would need to arrive by 11:15 am for check in.  She verbalized understanding.  Nothing further needed.

## 2023-04-26 ENCOUNTER — Ambulatory Visit (INDEPENDENT_AMBULATORY_CARE_PROVIDER_SITE_OTHER): Payer: Medicare Other | Admitting: Pulmonary Disease

## 2023-04-26 ENCOUNTER — Encounter: Payer: Self-pay | Admitting: Pulmonary Disease

## 2023-04-26 VITALS — BP 120/70 | HR 89 | Ht 62.0 in

## 2023-04-26 DIAGNOSIS — J181 Lobar pneumonia, unspecified organism: Secondary | ICD-10-CM | POA: Diagnosis not present

## 2023-04-26 NOTE — Patient Instructions (Signed)
VISIT SUMMARY:  During your visit today, we reviewed your recent hospitalization for right-sided cavitary pneumonia and blood clots. You have been using BiPAP at night for sleep apnea and have shown good tolerance. Your oxygen levels have been stable during the day without supplemental oxygen. You completed your antibiotics for pneumonia on November 28th and are currently residing in a nursing home with decreased mobility since your hospital stay.  YOUR PLAN:  -CAVITARY PNEUMONIA: Cavitary pneumonia is a type of lung infection that can create cavities in the lung tissue. You have completed your antibiotics, and there are no current signs of respiratory distress. We will order a follow-up CT scan to ensure the pneumonia has resolved.  -PULMONARY EMBOLISM AND LEFT POPLITEAL DVT: A pulmonary embolism is a blood clot in the lungs, and a left popliteal DVT is a blood clot in the leg. You are currently on Eliquis, a blood thinner, to prevent further clotting. There are no signs of new clots or bleeding, so you should continue taking Eliquis as prescribed.  -SLEEP APNEA: Sleep apnea is a condition where breathing stops and starts during sleep. You have been diagnosed with this condition and are using BiPAP at night, which you are tolerating well. Continue using the BiPAP machine every night.  -OXYGEN REQUIREMENT: You have been using supplemental oxygen due to your recent health issues. Your oxygen levels remain stable during the day without it, so you can discontinue daytime oxygen use. However, continue using 5L of oxygen at night with your BiPAP machine and monitor your oxygen levels every 2 hours during the day.  INSTRUCTIONS:  Please follow up in 3 months. We will also schedule a follow-up CT scan to check the resolution of your pneumonia.

## 2023-04-30 ENCOUNTER — Ambulatory Visit (HOSPITAL_COMMUNITY)
Admission: RE | Admit: 2023-04-30 | Discharge: 2023-04-30 | Disposition: A | Discharge status: Home / Self Care | Payer: Medicare Other | Source: Ambulatory Visit | Attending: Pulmonary Disease | Admitting: Pulmonary Disease

## 2023-04-30 DIAGNOSIS — J181 Lobar pneumonia, unspecified organism: Secondary | ICD-10-CM | POA: Diagnosis present

## 2023-05-11 NOTE — Progress Notes (Signed)
Kathy Thomas    [id number]    Dec 08, 1934  Primary Care Physician:Gates, Molly Maduro, MD (Inactive)  Referring Physician: Rodolph Bong, MD [address] [place],  [place] [address]  Chief complaint: Posthospitalization follow-up for pneumonia  HPI: 88 y.o. who  has a past medical history of Acute sinusitis, unspecified (05/28/2008), ANEMIA-IRON DEFICIENCY (10/26/2008), ANXIETY (10/06/2007), CHOLELITHIASIS (10/06/2007), COLONIC POLYPS, HX OF (01/26/2007), DIABETES MELLITUS, TYPE II (10/06/2007), DIVERTICULOSIS, COLON (01/26/2007), ECCHYMOSES, SPONTANEOUS (06/04/2010), GERD (01/26/2007), Heart murmur, HERNIATED DISC (01/26/2007), HYPERLIPIDEMIA (10/06/2007), HYPERTENSION (01/26/2007), LOW BACK PAIN (01/29/2007), MENOPAUSAL DISORDER (10/26/2008), OSA (obstructive sleep apnea) (03/22/2023), OSTEOPOROSIS (06/04/2010), SHOULDER PAIN, LEFT (10/06/2007), and Stroke (HCC).   Discussed the use of AI scribe software for clinical note transcription with the patient, who gave verbal consent to proceed.  The patient, with a history of smoking, was hospitalized in November 2024 for a right-sided cavitary pneumonia and blood clots.  Pulmonary was consulted while she was inpatient. Treated with ceftriaxone, metronidazole which was transitioned to [facility] and Flagyl to complete a 6-week course.  She was discharged on oxygen and Eliquis for the pulmonary embolism diagnosed with a VQ scan and left popliteal DVT. The patient was also started on BiPAP at night due to suspected undiagnosed sleep apnea and hypercarbia. The patient's daughter reports that the patient has been doing well with the BiPAP and has been able to tolerate periods without oxygen during the day, maintaining oxygen saturation in the nineties. The patient completed a course of antibiotics for the pneumonia on November 28th. The patient is currently residing in a nursing home and has had decreased mobility since the hospital stay.  Hospitalization and  nursing home records reviewed in detail.  Pets: Occupation: Exposures: Smoking history: Travel history: Relevant family history   Outpatient Encounter Medications as of 04/26/2023  Medication Sig   acetaminophen (TYLENOL) 500 MG tablet Take 1,000 mg by mouth every 6 (six) hours as needed for mild pain or headache.   alendronate (FOSAMAX) 70 MG tablet TAKE ONE TABLET BY MOUTH ONCE WEEKLY BEFORE BREAKFAST ON MONDAY   apixaban (ELIQUIS) 5 MG TABS tablet Take 1 tablet (5 mg total) by mouth 2 (two) times daily.   bisacodyl (DULCOLAX) 10 MG suppository Place 1 suppository (10 mg total) rectally daily as needed for severe constipation.   bisacodyl (DULCOLAX) 5 MG EC tablet Take 2 tablets (10 mg total) by mouth daily as needed for moderate constipation.   budesonide (PULMICORT) 0.5 MG/2ML nebulizer solution Take 2 mLs (0.5 mg total) by nebulization 2 (two) times daily.   Cholecalciferol (VITAMIN D3 PO) Take 1 capsule by mouth daily.   coal tar (NEUTROGENA T-GEL) 0.5 % shampoo Apply 1 Application topically as needed. Monday and thursdays   donepezil (ARICEPT) 10 MG tablet TAKE ONE TABLET BY MOUTH EVERYDAY AT BEDTIME   feeding supplement, GLUCERNA SHAKE, (GLUCERNA SHAKE) LIQD Take 237 mLs by mouth 2 (two) times daily between meals.   fluticasone (FLONASE) 50 MCG/ACT nasal spray Place 2 sprays into both nostrils daily.   furosemide (LASIX) 40 MG tablet Take 0.5 tablets (20 mg total) by mouth daily.   glimepiride (AMARYL) 2 MG tablet Take 2 mg by mouth daily.   ipratropium-albuterol (DUONEB) 0.5-2.5 (3) MG/3ML SOLN Take 3 mLs by nebulization every 2 (two) hours as needed.   ipratropium-albuterol (DUONEB) 0.5-2.5 (3) MG/3ML SOLN Take 3 mLs by nebulization 2 (two) times daily.   JARDIANCE 25 MG TABS tablet Take 25 mg by mouth  daily.   ketoconazole (NIZORAL) 200 MG tablet Take 200 mg by mouth daily.   loratadine (CLARITIN) 10 MG tablet Take 1 tablet (10 mg total) by mouth daily.   NOVOLOG FLEXPEN 100  UNIT/ML FlexPen Inject 0-12 Units into the skin in the morning and at bedtime. Sliding 0-200 =0 units 201-250=4units 251-300=6units 301-350=8units 351-400=10 units 401-550=12 units   Nystatin (GERHARDT'S BUTT CREAM) CREA Apply 1 Application topically as needed for irritation.   oxyCODONE (OXY IR/ROXICODONE) 5 MG immediate release tablet Take 1 tablet (5 mg total) by mouth every 4 (four) hours as needed for moderate pain (pain score 4-6).   pantoprazole (PROTONIX) 40 MG tablet Take 1 tablet (40 mg total) by mouth daily.   polyethylene glycol (MIRALAX / GLYCOLAX) 17 g packet Take 17 g by mouth daily as needed for mild constipation.   potassium chloride (KLOR-CON) 10 MEQ tablet Take 1 tablet (10 mEq total) by mouth daily.   psyllium (REGULOID) 0.52 g capsule Take 0.52 g by mouth daily.   vitamin B-12 1000 MCG tablet Take 1 tablet (1,000 mcg total) by mouth daily.   acetaZOLAMIDE (DIAMOX) 250 MG tablet Take 1 tablet (250 mg total) by mouth 2 (two) times daily for 2 days.   No facility-administered encounter medications on file as of 04/26/2023.    Allergies as of 04/26/2023 - Review Complete 04/26/2023  Allergen Reaction Noted   Codeine Hives 07/31/2011   Penicillins  11/01/2007   Morphine Rash 11/01/2007    Past Medical History:  Diagnosis Date   Acute sinusitis, unspecified 05/28/2008   ANEMIA-IRON DEFICIENCY 10/26/2008   ANXIETY 10/06/2007   CHOLELITHIASIS 10/06/2007   COLONIC POLYPS, HX OF 01/26/2007   DIABETES MELLITUS, TYPE II 10/06/2007   DIVERTICULOSIS, COLON 01/26/2007   ECCHYMOSES, SPONTANEOUS 06/04/2010   GERD 01/26/2007   Heart murmur    slight heart murmur   HERNIATED DISC 01/26/2007   HYPERLIPIDEMIA 10/06/2007   HYPERTENSION 01/26/2007   LOW BACK PAIN 01/29/2007   MENOPAUSAL DISORDER 10/26/2008   OSA (obstructive sleep apnea) 03/22/2023   OSTEOPOROSIS 06/04/2010   SHOULDER PAIN, LEFT 10/06/2007   Stroke (HCC)    TIA's    Past Surgical History:  Procedure Laterality Date    ABDOMINAL HYSTERECTOMY     APPENDECTOMY     Fracture left  wrist  2010   Dr. Amanda Pea   Herniated disk   1995   C-spine   Left ankle-screw placement  1980   Left elbow-nerve impingement  1985   TONSILLECTOMY      Family History  Problem Relation Age of Onset   Heart disease Father    Hyperlipidemia Other    Stroke Other    Hypertension Other    Esophageal cancer Neg Hx    Colon polyps Neg Hx    Rectal cancer Neg Hx    Stomach cancer Neg Hx     Social History   Socioeconomic History   Marital status: Divorced    Spouse name: Not on file   Number of children: Not on file   Years of education: Not on file   Highest education level: Not on file  Occupational History   Not on file  Tobacco Use   Smoking status: Former    Current packs/day: 0.00    Types: Cigarettes    Quit date: 06/29/2013    Years since quitting: 9.8   Smokeless tobacco: Never  Vaping Use   Vaping status: Never Used  Substance and Sexual Activity   Alcohol use: No  Drug use: No   Sexual activity: Not on file  Other Topics Concern   Not on file  Social History Narrative   Not on file   Social Drivers of Health   Financial Resource Strain: Not on file  Food Insecurity: No Food Insecurity (03/14/2023)   Hunger Vital Sign    Worried About Running Out of Food in the Last Year: Never true    Ran Out of Food in the Last Year: Never true  Transportation Needs: No Transportation Needs (03/14/2023)   PRAPARE - Administrator, Civil Service (Medical): No    Lack of Transportation (Non-Medical): No  Physical Activity: Not on file  Stress: Not on file  Social Connections: Not on file  Intimate Partner Violence: Patient Unable To Answer (03/14/2023)   Humiliation, Afraid, Rape, and Kick questionnaire    Fear of Current or Ex-Partner: Patient unable to answer    Emotionally Abused: Patient unable to answer    Physically Abused: Patient unable to answer    Sexually Abused: Patient unable to  answer    Review of systems: Review of Systems  Constitutional: Negative for fever and chills.  HENT: Negative.   Eyes: Negative for blurred vision.  Respiratory: as per HPI  Cardiovascular: Negative for chest pain and palpitations.  Gastrointestinal: Negative for vomiting, diarrhea, blood per rectum. Genitourinary: Negative for dysuria, urgency, frequency and hematuria.  Musculoskeletal: Negative for myalgias, back pain and joint pain.  Skin: Negative for itching and rash.  Neurological: Negative for dizziness, tremors, focal weakness, seizures and loss of consciousness.  Endo/Heme/Allergies: Negative for environmental allergies.  Psychiatric/Behavioral: Negative for depression, suicidal ideas and hallucinations.  All other systems reviewed and are negative.  Physical Exam: Blood pressure 120/70, pulse 89, height 5\' 2"  (1.575 m), SpO2 95%. Gen:      No acute distress HEENT:  EOMI, sclera anicteric Neck:     No masses; no thyromegaly Lungs:    Clear to auscultation bilaterally; normal respiratory effort CV:         Regular rate and rhythm; no murmurs Abd:      + bowel sounds; soft, non-tender; no palpable masses, no distension Ext:    No edema; adequate peripheral perfusion Skin:      Warm and dry; no rash Neuro: alert and oriented x 3 Psych: normal mood and affect  Data Reviewed: Imaging: CT chest 03/14/2023- large masslike consolidation in the right middle lobe with central groundglass, cavitation, small right effusion VQ scan 03/15/2023-matched segmental and subsegmental perfusion defects positive for pulmonary embolism I have reviewed the images personally.  PFTs:  Labs:  Assessment and Plan Cavitary Pneumonia Completed antibiotics on 04/22/2023. No current signs of respiratory distress. -Order follow-up CT scan to assess resolution of pneumonia.  Pulmonary Embolism and Left Popliteal DVT On Eliquis for anticoagulation. No current signs of clotting or  bleeding. -Continue Eliquis as prescribed.  Sleep Apnea New diagnosis with BiPAP use at night. Tolerating well. -Continue BiPAP at night.  Oxygen Requirement Currently on 2L during the day and 5L at night with BiPAP. SpO2 remains in the 90s during the day without oxygen. -Discontinue daytime oxygen use, continue to monitor SpO2 every 2 hours during the day. -Continue 5L oxygen at night with BiPAP.  Follow-up in 3 months.   Recommendations: CT scan follow-up Continue Eliquis, BiPAP, supplemental oxygen  Chilton Greathouse MD [facility] 05/11/2023, 10:25 AM  CC: Rodolph Bong, MD

## 2023-06-30 ENCOUNTER — Encounter: Payer: Self-pay | Admitting: Pulmonary Disease

## 2023-07-27 ENCOUNTER — Encounter: Payer: Self-pay | Admitting: Pulmonary Disease

## 2023-07-27 ENCOUNTER — Ambulatory Visit: Payer: Medicare Other | Admitting: Pulmonary Disease

## 2023-07-27 VITALS — BP 118/68 | HR 64 | Ht 61.0 in

## 2023-07-27 DIAGNOSIS — Z86711 Personal history of pulmonary embolism: Secondary | ICD-10-CM

## 2023-07-27 DIAGNOSIS — I82432 Acute embolism and thrombosis of left popliteal vein: Secondary | ICD-10-CM

## 2023-07-27 DIAGNOSIS — Z7901 Long term (current) use of anticoagulants: Secondary | ICD-10-CM | POA: Diagnosis not present

## 2023-07-27 DIAGNOSIS — I2699 Other pulmonary embolism without acute cor pulmonale: Secondary | ICD-10-CM

## 2023-07-27 DIAGNOSIS — Z86718 Personal history of other venous thrombosis and embolism: Secondary | ICD-10-CM | POA: Diagnosis not present

## 2023-07-27 DIAGNOSIS — G4733 Obstructive sleep apnea (adult) (pediatric): Secondary | ICD-10-CM

## 2023-07-27 DIAGNOSIS — J181 Lobar pneumonia, unspecified organism: Secondary | ICD-10-CM

## 2023-07-27 NOTE — Progress Notes (Signed)
 Kathy Thomas    [id number]    1934/09/13  Primary Care Physician:Gates, Molly Maduro, MD (Inactive)  Referring Physician: No referring provider defined for this encounter.  Chief complaint: Posthospitalization follow-up for pneumonia  HPI: 89 y.o. who  has a past medical history of Acute sinusitis, unspecified (05/28/2008), ANEMIA-IRON DEFICIENCY (10/26/2008), ANXIETY (10/06/2007), CHOLELITHIASIS (10/06/2007), COLONIC POLYPS, HX OF (01/26/2007), DIABETES MELLITUS, TYPE II (10/06/2007), DIVERTICULOSIS, COLON (01/26/2007), ECCHYMOSES, SPONTANEOUS (06/04/2010), GERD (01/26/2007), Heart murmur, HERNIATED DISC (01/26/2007), HYPERLIPIDEMIA (10/06/2007), HYPERTENSION (01/26/2007), LOW BACK PAIN (01/29/2007), MENOPAUSAL DISORDER (10/26/2008), OSA (obstructive sleep apnea) (03/22/2023), OSTEOPOROSIS (06/04/2010), SHOULDER PAIN, LEFT (10/06/2007), and Stroke (HCC).   Discussed the use of AI scribe software for clinical note transcription with the patient, who gave verbal consent to proceed.  The patient, with a history of smoking, was hospitalized in November 2024 for a right-sided cavitary pneumonia and blood clots.  Pulmonary was consulted while she was inpatient. Treated with ceftriaxone, metronidazole which was transitioned to [facility] and Flagyl to complete a 6-week course.  She was discharged on oxygen and Eliquis for the pulmonary embolism diagnosed with a VQ scan and left popliteal DVT. The patient was also started on BiPAP at night due to suspected undiagnosed sleep apnea and hypercarbia. The patient's daughter reports that the patient has been doing well with the BiPAP and has been able to tolerate periods without oxygen during the day, maintaining oxygen saturation in the nineties. The patient completed a course of antibiotics for the pneumonia on November 28th. The patient is currently residing in a nursing home and has had decreased mobility since the hospital stay.  Hospitalization and nursing home records  reviewed in detail.  Outpatient Encounter Medications as of 07/27/2023  Medication Sig   acetaminophen (TYLENOL) 500 MG tablet Take 1,000 mg by mouth every 6 (six) hours as needed for mild pain or headache.   alendronate (FOSAMAX) 70 MG tablet TAKE ONE TABLET BY MOUTH ONCE WEEKLY BEFORE BREAKFAST ON MONDAY   apixaban (ELIQUIS) 5 MG TABS tablet Take 1 tablet (5 mg total) by mouth 2 (two) times daily.   bisacodyl (DULCOLAX) 10 MG suppository Place 1 suppository (10 mg total) rectally daily as needed for severe constipation.   bisacodyl (DULCOLAX) 5 MG EC tablet Take 2 tablets (10 mg total) by mouth daily as needed for moderate constipation.   budesonide (PULMICORT) 0.5 MG/2ML nebulizer solution Take 2 mLs (0.5 mg total) by nebulization 2 (two) times daily.   Cholecalciferol (VITAMIN D3 PO) Take 1 capsule by mouth daily.   coal tar (NEUTROGENA T-GEL) 0.5 % shampoo Apply 1 Application topically as needed. Monday and thursdays   donepezil (ARICEPT) 10 MG tablet TAKE ONE TABLET BY MOUTH EVERYDAY AT BEDTIME   feeding supplement, GLUCERNA SHAKE, (GLUCERNA SHAKE) LIQD Take 237 mLs by mouth 2 (two) times daily between meals.   fluticasone (FLONASE) 50 MCG/ACT nasal spray Place 2 sprays into both nostrils daily.   furosemide (LASIX) 40 MG tablet Take 0.5 tablets (20 mg total) by mouth daily.   glimepiride (AMARYL) 2 MG tablet Take 2 mg by mouth daily.   ipratropium-albuterol (DUONEB) 0.5-2.5 (3) MG/3ML SOLN Take 3 mLs by nebulization every 2 (two) hours as needed.   ipratropium-albuterol (DUONEB) 0.5-2.5 (3) MG/3ML SOLN Take 3 mLs by nebulization 2 (two) times daily.   JARDIANCE 25 MG TABS tablet Take 25 mg by mouth daily.   ketoconazole (NIZORAL) 200 MG tablet Take 200 mg by mouth daily.   loratadine (CLARITIN) 10  MG tablet Take 1 tablet (10 mg total) by mouth daily.   NOVOLOG FLEXPEN 100 UNIT/ML FlexPen Inject 0-12 Units into the skin in the morning and at bedtime. Sliding 0-200 =0  units 201-250=4units 251-300=6units 301-350=8units 351-400=10 units 401-550=12 units   Nystatin (GERHARDT'S BUTT CREAM) CREA Apply 1 Application topically as needed for irritation.   oxyCODONE (OXY IR/ROXICODONE) 5 MG immediate release tablet Take 1 tablet (5 mg total) by mouth every 4 (four) hours as needed for moderate pain (pain score 4-6).   pantoprazole (PROTONIX) 40 MG tablet Take 1 tablet (40 mg total) by mouth daily.   polyethylene glycol (MIRALAX / GLYCOLAX) 17 g packet Take 17 g by mouth daily as needed for mild constipation.   potassium chloride (KLOR-CON) 10 MEQ tablet Take 1 tablet (10 mEq total) by mouth daily.   psyllium (REGULOID) 0.52 g capsule Take 0.52 g by mouth daily.   vitamin B-12 1000 MCG tablet Take 1 tablet (1,000 mcg total) by mouth daily.   acetaZOLAMIDE (DIAMOX) 250 MG tablet Take 1 tablet (250 mg total) by mouth 2 (two) times daily for 2 days.   No facility-administered encounter medications on file as of 07/27/2023.   Physical Exam: Blood pressure 118/68, pulse 64, height 5\' 1"  (1.549 m), SpO2 98%. Gen:      No acute distress HEENT:  EOMI, sclera anicteric Neck:     No masses; no thyromegaly Lungs:    Clear to auscultation bilaterally; normal respiratory effort CV:         Regular rate and rhythm; no murmurs Abd:      + bowel sounds; soft, non-tender; no palpable masses, no distension Ext:    No edema; adequate peripheral perfusion Skin:      Warm and dry; no rash Neuro: alert and oriented x 3 Psych: normal mood and affect   Data Reviewed: Imaging: CT chest 03/14/2023- large masslike consolidation in the right middle lobe with central groundglass, cavitation, small right effusion VQ scan 03/15/2023-matched segmental and subsegmental perfusion defects positive for pulmonary embolism CT chest 04/30/2023-  I have reviewed the images personally.  PFTs:  Labs:  Assessment and Plan Pneumonia Recent episode diagnosed via chest x-ray, with symptoms  including coughing. Responded well to antibiotics. Previous CT scan in December showed improvement but residual scarring. Follow-up CT scan in six months to monitor scarring and ensure continued improvement. - Order CT scan in six months.  Pulmonary Embolism (PE), Lt DVT History of PE during previous hospitalization. Currently on long-term Eliquis. Discussed potential dosage reduction after six to eight months, weighing clot recurrence risk against bleeding risk. Emphasized risk-benefit assessment before dosage changes. - Continue Eliquis for six to eight months. - Reassess dosage reduction after six to eight months.  Sleep Apnea Uses BiPAP at night. Concerns about removing BiPAP and oxygen therapy during the night. Discussed monitoring daytime oxygen levels and weaning off oxygen if levels remain above 88-90%. - Continue BiPAP with oxygen at night. - Monitor daytime oxygen levels and wean off oxygen if levels remain above 88-90%.  General Health Maintenance Smoke-free for eight to ten years. Concerns about oral hygiene and aspiration pneumonia. Discussed importance of proper oral care before using BiPAP to prevent aspiration pneumonia. - Ensure proper oral care before using BiPAP.  Follow-up - Schedule follow-up appointment in six months. - Ensure nursing home staff checks oxygen levels every four hours, including at night.   Follow-up in 6 months  Recommendations: CT scan follow-up Continue Eliquis, BiPAP, supplemental oxygen  Elison Worrel  Dasie Chancellor MD [facility] 07/27/2023, 2:45 PM  CC: No ref. provider found

## 2023-07-27 NOTE — Patient Instructions (Addendum)
 VISIT SUMMARY:  Today, we discussed your recent pneumonia diagnosis and your overall health. You have shown significant improvement after antibiotic treatment for pneumonia. We also reviewed your history of pulmonary embolism, congestive heart failure, and sleep apnea, and discussed your current treatments and future plans.  YOUR PLAN:  -PNEUMONIA: Pneumonia is an infection that inflames the air sacs in one or both lungs. You responded well to antibiotics, and we will do a follow-up CT scan in six months to monitor any residual scarring and ensure continued improvement.  -CONGESTIVE HEART FAILURE (CHF): Congestive heart failure is a condition where the heart doesn't pump blood as well as it should. We will continue to monitor your symptoms and adjust your treatment as necessary.  -PULMONARY EMBOLISM (PE): A pulmonary embolism is a blockage in one of the pulmonary arteries in your lungs. You are currently on long-term Eliquis, and we will reassess the dosage in six to eight months to balance the risk of clot recurrence and bleeding.  -SLEEP APNEA: Sleep apnea is a sleep disorder where breathing repeatedly stops and starts. You should continue using BiPAP with oxygen at night, and we will monitor your daytime oxygen levels to see if you can wean off oxygen if levels remain above 88-90%.  -GENERAL HEALTH MAINTENANCE: You have been smoke-free for eight to ten years, which is excellent. We discussed the importance of proper oral care before using BiPAP to prevent aspiration pneumonia.  INSTRUCTIONS:  Please schedule a follow-up appointment in six months. Ensure that the nursing home staff checks your oxygen levels every four hours, including at night.

## 2024-01-25 ENCOUNTER — Telehealth (HOSPITAL_BASED_OUTPATIENT_CLINIC_OR_DEPARTMENT_OTHER): Payer: Self-pay

## 2024-01-25 NOTE — Telephone Encounter (Unsigned)
 Copied from CRM [id number]. Topic: Appointments - Scheduling Inquiry for Clinic >> Jan 25, 2024 10:56 AM Ismael A wrote: Reason for CRM: patient has chest CT scan w/out contrast order sent to [facility] per patient's daughter's request, patient needs hoyer lift and is unable to have CT done at [facility] per [place] from [facility] - [phone]

## 2024-01-27 NOTE — Telephone Encounter (Signed)
 spoke with patient's DPR Warren Collum. Patient's dpr is aware it's at 02/02/24 at 4pm. She will be calling central scheduling to get a new appointment.

## 2024-02-02 ENCOUNTER — Ambulatory Visit (HOSPITAL_COMMUNITY): Attending: Pulmonary Disease
